# Patient Record
Sex: Female | Born: 1935 | Race: White | Hispanic: No | Smoking: Never smoker
Health system: Southern US, Community
[De-identification: ages and names within clinical notes are randomized; demographics above are authoritative.]

## PROBLEM LIST (undated history)

## (undated) DIAGNOSIS — I10 Essential (primary) hypertension: Secondary | ICD-10-CM

## (undated) DIAGNOSIS — I2510 Atherosclerotic heart disease of native coronary artery without angina pectoris: Secondary | ICD-10-CM

## (undated) DIAGNOSIS — N289 Disorder of kidney and ureter, unspecified: Secondary | ICD-10-CM

## (undated) DIAGNOSIS — E785 Hyperlipidemia, unspecified: Secondary | ICD-10-CM

## (undated) HISTORY — PX: STENT PLACEMENT RT URETER (ARMC HX): HXRAD1255

## (undated) HISTORY — PX: FRACTURE SURGERY: SHX138

## (undated) HISTORY — PX: CORONARY STENT PLACEMENT: SHX1402

---

## 2012-11-12 DIAGNOSIS — N3941 Urge incontinence: Secondary | ICD-10-CM | POA: Insufficient documentation

## 2012-11-12 DIAGNOSIS — N390 Urinary tract infection, site not specified: Secondary | ICD-10-CM | POA: Insufficient documentation

## 2012-11-12 DIAGNOSIS — N200 Calculus of kidney: Secondary | ICD-10-CM | POA: Insufficient documentation

## 2014-09-19 ENCOUNTER — Inpatient Hospital Stay
Admission: EM | Admit: 2014-09-19 | Discharge: 2014-09-25 | DRG: 871 | Disposition: A | Discharge status: Home / Self Care | Payer: Medicare Other | Attending: Internal Medicine | Admitting: Internal Medicine

## 2014-09-19 ENCOUNTER — Encounter: Payer: Self-pay | Admitting: Internal Medicine

## 2014-09-19 ENCOUNTER — Emergency Department: Payer: Medicare Other

## 2014-09-19 DIAGNOSIS — N3000 Acute cystitis without hematuria: Secondary | ICD-10-CM | POA: Diagnosis present

## 2014-09-19 DIAGNOSIS — D693 Immune thrombocytopenic purpura: Secondary | ICD-10-CM | POA: Diagnosis present

## 2014-09-19 DIAGNOSIS — D61818 Other pancytopenia: Secondary | ICD-10-CM | POA: Diagnosis present

## 2014-09-19 DIAGNOSIS — E871 Hypo-osmolality and hyponatremia: Secondary | ICD-10-CM | POA: Diagnosis present

## 2014-09-19 DIAGNOSIS — E0590 Thyrotoxicosis, unspecified without thyrotoxic crisis or storm: Secondary | ICD-10-CM | POA: Diagnosis present

## 2014-09-19 DIAGNOSIS — R350 Frequency of micturition: Secondary | ICD-10-CM | POA: Diagnosis not present

## 2014-09-19 DIAGNOSIS — I2510 Atherosclerotic heart disease of native coronary artery without angina pectoris: Secondary | ICD-10-CM | POA: Diagnosis present

## 2014-09-19 DIAGNOSIS — R0600 Dyspnea, unspecified: Secondary | ICD-10-CM

## 2014-09-19 DIAGNOSIS — I10 Essential (primary) hypertension: Secondary | ICD-10-CM | POA: Diagnosis present

## 2014-09-19 DIAGNOSIS — N170 Acute kidney failure with tubular necrosis: Secondary | ICD-10-CM | POA: Diagnosis present

## 2014-09-19 DIAGNOSIS — Z880 Allergy status to penicillin: Secondary | ICD-10-CM

## 2014-09-19 DIAGNOSIS — D696 Thrombocytopenia, unspecified: Secondary | ICD-10-CM

## 2014-09-19 DIAGNOSIS — E876 Hypokalemia: Secondary | ICD-10-CM | POA: Diagnosis present

## 2014-09-19 DIAGNOSIS — A419 Sepsis, unspecified organism: Secondary | ICD-10-CM | POA: Diagnosis present

## 2014-09-19 DIAGNOSIS — I5033 Acute on chronic diastolic (congestive) heart failure: Secondary | ICD-10-CM | POA: Diagnosis not present

## 2014-09-19 DIAGNOSIS — Z955 Presence of coronary angioplasty implant and graft: Secondary | ICD-10-CM | POA: Diagnosis not present

## 2014-09-19 DIAGNOSIS — I351 Nonrheumatic aortic (valve) insufficiency: Secondary | ICD-10-CM | POA: Diagnosis not present

## 2014-09-19 DIAGNOSIS — E861 Hypovolemia: Secondary | ICD-10-CM | POA: Diagnosis present

## 2014-09-19 DIAGNOSIS — N179 Acute kidney failure, unspecified: Secondary | ICD-10-CM

## 2014-09-19 DIAGNOSIS — Z87440 Personal history of urinary (tract) infections: Secondary | ICD-10-CM

## 2014-09-19 DIAGNOSIS — N390 Urinary tract infection, site not specified: Secondary | ICD-10-CM | POA: Diagnosis present

## 2014-09-19 DIAGNOSIS — J80 Acute respiratory distress syndrome: Secondary | ICD-10-CM | POA: Diagnosis not present

## 2014-09-19 DIAGNOSIS — R509 Fever, unspecified: Secondary | ICD-10-CM | POA: Diagnosis not present

## 2014-09-19 DIAGNOSIS — I5031 Acute diastolic (congestive) heart failure: Secondary | ICD-10-CM

## 2014-09-19 HISTORY — DX: Essential (primary) hypertension: I10

## 2014-09-19 HISTORY — DX: Atherosclerotic heart disease of native coronary artery without angina pectoris: I25.10

## 2014-09-19 LAB — CBC WITH DIFFERENTIAL/PLATELET
Basophils Absolute: 0 10*3/uL (ref 0–0.1)
EOS ABS: 0 10*3/uL (ref 0–0.7)
Eosinophils Relative: 0 %
HCT: 36 % (ref 35.0–47.0)
Hemoglobin: 12.2 g/dL (ref 12.0–16.0)
LYMPHS ABS: 0.2 10*3/uL — AB (ref 1.0–3.6)
Lymphocytes Relative: 8 %
MCH: 32.5 pg (ref 26.0–34.0)
MCHC: 33.9 g/dL (ref 32.0–36.0)
MCV: 96.1 fL (ref 80.0–100.0)
MONO ABS: 0.2 10*3/uL (ref 0.2–0.9)
Neutro Abs: 2.3 10*3/uL (ref 1.4–6.5)
Platelets: 70 10*3/uL — ABNORMAL LOW (ref 150–440)
RBC: 3.75 MIL/uL — AB (ref 3.80–5.20)
RDW: 14.6 % — ABNORMAL HIGH (ref 11.5–14.5)
WBC: 2.7 10*3/uL — ABNORMAL LOW (ref 3.6–11.0)

## 2014-09-19 LAB — COMPREHENSIVE METABOLIC PANEL
ALT: 65 U/L — AB (ref 14–54)
AST: 109 U/L — ABNORMAL HIGH (ref 15–41)
Albumin: 3.5 g/dL (ref 3.5–5.0)
Alkaline Phosphatase: 69 U/L (ref 38–126)
Anion gap: 8 (ref 5–15)
BUN: 40 mg/dL — ABNORMAL HIGH (ref 6–20)
CO2: 25 mmol/L (ref 22–32)
Calcium: 8.7 mg/dL — ABNORMAL LOW (ref 8.9–10.3)
Chloride: 95 mmol/L — ABNORMAL LOW (ref 101–111)
Creatinine, Ser: 1.6 mg/dL — ABNORMAL HIGH (ref 0.44–1.00)
GFR calc Af Amer: 30 mL/min — ABNORMAL LOW (ref >60)
GFR calc non Af Amer: 26 mL/min — ABNORMAL LOW (ref >60)
GLUCOSE: 116 mg/dL — AB (ref 65–99)
POTASSIUM: 4.4 mmol/L (ref 3.5–5.1)
SODIUM: 128 mmol/L — AB (ref 135–145)
Total Bilirubin: 0.6 mg/dL (ref 0.3–1.2)
Total Protein: 6.8 g/dL (ref 6.5–8.1)

## 2014-09-19 LAB — URINALYSIS COMPLETE WITH MICROSCOPIC (ARMC ONLY)
Bilirubin Urine: NEGATIVE
Glucose, UA: NEGATIVE mg/dL
KETONES UR: NEGATIVE mg/dL
NITRITE: NEGATIVE
PROTEIN: 100 mg/dL — AB
SPECIFIC GRAVITY, URINE: 1.013 (ref 1.005–1.030)
pH: 6 (ref 5.0–8.0)

## 2014-09-19 MED ORDER — SODIUM CHLORIDE 0.9 % IV BOLUS (SEPSIS)
1000.0000 mL | Freq: Once | INTRAVENOUS | Status: AC
  Administered 2014-09-19: 1000 mL via INTRAVENOUS

## 2014-09-19 MED ORDER — CEFTRIAXONE SODIUM IN DEXTROSE 20 MG/ML IV SOLN
1.0000 g | INTRAVENOUS | Status: DC
  Administered 2014-09-19: 1 g via INTRAVENOUS
  Filled 2014-09-19: qty 50

## 2014-09-19 MED ORDER — ONDANSETRON HCL 4 MG/2ML IJ SOLN: 4.0000 mg | Freq: Four times a day (QID) | INTRAMUSCULAR | Status: DC | PRN

## 2014-09-19 MED ORDER — ACETAMINOPHEN 650 MG RE SUPP: 650.0000 mg | Freq: Four times a day (QID) | RECTAL | Status: DC | PRN

## 2014-09-19 MED ORDER — CEFTRIAXONE SODIUM IN DEXTROSE 20 MG/ML IV SOLN
INTRAVENOUS | Status: AC
  Filled 2014-09-19: qty 50

## 2014-09-19 MED ORDER — ACETAMINOPHEN 325 MG PO TABS
ORAL_TABLET | ORAL | Status: AC
  Administered 2014-09-19: 650 mg via ORAL
  Filled 2014-09-19: qty 2

## 2014-09-19 MED ORDER — SODIUM CHLORIDE 0.9 % IV BOLUS (SEPSIS)
1000.0000 mL | INTRAVENOUS | Status: AC
  Administered 2014-09-19: 1000 mL via INTRAVENOUS

## 2014-09-19 MED ORDER — MORPHINE SULFATE 2 MG/ML IJ SOLN: 2.0000 mg | INTRAMUSCULAR | Status: DC | PRN

## 2014-09-19 MED ORDER — ACETAMINOPHEN 325 MG PO TABS
650.0000 mg | ORAL_TABLET | Freq: Once | ORAL | Status: AC
  Administered 2014-09-19: 650 mg via ORAL

## 2014-09-19 MED ORDER — CEFTRIAXONE SODIUM IN DEXTROSE 40 MG/ML IV SOLN
2.0000 g | Freq: Once | INTRAVENOUS | Status: AC
  Administered 2014-09-19: 2 g via INTRAVENOUS

## 2014-09-19 MED ORDER — ACETAMINOPHEN 325 MG PO TABS
650.0000 mg | ORAL_TABLET | Freq: Four times a day (QID) | ORAL | Status: DC | PRN
  Administered 2014-09-20: 650 mg via ORAL
  Administered 2014-09-22: 14:00:00 325 mg via ORAL
  Filled 2014-09-19: qty 2

## 2014-09-19 MED ORDER — SODIUM CHLORIDE 0.9 % IV SOLN
INTRAVENOUS | Status: DC
  Administered 2014-09-20 – 2014-09-21 (×4): via INTRAVENOUS

## 2014-09-19 MED ORDER — HEPARIN SODIUM (PORCINE) 5000 UNIT/ML IJ SOLN: 5000.0000 [IU] | Freq: Three times a day (TID) | INTRAMUSCULAR | Status: DC

## 2014-09-19 MED ORDER — ONDANSETRON HCL 4 MG PO TABS: 4.0000 mg | ORAL_TABLET | Freq: Four times a day (QID) | ORAL | Status: DC | PRN

## 2014-09-19 MED ORDER — CEFUROXIME AXETIL 500 MG PO TABS: 500.0000 mg | ORAL_TABLET | Freq: Two times a day (BID) | ORAL | Status: DC

## 2014-09-19 MED ORDER — CEFTRIAXONE SODIUM IN DEXTROSE 40 MG/ML IV SOLN
2.0000 g | INTRAVENOUS | Status: DC
  Filled 2014-09-19: qty 50

## 2014-09-19 NOTE — ED Notes (Signed)
Son reports that yesterday patient "collapsed" reports checked by ems and vital signs were good so did not get checked out.  Today pt "collapsed" again and increased weakness.  Pt with history of chronic bladder infections.

## 2014-09-19 NOTE — H&P (Signed)
[facility] at [place] NAME: Sandy Roberson    MR#:  [id number]  DATE OF BIRTH:  [birth year]   DATE OF ADMISSION:  09/19/2014  PRIMARY CARE PHYSICIAN: ZUST, DON L, DO   REQUESTING/REFERRING PHYSICIAN: [place]  CHIEF COMPLAINT:   Chief Complaint  Patient presents with  . Near Syncope    HISTORY OF PRESENT ILLNESS:  Sandy Roberson  is a 81 y.o. female with a known history of coronary artery disease status post PCI stent placement presenting with weakness. He describes three-day duration weakness with associated subjective fever/chills, increased urinary frequency. On arrival to emergency department noted to be febrile MAXIMUM TEMPERATURE 103. Emergency department course: Code sepsis initiated  PAST MEDICAL HISTORY:   Past Medical History  Diagnosis Date  . Coronary artery disease   . Hypertension     PAST SURGICAL HISTORY:  History reviewed. No pertinent past surgical history.  SOCIAL HISTORY:   History  Substance Use Topics  . Smoking status: Never Smoker   . Smokeless tobacco: Not on file  . Alcohol Use: No    FAMILY HISTORY:   Family History  Problem Relation Age of Onset  . Heart failure Neg Hx     DRUG ALLERGIES:   Allergies  Allergen Reactions  . Penicillins Rash    REVIEW OF SYSTEMS:  REVIEW OF SYSTEMS:  CONSTITUTIONAL: Positive fevers, chills, fatigue, weakness.  EYES: Denies blurred vision, double vision, or eye pain.  EARS, NOSE, THROAT: Denies tinnitus, ear pain, hearing loss.  RESPIRATORY: denies cough, shortness of breath, wheezing  CARDIOVASCULAR: Denies chest pain, palpitations, edema.  GASTROINTESTINAL: Denies nausea, vomiting, diarrhea, abdominal pain.  GENITOURINARY: Denies dysuria, hematuria. Positive increased urinary frequency ENDOCRINE: Denies nocturia or thyroid problems. HEMATOLOGIC AND LYMPHATIC: Denies easy bruising or bleeding.  SKIN: Denies rash or lesions.   MUSCULOSKELETAL: Denies pain in neck, back, shoulder, knees, hips, or further arthritic symptoms.  NEUROLOGIC: Denies paralysis, paresthesias.  PSYCHIATRIC: Denies anxiety or depressive symptoms. Otherwise full review of systems performed by me is negative.   MEDICATIONS AT HOME:   Prior to Admission medications   Medication Sig Start Date End Date Taking? Authorizing Provider  calcitRIOL (ROCALTROL) 0.25 MCG capsule Take 1 capsule by mouth daily. 10/20/12  Yes Historical Provider, MD  nebivolol (BYSTOLIC) 10 MG tablet Take 10 mg by mouth daily. 08/25/14  Yes Historical Provider, MD  aspirin EC 81 MG tablet Take 1 tablet by mouth daily.    Historical Provider, MD  losartan (COZAAR) 100 MG tablet Take 1 tablet by mouth daily.    Historical Provider, MD  methimazole (TAPAZOLE) 5 MG tablet Take 1 tablet by mouth daily.    Historical Provider, MD  simvastatin (ZOCOR) 40 MG tablet Take 1 tablet by mouth at bedtime.    Historical Provider, MD      VITAL SIGNS:  Blood pressure 83/44, pulse 67, temperature 98.2 F (36.8 C), temperature source Oral, resp. rate 18, height 5\' 2"  (1.575 m), weight 131 lb 9 oz (59.676 kg), SpO2 92 %.  PHYSICAL EXAMINATION:  VITAL SIGNS: Filed Vitals:   09/19/14 2317  BP: 83/44  Pulse: 67  Temp: 98.2 F (36.8 C)  Resp: 18   GENERAL:81 y.o.female currently in no acute distress.  HEAD: Normocephalic, atraumatic.  EYES: Pupils equal, round, reactive to light. Extraocular muscles intact. No scleral icterus.  MOUTH: Moist mucosal membrane. Dentition intact. No abscess noted.  EAR, NOSE, THROAT: Clear without exudates. No external lesions.  NECK: Supple.  No thyromegaly. No nodules. No JVD.  PULMONARY: Clear to ascultation, without wheeze rails or rhonci. No use of accessory muscles, Good respiratory effort. good air entry bilaterally CHEST: Nontender to palpation.  CARDIOVASCULAR: S1 and S2. Regular rate and rhythm. No murmurs, rubs, or gallops. No edema. Pedal  pulses 2+ bilaterally.  GASTROINTESTINAL: Soft, nontender, nondistended. No masses. Positive bowel sounds. No hepatosplenomegaly.  MUSCULOSKELETAL: No swelling, clubbing, or edema. Range of motion full in all extremities.  NEUROLOGIC: Cranial nerves II through XII are intact. No gross focal neurological deficits. Sensation intact. Reflexes intact.  SKIN: No ulceration, lesions, rashes, or cyanosis. Skin warm and dry. Turgor intact.  PSYCHIATRIC: Mood, affect within normal limits. The patient is awake, alert and oriented x 3. Insight, judgment intact.    LABORATORY PANEL:   CBC  Recent Labs Lab 09/19/14 1935  WBC 2.7*  HGB 12.2  HCT 36.0  PLT 70*   ------------------------------------------------------------------------------------------------------------------  Chemistries   Recent Labs Lab 09/19/14 1935  NA 128*  K 4.4  CL 95*  CO2 25  GLUCOSE 116*  BUN 40*  CREATININE 1.60*  CALCIUM 8.7*  AST 109*  ALT 65*  ALKPHOS 69  BILITOT 0.6   ------------------------------------------------------------------------------------------------------------------  Cardiac Enzymes No results for input(s): TROPONINI in the last 168 hours. ------------------------------------------------------------------------------------------------------------------  RADIOLOGY:  Dg Chest Port 1 View  09/19/2014   CLINICAL DATA:  Confusion and weakness.  EXAM: PORTABLE CHEST - 1 VIEW  COMPARISON:  None.  FINDINGS: The heart is enlarged. There is tortuosity and ectasia of the thoracic aorta. The upper mediastinal contours are quite widened and there is a deviation of the trachea right would. Findings most likely due to thyroid goiter. Recommend correlation with clinical findings. The lungs are clear. No pleural effusion. The bony thorax is intact.  IMPRESSION: Cardiac enlargement.  No acute pulmonary findings.  Probable thyroid goiter.   Electronically Signed   By: Marijo Sanes M.D.   On: 09/19/2014  20:39    EKG:  No orders found for this or any previous visit.  IMPRESSION AND PLAN:   81 year old Caucasian female history of coronary artery disease status post PCI stent placement presenting with generalized weakness 2 days total duration.  1.Sepsis, meeting septic criteria by temperature, the wbc present on arrival. Source urinary Code sepsis initiated. Panculture. Broad-spectrum antibiotics including ceftriaxone and taper antibiotics when culture data returns. He has received a 30 mL/kg IV fluid bolus. Continue IV fluid hydration to keep mean arterial pressure greater than 65. sHe may require pressor therapy if blood pressure worsens. We will repeat lactic acid given the initial is greater than 2.2.   2. Hyponatremia: IV fluid hydration with normal saline follow sodium levels, prioritize blood pressure over sodium 3. Essential hypertension: Continue losartan by systolic 4. Coronary artery disease without angina: Continue aspirin losartan Zocor 5.Venous thromboembolism prophylactic: SCD      All the records are reviewed and case discussed with ED provider. Management plans discussed with the patient, family and they are in agreement.  CODE STATUS: Full  TOTAL TIME TAKING CARE OF THIS PATIENT: 45 minutes.    Arthella Headings,  Karenann Cai.D on 09/19/2014 at 11:38 PM  Between 7am to 6pm - Pager - [phone]  After 6pm: House Pager: - [phone]  [facility]  Office  [phone]  CC: Primary care physician; ZUST, DON L, DO

## 2014-09-19 NOTE — ED Provider Notes (Signed)
[facility] Emergency Department Provider Note  ____________________________________________  Time seen: Approximately 8:15 PM  I have reviewed the triage vital signs and the nursing notes.   HISTORY  Chief Complaint Near Syncope    HPI Sandy Roberson is a 81 y.o. female with a history of urinary tract infections who presents today with fever today. Patient has had weakness for the past 2 days and per her son passed out yesterday in his arms. She has not fallen or hit her head. She's had similar presentations with urinary tract infections in the past. She does wear a depends. He is not complaining of any pain or urinary frequency at this time. Also denying cough, chest pain and shortness of breath.Does have a history of sepsis from UTI.   No past medical history on file.  There are no active problems to display for this patient.   No past surgical history on file.  No current outpatient prescriptions on file.  Allergies Penicillins  No family history on file.  Social History History  Substance Use Topics  . Smoking status: Not on file  . Smokeless tobacco: Not on file  . Alcohol Use: Not on file    Review of Systems Constitutional: No fever/chills Eyes: No visual changes. ENT: No sore throat. Cardiovascular: Denies chest pain. Respiratory: Denies shortness of breath. Gastrointestinal: No abdominal pain.  No nausea, no vomiting.  No diarrhea.  No constipation. Genitourinary: Negative for dysuria. Musculoskeletal: Negative for back pain. Skin: Negative for rash. Neurological: Negative for headaches, focal weakness or numbness.  10-point ROS otherwise negative.  ____________________________________________   PHYSICAL EXAM:  VITAL SIGNS: ED Triage Vitals  Enc Vitals Group     BP 09/19/14 1920 118/83 mmHg     Pulse Rate 09/19/14 1920 87     Resp 09/19/14 1920 18     Temp 09/19/14 1920 101.9 F (38.8 C)     Temp Source  09/19/14 1920 Oral     SpO2 09/19/14 1920 91 %     Weight 09/19/14 1920 112 lb (50.803 kg)     Height 09/19/14 1920 5\' 2"  (1.575 m)     Head Cir --      Peak Flow --      Pain Score --      Pain Loc --      Pain Edu? --      Excl. in [place]? --     Constitutional: Alert and oriented. Well appearing and in no acute distress. Eyes: Conjunctivae are normal. PERRL. EOMI. Head: Atraumatic. Nose: No congestion/rhinnorhea. Mouth/Throat: Mucous membranes are moist.  Oropharynx non-erythematous. Neck: No stridor.   Cardiovascular: Normal rate, regular rhythm. Grossly normal heart sounds.  Good peripheral circulation. Respiratory: Normal respiratory effort.  No retractions. Lungs CTAB. Gastrointestinal: Soft and nontender. No distention. No abdominal bruits. No CVA tenderness. Musculoskeletal: No lower extremity tenderness nor edema.  No joint effusions. Neurologic:  Normal speech and language. No gross focal neurologic deficits are appreciated. Speech is normal. No gait instability. Skin:  Skin is warm, dry and intact. No rash noted. Psychiatric: Mood and affect are normal. Speech and behavior are normal.  ____________________________________________   LABS (all labs ordered are listed, but only abnormal results are displayed)  Labs Reviewed  CBC WITH DIFFERENTIAL/PLATELET - Abnormal; Notable for the following:    WBC 2.7 (*)    RBC 3.75 (*)    RDW 14.6 (*)    Platelets 70 (*)    Lymphs Abs 0.2 (*)  All other components within normal limits  COMPREHENSIVE METABOLIC PANEL - Abnormal; Notable for the following:    Sodium 128 (*)    Chloride 95 (*)    Glucose, Bld 116 (*)    BUN 40 (*)    Creatinine, Ser 1.60 (*)    Calcium 8.7 (*)    AST 109 (*)    ALT 65 (*)    GFR calc non Af Amer 26 (*)    GFR calc Af Amer 30 (*)    All other components within normal limits  URINALYSIS COMPLETEWITH MICROSCOPIC ([facility] ONLY) - Abnormal; Notable for the following:    Color, Urine YELLOW (*)     APPearance HAZY (*)    Hgb urine dipstick 2+ (*)    Protein, ur 100 (*)    Leukocytes, UA 2+ (*)    Bacteria, UA MANY (*)    Squamous Epithelial / LPF 0-5 (*)    All other components within normal limits  CULTURE, BLOOD (ROUTINE X 2)  CULTURE, BLOOD (ROUTINE X 2)  URINE CULTURE   ____________________________________________  EKG  ED ECG REPORT I, Doran Stabler, the attending physician, personally viewed and interpreted this ECG.   Date: 09/19/2014  EKG Time: 2119  Rate: 75  Rhythm: normal EKG, normal sinus rhythm  Axis: Noble axis  Intervals:none  ST&T Change: No ST elevation or depressions. No abnormal T-wave inversions.  ____________________________________________  RADIOLOGY  No acute pulmonary findings ____________________________________________   PROCEDURES    ____________________________________________   INITIAL IMPRESSION / ASSESSMENT AND PLAN / ED COURSE  Pertinent labs & imaging results that were available during my care of the patient were reviewed by me and considered in my medical decision making (see chart for details).  ----------------------------------------- 10:29 PM on 09/19/2014 -----------------------------------------  Asian fever improving but blood pressure now decreased to the 80s. Patient still resting comfortably. Concerned about sepsis. Made patient a sepsis alert and will give IV antibiotics. We'll image the hospital. Signed out to Dr. Lavetta Nielsen at this time. ____________________________________________   FINAL CLINICAL IMPRESSION(S) / ED DIAGNOSES  Urinary tract infection. Sepsis. Acute, return visit.    Orbie Pyo, MD 09/19/14 2229

## 2014-09-19 NOTE — ED Notes (Signed)
Pt's blood pressure has been steadily dropping. Her temp is down. Not tachycardic. Dr is changing her back to a sepsis protocol and will be admitting her.

## 2014-09-20 LAB — BASIC METABOLIC PANEL
ANION GAP: 4 — AB (ref 5–15)
BUN: 34 mg/dL — ABNORMAL HIGH (ref 6–20)
CHLORIDE: 106 mmol/L (ref 101–111)
CO2: 25 mmol/L (ref 22–32)
CREATININE: 1.52 mg/dL — AB (ref 0.44–1.00)
Calcium: 7.6 mg/dL — ABNORMAL LOW (ref 8.9–10.3)
GFR calc non Af Amer: 28 mL/min — ABNORMAL LOW (ref >60)
GFR, EST AFRICAN AMERICAN: 32 mL/min — AB (ref >60)
GLUCOSE: 91 mg/dL (ref 65–99)
POTASSIUM: 4.2 mmol/L (ref 3.5–5.1)
Sodium: 135 mmol/L (ref 135–145)

## 2014-09-20 LAB — CBC
HCT: 33.2 % — ABNORMAL LOW (ref 35.0–47.0)
HEMOGLOBIN: 11 g/dL — AB (ref 12.0–16.0)
MCH: 32 pg (ref 26.0–34.0)
MCHC: 33.1 g/dL (ref 32.0–36.0)
MCV: 96.8 fL (ref 80.0–100.0)
PLATELETS: 54 10*3/uL — AB (ref 150–440)
RBC: 3.43 MIL/uL — ABNORMAL LOW (ref 3.80–5.20)
RDW: 14.5 % (ref 11.5–14.5)
WBC: 1.9 10*3/uL — ABNORMAL LOW (ref 3.6–11.0)

## 2014-09-20 LAB — T4, FREE: FREE T4: 0.99 ng/dL (ref 0.61–1.12)

## 2014-09-20 LAB — TSH: TSH: 1.951 u[IU]/mL (ref 0.350–4.500)

## 2014-09-20 MED ORDER — ASPIRIN EC 81 MG PO TBEC
81.0000 mg | DELAYED_RELEASE_TABLET | Freq: Every day | ORAL | Status: DC
  Administered 2014-09-20 – 2014-09-21 (×2): 81 mg via ORAL
  Filled 2014-09-20 (×2): qty 1

## 2014-09-20 MED ORDER — METHIMAZOLE 10 MG PO TABS
5.0000 mg | ORAL_TABLET | Freq: Every day | ORAL | Status: DC
  Administered 2014-09-20 – 2014-09-23 (×4): 5 mg via ORAL
  Administered 2014-09-24: 10 mg via ORAL
  Administered 2014-09-25: 5 mg via ORAL
  Filled 2014-09-20 (×6): qty 1

## 2014-09-20 MED ORDER — CALCITRIOL 0.25 MCG PO CAPS
0.2500 ug | ORAL_CAPSULE | Freq: Every day | ORAL | Status: DC
  Administered 2014-09-20 – 2014-09-25 (×6): 0.25 ug via ORAL
  Filled 2014-09-20 (×6): qty 1

## 2014-09-20 MED ORDER — SIMVASTATIN 40 MG PO TABS
40.0000 mg | ORAL_TABLET | Freq: Every day | ORAL | Status: DC
  Administered 2014-09-20 – 2014-09-24 (×5): 40 mg via ORAL
  Filled 2014-09-20 (×5): qty 1

## 2014-09-20 MED ORDER — IBUPROFEN 600 MG PO TABS
600.0000 mg | ORAL_TABLET | Freq: Four times a day (QID) | ORAL | Status: DC | PRN
  Administered 2014-09-20: 19:00:00 600 mg via ORAL
  Filled 2014-09-20: qty 1

## 2014-09-20 MED ORDER — CEFTRIAXONE SODIUM IN DEXTROSE 20 MG/ML IV SOLN
1.0000 g | INTRAVENOUS | Status: DC
  Administered 2014-09-20 – 2014-09-21 (×2): 1 g via INTRAVENOUS
  Filled 2014-09-20 (×3): qty 50

## 2014-09-20 NOTE — Progress Notes (Signed)
Pt transferred to 1C, rm 101, report called to [facility].  Pt alert and oriented, no c/o pain or SOB.  All belongings and chart sent w/pt.

## 2014-09-20 NOTE — Progress Notes (Signed)
On call MD notified of pt BP 93/40 with IVF going at 171mL/hr. No new orders received. Will continue to assess.   Hiram Gash [employer]

## 2014-09-20 NOTE — Progress Notes (Signed)
ANTIBIOTIC CONSULT NOTE - INITIAL  Pharmacy Consult for ceftriaxone dosing Indication: UTI  Allergies  Allergen Reactions  . Penicillins Rash    Patient Measurements: Height: 5\' 2"  (157.5 cm) Weight: 131 lb 9 oz (59.676 kg) IBW/kg (Calculated) : 50.1 Adjusted Body Weight: n/a  Vital Signs: Temp: 98.2 F (36.8 C) (06/05 2317) Temp Source: Oral (06/05 2317) BP: 83/44 mmHg (06/05 2317) Pulse Rate: 67 (06/05 2317) Intake/Output from previous day:   Intake/Output from this shift:    Labs:  Recent Labs  09/19/14 1935  WBC 2.7*  HGB 12.2  PLT 70*  CREATININE 1.60*   Estimated Creatinine Clearance: 15.9 mL/min (by C-G formula based on Cr of 1.6). No results for input(s): VANCOTROUGH, VANCOPEAK, VANCORANDOM, GENTTROUGH, GENTPEAK, GENTRANDOM, TOBRATROUGH, TOBRAPEAK, TOBRARND, AMIKACINPEAK, AMIKACINTROU, AMIKACIN in the last 72 hours.   Microbiology: No results found for this or any previous visit (from the past 720 hour(s)).  Medical History: Past Medical History  Diagnosis Date  . Coronary artery disease   . Hypertension     Medications:   Assessment: Blood and urine cx pending UA: NO2(-)  LE(+)  WBC TNTC  Goal of Therapy:  Resolution of infection   Plan:  Ceftriaxone 2 grams q 24 hours ordered.  Hyla Coard S 09/20/2014,12:11 AM

## 2014-09-20 NOTE — Progress Notes (Signed)
[facility] at [place] NAME: Sandy Roberson    MR#:  [id number]  DATE OF BIRTH:  Sep 18, 1915  SUBJECTIVE:  CHIEF COMPLAINT:   Chief Complaint  Patient presents with  . Near Syncope  - and admitted for sepsis, likely secondary to urinary tract infection. -Was hypotensive on admission, received IV fluids. Blood pressure is much better. Hasn't required any vasopressors. -Patient is alert and oriented. Low-grade fevers this morning.  REVIEW OF SYSTEMS:  Review of Systems  Constitutional: Positive for fever. Negative for chills.  Respiratory: Negative for cough, shortness of breath and wheezing.   Cardiovascular: Negative for chest pain and palpitations.  Gastrointestinal: Negative for nausea, vomiting, abdominal pain, diarrhea and constipation.  Genitourinary: Negative for dysuria.  Neurological: Negative for dizziness, seizures and headaches.    DRUG ALLERGIES:   Allergies  Allergen Reactions  . Penicillins Rash    VITALS:  Blood pressure 121/44, pulse 76, temperature 100.5 F (38.1 C), temperature source Oral, resp. rate 18, height 5\' 2"  (1.575 m), weight 59.676 kg (131 lb 9 oz), SpO2 94 %.  PHYSICAL EXAMINATION:  Physical Exam  GENERAL:  81 y.o.-year-old patient lying in the bed with no acute distress.  EYES: Pupils equal, round, reactive to light and accommodation. No scleral icterus. Extraocular muscles intact.  HEENT: Head atraumatic, normocephalic. Oropharynx and nasopharynx clear.  NECK:  Supple, no jugular venous distention. No thyroid enlargement, no tenderness.  LUNGS: Normal breath sounds bilaterally, decreased bibasilar breath sounds. no wheezing, rales,rhonchi or crepitation. No use of accessory muscles of respiration.  CARDIOVASCULAR: S1, S2 normal. 3/6 systolic murmur is present, no  rubs, or gallops.  ABDOMEN: Soft, nontender, nondistended. Bowel sounds present. No organomegaly or mass.  EXTREMITIES: No  pedal edema, cyanosis, or clubbing.  NEUROLOGIC: Cranial nerves II through XII are intact. Muscle strength 5/5 in all extremities. Sensation intact. Gait not checked.  PSYCHIATRIC: The patient is alert and oriented x 2.  SKIN: No obvious rash, lesion, or ulcer.    LABORATORY PANEL:   CBC  Recent Labs Lab 09/20/14 0646  WBC 1.9*  HGB 11.0*  HCT 33.2*  PLT 54*   ------------------------------------------------------------------------------------------------------------------  Chemistries   Recent Labs Lab 09/19/14 1935 09/20/14 0646  NA 128* 135  K 4.4 4.2  CL 95* 106  CO2 25 25  GLUCOSE 116* 91  BUN 40* 34*  CREATININE 1.60* 1.52*  CALCIUM 8.7* 7.6*  AST 109*  --   ALT 65*  --   ALKPHOS 69  --   BILITOT 0.6  --    ------------------------------------------------------------------------------------------------------------------  Cardiac Enzymes No results for input(s): TROPONINI in the last 168 hours. ------------------------------------------------------------------------------------------------------------------  RADIOLOGY:  Dg Chest Port 1 View  09/19/2014   CLINICAL DATA:  Confusion and weakness.  EXAM: PORTABLE CHEST - 1 VIEW  COMPARISON:  None.  FINDINGS: The heart is enlarged. There is tortuosity and ectasia of the thoracic aorta. The upper mediastinal contours are quite widened and there is a deviation of the trachea right would. Findings most likely due to thyroid goiter. Recommend correlation with clinical findings. The lungs are clear. No pleural effusion. The bony thorax is intact.  IMPRESSION: Cardiac enlargement.  No acute pulmonary findings.  Probable thyroid goiter.   Electronically Signed   By: Sandy Roberson M.D.   On: 09/19/2014 20:39    EKG:   Orders placed or performed during the hospital encounter of 09/19/14  . EKG 12-Lead  . EKG 12-Lead    ASSESSMENT  AND PLAN:   81 year old female with past medical history significant for coronary artery  disease and hypertension, who lives at home was brought in secondary to weakness and hypotension.  #1 sepsis-likely source acute cystitis. Blood and urine cultures have been ordered. -Continue on Rocephin for now. Still spiking fevers, but only low-grade today. -IV fluids, no pressors needed. -We'll transfer out of ICU today.  #2 hyponatremia secondary to hypovolemia. Improved with IV fluids at this time.  #3 hypertension as-as blood pressure is low, will hold off on giving her home medications. Continue to monitor.  #4 pancytopenia-drop noted mostly in WBC count and also platelets. Could be secondary to sepsis. Not on any heparin products at this time. We'll continue to monitor at this time. No acute indication for any transfusion. No active bleeding noted. Partly dilutional as well.  #5 acute renal failure-no baseline available. Continue to monitor. Could be ATN from sepsis.  #6 Hyperthyroidism-patient on methimazole as outpatient. We'll check TSH and T4.  #7 DVT prophylaxis - TEDs and SCDs.  Physical therapy has been ordered.  All the records are reviewed and case discussed with Care Management/Social Workerr. Management plans discussed with the patient, family and they are in agreement.  CODE STATUS: Full code  TOTAL CRITICAL CARE TIME SPENT IN TAKING CARE OF THIS PATIENT: 37 minutes.   POSSIBLE D/C IN 2 DAYS, DEPENDING ON CLINICAL CONDITION.   Sandy Roberson M.D on 09/20/2014 at 10:47 AM  Between 7am to 6pm - Pager - [phone]  After 6pm go to [email or web address] - password [email or web address] [facility]  Office  [phone]  CC: Primary care physician; Sandy Roberson

## 2014-09-20 NOTE — Plan of Care (Signed)
Problem: Discharge Progression Outcomes Goal: Discharge plan in place and appropriate Outcome: Progressing Individualization: Pt called [facility].  Lives w/son and his girlfriend.  Has chronic issues w/dehydration and bladder infections.  Recent falls r/t hypotension. Pt has Hx of CAD and HTN - controlled by home medications.  Being held currently b/c of hypotension.   Goal: Other Discharge Outcomes/Goals Outcome: Progressing Plan of Care Progress to Goal: Pt transferred today from CCU.  She's very hard of hearing.  High Fall risk b/c of 2 falls in last 2 days r/t hypotension.  Wked w/PT today And using walker.  A&O, not impulsive - will call out for bedpan.  Very poor PO intake.  Pt may need dietary consult. Son would also like order for wheelchair on d/c -

## 2014-09-20 NOTE — Evaluation (Signed)
Physical Therapy Evaluation Patient Details Name: Sandy Roberson MRN: [id number] DOB: 1915-11-30 Today's Date: 09/20/2014   History of Present Illness  Pt is a 81 y.o. female admitted with 2 days of weakness, near syncope and 2 recent falls.  Pt admitted with sepsis and hypotension.  PMH:  CAD s/p PCI stent placement, htn.  Clinical Impression  Currently pt demonstrates impairments with activity tolerance, strength, balance, and limitations with functional mobility.  Prior to admission, pt was fairly independent using standard walker.  Pt lives with her son and son's girlfriend in 1 level home with no steps to enter.  Currently pt is CGA to min assist with transfers and ambulation with walker.  Pt would benefit from skilled PT to address above noted impairments and functional limitations.  Recommend pt discharge to home with 24/7 assist for functional mobility when medically appropriate (pt reports that either her son or son's girlfriend will be able to assist her with functional mobility).  Pt's standard walker appears to have been altered for use and anticipate pt would benefit from RW instead to conserve energy and for balance.     Follow Up Recommendations Home health PT;Supervision/Assistance - 24 hour    Equipment Recommendations  Rolling walker with 5" wheels    Recommendations for Other Services       Precautions / Restrictions Precautions Precautions: Fall Restrictions Weight Bearing Restrictions: No      Mobility  Bed Mobility Overal bed mobility: Needs Assistance Bed Mobility: Supine to Sit;Sit to Supine     Supine to sit: Supervision;HOB elevated Sit to supine: Supervision;HOB elevated   General bed mobility comments: increased time to perform on her own  Transfers Overall transfer level: Needs assistance Equipment used: Standard walker Transfers: Sit to/from Stand Sit to Stand: Min guard;Min assist         General transfer comment: assist to initiate  stand from bed and 2 attempts required to stand successfully  Ambulation/Gait Ambulation/Gait assistance: Min guard;Min assist Ambulation Distance (Feet): 30 Feet Assistive device: Rolling walker (2 wheeled)       General Gait Details: Pt appearing "shaky" with decreased cadence, decreased B step length/foot clearance/heelstrike  Stairs            Wheelchair Mobility    Modified Rankin (Stroke Patients Only)       Balance                                             Pertinent Vitals/Pain Pain Assessment: No/denies pain    Home Living Family/patient expects to be discharged to:: Private residence Living Arrangements: Children (Pt's son and son's girlfriend) Available Help at Discharge: Family   Home Access: Level entry     Home Layout: One level Home Equipment: Environmental consultant - standard;Cane - single point      Prior Function Level of Independence: Independent with assistive device(s)         Comments: uses standard walker at home; has assist for groceries and cleaning; independent with showering and laundry; pt reports 2 recent falls (one d/t slipping and not sure on reason for 2nd fall)     Hand Dominance        Extremity/Trunk Assessment   Upper Extremity Assessment: Generalized weakness           Lower Extremity Assessment: Generalized weakness         Communication  Communication: HOH  Cognition Arousal/Alertness: Awake/alert Behavior During Therapy: WFL for tasks assessed/performed Overall Cognitive Status: Within Functional Limits for tasks assessed                      General Comments  Nursing cleared pt for participation in physical therapy.  Pt agreeable to PT session.    Exercises        Assessment/Plan    PT Assessment Patient needs continued PT services  PT Diagnosis Difficulty walking;Generalized weakness   PT Problem List Decreased strength;Decreased activity tolerance;Decreased  balance;Decreased mobility  PT Treatment Interventions DME instruction;Gait training;Functional mobility training;Therapeutic activities;Therapeutic exercise;Balance training;Patient/family education   PT Goals (Current goals can be found in the Care Plan section) Acute Rehab PT Goals Patient Stated Goal: To go home PT Goal Formulation: With patient Time For Goal Achievement: 10/04/14 Potential to Achieve Goals: Good    Frequency Min 2X/week   Barriers to discharge        Co-evaluation               End of Session Equipment Utilized During Treatment: Gait belt Activity Tolerance: Patient limited by fatigue Patient left: in bed;with call bell/phone within reach;with bed alarm set           Time: [id number] PT Time Calculation (min) (ACUTE ONLY): 28 min   Charges:   PT Evaluation $Initial PT Evaluation Tier I: 1 Procedure     PT G CodesLeitha Bleak 09-26-14, 4:51 PM Leitha Bleak, [place]

## 2014-09-21 ENCOUNTER — Inpatient Hospital Stay: Payer: Medicare Other

## 2014-09-21 LAB — CBC
HEMATOCRIT: 30.2 % — AB (ref 35.0–47.0)
HEMOGLOBIN: 10.2 g/dL — AB (ref 12.0–16.0)
MCH: 32.6 pg (ref 26.0–34.0)
MCHC: 33.8 g/dL (ref 32.0–36.0)
MCV: 96.4 fL (ref 80.0–100.0)
PLATELETS: 41 10*3/uL — AB (ref 150–440)
RBC: 3.14 MIL/uL — ABNORMAL LOW (ref 3.80–5.20)
RDW: 14.6 % — ABNORMAL HIGH (ref 11.5–14.5)
WBC: 2.1 10*3/uL — ABNORMAL LOW (ref 3.6–11.0)

## 2014-09-21 LAB — BASIC METABOLIC PANEL
ANION GAP: 4 — AB (ref 5–15)
BUN: 31 mg/dL — ABNORMAL HIGH (ref 6–20)
CALCIUM: 7.2 mg/dL — AB (ref 8.9–10.3)
CO2: 24 mmol/L (ref 22–32)
Chloride: 109 mmol/L (ref 101–111)
Creatinine, Ser: 1.5 mg/dL — ABNORMAL HIGH (ref 0.44–1.00)
GFR calc Af Amer: 32 mL/min — ABNORMAL LOW (ref >60)
GFR, EST NON AFRICAN AMERICAN: 28 mL/min — AB (ref >60)
Glucose, Bld: 91 mg/dL (ref 65–99)
Potassium: 4 mmol/L (ref 3.5–5.1)
Sodium: 137 mmol/L (ref 135–145)

## 2014-09-21 LAB — URINE CULTURE: Culture: >=100000

## 2014-09-21 MED ORDER — IPRATROPIUM-ALBUTEROL 0.5-2.5 (3) MG/3ML IN SOLN
3.0000 mL | RESPIRATORY_TRACT | Status: DC | PRN
  Administered 2014-09-23 – 2014-09-24 (×2): 3 mL via RESPIRATORY_TRACT
  Filled 2014-09-21 (×2): qty 3

## 2014-09-21 MED ORDER — SODIUM CHLORIDE 0.9 % IV BOLUS (SEPSIS)
500.0000 mL | Freq: Once | INTRAVENOUS | Status: AC
  Administered 2014-09-21: 06:00:00 500 mL via INTRAVENOUS

## 2014-09-21 MED ORDER — HYDROCORTISONE NA SUCCINATE PF 100 MG IJ SOLR
50.0000 mg | Freq: Four times a day (QID) | INTRAMUSCULAR | Status: DC
  Administered 2014-09-21 – 2014-09-22 (×4): 50 mg via INTRAVENOUS
  Filled 2014-09-21 (×4): qty 2

## 2014-09-21 MED ORDER — FUROSEMIDE 10 MG/ML IJ SOLN
20.0000 mg | Freq: Once | INTRAMUSCULAR | Status: AC
  Administered 2014-09-21: 14:00:00 20 mg via INTRAVENOUS
  Filled 2014-09-21: qty 2

## 2014-09-21 NOTE — Progress Notes (Signed)
   09/21/14 0930  Clinical Encounter Type  Visited With Patient  Visit Type Initial  Visited with patient and the nurse.  No family present.  Patient said she was doing ok, but admitted that she is hard of hearing.  She said she hopes to go home soon.  Visit cut short by entry of the doctor who wanted to check on the patient.  I told patient I would try to visit with her later.  Patient and nurse both thanked me for coming.  [place] [phone]

## 2014-09-21 NOTE — Plan of Care (Signed)
Problem: Discharge Progression Outcomes Goal: Other Discharge Outcomes/Goals Outcome: Progressing 1. Pain:      Denies.   2. Hemodynamically:     Hypotensive. NS infusing at 138ml/hr. 3. Complications:      BP stable.      OOB to [facility]. Dyspnea on exertion. O2 sat 81% on RA. Placed on 2L O2 via [place]. O2 sat 93% on 2L O2 via       [place]. Inspiratory/Expiratory wheezing noted. MD paged regarding status. IVFs discontinued. Lasix 20mg       IVP given as ordered. Transported to radiology via bed for C-XRAY. Tolerated well.  4. Diet:     Tolerating Healthy Heart diet. Decreased appetite. 5. Activity:     Walker and 1 person assist.

## 2014-09-21 NOTE — Care Management (Signed)
Admitted to [facility] with the diagnosis of sepsis/urinary tract infection. Lives with son [name], [phone] or [phone]). Sees Dr Remus Loffler. States she has been see by Dr, Remus Loffler in the last year. No home health. No skilled facility. No home oxygen. Uses cane.rolling walker to aid in ambulation. States either her son or girlfriend are in the home at all times.  Physical therapy evaluation completed. Recommends home with home health 24 hour supervision. No home health agency preference. Shelbie Ammons RN MSN Care Management [phone]

## 2014-09-21 NOTE — Progress Notes (Signed)
[place] [facility] [place] NAME: Sandy Roberson    MR#:  [id number]  DATE OF BIRTH:  1915/10/12  SUBJECTIVE:  CHIEF COMPLAINT:   Chief Complaint  Patient presents with  . Near Syncope  - blood pressure remains low normal. The patient is not symptomatic. She did receive 500 cc fluid bolus overnight. This morning after getting out of bed to use bedside commode, she became extremely dyspneic short of breath and had audible wheezing. Currently resting in bed on 1-2 L nasal cannula. -Had a temperature of 10 22F last night  REVIEW OF SYSTEMS:  Review of Systems  Constitutional: Positive for fever. Negative for chills.  Respiratory: Positive for shortness of breath and wheezing. Negative for cough.   Cardiovascular: Negative for chest pain and palpitations.  Gastrointestinal: Negative for nausea, vomiting, abdominal pain, diarrhea and constipation.  Genitourinary: Negative for dysuria.  Neurological: Positive for weakness. Negative for dizziness, seizures and headaches.    DRUG ALLERGIES:   Allergies  Allergen Reactions  . Penicillins Rash    VITALS:  Blood pressure 107/48, pulse 70, temperature 97.8 F (36.6 C), temperature source Oral, resp. rate 18, height 5\' 2"  (1.575 m), weight 59.676 kg (131 lb 9 oz), SpO2 93 %.  PHYSICAL EXAMINATION:  Physical Exam  GENERAL:  81 y.o.-year-old patient lying in the bed with no acute distress.  EYES: Pupils equal, round, reactive to light and accommodation. No scleral icterus. Extraocular muscles intact.  HEENT: Head atraumatic, normocephalic. Oropharynx and nasopharynx clear.  NECK:  Supple, no jugular venous distention. No thyroid enlargement, no tenderness.  LUNGS: Normal breath sounds bilaterally, decreased bibasilar breath sounds.  Scattered wheezes and rhonchi heard all over the lungs more prominent posteriorly. Minimal use of accessory muscles noted with exertion. CARDIOVASCULAR: S1, S2  normal. 3/6 systolic murmur is present, no  rubs, or gallops.  ABDOMEN: Soft, nontender, nondistended. Bowel sounds present. No organomegaly or mass.  EXTREMITIES: No pedal edema, cyanosis, or clubbing.  NEUROLOGIC: Cranial nerves II through XII are intact. Muscle strength 5/5 in all extremities. Sensation intact. Gait not checked.  PSYCHIATRIC: The patient is alert and oriented x 2.  SKIN: No obvious rash, lesion, or ulcer.    LABORATORY PANEL:   CBC  Recent Labs Lab 09/21/14 0412  WBC 2.1*  HGB 10.2*  HCT 30.2*  PLT 41*   ------------------------------------------------------------------------------------------------------------------  Chemistries   Recent Labs Lab 09/19/14 1935  09/21/14 0412  NA 128*  < > 137  K 4.4  < > 4.0  CL 95*  < > 109  CO2 25  < > 24  GLUCOSE 116*  < > 91  BUN 40*  < > 31*  CREATININE 1.60*  < > 1.50*  CALCIUM 8.7*  < > 7.2*  AST 109*  --   --   ALT 65*  --   --   ALKPHOS 69  --   --   BILITOT 0.6  --   --   < > = values in this interval not displayed. ------------------------------------------------------------------------------------------------------------------  Cardiac Enzymes No results for input(s): TROPONINI in the last 168 hours. ------------------------------------------------------------------------------------------------------------------  RADIOLOGY:  Dg Chest Port 1 View  09/19/2014   CLINICAL DATA:  Confusion and weakness.  EXAM: PORTABLE CHEST - 1 VIEW  COMPARISON:  None.  FINDINGS: The heart is enlarged. There is tortuosity and ectasia of the thoracic aorta. The upper mediastinal contours are quite widened and there is a deviation of the trachea right would. Findings  most likely due to thyroid goiter. Recommend correlation with clinical findings. The lungs are clear. No pleural effusion. The bony thorax is intact.  IMPRESSION: Cardiac enlargement.  No acute pulmonary findings.  Probable thyroid goiter.   Electronically  Signed   By: Marijo Sanes M.D.   On: 09/19/2014 20:39    EKG:   Orders placed or performed during the hospital encounter of 09/19/14  . EKG 12-Lead  . EKG 12-Lead    ASSESSMENT AND PLAN:   81 year old female with past medical history significant for coronary artery disease and hypertension, who lives at home was brought in secondary to weakness and hypotension.  #1 sepsis-likely source acute cystitis. Blood and urine cultures have been ordered. -Continue on Rocephin for now. Still spiking fever-temperature 10 41F last night. -IV fluids, no pressors needed. Urine cultures growing gram-negative rods. Added Solu-Cortef.  #2 acute respiratory distress-receiving fluid boluses and higher amounts of IV fluids. With her age has likely developed pulmonary vascular congestion. We'll give a dose of Lasix. Discontinue IV fluids at this time. Get chest x-ray. Added Solu-Cortef which will help further blood pressure and also her breathing. Also DuoNeb Saturday.  #3 hypotension as-as blood pressure is low, will hold off on giving her home medications. Continue to monitor. -Patient is fatigued, do not expect a higher blood pressure for her. Okay if the systolic is greater than 85 and if patient is asymptomatic.  #4 pancytopenia- Could be secondary to sepsis. Not on any heparin products at this time. We'll continue to monitor at this time. No acute indication for any transfusion. No active bleeding noted. Partly dilutional as well.not sure if has any underlying pancytopenia.  #5 acute renal failure-no baseline available. Continue to monitor. Could be ATN from sepsis.  #6 Hyperthyroidism-patient on methimazole as outpatient. Normal TSH and T4.  #7 DVT prophylaxis - TEDs and SCDs.  Physical therapy has been ordered. They have recommended home health  All the records are reviewed and case discussed with Care Management/Social Workerr. Management plans discussed with the patient, family and they are  in agreement.  CODE STATUS: Full code  TOTAL TIME SPENT IN TAKING CARE OF THIS PATIENT: 37 minutes.   POSSIBLE D/C IN 2 DAYS, DEPENDING ON CLINICAL CONDITION.   Gladstone Lighter M.D on 09/21/2014 at 11:42 AM  Between 7am to 6pm - Pager - [phone]  After 6pm go to [email or web address] - password [email or web address] [facility]  Office  [phone]  CC: Primary care physician; ZUST, DON L, DO

## 2014-09-21 NOTE — Plan of Care (Addendum)
Problem: Discharge Progression Outcomes Goal: Discharge plan in place and appropriate Individualization: Pt prefers to be called Sandy Roberson who lives at home with her son.  Hx of CAD w/ stents and HTN controlled by home medications. High fall risk. Hourly rounding w/ individualization voiding schedule, bed alarm, & understanding of how to utilize call system. Goal: Other Discharge Outcomes/Goals Plan of care progress to goals: 1. No c/o pain  2. Hemodynamically:             -VSS other than continued lower BP, fever 102.4 coming onto night shift relieved by Motrin              -IVF continued 3. Complications: continued low BP (93/40, on call MD notified) 4. Tolerating heart healthy diet but has poor PO intake per day shift RN 5. +1 assist with walker tolerated well to [facility]. PT consulted.

## 2014-09-21 NOTE — Progress Notes (Signed)
Physical Therapy Treatment Patient Details Name: Anglia Zuchowski MRN: [id number] DOB: Mar 29, 1916 Today's Date: 09/21/2014    History of Present Illness Pt is a 81 y.o. female admitted with 2 days of weakness, near syncope and 2 recent falls.  Pt admitted with sepsis and hypotension.  PMH:  CAD s/p PCI stent placement, htn.    PT Comments    Pt noted to improved O2 saturation to 95% with seated exercises and up in chair. Pt noted to feel unsteady on her feet with steps to chair, but no overt LOB.   Follow Up Recommendations  Home health PT;Supervision/Assistance - 24 hour     Equipment Recommendations  Rolling walker with 5" wheels    Recommendations for Other Services       Precautions / Restrictions Precautions Precautions: Fall Restrictions Weight Bearing Restrictions: No    Mobility  Bed Mobility Overal bed mobility: Needs Assistance Bed Mobility: Supine to Sit     Supine to sit: Modified independent (Device/Increase time)     General bed mobility comments:  (Increased time/effort)  Transfers Overall transfer level: Needs assistance Equipment used: Rolling walker (2 wheeled) Transfers: Sit to/from Stand Sit to Stand: Min guard         General transfer comment: Poor hand placement and uncontrolled descent  Ambulation/Gait Ambulation/Gait assistance: Min guard Ambulation Distance (Feet): 6 Feet Assistive device: Rolling walker (2 wheeled) Gait Pattern/deviations: Step-to pattern Gait velocity: Reduced   General Gait Details: Unsteady without LOB   Stairs            Wheelchair Mobility    Modified Rankin (Stroke Patients Only)       Balance Overall balance assessment: Needs assistance         Standing balance support: Bilateral upper extremity supported Standing balance-Leahy Scale: Poor                      Cognition Arousal/Alertness: Awake/alert Behavior During Therapy: WFL for tasks assessed/performed Overall  Cognitive Status: Within Functional Limits for tasks assessed                      Exercises General Exercises - Lower Extremity Long Arc Quad: AROM;Both;20 reps;Seated Hip ABduction/ADduction: AROM;Both;20 reps;Seated Hip Flexion/Marching: AROM;Both;20 reps;Seated Toe Raises: AROM;20 reps;Both;Seated Heel Raises: AROM;Both;20 reps;Seated Other Exercises Other Exercises: ham curl B 20x light manual resist    General Comments        Pertinent Vitals/Pain Pain Assessment: No/denies pain    Home Living                      Prior Function            PT Goals (current goals can now be found in the care plan section) Progress towards PT goals: Progressing toward goals    Frequency  Min 2X/week    PT Plan Current plan remains appropriate    Co-evaluation             End of Session Equipment Utilized During Treatment: Gait belt Activity Tolerance: Patient tolerated treatment well Patient left: in chair;with call bell/phone within reach;with chair alarm set     Time: [id number] PT Time Calculation (min) (ACUTE ONLY): 26 min  Charges:  $Gait Training: 8-22 mins $Therapeutic Exercise: 8-22 mins                    G Codes:      Charlaine Dalton 09/21/2014, 12:15 PM

## 2014-09-21 NOTE — Progress Notes (Signed)
On call MD notified of low BP 95/40. New order of 552mL bolus read back & verified.   Sandy Roberson [employer]

## 2014-09-22 LAB — BASIC METABOLIC PANEL
ANION GAP: 6 (ref 5–15)
BUN: 38 mg/dL — AB (ref 6–20)
CALCIUM: 7.4 mg/dL — AB (ref 8.9–10.3)
CHLORIDE: 110 mmol/L (ref 101–111)
CO2: 24 mmol/L (ref 22–32)
Creatinine, Ser: 1.63 mg/dL — ABNORMAL HIGH (ref 0.44–1.00)
GFR calc Af Amer: 29 mL/min — ABNORMAL LOW (ref >60)
GFR calc non Af Amer: 25 mL/min — ABNORMAL LOW (ref >60)
Glucose, Bld: 115 mg/dL — ABNORMAL HIGH (ref 65–99)
Potassium: 3.9 mmol/L (ref 3.5–5.1)
SODIUM: 140 mmol/L (ref 135–145)

## 2014-09-22 LAB — CBC
HCT: 34.4 % — ABNORMAL LOW (ref 35.0–47.0)
HEMOGLOBIN: 11.3 g/dL — AB (ref 12.0–16.0)
MCH: 31.7 pg (ref 26.0–34.0)
MCHC: 32.9 g/dL (ref 32.0–36.0)
MCV: 96.2 fL (ref 80.0–100.0)
PLATELETS: 31 10*3/uL — AB (ref 150–440)
RBC: 3.58 MIL/uL — AB (ref 3.80–5.20)
RDW: 14.9 % — ABNORMAL HIGH (ref 11.5–14.5)
WBC: 2.9 10*3/uL — AB (ref 3.6–11.0)

## 2014-09-22 MED ORDER — ZOLPIDEM TARTRATE 5 MG PO TABS
5.0000 mg | ORAL_TABLET | Freq: Once | ORAL | Status: AC
  Administered 2014-09-22: 23:00:00 5 mg via ORAL
  Filled 2014-09-22: qty 1

## 2014-09-22 MED ORDER — ENSURE ENLIVE PO LIQD
237.0000 mL | Freq: Two times a day (BID) | ORAL | Status: DC
  Administered 2014-09-22 – 2014-09-25 (×5): 237 mL via ORAL

## 2014-09-22 MED ORDER — CEPHALEXIN 500 MG PO CAPS
500.0000 mg | ORAL_CAPSULE | Freq: Two times a day (BID) | ORAL | Status: DC
  Administered 2014-09-22 – 2014-09-25 (×7): 500 mg via ORAL
  Filled 2014-09-22 (×8): qty 1

## 2014-09-22 NOTE — Progress Notes (Signed)
Physical Therapy Treatment Patient Details Name: Sandy Roberson MRN: [id number] DOB: Aug 08, 1915 Today's Date: 09/22/2014    History of Present Illness Pt is a 81 y.o. female admitted with 2 days of weakness, near syncope and 2 recent falls.  Pt admitted with sepsis and hypotension.  PMH:  CAD s/p PCI stent placement, htn.    PT Comments    Pt agreeable/eager for PT. Initial O2 saturation on room air 88%; with pursed lip breathing increased to 92%. Pt progressing with ambulation and improved control with stand to sit. Continues to require cueing for hand placement. Pt up in chair comfortably. Pt has questions regarding equipment for home; answered pt's questions to her satisfaction.   Follow Up Recommendations  Home health PT;Supervision/Assistance - 24 hour     Equipment Recommendations  Rolling walker with 5" wheels    Recommendations for Other Services       Precautions / Restrictions Precautions Precautions: Fall Restrictions Weight Bearing Restrictions: No    Mobility  Bed Mobility Overal bed mobility: Modified Independent (Increased time) Bed Mobility: Supine to Sit     Supine to sit: Modified independent (Device/Increase time)        Transfers Overall transfer level: Needs assistance Equipment used: Rolling walker (2 wheeled) Transfers: Sit to/from Stand (bed and toilet) Sit to Stand: Min guard         General transfer comment: Cues for hand placement;   Ambulation/Gait Ambulation/Gait assistance: Min assist;Min guard (1 LOB episode on turning requiring Min A otherwise Min guard) Ambulation Distance (Feet): 22 Feet (Performed 2x) Assistive device: Rolling walker (2 wheeled) Gait Pattern/deviations: Step-through pattern;Decreased stride length;Decreased dorsiflexion - right;Decreased dorsiflexion - left;Trunk flexed Gait velocity: Reduced Gait velocity interpretation: <1.8 ft/sec, indicative of risk for recurrent falls General Gait Details: cues to  "roll" rolling walker versus picking up   Stairs            Wheelchair Mobility    Modified Rankin (Stroke Patients Only)       Balance Overall balance assessment: Needs assistance         Standing balance support: Bilateral upper extremity supported Standing balance-Leahy Scale: Fair                      Cognition Arousal/Alertness: Awake/alert Behavior During Therapy: WFL for tasks assessed/performed Overall Cognitive Status: Within Functional Limits for tasks assessed                      Exercises General Exercises - Lower Extremity Ankle Circles/Pumps: AROM;Both;20 reps;Seated Quad Sets: Strengthening;Both;20 reps;Seated Gluteal Sets: Strengthening;Both;20 reps;Seated Short Arc Quad: AROM;Both;20 reps;Seated Heel Slides: AROM;Both;20 reps;Seated Hip ABduction/ADduction: AROM;Both;20 reps;Seated Other Exercises Other Exercises: ham curl B 20x light manual resist Other Exercises: Set up and assist for don/doff brief for toileting    General Comments        Pertinent Vitals/Pain Pain Assessment: No/denies pain    Home Living                      Prior Function            PT Goals (current goals can now be found in the care plan section) Progress towards PT goals: Progressing toward goals    Frequency  Min 2X/week    PT Plan Current plan remains appropriate    Co-evaluation             End of Session Equipment Utilized During Treatment: Gait belt  Activity Tolerance: Patient tolerated treatment well Patient left: in chair;with call bell/phone within reach;with chair alarm set     Time: [id number] PT Time Calculation (min) (ACUTE ONLY): 39 min  Charges:  $Gait Training: 8-22 mins $Therapeutic Exercise: 8-22 mins $Therapeutic Activity: 8-22 mins                    G Codes:      Charlaine Dalton 09/22/2014, 10:34 AM

## 2014-09-22 NOTE — Care Management (Signed)
Sandy Roberson very hard of hearing. Will discuss home health agencies with son, [name], when he visits today.  IV Rocephin continues. WBC's 2.9. Afebrile. Shelbie Ammons RN MSN Care Management [phone]

## 2014-09-22 NOTE — Progress Notes (Signed)
On call MD, Dr. Posey Pronto, notified due to pt anxious and unable to sleep. Ambien 5mg  once ordered. Telephone order verified & read back. Will continue to assess.  Hiram Gash [employer]

## 2014-09-22 NOTE — Plan of Care (Signed)
Problem: Discharge Progression Outcomes Goal: Other Discharge Outcomes/Goals Outcome: Progressing Plan of care progress to goal for: Pain-no c/o pain this shift Hemodynamically-VSS Complications-no evidence of Diet-pt tolerating diet Activity-pt transferred from chair to bed with 1 assist

## 2014-09-22 NOTE — Progress Notes (Signed)
[name] NAME: Sandy Roberson    MR#:  [id number]  DATE OF BIRTH:  Mar 12, 1916  SUBJECTIVE:  CHIEF COMPLAINT:   Chief Complaint  Patient presents with  . Near Syncope  - no further fevers - breathing is improved today, patient though is worried about her appetite and her ability to walk - reassured about adding ensure and also physical therapy working with her today  REVIEW OF SYSTEMS:  Review of Systems  Constitutional: Negative for fever and chills.       Decreased appetite  Respiratory: Positive for shortness of breath. Negative for cough and wheezing.   Cardiovascular: Negative for chest pain and palpitations.  Gastrointestinal: Negative for nausea, vomiting, abdominal pain, diarrhea and constipation.  Genitourinary: Negative for dysuria.  Neurological: Positive for weakness. Negative for dizziness, seizures and headaches.    DRUG ALLERGIES:   Allergies  Allergen Reactions  . Penicillins Rash    VITALS:  Blood pressure 148/73, pulse 82, temperature 98 F (36.7 C), temperature source Oral, resp. rate 23, height 5\' 2"  (1.575 m), weight 59.676 kg (131 lb 9 oz), SpO2 82 %.  PHYSICAL EXAMINATION:  Physical Exam  GENERAL:  81 y.o.-year-old patient lying in the bed with no acute distress.  EYES: Pupils equal, round, reactive to light and accommodation. No scleral icterus. Extraocular muscles intact.  HEENT: Head atraumatic, normocephalic. Oropharynx and nasopharynx clear.  NECK:  Supple, no jugular venous distention. No thyroid enlargement, no tenderness.  LUNGS: Normal breath sounds bilaterally, decreased bibasilar breath sounds.  Bibasilar rhonchi heard posteriorly. No use of accessory muscles noted. CARDIOVASCULAR: S1, S2 normal. 3/6 systolic murmur is present, no  rubs, or gallops.  ABDOMEN: Soft, nontender, nondistended. Bowel sounds present. No organomegaly or mass.  EXTREMITIES: No pedal edema,  cyanosis, or clubbing.  NEUROLOGIC: Cranial nerves II through XII are intact. Muscle strength 5/5 in all extremities. Sensation intact. Gait not checked.  PSYCHIATRIC: The patient is alert and oriented x 2.  SKIN: No obvious rash, lesion, or ulcer.    LABORATORY PANEL:   CBC  Recent Labs Lab 09/22/14 0426  WBC 2.9*  HGB 11.3*  HCT 34.4*  PLT 31*   ------------------------------------------------------------------------------------------------------------------  Chemistries   Recent Labs Lab 09/19/14 1935  09/22/14 0426  NA 128*  < > 140  K 4.4  < > 3.9  CL 95*  < > 110  CO2 25  < > 24  GLUCOSE 116*  < > 115*  BUN 40*  < > 38*  CREATININE 1.60*  < > 1.63*  CALCIUM 8.7*  < > 7.4*  AST 109*  --   --   ALT 65*  --   --   ALKPHOS 69  --   --   BILITOT 0.6  --   --   < > = values in this interval not displayed. ------------------------------------------------------------------------------------------------------------------  Cardiac Enzymes No results for input(s): TROPONINI in the last 168 hours. ------------------------------------------------------------------------------------------------------------------  RADIOLOGY:  Dg Chest 2 View  09/21/2014   CLINICAL DATA:  Two days of weakness, near syncope, with recent falls. Sepsis, hypotension.  EXAM: CHEST  2 VIEW  COMPARISON:  09/19/2014  FINDINGS: There is hyperinflation compatible with COPD. Cardiomegaly with diffuse interstitial prominence, increasing since prior study, likely interstitial edema. More focal confluent opacity noted in the right upper lobe concerning for pneumonia. Small bilateral effusions.  IMPRESSION: COPD.  Findings compatible with interstitial edema/ CHF with small effusions.  Confluent opacity in the  right upper lobe concerning for pneumonia.   Electronically Signed   By: Rolm Baptise M.D.   On: 09/21/2014 14:19    EKG:   Orders placed or performed during the hospital encounter of 09/19/14  . EKG  12-Lead  . EKG 12-Lead    ASSESSMENT AND PLAN:   81 year old female with past medical history significant for coronary artery disease and hypertension, who lives at home was brought in secondary to weakness and hypotension.  #1 Sepsis-likely source acute cystitis. Blood and urine cultures have been ordered. -on Rocephin for now. Afebrile. -no pressors needed. Urine cultures growing E.coli- will change ABX to keflex. - off solucortef  #2 acute respiratory distress-secondary to pulmonary vascular congestion due to IV fluids.  - Improved with lasix, CXR showing the same. - off Solu-Cortef. Also DuoNebs prn.  #3 hypotension as-as blood pressure is low, will hold off on giving her home medications. Continue to monitor. -off solucortef  #4 pancytopenia- Could be secondary to sepsis. Not on any heparin products at this time. No acute indication for any transfusion. No active bleeding noted. - Platelets are steadily dropping, received heparin for only a day, HITT assay and plt antibodies will be send - will check with hematology- discussed with Georgeanne Nim from hematology- will monitor tomorrow, if low- will consult hematology  #5 acute renal failure-no baseline available. Continue to monitor. Could be ATN from sepsis.  #6 Hyperthyroidism-patient on methimazole as outpatient. Normal TSH and T4.  #7 DVT prophylaxis - TEDs and SCDs.  Physical therapy has been ordered. They have recommended home health Possible discharge in 1-2 days as platelets improve.   All the records are reviewed and case discussed with Care Management/Social Workerr. Management plans discussed with the patient, family and they are in agreement.  CODE STATUS: Full code  TOTAL TIME SPENT IN TAKING CARE OF THIS PATIENT: 37 minutes.   POSSIBLE D/C TOMORROW, DEPENDING ON CLINICAL CONDITION.   Gladstone Lighter M.D on 09/22/2014 at 12:04 PM  Between 7am to 6pm - Pager - [phone]  After 6pm go to  [email or web address] - password [email or web address] [facility]  Office  [phone]  CC: Primary care physician; ZUST, DON L, DO

## 2014-09-22 NOTE — Plan of Care (Signed)
Problem: Discharge Progression Outcomes Goal: Other Discharge Outcomes/Goals Outcome: Progressing Progress to Goals:  1. Pain:       Denies.    2. Hemodynamically:     Afebrile.       BP stabilized.      Weaned off Oxygen. O2 sat 96%. No sob noted. No cough noted. Tolerating well. 3. Complications:       No s/s of complications. 4. Diet:     Tolerating Healthy Heart diet. Decreased appetite.     Ensure supplement ordered by dietician. 5. Activity:     Rolling walker with 1 person assist.

## 2014-09-23 ENCOUNTER — Inpatient Hospital Stay (HOSPITAL_COMMUNITY)
Admit: 2014-09-23 | Discharge: 2014-09-23 | Disposition: A | Discharge status: Home / Self Care | Payer: Medicare Other | Attending: Internal Medicine | Admitting: Internal Medicine

## 2014-09-23 DIAGNOSIS — I351 Nonrheumatic aortic (valve) insufficiency: Secondary | ICD-10-CM

## 2014-09-23 LAB — CBC
HCT: 36.3 % (ref 35.0–47.0)
Hemoglobin: 12 g/dL (ref 12.0–16.0)
MCH: 31.7 pg (ref 26.0–34.0)
MCHC: 33.2 g/dL (ref 32.0–36.0)
MCV: 95.7 fL (ref 80.0–100.0)
Platelets: 31 10*3/uL — ABNORMAL LOW (ref 150–440)
RBC: 3.8 MIL/uL (ref 3.80–5.20)
RDW: 14.9 % — AB (ref 11.5–14.5)
WBC: 3.8 10*3/uL (ref 3.6–11.0)

## 2014-09-23 LAB — MISC LABCORP TEST (SEND OUT): Labcorp test code: 14086

## 2014-09-23 MED ORDER — FUROSEMIDE 10 MG/ML IJ SOLN
20.0000 mg | Freq: Once | INTRAMUSCULAR | Status: AC
  Administered 2014-09-23: 20 mg via INTRAVENOUS
  Filled 2014-09-23: qty 2

## 2014-09-23 MED ORDER — FUROSEMIDE 20 MG PO TABS
20.0000 mg | ORAL_TABLET | Freq: Two times a day (BID) | ORAL | Status: DC
  Administered 2014-09-23 – 2014-09-24 (×2): 20 mg via ORAL
  Filled 2014-09-23 (×3): qty 1

## 2014-09-23 NOTE — Progress Notes (Signed)
[facility] [place] NAME: Sandy Roberson    MR#:  [id number]  DATE OF BIRTH:  [birth year]  SUBJECTIVE:  CHIEF COMPLAINT:   Chief Complaint  Patient presents with  . Near Syncope  - appears tachypneic and crackles noted posteriorly on exam - remains on 2l o2 - BP stable, no further fevers - platelets low- but no further drop  REVIEW OF SYSTEMS:  Review of Systems  Constitutional: Negative for fever and chills.       Decreased appetite  Respiratory: Positive for shortness of breath. Negative for cough and wheezing.   Cardiovascular: Negative for chest pain and palpitations.  Gastrointestinal: Negative for nausea, vomiting, abdominal pain, diarrhea and constipation.  Genitourinary: Negative for dysuria.  Neurological: Positive for weakness. Negative for dizziness, seizures and headaches.    DRUG ALLERGIES:   Allergies  Allergen Reactions  . Penicillins Rash    VITALS:  Blood pressure 143/74, pulse 95, temperature 99.1 F (37.3 C), temperature source Oral, resp. rate 18, height 5\' 2"  (1.575 m), weight 59.676 kg (131 lb 9 oz), SpO2 97 %.  PHYSICAL EXAMINATION:  Physical Exam  GENERAL:  81 y.o.-year-old patient lying in the bed with no acute distress.  EYES: Pupils equal, round, reactive to light and accommodation. No scleral icterus. Extraocular muscles intact.  HEENT: Head atraumatic, normocephalic. Oropharynx and nasopharynx clear.  NECK:  Supple, no jugular venous distention. No thyroid enlargement, no tenderness.  LUNGS: Normal breath sounds bilaterally, decreased bibasilar breath sounds. Crackles at the bases - use of accessory muscles noted. Bibasilar rhonchi heard posteriorly. No use of accessory muscles noted. CARDIOVASCULAR: S1, S2 normal. 3/6 systolic murmur is present, no  rubs, or gallops.  ABDOMEN: Soft, nontender, nondistended. Bowel sounds present. No organomegaly or mass.  EXTREMITIES: No pedal edema,  cyanosis, or clubbing.  NEUROLOGIC: Cranial nerves II through XII are intact. Muscle strength 5/5 in all extremities. Sensation intact. Gait not checked.  PSYCHIATRIC: The patient is alert and oriented x 2.  SKIN: No obvious rash, lesion, or ulcer.    LABORATORY PANEL:   CBC  Recent Labs Lab 09/23/14 0804  WBC 3.8  HGB 12.0  HCT 36.3  PLT 31*   ------------------------------------------------------------------------------------------------------------------  Chemistries   Recent Labs Lab 09/19/14 1935  09/22/14 0426  NA 128*  < > 140  K 4.4  < > 3.9  CL 95*  < > 110  CO2 25  < > 24  GLUCOSE 116*  < > 115*  BUN 40*  < > 38*  CREATININE 1.60*  < > 1.63*  CALCIUM 8.7*  < > 7.4*  AST 109*  --   --   ALT 65*  --   --   ALKPHOS 69  --   --   BILITOT 0.6  --   --   < > = values in this interval not displayed. ------------------------------------------------------------------------------------------------------------------  Cardiac Enzymes No results for input(s): TROPONINI in the last 168 hours. ------------------------------------------------------------------------------------------------------------------  RADIOLOGY:  Dg Chest 2 View  09/21/2014   CLINICAL DATA:  Two days of weakness, near syncope, with recent falls. Sepsis, hypotension.  EXAM: CHEST  2 VIEW  COMPARISON:  09/19/2014  FINDINGS: There is hyperinflation compatible with COPD. Cardiomegaly with diffuse interstitial prominence, increasing since prior study, likely interstitial edema. More focal confluent opacity noted in the right upper lobe concerning for pneumonia. Small bilateral effusions.  IMPRESSION: COPD.  Findings compatible with interstitial edema/ CHF with small effusions.  Confluent opacity  in the right upper lobe concerning for pneumonia.   Electronically Signed   By: Rolm Baptise M.D.   On: 09/21/2014 14:19    EKG:   Orders placed or performed during the hospital encounter of 09/19/14  . EKG  12-Lead  . EKG 12-Lead    ASSESSMENT AND PLAN:   81 year old female with past medical history significant for coronary artery disease and hypertension, who lives at home was brought in secondary to weakness and hypotension.  #1 Sepsis-likely source acute cystitis. Blood and urine cultures have been ordered. -on Rocephin for now. Afebrile. -no pressors needed. Urine cultures growing E.coli- will change ABX to keflex. Total 7 days of ABX - off solucortef  #2 acute respiratory distress-secondary to pulmonary vascular congestion and possible RUL infiltrate on CXR.  - lasix 20mg  BID, CXR in am, check ECHO as well - off Solu-Cortef. Also DuoNebs prn.  #3 hypotension as-as blood pressure is low, will hold off on giving her home medications. Continue to monitor. -off solucortef  #4 pancytopenia- Could be secondary to sepsis. Not on any heparin products at this time. No acute indication for any transfusion. No active bleeding noted. - Platelets are steadily dropping, received heparin for only a day, HITT assay and plt antibodies sent - stable today  #5 acute renal failure-no baseline available. Continue to monitor. Could be ATN from sepsis.  #6 Hyperthyroidism-patient on methimazole as outpatient. Normal TSH and T4.  #7 DVT prophylaxis - TEDs and SCDs.  Physical therapy has been ordered. They have recommended home health Continue to wean o2.   All the records are reviewed and case discussed with Care Management/Social Workerr. Management plans discussed with the patient, family and they are in agreement.  CODE STATUS: Full code  TOTAL TIME SPENT IN TAKING CARE OF THIS PATIENT: 37 minutes.   POSSIBLE D/C TOMORROW, DEPENDING ON CLINICAL CONDITION.   Gladstone Lighter M.D on 09/23/2014 at 9:04 AM  Between 7am to 6pm - Pager - [phone]  After 6pm go to [email or web address] - password [email or web address] [facility]  Office  [phone]  CC: Primary care physician; ZUST,  DON L, DO

## 2014-09-23 NOTE — Progress Notes (Addendum)
   09/23/14 1730  Clinical Encounter Type  Visited With Patient  Visit Type Initial  Consult/Referral To Chaplain  Spiritual Encounters  Spiritual Needs Emotional  Stress Factors  Patient Stress Factors None identified  Chaplin rounded in unit and offered support.  [place] pager [phone]

## 2014-09-23 NOTE — Plan of Care (Signed)
Problem: Discharge Progression Outcomes Goal: Other Discharge Outcomes/Goals Plan of care progress to goal:  No complaints of pain. Patient remains on 2L-O2 due to shortness of breath and anxiety, unable to wean at this time. Continues to have poor appetite. High Fall Risk - bed alarm on, room near nurses station. 2D Echo performed, results pending.

## 2014-09-23 NOTE — Progress Notes (Signed)
*  PRELIMINARY RESULTS* Echocardiogram 2D Echocardiogram has been performed.  Sandy Roberson 09/23/2014, 2:22 PM

## 2014-09-23 NOTE — Plan of Care (Signed)
Problem: Discharge Progression Outcomes Goal: Other Discharge Outcomes/Goals Plan of care progress to goals: 1. No c/o pain   2. Hemodynamically: VSS, Afebrile. 2L Oxygen reapplied due to desat 88% & SOB at rest. O2 sat 92% on 2L tolerating well. 3. Complication: c/o insomnia, Ambien given which was effective. 4. Diet: Tolerating Healthy Heart diet but poor appetite. Ensure supplement ordered by dietician. 5. Activity: Rolling walker with 1 person assist to chair & bathroom No fall/injury this shift. Will continue to assess.

## 2014-09-24 DIAGNOSIS — D696 Thrombocytopenia, unspecified: Secondary | ICD-10-CM

## 2014-09-24 DIAGNOSIS — R509 Fever, unspecified: Secondary | ICD-10-CM

## 2014-09-24 DIAGNOSIS — R350 Frequency of micturition: Secondary | ICD-10-CM

## 2014-09-24 DIAGNOSIS — A419 Sepsis, unspecified organism: Principal | ICD-10-CM

## 2014-09-24 LAB — CULTURE, BLOOD (ROUTINE X 2)
CULTURE: NO GROWTH
Culture: NO GROWTH

## 2014-09-24 LAB — CBC
HCT: 33.8 % — ABNORMAL LOW (ref 35.0–47.0)
Hemoglobin: 11.3 g/dL — ABNORMAL LOW (ref 12.0–16.0)
MCH: 31.7 pg (ref 26.0–34.0)
MCHC: 33.5 g/dL (ref 32.0–36.0)
MCV: 94.6 fL (ref 80.0–100.0)
Platelets: 25 10*3/uL — CL (ref 150–440)
RBC: 3.57 MIL/uL — AB (ref 3.80–5.20)
RDW: 14.9 % — ABNORMAL HIGH (ref 11.5–14.5)
WBC: 4 10*3/uL (ref 3.6–11.0)

## 2014-09-24 LAB — BASIC METABOLIC PANEL
Anion gap: 8 (ref 5–15)
BUN: 41 mg/dL — AB (ref 6–20)
CALCIUM: 7.7 mg/dL — AB (ref 8.9–10.3)
CO2: 29 mmol/L (ref 22–32)
CREATININE: 1.43 mg/dL — AB (ref 0.44–1.00)
Chloride: 101 mmol/L (ref 101–111)
GFR calc non Af Amer: 30 mL/min — ABNORMAL LOW (ref >60)
GFR, EST AFRICAN AMERICAN: 34 mL/min — AB (ref >60)
Glucose, Bld: 111 mg/dL — ABNORMAL HIGH (ref 65–99)
POTASSIUM: 3.2 mmol/L — AB (ref 3.5–5.1)
SODIUM: 138 mmol/L (ref 135–145)

## 2014-09-24 MED ORDER — POTASSIUM CHLORIDE CRYS ER 20 MEQ PO TBCR
40.0000 meq | EXTENDED_RELEASE_TABLET | Freq: Once | ORAL | Status: AC
  Administered 2014-09-24: 10:00:00 40 meq via ORAL
  Filled 2014-09-24: qty 2

## 2014-09-24 MED ORDER — PREDNISONE 20 MG PO TABS
40.0000 mg | ORAL_TABLET | Freq: Every day | ORAL | Status: DC
  Administered 2014-09-24 – 2014-09-25 (×2): 40 mg via ORAL
  Filled 2014-09-24 (×2): qty 2

## 2014-09-24 NOTE — Progress Notes (Signed)
Patient O2 sat 97% on 2L. Will attempt to wean patient off O2 at this time. Madlyn Frankel, RN

## 2014-09-24 NOTE — Progress Notes (Signed)
Patient up to chair with ONE person assist. Madlyn Frankel, RN

## 2014-09-24 NOTE — Plan of Care (Signed)
Problem: Discharge Progression Outcomes Goal: Discharge plan in place and appropriate Outcome: Progressing Individualization: Pt prefers to be called Antionette who lives at home with her son.   Hx of CAD w/ stents and HTN controlled by home medications. High fall risk. Hourly rounding w/ individualization voiding schedule, bed alarm, & understanding of how to utilize call system.    Goal: Other Discharge Outcomes/Goals Outcome: Progressing Pt is disoriented to time, does not complain of pain or discomfort. Toileting offered during hourly rounding. Pt's son stopped by and would like MD to call with echo results. Will continue to monitor.

## 2014-09-24 NOTE — Progress Notes (Signed)
Spoke to Dr. Tressia Miners regarding blood pressure of 93/41 - per MD hold evening dose of Lasix. Madlyn Frankel, RN

## 2014-09-24 NOTE — Plan of Care (Signed)
Problem: Discharge Progression Outcomes Goal: Other Discharge Outcomes/Goals Plan of care progress to goal:  Patient continues to be confused. No complaints of pain. Attempted to wean patient off of O2, sat 87% - placed on 1L. Hematology consulted patient, prednisone initiated - first dose given today. PT ambulated patient. Blood pressure low at 93/41, evening dose of Lasix held per Dr. Tressia Miners.

## 2014-09-24 NOTE — Progress Notes (Signed)
Physical Therapy Treatment Patient Details Name: Sandy Roberson MRN: [id number] DOB: 09/21/1915 Today's Date: 09/24/2014    History of Present Illness Pt is a 81 y.o. female admitted with 2 days of weakness, near syncope and 2 recent falls.  Pt admitted with sepsis and hypotension.  PMH:  CAD s/p PCI stent placement, htn.    PT Comments    Patient ambulates further during this session than any previous, and displays good balance and gait mechanics throughout the session. Patient is hypotensive initially, though she was never symptomatic and this appears to be her baseline. Patient complained of some pain in her right quadricep with straight leg raises, though this is likely from weakness and deconditioning from her hospital stay. Patient is making progress towards goals, and would continue to benefit from skilled acute PT services.   Follow Up Recommendations  Home health PT;Supervision/Assistance - 24 hour     Equipment Recommendations  Rolling walker with 5" wheels (Tub bench and raised toilet seat may be appropriate as well. )    Recommendations for Other Services       Precautions / Restrictions Precautions Precautions: Fall Restrictions Weight Bearing Restrictions: No    Mobility  Bed Mobility Overal bed mobility: Needs Assistance Bed Mobility: Supine to Sit     Supine to sit: Min guard     General bed mobility comments: Patient was able to exit the bed on the left side utilizing the handrails with mild verbal cuing and minimal assistance to bring her legs closer to the side of the bed.   Transfers Overall transfer level: Needs assistance Equipment used: Rolling walker (2 wheeled) Transfers: Sit to/from Stand Sit to Stand: Min guard         General transfer comment: Cues to stand taller with RW.   Ambulation/Gait Ambulation/Gait assistance: Supervision Ambulation Distance (Feet): 85 Feet Assistive device: Rolling walker (2 wheeled)   Gait velocity:  Reduced   General Gait Details: Patient displayed reciprocal gait pattern and no loss of balance or rest breaks throughout ambulation.    Stairs            Wheelchair Mobility    Modified Rankin (Stroke Patients Only)       Balance Overall balance assessment: Needs assistance   Sitting balance-Leahy Scale: Good     Standing balance support: Bilateral upper extremity supported Standing balance-Leahy Scale: Fair Standing balance comment: Patient able to stand for BP to be taken for roughly 1.5 minutes with no loss of balance. Patient displayed no balance deficits with RW, including safe turning technique to enter the chair.                     Cognition Arousal/Alertness: Awake/alert Behavior During Therapy: WFL for tasks assessed/performed Overall Cognitive Status: Within Functional Limits for tasks assessed                      Exercises General Exercises - Lower Extremity Long Arc Quad: AROM;15 reps;Both Heel Slides: AROM;Both;15 reps Straight Leg Raises: AROM;Both;10 reps    General Comments        Pertinent Vitals/Pain Pain Assessment: 0-10 (No complaints of pain aside from completing SLRs, she stated some pain in right quadriceps.) Pain Intervention(s): Limited activity within patient's tolerance;Monitored during session    Home Living                      Prior Function  PT Goals (current goals can now be found in the care plan section) Acute Rehab PT Goals Patient Stated Goal: To go home PT Goal Formulation: With patient Time For Goal Achievement: 10/04/14 Potential to Achieve Goals: Good Progress towards PT goals: Progressing toward goals    Frequency  Min 2X/week    PT Plan Current plan remains appropriate    Co-evaluation             End of Session Equipment Utilized During Treatment: Gait belt Activity Tolerance: Patient tolerated treatment well Patient left: in chair;with call bell/phone  within reach;with chair alarm set     Time: 1536-1600 PT Time Calculation (min) (ACUTE ONLY): 24 min  Charges:  $Gait Training: 8-22 mins $Therapeutic Exercise: 8-22 mins                    G Codes:      Kerman Passey, PT, DPT   09/24/2014, 4:38 PM

## 2014-09-24 NOTE — Progress Notes (Signed)
Initial Nutrition Assessment  DOCUMENTATION CODES:     INTERVENTION:   (Medical Nutition Supplement: Will add mightyshake BID for added nutrition)  NUTRITION DIAGNOSIS:  Inadequate oral intake related to acute illness as evidenced by meal completion < 50%.    GOAL:  Patient will meet greater than or equal to 90% of their needs    MONITOR:   (Energy intake, electrolyte and renal profile, Anthrpometric)  REASON FOR ASSESSMENT:   (length of stay)    ASSESSMENT:  Pt admitted with sepsis secondary to acute cystitis, weakness, hypotension  Past Medical History  Diagnosis Date  . Coronary artery disease   . Hypertension    Electrolyte and Renal Profile:  Recent Labs Lab 09/21/14 0412 09/22/14 0426 09/24/14 0614  BUN 31* 38* 41*  CREATININE 1.50* 1.63* 1.43*  NA 137 140 138  K 4.0 3.9 3.2*   Medications: lasix, KCL  Current nutrition: Pt eating on average 36% of meals per I and O sheet.  Intake prior to admission: normal intake  Height:  Ht Readings from Last 1 Encounters:  09/19/14 5\' 2"  (1.575 m)    Weight:  Wt Readings from Last 1 Encounters:  09/19/14 131 lb 9 oz (59.676 kg)    Stable weight prior to admission:     Wt Readings from Last 10 Encounters:  09/19/14 131 lb 9 oz (59.676 kg)    BMI:  Body mass index is 24.06 kg/(m^2).  Diet Order:  Diet Heart Room service appropriate?: Yes; Fluid consistency:: Thin  EDUCATION NEEDS:  No education needs identified at this time   Intake/Output Summary (Last 24 hours) at 09/24/14 1533 Last data filed at 09/24/14 1349  Gross per 24 hour  Intake    120 ml  Output      0 ml  Net    120 ml    Last BM:  6/8  LOW Care Level Elyana Grabski B. Zenia Resides, [place], [place] (pager)

## 2014-09-24 NOTE — Consult Note (Signed)
[place]  [phone] [phone]) [phone]  ID: Sandy Roberson OB: [birth year]  MR#: [id number]  [id number]  Patient Care Team: Donzetta Kohut, DO as PCP - General (Family Medicine)  CHIEF COMPLAINT:  Chief Complaint  Patient presents with  . Near Syncope    INTERVAL HISTORY: Patient is a 81 year old female who presented to the emergency room on September 19, 2014 with increased weakness and fatigue, urinary frequency, and fever of 103. She also was noted to have a platelet count of 70. Patient sepsis symptoms have improved, but her platelet count has decreased to less than 30. Review of systems is difficult given patient's heart appearing and what appears to be slight dementia. She does not endorse any bruising or bleeding. She feels that her baseline and offers no specific complaints.  REVIEW OF SYSTEMS:   Review of Systems  Constitutional: Negative for fever.  Respiratory: Negative.   Cardiovascular: Negative.   Genitourinary: Negative.     As per HPI. Otherwise, a complete review of systems is negatve.  PAST MEDICAL HISTORY: Past Medical History  Diagnosis Date  . Coronary artery disease   . Hypertension     PAST SURGICAL HISTORY: History reviewed. No pertinent past surgical history.  FAMILY HISTORY Family History  Problem Relation Age of Onset  . Heart failure Neg Hx        ADVANCED DIRECTIVES:    HEALTH MAINTENANCE: History  Substance Use Topics  . Smoking status: Never Smoker   . Smokeless tobacco: Not on file  . Alcohol Use: No     Colonoscopy:  PAP:  Bone density:  Lipid panel:  Allergies  Allergen Reactions  . Penicillins Rash    Current Facility-Administered Medications  Medication Dose Route Frequency Provider Last Rate Last Dose  . acetaminophen (TYLENOL) tablet 650 mg  650 mg Oral Q6H PRN Lytle Butte, MD   325 mg at 09/22/14 1343   Or  . acetaminophen (TYLENOL) suppository 650 mg  650 mg Rectal Q6H PRN  Lytle Butte, MD      . calcitRIOL (ROCALTROL) capsule 0.25 mcg  0.25 mcg Oral Daily Lytle Butte, MD   0.25 mcg at 09/24/14 [id number]  . cephALEXin (KEFLEX) capsule 500 mg  500 mg Oral Q12H [place] Lighter, MD   500 mg at 09/24/14 0823  . feeding supplement (ENSURE ENLIVE) (ENSURE ENLIVE) liquid 237 mL  237 mL Oral BID BM [place] Lighter, MD   237 mL at 09/24/14 0824  . furosemide (LASIX) tablet 20 mg  20 mg Oral BID [place] Lighter, MD   20 mg at 09/24/14 0823  . ibuprofen (ADVIL,MOTRIN) tablet 600 mg  600 mg Oral Q6H PRN Dustin Flock, MD   600 mg at 09/20/14 1902  . ipratropium-albuterol (DUONEB) 0.5-2.5 (3) MG/3ML nebulizer solution 3 mL  3 mL Nebulization Q4H PRN [place] Lighter, MD   3 mL at 09/24/14 1118  . methimazole (TAPAZOLE) tablet 5 mg  5 mg Oral Daily Lytle Butte, MD   10 mg at 09/24/14 [id number]  . morphine 2 MG/ML injection 2 mg  2 mg Intravenous Q4H PRN Lytle Butte, MD      . ondansetron [facility]) tablet 4 mg  4 mg Oral Q6H PRN Lytle Butte, MD       Or  . ondansetron [facility]) injection 4 mg  4 mg Intravenous Q6H PRN Lytle Butte, MD      . simvastatin (ZOCOR) tablet 40 mg  40 mg Oral QHS  Lytle Butte, MD   40 mg at 09/23/14 1935    OBJECTIVE: Filed Vitals:   09/24/14 1304  BP: 93/48  Pulse: 71  Temp: 98.1 F (36.7 C)  Resp: 20     Body mass index is 24.06 kg/(m^2).    ECOG FS:2 - Symptomatic, <50% confined to bed  General: Well-developed, well-nourished, no acute distress. Eyes: Pink conjunctiva, anicteric sclera. HEENT: Normocephalic, moist mucous membranes, clear oropharnyx. Lungs: Clear to auscultation bilaterally. Heart: Regular rate and rhythm. No rubs, murmurs, or gallops. Abdomen: Soft, nontender, nondistended. No organomegaly noted, normoactive bowel sounds. Musculoskeletal: No edema, cyanosis, or clubbing. Neuro: Alert Skin: No rashes or petechiae noted. Psych: Normal affect. Lymphatics: No cervical, calvicular, axillary or inguinal LAD.   LAB  RESULTS:  Lab Results  Component Value Date   NA 138 09/24/2014   K 3.2* 09/24/2014   CL 101 09/24/2014   CO2 29 09/24/2014   GLUCOSE 111* 09/24/2014   BUN 41* 09/24/2014   CREATININE 1.43* 09/24/2014   CALCIUM 7.7* 09/24/2014   PROT 6.8 09/19/2014   ALBUMIN 3.5 09/19/2014   AST 109* 09/19/2014   ALT 65* 09/19/2014   ALKPHOS 69 09/19/2014   BILITOT 0.6 09/19/2014   GFRNONAA 30* 09/24/2014   GFRAA 34* 09/24/2014    Lab Results  Component Value Date   WBC 4.0 09/24/2014   NEUTROABS 2.3 09/19/2014   HGB 11.3* 09/24/2014   HCT 33.8* 09/24/2014   MCV 94.6 09/24/2014   PLT 25* 09/24/2014     STUDIES: Dg Chest 2 View  09/21/2014   CLINICAL DATA:  Two days of weakness, near syncope, with recent falls. Sepsis, hypotension.  EXAM: CHEST  2 VIEW  COMPARISON:  09/19/2014  FINDINGS: There is hyperinflation compatible with COPD. Cardiomegaly with diffuse interstitial prominence, increasing since prior study, likely interstitial edema. More focal confluent opacity noted in the right upper lobe concerning for pneumonia. Small bilateral effusions.  IMPRESSION: COPD.  Findings compatible with interstitial edema/ CHF with small effusions.  Confluent opacity in the right upper lobe concerning for pneumonia.   Electronically Signed   By: Rolm Baptise M.D.   On: 09/21/2014 14:19   Dg Chest Port 1 View  09/19/2014   CLINICAL DATA:  Confusion and weakness.  EXAM: PORTABLE CHEST - 1 VIEW  COMPARISON:  None.  FINDINGS: The heart is enlarged. There is tortuosity and ectasia of the thoracic aorta. The upper mediastinal contours are quite widened and there is a deviation of the trachea right would. Findings most likely due to thyroid goiter. Recommend correlation with clinical findings. The lungs are clear. No pleural effusion. The bony thorax is intact.  IMPRESSION: Cardiac enlargement.  No acute pulmonary findings.  Probable thyroid goiter.   Electronically Signed   By: Marijo Sanes M.D.   On: 09/19/2014  20:39    ASSESSMENT: Thrombocytopenia in the setting of sepsis and positive platelet antibodies.  PLAN:   1. Thrombocytopenia: Unclear if patient's decreased platelet count is acute or chronic. She presented with a platelet count of 70 and it has declined since admission. Workup included platelet antibodies, which were positive. Will give patient a short course of daily prednisone for 1 week in order to improve her platelet count. Typical doses 1 mg/kg, but will dose reduce given her advanced age.  Patient can then follow-up in the [place] in 1-2 weeks for repeat laboratory work and further evaluation.  Appreciate consult, call with questions.   Lloyd Huger, MD   09/24/2014  4:05 PM

## 2014-09-24 NOTE — Progress Notes (Signed)
[place] at [place] NAME: Sandy Roberson    MR#:  [id number]  DATE OF BIRTH:  Aug 21, 1915  SUBJECTIVE:  CHIEF COMPLAINT:   Chief Complaint  Patient presents with  . Near Syncope  - breathing is better. Patient got confused last night, but more alert and oriented this morning. -Able to be weaned to 1 L nasal cannula today -Platelets down to 25K. Positive platelet antibody test. Awaiting hematology consult  REVIEW OF SYSTEMS:  Review of Systems  Constitutional: Negative for fever and chills.       Decreased appetite  Respiratory: Positive for shortness of breath. Negative for cough and wheezing.   Cardiovascular: Negative for chest pain and palpitations.  Gastrointestinal: Negative for nausea, vomiting, abdominal pain, diarrhea and constipation.  Genitourinary: Negative for dysuria.  Neurological: Positive for weakness. Negative for dizziness, seizures and headaches.    DRUG ALLERGIES:   Allergies  Allergen Reactions  . Penicillins Rash    VITALS:  Blood pressure 93/48, pulse 71, temperature 98.1 F (36.7 C), temperature source Oral, resp. rate 20, height 5\' 2"  (1.575 m), weight 59.676 kg (131 lb 9 oz), SpO2 96 %.  PHYSICAL EXAMINATION:  Physical Exam  GENERAL:  81 y.o.-year-old patient lying in the bed with no acute distress.  EYES: Pupils equal, round, reactive to light and accommodation. No scleral icterus. Extraocular muscles intact.  HEENT: Head atraumatic, normocephalic. Oropharynx and nasopharynx clear.  NECK:  Supple, no jugular venous distention. No thyroid enlargement, no tenderness.  LUNGS: Normal breath sounds bilaterally, decreased bibasilar breath sounds. Crackles at the bases - use of accessory muscles noted. Bibasilar rhonchi heard posteriorly. No use of accessory muscles noted. CARDIOVASCULAR: S1, S2 normal. 3/6 systolic murmur is present, no  rubs, or gallops.  ABDOMEN: Soft, nontender,  nondistended. Bowel sounds present. No organomegaly or mass.  EXTREMITIES: No pedal edema, cyanosis, or clubbing.  NEUROLOGIC: Cranial nerves II through XII are intact. Muscle strength 5/5 in all extremities. Sensation intact. Gait not checked.  PSYCHIATRIC: The patient is alert and oriented x 2.  SKIN: No obvious rash, lesion, or ulcer.    LABORATORY PANEL:   CBC  Recent Labs Lab 09/24/14 0614  WBC 4.0  HGB 11.3*  HCT 33.8*  PLT 25*   ------------------------------------------------------------------------------------------------------------------  Chemistries   Recent Labs Lab 09/19/14 1935  09/24/14 0614  NA 128*  < > 138  K 4.4  < > 3.2*  CL 95*  < > 101  CO2 25  < > 29  GLUCOSE 116*  < > 111*  BUN 40*  < > 41*  CREATININE 1.60*  < > 1.43*  CALCIUM 8.7*  < > 7.7*  AST 109*  --   --   ALT 65*  --   --   ALKPHOS 69  --   --   BILITOT 0.6  --   --   < > = values in this interval not displayed. ------------------------------------------------------------------------------------------------------------------  Cardiac Enzymes No results for input(s): TROPONINI in the last 168 hours. ------------------------------------------------------------------------------------------------------------------  RADIOLOGY:  No results found.  EKG:   Orders placed or performed during the hospital encounter of 09/19/14  . EKG 12-Lead  . EKG 12-Lead    ASSESSMENT AND PLAN:   81 year old female with past medical history significant for coronary artery disease and hypertension, who lives at home was brought in secondary to weakness and hypotension.  #1 Sepsis-likely source acute cystitis. Blood and urine cultures have been ordered. -on Rocephin for  now. - Low-grade temperature of 100.5 Fahrenheit this morning -no pressors needed. Urine cultures growing E.coli- will change ABX to keflex. Total 7 days of ABX -Continues to spike fevers will need to change antibiotics, after  repeat culturing - off solucortef  #2 acute respiratory distress-secondary to pulmonary vascular congestion and possible RUL infiltrate on CXR.  - lasix 20mg  BID, CXR today, echo with normal EF, diastolic dysfunction noted - off Solu-Cortef. Also DuoNebs prn.  #3 hypokalemia-being replaced appropriately  #4 pancytopenia- Could be secondary to sepsis. Not on any heparin products at this time. No acute indication for any transfusion. No active bleeding noted. - Platelets are steadily dropping, received heparin for only a day, HITT assay and plt antibodies sent-positive platelet antibody test. - Hematology consulted  #5 acute renal failure-no baseline available. Continue to monitor. Could be ATN from sepsis.  #6 Hyperthyroidism-patient on methimazole as outpatient. Normal TSH and T4.  #7 DVT prophylaxis - TEDs and SCDs.  Physical therapy has been ordered. They have recommended home health Continue to wean o2.   All the records are reviewed and case discussed with Care Management/Social Workerr. Management plans discussed with the patient, family and they are in agreement.  CODE STATUS: Full code  TOTAL TIME SPENT IN TAKING CARE OF THIS PATIENT: 37 minutes.   POSSIBLE D/C TOMORROW if platelets are improving, DEPENDING ON CLINICAL CONDITION.   Sandy Roberson M.D on 09/24/2014 at 1:33 PM  Between 7am to 6pm - Pager - [phone]  After 6pm go to [email or web address] - password [email or web address] [facility]  Office  [phone]  CC: Primary care physician; ZUST, DON L, DO

## 2014-09-24 NOTE — Progress Notes (Signed)
Patient O2 sat 87% on room air. Patient placed back on 1L-O2. Madlyn Frankel, RN

## 2014-09-25 DIAGNOSIS — I5031 Acute diastolic (congestive) heart failure: Secondary | ICD-10-CM

## 2014-09-25 DIAGNOSIS — D693 Immune thrombocytopenic purpura: Secondary | ICD-10-CM

## 2014-09-25 DIAGNOSIS — E876 Hypokalemia: Secondary | ICD-10-CM

## 2014-09-25 DIAGNOSIS — N179 Acute kidney failure, unspecified: Secondary | ICD-10-CM

## 2014-09-25 LAB — CBC
HCT: 32.3 % — ABNORMAL LOW (ref 35.0–47.0)
Hemoglobin: 11 g/dL — ABNORMAL LOW (ref 12.0–16.0)
MCH: 32 pg (ref 26.0–34.0)
MCHC: 33.9 g/dL (ref 32.0–36.0)
MCV: 94.6 fL (ref 80.0–100.0)
Platelets: 30 10*3/uL — ABNORMAL LOW (ref 150–440)
RBC: 3.42 MIL/uL — AB (ref 3.80–5.20)
RDW: 15 % — ABNORMAL HIGH (ref 11.5–14.5)
WBC: 5.1 10*3/uL (ref 3.6–11.0)

## 2014-09-25 LAB — HIT PANEL (HEPARIN AB + SRA)
Heparin Induced Plt Ab: 0.244 OD (ref 0.000–0.400)
SRA .2 IU/mL UFH Ser-aCnc: 1 % (ref 0–20)
SRA, HIGH DOSE HEPARIN: 1 % (ref 0–20)

## 2014-09-25 LAB — BASIC METABOLIC PANEL
Anion gap: 7 (ref 5–15)
BUN: 43 mg/dL — AB (ref 6–20)
CO2: 30 mmol/L (ref 22–32)
Calcium: 7.9 mg/dL — ABNORMAL LOW (ref 8.9–10.3)
Chloride: 104 mmol/L (ref 101–111)
Creatinine, Ser: 1.37 mg/dL — ABNORMAL HIGH (ref 0.44–1.00)
GFR, EST AFRICAN AMERICAN: 36 mL/min — AB (ref >60)
GFR, EST NON AFRICAN AMERICAN: 31 mL/min — AB (ref >60)
Glucose, Bld: 169 mg/dL — ABNORMAL HIGH (ref 65–99)
Potassium: 4.2 mmol/L (ref 3.5–5.1)
Sodium: 141 mmol/L (ref 135–145)

## 2014-09-25 MED ORDER — CEPHALEXIN 500 MG PO CAPS: 500.0000 mg | ORAL_CAPSULE | Freq: Two times a day (BID) | ORAL | Status: DC

## 2014-09-25 MED ORDER — CARVEDILOL 3.125 MG PO TABS: 3.1250 mg | ORAL_TABLET | Freq: Two times a day (BID) | ORAL | Status: DC

## 2014-09-25 MED ORDER — PREDNISONE 20 MG PO TABS: 40.0000 mg | ORAL_TABLET | Freq: Every day | ORAL | Status: DC

## 2014-09-25 MED ORDER — FUROSEMIDE 20 MG PO TABS: 20.0000 mg | ORAL_TABLET | Freq: Every day | ORAL | Status: DC

## 2014-09-25 NOTE — Discharge Instructions (Signed)
°  DIET:  Cardiac diet  DISCHARGE CONDITION:  Stable  ACTIVITY:  Activity as tolerated  OXYGEN:  Home Oxygen: Yes   Oxygen Delivery: 2L via nasal cannula  DISCHARGE LOCATION:  Home with home health   If you experience worsening of your admission symptoms, develop shortness of breath, life threatening emergency, suicidal or homicidal thoughts you must seek medical attention immediately by calling 911 or calling your MD immediately  if symptoms less severe.  You Must read complete instructions/literature along with all the possible adverse reactions/side effects for all the Medicines you take and that have been prescribed to you. Take any new Medicines after you have completely understood and accpet all the possible adverse reactions/side effects.   Please note  You were cared for by a hospitalist during your hospital stay. If you have any questions about your discharge medications or the care you received while you were in the hospital after you are discharged, you can call the unit and asked to speak with the hospitalist on call if the hospitalist that took care of you is not available. Once you are discharged, your primary care physician will handle any further medical issues. Please note that NO REFILLS for any discharge medications will be authorized once you are discharged, as it is imperative that you return to your primary care physician (or establish a relationship with a primary care physician if you do not have one) for your aftercare needs so that they can reassess your need for medications and monitor your lab values.

## 2014-09-25 NOTE — Progress Notes (Signed)
Pt discharged home with home health. Discharge instructions reviewed with patient and son. All questions answered, verbalized understanding. [facility]. Equipment to be delivered. Left via wheelchair with nursing and family member.

## 2014-09-25 NOTE — Plan of Care (Signed)
Problem: Discharge Progression Outcomes Goal: Other Discharge Outcomes/Goals Outcome: Progressing Plan of care progress to goal for: Pain-no c/o pain this shift Hemodynamically-VSS, pt continues on 1L of O2 via nasal cannula, pt continues to be confused at times Complications-no evidence of this shift Diet-tolerating diet at this time Activity-pt worked with Pt today

## 2014-09-25 NOTE — Progress Notes (Signed)
[place] at [place] NAME: Sandy Roberson    MR#:  [id number]  DATE OF BIRTH:  January 05, 1916  SUBJECTIVE:  CHIEF COMPLAINT:   Chief Complaint  Patient presents with  . Near Syncope  - doing well, on 1l nasal cannula - platelets improving with prednisone - no other complaints, possible discharge today  REVIEW OF SYSTEMS:  Review of Systems  Constitutional: Negative for fever and chills.       Decreased appetite  Respiratory: Negative for cough, shortness of breath and wheezing.   Cardiovascular: Negative for chest pain and palpitations.  Gastrointestinal: Negative for nausea, vomiting, abdominal pain, diarrhea and constipation.  Genitourinary: Negative for dysuria.  Neurological: Positive for weakness. Negative for dizziness, seizures and headaches.    DRUG ALLERGIES:   Allergies  Allergen Reactions  . Penicillins Rash    VITALS:  Blood pressure 121/55, pulse 79, temperature 97.9 F (36.6 C), temperature source Oral, resp. rate 18, height 5\' 2"  (1.575 m), weight 59.676 kg (131 lb 9 oz), SpO2 92 %.  PHYSICAL EXAMINATION:  Physical Exam  GENERAL:  81 y.o.-year-old patient lying in the bed with no acute distress.  EYES: Pupils equal, round, reactive to light and accommodation. No scleral icterus. Extraocular muscles intact.  HEENT: Head atraumatic, normocephalic. Oropharynx and nasopharynx clear.  NECK:  Supple, no jugular venous distention. No thyroid enlargement, no tenderness.  LUNGS: Normal breath sounds bilaterally, decreased bibasilar breath sounds. Crackles at the bases - use of accessory muscles noted. Bibasilar rhonchi heard posteriorly. No use of accessory muscles noted. CARDIOVASCULAR: S1, S2 normal. 3/6 systolic murmur is present, no  rubs, or gallops.  ABDOMEN: Soft, nontender, nondistended. Bowel sounds present. No organomegaly or mass.  EXTREMITIES: No pedal edema, cyanosis, or clubbing.  NEUROLOGIC:  Cranial nerves II through XII are intact. Muscle strength 5/5 in all extremities. Sensation intact. Gait not checked.  PSYCHIATRIC: The patient is alert and oriented x 2.  SKIN: No obvious rash, lesion, or ulcer.    LABORATORY PANEL:   CBC  Recent Labs Lab 09/25/14 0455  WBC 5.1  HGB 11.0*  HCT 32.3*  PLT 30*   ------------------------------------------------------------------------------------------------------------------  Chemistries   Recent Labs Lab 09/19/14 1935  09/25/14 0455  NA 128*  < > 141  K 4.4  < > 4.2  CL 95*  < > 104  CO2 25  < > 30  GLUCOSE 116*  < > 169*  BUN 40*  < > 43*  CREATININE 1.60*  < > 1.37*  CALCIUM 8.7*  < > 7.9*  AST 109*  --   --   ALT 65*  --   --   ALKPHOS 69  --   --   BILITOT 0.6  --   --   < > = values in this interval not displayed. ------------------------------------------------------------------------------------------------------------------  Cardiac Enzymes No results for input(s): TROPONINI in the last 168 hours. ------------------------------------------------------------------------------------------------------------------  RADIOLOGY:  No results found.  EKG:   Orders placed or performed during the hospital encounter of 09/19/14  . EKG 12-Lead  . EKG 12-Lead    ASSESSMENT AND PLAN:   81 year old female with past medical history significant for coronary artery disease and hypertension, who lives at home was brought in secondary to weakness and hypotension.  #1 Sepsis-likely source acute cystitis. Blood and urine cultures have been ordered. -was on Rocephin- changed to keflex -no pressors needed. Urine cultures growing E.coli- will change ABX to keflex.  - off solucortef  #  2 Acute respiratory distress-secondary to pulmonary vascular congestion and possible RUL infiltrate on CXR.  - lasix 20mg  BID, echo with normal EF, diastolic dysfunction noted - off Solu-Cortef. Also DuoNebs prn. - on 1l o2-will need to  see if can be weaned off or will need home o2  #3 hypokalemia-replaced appropriately  #4 pancytopenia- Could be secondary to sepsis. Not on any heparin products at this time. No acute indication for any transfusion. No active bleeding noted. - THROMBOCYTOPENIA- Platelets are steadily dropping, received heparin for only a day, HITT assay and plt antibodies sent-positive platelet antibody test. - Hematology consulted, started on prednisone. Improving pltelets - cont for 1week prednisone and outpatient check  #5 acute renal failure-no baseline available. Continue to monitor. Could be ATN from sepsis. Improving  #6 Hyperthyroidism-patient on methimazole as outpatient. Normal TSH and T4.  #7 DVT prophylaxis - TEDs and SCDs.  Physical therapy has been ordered. They have recommended home health Continue to wean o2. Son updated prior to discharge  All the records are reviewed and case discussed with Care Management/Social Workerr. Management plans discussed with the patient, family and they are in agreement.  CODE STATUS: Full code  TOTAL TIME SPENT IN TAKING CARE OF THIS PATIENT: 37 minutes.   POSSIBLE D/C TODAY, DEPENDING ON CLINICAL CONDITION.   [place] Lighter M.D on 09/25/2014 at 9:43 AM  Between 7am to 6pm - Pager - [phone]  After 6pm go to [email or web address] - password [email or web address] [facility]  Office  [phone]  CC: Primary care physician; ZUST, DON L, DO

## 2014-09-25 NOTE — Progress Notes (Signed)
SATURATION QUALIFICATIONS: (This note is used to comply with regulatory documentation for home oxygen)  Patient Saturations on Room Air at Rest = 90%  Patient Saturations on Room Air while Ambulating = 80%  Patient Saturations on 2 Liters of oxygen while Ambulating = 92%  Please briefly explain why patient needs home oxygen: CHF, generalized weakness from sepsis, 81yo and frail.

## 2014-09-25 NOTE — Care Management Note (Signed)
Case Management Note  Patient Details  Name: Sandy Roberson MRN: [id number] Date of Birth: 07-Oct-1915  Subjective/Objective:           Spoke with son [name] [phone]) and explained to his that oxygen for his Mother and DME home equipment was just ordered this morning and that it might be many hours before we can get it delivered. Discussed home health providers and decided upon [facility]. A referral to [facility] has been faxed and called requesting home health PT, RN, Aid, a rolling walker, a bedside commode, and home and portable oxygen.            Expected Discharge Date:                  Expected Discharge Plan:     In-House Referral:     Discharge planning Services     Post Acute Care Choice:    Choice offered to:     DME Arranged:    DME Agency:     HH Arranged:    [place]:     Status of Service:     Medicare Important Message Given:  Yes Date Medicare IM Given:  09/22/14 Medicare IM give by:  Shelbie Ammons RN MSN  Date Additional Medicare IM Given:    Additional Medicare Important Message give by:     If discussed at [place] of Stay Meetings, dates discussed:    Additional Comments:  Leslie Langille A, RN 09/25/2014, 11:42 AM

## 2014-09-25 NOTE — Discharge Summary (Addendum)
Sandy Roberson at [place] NAME: Sandy Roberson    MR#:  [id number]  DATE OF BIRTH:  [birth year]  DATE OF ADMISSION:  09/19/2014 ADMITTING PHYSICIAN: Lytle Butte, MD  DATE OF DISCHARGE: 09/25/14  PRIMARY CARE PHYSICIAN: ZUST, DON L, DO    ADMISSION DIAGNOSIS:  UTI (lower urinary tract infection) [N39.0] Sepsis, due to unspecified organism [A41.9]  DISCHARGE DIAGNOSIS:  Principal Problem:   Sepsis due to urinary tract infection Active Problems:   Hyponatremia   ITP (idiopathic thrombocytopenic purpura)   ARF (acute renal failure)   Hypokalemia   Acute diastolic CHF (congestive heart failure)   SECONDARY DIAGNOSIS:   Past Medical History  Diagnosis Date  . Coronary artery disease   . Hypertension     HOSPITAL COURSE:   81 year old female with past medical history significant for coronary artery disease and hypertension, who lives at home was brought in secondary to weakness and hypotension.  #1 Sepsis-likely source acute cystitis. Blood and urine cultures have been ordered. -was on Rocephin- changed to keflex x 10 days total -no pressors needed. Urine cultures growing E.coli - off solucortef  #2 Acute respiratory distress-secondary to pulmonary vascular congestion and possible RUL infiltrate on CXR.  - has had acute diastolic CHF exacerbation - lasix 20mg  qdaily at discharge and low salt diet - echo with normal EF, diastolic dysfunction noted - off Solu-Cortef. Also DuoNebs prn. - on 2L o2-qualified for home o2  #3 hypokalemia-replaced appropriately  #4 Pancytopenia- Could be secondary to sepsis. Not on any heparin products at this time. No acute indication for any transfusion. No active bleeding noted. - THROMBOCYTOPENIA- Likely ITP - Platelets steadily dropped, received heparin for only a day, HITT assay is pending - But plt antibodies -positive platelet antibody test. - Hematology consulted, started on  prednisone. Improving platelets - cont for 1week prednisone and outpatient check up  #5 acute renal failure-no baseline available. Continue to monitor. Could be ATN from sepsis. Improving and stable now  #6 Hyperthyroidism-patient on methimazole as outpatient. Normal TSH and T4.  Physical therapy has been ordered. They have recommended home health Continue to wean o2. Son updated prior to discharge  DISCHARGE CONDITIONS:   Guarded  CONSULTS OBTAINED:  Treatment Team:  Lytle Butte, MD Lloyd Huger, MD  DRUG ALLERGIES:   Allergies  Allergen Reactions  . Penicillins Rash    DISCHARGE MEDICATIONS:   Current Discharge Medication List    START taking these medications   Details  carvedilol (COREG) 3.125 MG tablet Take 1 tablet (3.125 mg total) by mouth 2 (two) times daily with a meal. Qty: 60 tablet, Refills: 1    cephALEXin (KEFLEX) 500 MG capsule Take 1 capsule (500 mg total) by mouth 2 (two) times daily. Qty: 8 capsule, Refills: 0    furosemide (LASIX) 20 MG tablet Take 1 tablet (20 mg total) by mouth daily. Qty: 30 tablet, Refills: 2    predniSONE (DELTASONE) 20 MG tablet Take 2 tablets (40 mg total) by mouth daily with breakfast. Qty: 15 tablet, Refills: 0   Associated Diagnoses: Thrombocytopenia      CONTINUE these medications which have NOT CHANGED   Details  calcitRIOL (ROCALTROL) 0.25 MCG capsule Take 1 capsule by mouth daily.    methimazole (TAPAZOLE) 5 MG tablet Take 1 tablet by mouth daily.    simvastatin (ZOCOR) 40 MG tablet Take 1 tablet by mouth at bedtime.      STOP taking these medications  nebivolol (BYSTOLIC) 10 MG tablet      aspirin EC 81 MG tablet      losartan (COZAAR) 100 MG tablet          DISCHARGE INSTRUCTIONS:   1. PCP f/u in 1-2 weeks 2. F/u with Dr. Grayland Ormond in 1 week 3. Home with home health  If you experience worsening of your admission symptoms, develop shortness of breath, life threatening emergency,  suicidal or homicidal thoughts you must seek medical attention immediately by calling 911 or calling your MD immediately  if symptoms less severe.  You Must read complete instructions/literature along with all the possible adverse reactions/side effects for all the Medicines you take and that have been prescribed to you. Take any new Medicines after you have completely understood and accept all the possible adverse reactions/side effects.   Please note  You were cared for by a hospitalist during your hospital stay. If you have any questions about your discharge medications or the care you received while you were in the hospital after you are discharged, you can call the unit and asked to speak with the hospitalist on call if the hospitalist that took care of you is not available. Once you are discharged, your primary care physician will handle any further medical issues. Please note that NO REFILLS for any discharge medications will be authorized once you are discharged, as it is imperative that you return to your primary care physician (or establish a relationship with a primary care physician if you do not have one) for your aftercare needs so that they can reassess your need for medications and monitor your lab values.    Today   CHIEF COMPLAINT:   Chief Complaint  Patient presents with  . Near Syncope    VITAL SIGNS:  Blood pressure 121/55, pulse 79, temperature 97.9 F (36.6 C), temperature source Oral, resp. rate 18, height 5\' 2"  (1.575 m), weight 59.676 kg (131 lb 9 oz), SpO2 92 %.  I/O:   Intake/Output Summary (Last 24 hours) at 09/25/14 0959 Last data filed at 09/25/14 0941  Gross per 24 hour  Intake    240 ml  Output      0 ml  Net    240 ml    PHYSICAL EXAMINATION:   Physical Exam  GENERAL: 81 y.o.-year-old patient lying in the bed with no acute distress.  EYES: Pupils equal, round, reactive to light and accommodation. No scleral icterus. Extraocular muscles intact.   HEENT: Head atraumatic, normocephalic. Oropharynx and nasopharynx clear.  NECK: Supple, no jugular venous distention. No thyroid enlargement, no tenderness.  LUNGS: Normal breath sounds bilaterally, decreased bibasilar breath sounds. Crackles at the bases - use of accessory muscles noted. Bibasilar rhonchi heard posteriorly. No use of accessory muscles noted. CARDIOVASCULAR: S1, S2 normal. 3/6 systolic murmur is present, no rubs, or gallops.  ABDOMEN: Soft, nontender, nondistended. Bowel sounds present. No organomegaly or mass.  EXTREMITIES: No pedal edema, cyanosis, or clubbing.  NEUROLOGIC: Cranial nerves II through XII are intact. Muscle strength 5/5 in all extremities. Sensation intact. Gait not checked.  PSYCHIATRIC: The patient is alert and oriented x 2.  SKIN: No obvious rash, lesion, or ulcer.   DATA REVIEW:   CBC  Recent Labs Lab 09/25/14 0455  WBC 5.1  HGB 11.0*  HCT 32.3*  PLT 30*    Chemistries   Recent Labs Lab 09/19/14 1935  09/25/14 0455  NA 128*  < > 141  K 4.4  < > 4.2  CL  95*  < > 104  CO2 25  < > 30  GLUCOSE 116*  < > 169*  BUN 40*  < > 43*  CREATININE 1.60*  < > 1.37*  CALCIUM 8.7*  < > 7.9*  AST 109*  --   --   ALT 65*  --   --   ALKPHOS 69  --   --   BILITOT 0.6  --   --   < > = values in this interval not displayed.  Cardiac Enzymes No results for input(s): TROPONINI in the last 168 hours.  Microbiology Results  Results for orders placed or performed during the hospital encounter of 09/19/14  Blood Culture (routine x 2)     Status: None   Collection Time: 09/19/14  7:34 PM  Result Value Ref Range Status   Specimen Description BLOOD  Final   Special Requests NONE  Final   Culture NO GROWTH 5 DAYS  Final   Report Status 09/24/2014 FINAL  Final  Urine culture     Status: None   Collection Time: 09/19/14  8:26 PM  Result Value Ref Range Status   Specimen Description URINE, RANDOM  Final   Special Requests NONE  Final    Culture >=100,000 COLONIES/mL ESCHERICHIA COLI  Final   Report Status 09/21/2014 FINAL  Final   Organism ID, Bacteria ESCHERICHIA COLI  Final      Susceptibility   Escherichia coli - MIC*    AMPICILLIN <=2 SENSITIVE Sensitive     CEFTAZIDIME <=1 SENSITIVE Sensitive     CEFAZOLIN <=4 SENSITIVE Sensitive     CEFTRIAXONE <=1 SENSITIVE Sensitive     CIPROFLOXACIN >=4 RESISTANT Resistant     GENTAMICIN <=1 SENSITIVE Sensitive     IMIPENEM <=0.25 SENSITIVE Sensitive     TRIMETH/SULFA <=20 SENSITIVE Sensitive     CEFOXITIN <=4 SENSITIVE Sensitive     * >=100,000 COLONIES/mL ESCHERICHIA COLI  Blood Culture (routine x 2)     Status: None   Collection Time: 09/19/14  8:28 PM  Result Value Ref Range Status   Specimen Description BLOOD  Final   Special Requests NONE  Final   Culture NO GROWTH 5 DAYS  Final   Report Status 09/24/2014 FINAL  Final    RADIOLOGY:  No results found.  EKG:   Orders placed or performed during the hospital encounter of 09/19/14  . EKG 12-Lead  . EKG 12-Lead      Management plans discussed with the patient, family and they are in agreement.  CODE STATUS:     Code Status Orders        Start     Ordered   09/19/14 2314  Full code   Continuous     09/19/14 2314      TOTAL TIME TAKING CARE OF THIS PATIENT: 40 minutes.    Gladstone Lighter M.D on 09/25/2014 at 9:59 AM  Between 7am to 6pm - Pager - [phone]  After 6pm go to [email or web address] - password [email or web address] [facility]  Office  [phone]  CC: Primary care physician; ZUST, DON L, DO

## 2014-10-04 ENCOUNTER — Telehealth: Payer: Self-pay | Admitting: *Deleted

## 2014-10-04 NOTE — Telephone Encounter (Signed)
After reading consult note. Patient is to follow up with Dr Grayland Ormond and have labs drawn. Jenny Reichmann will have son call and schedule an appt for next week

## 2014-10-05 NOTE — Telephone Encounter (Signed)
Dr. Grayland Ormond is ok with patient getting labs through home health.  Will find out what labs he would like to have drawn next week and inform home health.  Left message informing Jenny Reichmann that patient does not need to schedule an appointment in office but will order labs via home health.

## 2014-10-06 LAB — GLUCOSE, CAPILLARY: GLUCOSE-CAPILLARY: 115 mg/dL — AB (ref 65–99)

## 2014-10-14 NOTE — Telephone Encounter (Signed)
Verbal order given to [facility] to draw CBC with next visit which is on 10/15/14.

## 2014-10-19 ENCOUNTER — Encounter: Payer: Self-pay | Admitting: Oncology

## 2014-10-19 ENCOUNTER — Inpatient Hospital Stay: Payer: Medicare Other | Attending: Oncology | Admitting: Oncology

## 2014-10-27 NOTE — Progress Notes (Signed)
This encounter was created in error - please disregard.

## 2014-12-18 ENCOUNTER — Emergency Department
Admission: EM | Admit: 2014-12-18 | Discharge: 2014-12-18 | Disposition: A | Discharge status: Home / Self Care | Payer: Medicare Other | Attending: Emergency Medicine | Admitting: Emergency Medicine

## 2014-12-18 ENCOUNTER — Encounter: Payer: Self-pay | Admitting: Emergency Medicine

## 2014-12-18 DIAGNOSIS — N390 Urinary tract infection, site not specified: Secondary | ICD-10-CM

## 2014-12-18 DIAGNOSIS — I10 Essential (primary) hypertension: Secondary | ICD-10-CM | POA: Diagnosis not present

## 2014-12-18 DIAGNOSIS — Z792 Long term (current) use of antibiotics: Secondary | ICD-10-CM | POA: Insufficient documentation

## 2014-12-18 DIAGNOSIS — Z880 Allergy status to penicillin: Secondary | ICD-10-CM | POA: Diagnosis not present

## 2014-12-18 DIAGNOSIS — Z79899 Other long term (current) drug therapy: Secondary | ICD-10-CM | POA: Diagnosis not present

## 2014-12-18 DIAGNOSIS — R350 Frequency of micturition: Secondary | ICD-10-CM | POA: Diagnosis present

## 2014-12-18 MED ORDER — FOSFOMYCIN TROMETHAMINE 3 G PO PACK: 3.0000 g | PACK | Freq: Once | ORAL | Status: DC

## 2014-12-18 MED ORDER — FOSFOMYCIN TROMETHAMINE 3 G PO PACK
3.0000 g | PACK | ORAL | Status: AC
  Administered 2014-12-18: 3 g via ORAL
  Filled 2014-12-18: qty 3

## 2014-12-18 NOTE — Discharge Instructions (Signed)

## 2014-12-18 NOTE — ED Notes (Signed)
Was called by [facility] on Friday and was told a urine culture was + and to return to the office on Tuesday to discuss plan , care giver ( son) is concerned of possible Sepsis , with known hx of sepsis, was dx this week with 30% kidney function,

## 2014-12-18 NOTE — ED Provider Notes (Signed)
[facility] Emergency Department Provider Note REMINDER - THIS NOTE IS NOT A FINAL MEDICAL RECORD UNTIL IT IS SIGNED. UNTIL THEN, THE CONTENT BELOW MAY REFLECT INFORMATION FROM A DOCUMENTATION TEMPLATE, NOT THE ACTUAL PATIENT VISIT. ____________________________________________  Time seen: Approximately 4:58 PM  I have reviewed the triage vital signs and the nursing notes.   HISTORY  Chief Complaint Urinary Frequency    HPI Sandy Roberson is a 81 y.o. female previous history of anemia, hypertension, coronary disease, and sepsis due to urinary tract infections.  She was seen by nephrology per her son and the patient at [facility] over the week, and had a urine specimen done that was concerning for infection. They were called and told to come to the clinic Tuesday for antibiotic choices. Patient's son has brought her in because he is concerned that she has a history of sepsis with urinary tract infections, and that she should not wait until Tuesday to receive treatment.  The patient denies having any fevers. She denies any chills. No nausea or vomiting. She states that she feels fine and is not having any symptoms. She is incontinent of urine at baseline.     Past Medical History  Diagnosis Date  . Coronary artery disease   . Hypertension     Patient Active Problem List   Diagnosis Date Noted  . ITP (idiopathic thrombocytopenic purpura) 09/25/2014  . ARF (acute renal failure) 09/25/2014  . Hypokalemia 09/25/2014  . Acute diastolic CHF (congestive heart failure) 09/25/2014  . Sepsis due to urinary tract infection 09/19/2014  . Hyponatremia 09/19/2014    Past Surgical History  Procedure Laterality Date  . Coronary stent placement    . Stent placement rt ureter ([facility] hx)      since been removed     Current Outpatient Rx  Name  Route  Sig  Dispense  Refill  . calcitRIOL (ROCALTROL) 0.25 MCG capsule   Oral   Take 1 capsule by mouth daily.          . carvedilol (COREG) 3.125 MG tablet   Oral   Take 1 tablet (3.125 mg total) by mouth 2 (two) times daily with a meal.   60 tablet   1   . cephALEXin (KEFLEX) 500 MG capsule   Oral   Take 1 capsule (500 mg total) by mouth 2 (two) times daily.   8 capsule   0   . fosfomycin (MONUROL) 3 G PACK   Oral   Take 3 g by mouth once.   3 g   0   . furosemide (LASIX) 20 MG tablet   Oral   Take 1 tablet (20 mg total) by mouth daily.   30 tablet   2   . methimazole (TAPAZOLE) 5 MG tablet   Oral   Take 1 tablet by mouth daily.         . predniSONE (DELTASONE) 20 MG tablet   Oral   Take 2 tablets (40 mg total) by mouth daily with breakfast.   15 tablet   0   . simvastatin (ZOCOR) 40 MG tablet   Oral   Take 1 tablet by mouth at bedtime.           Allergies Penicillins  Family History  Problem Relation Age of Onset  . Heart failure Neg Hx     Social History Social History  Substance Use Topics  . Smoking status: Never Smoker   . Smokeless tobacco: None  . Alcohol Use: No  Review of Systems Constitutional: No fever/chills Eyes: No visual changes. ENT: No sore throat. Cardiovascular: Denies chest pain. Respiratory: Denies shortness of breath. Gastrointestinal: No abdominal pain.  No nausea, no vomiting.  No diarrhea.  No constipation. Genitourinary: Negative for dysuria. Musculoskeletal: Negative for back pain. Skin: Negative for rash. Neurological: Negative for headaches, focal weakness or numbness.  The patient denies any active symptoms.  10-point ROS otherwise negative.  ____________________________________________   PHYSICAL EXAM:  VITAL SIGNS: ED Triage Vitals  Enc Vitals Group     BP 12/18/14 1623 157/66 mmHg     Pulse Rate 12/18/14 1623 71     Resp 12/18/14 1623 18     Temp 12/18/14 1623 98.2 F (36.8 C)     Temp src --      SpO2 12/18/14 1623 98 %     Weight 12/18/14 1623 116 lb (52.617 kg)     Height 12/18/14 1623 5\' 2"  (1.575 m)      Head Cir --      Peak Flow --      Pain Score 12/18/14 1624 0     Pain Loc --      Pain Edu? --      Excl. in [place]? --    Constitutional: Alert and oriented. Well appearing and in no acute distress. Eyes: Conjunctivae are normal. PERRL. EOMI. Head: Atraumatic. Nose: No congestion/rhinnorhea. Mouth/Throat: Mucous membranes are moist.  Oropharynx non-erythematous. Neck: No stridor.   Cardiovascular: Normal rate, regular rhythm. Grossly normal heart sounds.  Good peripheral circulation. Respiratory: Normal respiratory effort.  No retractions. Lungs CTAB. Gastrointestinal: Soft and nontender. No distention. No abdominal bruits. No CVA tenderness. Musculoskeletal: No lower extremity tenderness nor edema.  No joint effusions. Neurologic:  Normal speech and language. No gross focal neurologic deficits are appreciated. Skin:  Skin is warm, dry and intact. No rash noted. Psychiatric: Mood and affect are normal. Speech and behavior are normal.  Remarkable normal exam for a 81 year old female.  ____________________________________________   LABS (all labs ordered are listed, but only abnormal results are displayed)  Labs Reviewed - No data to display ____________________________________________  EKG   ____________________________________________  RADIOLOGY   ____________________________________________   PROCEDURES  Procedure(s) performed: None  Critical Care performed: No    [facility] DATA  Urine Culture, Comprehensive Final report (A) Result 1 Escherichia coli (A) Comment: Greater than 100,000 colony forming units per mL Antimicrobial Susceptibility Comment Comment: S = Susceptible; I = Intermediate; R = Resistant                    P = Positive; N = Negative             MICS are expressed in micrograms per mL    Antibiotic                 RSLT#1    RSLT#2    RSLT#3    RSLT#4 Amoxicillin/Clavulanic Acid    S Ampicillin                     R Cefazolin                       R Cefepime                       R Ceftriaxone                    R Cefuroxime  R Cephalothin                    R Ciprofloxacin                  R Ertapenem                      S Gentamicin                     S Imipenem                       S Levofloxacin                   R Nitrofurantoin                 S Piperacillin                   R Tetracycline                   R Tobramycin                     S Trimethoprim/Sulfa             R  ____________________________________________   INITIAL IMPRESSION / ASSESSMENT AND PLAN / ED COURSE  Pertinent labs & imaging results that were available during my care of the patient were reviewed by me and considered in my medical decision making (see chart for details).  Patient presents for concerns of essentially asymptomatic bacteriuria, though possibly some slight fatigue over the last week per her son. No fevers or chills. Vital signs are normal. No evidence by clinical history or exam to suggest sepsis. Given her history of frequent UTIs lead to sepsis, we will treat.  D/W Dr. Dietrich [place] (Infect Disease at [facility]) regarding Abx choices given pt PCN allergy (though can tolerate cephalosporins), recommends fosfomycin 3 grams PO once now, repeat dose once in 3 days if needed.  Discussed with the patient and her son, we'll treat her with fosfomycin the ER. I'll prescribe a repeat dose for her to take in 3 days should her symptoms not be improving. I advised him and close return precautions including to come back right away if she develops any fever, nausea, vomiting, he has increasing fatigue or weakness, develops abdominal pain, or other new concerns arise. He'll follow up with her nephrologist via phone on Tuesday as planned as well as her primary care physician this week. ____________________________________________   FINAL CLINICAL IMPRESSION(S) / ED DIAGNOSES  Final diagnoses:  UTI (urinary tract infection),  uncomplicated      Delman Kitten, MD 12/18/14 1711

## 2015-06-10 ENCOUNTER — Encounter: Payer: Self-pay | Admitting: Emergency Medicine

## 2015-06-10 ENCOUNTER — Emergency Department: Payer: Medicare Other

## 2015-06-10 ENCOUNTER — Emergency Department
Admission: EM | Admit: 2015-06-10 | Discharge: 2015-06-10 | Disposition: A | Discharge status: Home / Self Care | Payer: Medicare Other | Attending: Student | Admitting: Student

## 2015-06-10 DIAGNOSIS — R55 Syncope and collapse: Secondary | ICD-10-CM

## 2015-06-10 DIAGNOSIS — R42 Dizziness and giddiness: Secondary | ICD-10-CM | POA: Diagnosis not present

## 2015-06-10 DIAGNOSIS — N390 Urinary tract infection, site not specified: Secondary | ICD-10-CM

## 2015-06-10 DIAGNOSIS — Z7952 Long term (current) use of systemic steroids: Secondary | ICD-10-CM | POA: Insufficient documentation

## 2015-06-10 DIAGNOSIS — N189 Chronic kidney disease, unspecified: Secondary | ICD-10-CM | POA: Diagnosis not present

## 2015-06-10 DIAGNOSIS — Z79899 Other long term (current) drug therapy: Secondary | ICD-10-CM | POA: Insufficient documentation

## 2015-06-10 DIAGNOSIS — Z792 Long term (current) use of antibiotics: Secondary | ICD-10-CM | POA: Insufficient documentation

## 2015-06-10 DIAGNOSIS — R531 Weakness: Secondary | ICD-10-CM | POA: Diagnosis present

## 2015-06-10 DIAGNOSIS — Z880 Allergy status to penicillin: Secondary | ICD-10-CM | POA: Insufficient documentation

## 2015-06-10 DIAGNOSIS — I129 Hypertensive chronic kidney disease with stage 1 through stage 4 chronic kidney disease, or unspecified chronic kidney disease: Secondary | ICD-10-CM | POA: Insufficient documentation

## 2015-06-10 LAB — BASIC METABOLIC PANEL
Anion gap: 4 — ABNORMAL LOW (ref 5–15)
BUN: 42 mg/dL — ABNORMAL HIGH (ref 6–20)
CO2: 29 mmol/L (ref 22–32)
Calcium: 9.2 mg/dL (ref 8.9–10.3)
Chloride: 104 mmol/L (ref 101–111)
Creatinine, Ser: 1.33 mg/dL — ABNORMAL HIGH (ref 0.44–1.00)
GFR calc Af Amer: 38 mL/min — ABNORMAL LOW (ref >60)
GFR calc non Af Amer: 32 mL/min — ABNORMAL LOW (ref >60)
Glucose, Bld: 97 mg/dL (ref 65–99)
Potassium: 4.4 mmol/L (ref 3.5–5.1)
Sodium: 137 mmol/L (ref 135–145)

## 2015-06-10 LAB — URINALYSIS COMPLETE WITH MICROSCOPIC (ARMC ONLY)
BILIRUBIN URINE: NEGATIVE
Bacteria, UA: NONE SEEN
GLUCOSE, UA: NEGATIVE mg/dL
Hgb urine dipstick: NEGATIVE
Ketones, ur: NEGATIVE mg/dL
Nitrite: NEGATIVE
PH: 6 (ref 5.0–8.0)
Protein, ur: NEGATIVE mg/dL
Specific Gravity, Urine: 1.012 (ref 1.005–1.030)

## 2015-06-10 LAB — CBC
HCT: 34.4 % — ABNORMAL LOW (ref 35.0–47.0)
HEMOGLOBIN: 11.7 g/dL — AB (ref 12.0–16.0)
MCH: 32.6 pg (ref 26.0–34.0)
MCHC: 34.1 g/dL (ref 32.0–36.0)
MCV: 95.8 fL (ref 80.0–100.0)
Platelets: 133 10*3/uL — ABNORMAL LOW (ref 150–440)
RBC: 3.59 MIL/uL — ABNORMAL LOW (ref 3.80–5.20)
RDW: 14.9 % — ABNORMAL HIGH (ref 11.5–14.5)
WBC: 4.4 10*3/uL (ref 3.6–11.0)

## 2015-06-10 LAB — TROPONIN I: Troponin I: <0.03 ng/mL (ref <0.031)

## 2015-06-10 MED ORDER — LEVOFLOXACIN 250 MG PO TABS: 250.0000 mg | ORAL_TABLET | Freq: Every day | ORAL | Status: AC

## 2015-06-10 MED ORDER — LEVOFLOXACIN IN D5W 750 MG/150ML IV SOLN
750.0000 mg | Freq: Once | INTRAVENOUS | Status: AC
  Administered 2015-06-10: 750 mg via INTRAVENOUS
  Filled 2015-06-10: qty 150

## 2015-06-10 MED ORDER — SODIUM CHLORIDE 0.9 % IV BOLUS (SEPSIS): 500.0000 mL | Freq: Once | INTRAVENOUS | Status: DC

## 2015-06-10 MED ORDER — ENSURE ENLIVE PO LIQD: 237.0000 mL | Freq: Two times a day (BID) | ORAL | Status: DC

## 2015-06-10 NOTE — ED Provider Notes (Signed)
[facility] Emergency Department Provider Note  ____________________________________________  Time seen: Approximately 10:53 AM  I have reviewed the triage vital signs and the nursing notes.   HISTORY  Chief Complaint Weakness    HPI Sandy Roberson is a 82 y.o. female with history of coronary artery disease, hypertension, frequent urinary tract infections, CHF, chronic kidney disease, presents for evaluation of resolved dizziness as well as syncope versus near-syncope, gradual onset, initially severe, now resolved, worse with sitting up. The patient reports that this morning she "knew something was going to happen". She felt dizzy "like objects in the room were moving around". She called her son who reports that she was in her recliner and she either fainted for a few seconds or nearly fainted. She did not fall or injure herself. On EMS arrival, she was noted to be mildly hypotensive which improved with fluids. Son is concerned that she has a urinary tract infection as previously she had presented in the exact same fashion with a urinary tract infection. She denies any chest pain or difficulty breathing. No abdominal pain, vomiting, diarrhea, fevers or chills.   Past Medical History  Diagnosis Date  . Coronary artery disease   . Hypertension     Patient Active Problem List   Diagnosis Date Noted  . ITP (idiopathic thrombocytopenic purpura) 09/25/2014  . ARF (acute renal failure) ([place]) 09/25/2014  . Hypokalemia 09/25/2014  . Acute diastolic CHF (congestive heart failure) ([place]) 09/25/2014  . Sepsis due to urinary tract infection ([place]) 09/19/2014  . Hyponatremia 09/19/2014    Past Surgical History  Procedure Laterality Date  . Coronary stent placement    . Stent placement rt ureter ([facility] hx)      since been removed     Current Outpatient Rx  Name  Route  Sig  Dispense  Refill  . calcitRIOL (ROCALTROL) 0.25 MCG capsule   Oral   Take 1 capsule by  mouth daily.         . carvedilol (COREG) 3.125 MG tablet   Oral   Take 1 tablet (3.125 mg total) by mouth 2 (two) times daily with a meal.   60 tablet   1   . methimazole (TAPAZOLE) 5 MG tablet   Oral   Take 1 tablet by mouth daily.         . nitrofurantoin, macrocrystal-monohydrate, (MACROBID) 100 MG capsule   Oral   Take 100 mg by mouth daily.         . cephALEXin (KEFLEX) 500 MG capsule   Oral   Take 1 capsule (500 mg total) by mouth 2 (two) times daily.   8 capsule   0   . fosfomycin (MONUROL) 3 G PACK   Oral   Take 3 g by mouth once.   3 g   0   . furosemide (LASIX) 20 MG tablet   Oral   Take 1 tablet (20 mg total) by mouth daily.   30 tablet   2   . levofloxacin (LEVAQUIN) 250 MG tablet   Oral   Take 1 tablet (250 mg total) by mouth daily. Fill today, start tomorrow.   3 tablet   0   . predniSONE (DELTASONE) 20 MG tablet   Oral   Take 2 tablets (40 mg total) by mouth daily with breakfast.   15 tablet   0   . simvastatin (ZOCOR) 40 MG tablet   Oral   Take 1 tablet by mouth at bedtime.  Allergies Penicillins  Family History  Problem Relation Age of Onset  . Heart failure Neg Hx     Social History Social History  Substance Use Topics  . Smoking status: Never Smoker   . Smokeless tobacco: None  . Alcohol Use: No    Review of Systems Constitutional: No fever/chills Eyes: No visual changes. ENT: No sore throat. Cardiovascular: Denies chest pain. Respiratory: Denies shortness of breath. Gastrointestinal: No abdominal pain.  No nausea, no vomiting.  No diarrhea.  No constipation. Genitourinary: Negative for dysuria. Musculoskeletal: Negative for back pain. Skin: Negative for rash. Neurological: Negative for headaches, focal weakness or numbness.  10-point ROS otherwise negative.  ____________________________________________   PHYSICAL EXAM:  VITAL SIGNS: ED Triage Vitals  Enc Vitals Group     BP 06/10/15 1026  94/75 mmHg     Pulse Rate 06/10/15 1026 67     Resp 06/10/15 1026 18     Temp 06/10/15 1023 97.5 F (36.4 C)     Temp Source 06/10/15 1023 Oral     SpO2 06/10/15 1026 99 %     Weight 06/10/15 1023 109 lb 8 oz (49.669 kg)     Height 06/10/15 1023 5\' 2"  (1.575 m)     Head Cir --      Peak Flow --      Pain Score 06/10/15 1025 0     Pain Loc --      Pain Edu? --      Excl. in [place]? --     Constitutional: Alert and oriented x 4. Well appearing and in no acute distress. Eyes: Conjunctivae are normal. PERRL. EOMI. Head: Atraumatic. Nose: No congestion/rhinnorhea. Mouth/Throat: Mucous membranes are moist.  Oropharynx non-erythematous. Neck: No stridor.   Cardiovascular: Normal rate, regular rhythm. Grossly normal heart sounds.  Good peripheral circulation. Respiratory: Normal respiratory effort.  No retractions. Lungs CTAB. Gastrointestinal: Soft and nontender. No distention.  No CVA tenderness. Genitourinary: deferred Musculoskeletal: No lower extremity tenderness nor edema.  No joint effusions. Neurologic:  Normal speech and language. No gross focal neurologic deficits are appreciated. 5 out of 5 strength in bilateral upper and lower extremities, sensation intact to light touch throughout, cranial nerves II through XII intact, normal finger-nose-finger. Skin:  Skin is warm, dry and intact. No rash noted. Psychiatric: Mood and affect are normal. Speech and behavior are normal.  ____________________________________________   LABS (all labs ordered are listed, but only abnormal results are displayed)  Labs Reviewed  BASIC METABOLIC PANEL - Abnormal; Notable for the following:    BUN 42 (*)    Creatinine, Ser 1.33 (*)    GFR calc non Af Amer 32 (*)    GFR calc Af Amer 38 (*)    Anion gap 4 (*)    All other components within normal limits  CBC - Abnormal; Notable for the following:    RBC 3.59 (*)    Hemoglobin 11.7 (*)    HCT 34.4 (*)    RDW 14.9 (*)    Platelets 133 (*)     All other components within normal limits  URINALYSIS COMPLETEWITH MICROSCOPIC ([facility] ONLY) - Abnormal; Notable for the following:    Color, Urine YELLOW (*)    APPearance HAZY (*)    Leukocytes, UA TRACE (*)    Squamous Epithelial / LPF 6-30 (*)    All other components within normal limits  URINE CULTURE  TROPONIN I   ____________________________________________  EKG  ED ECG REPORT I, Joanne Gavel, the attending  physician, personally viewed and interpreted this ECG.   Date: 06/10/2015  EKG Time: 10:35  Rate: 63  Rhythm: normal sinus rhythm  Axis: normal  Intervals:none  ST&T Change: No acute ST elevation. No acute ST depression.  ____________________________________________  RADIOLOGY  CT head  IMPRESSION: Mild diffuse atrophy with mild periventricular small vessel disease. No intracranial mass, hemorrhage, or evidence of acute infarct.  CXR IMPRESSION: Enlargement of cardiac silhouette.  Emphysematous changes without definite infiltrate.  Enlargement of LEFT superior mediastinal soft tissues raising question of LEFT thyroid mass, goiter, adenopathy, or mass ; CT chest recommended for further assessment  ____________________________________________   PROCEDURES  Procedure(s) performed: None  Critical Care performed: No  ____________________________________________   INITIAL IMPRESSION / ASSESSMENT AND PLAN / ED COURSE  Pertinent labs & imaging results that were available during my care of the patient were reviewed by me and considered in my medical decision making (see chart for details).  Sandy Roberson is a 82 y.o. female with history of coronary artery disease, hypertension, frequent urinary tract infections, CHF, chronic kidney disease, presents for evaluation of resolved dizziness as well as syncope versus near-syncope. On Exam, she is very well-appearing and in no acute distress. He hypotensive on arrival with systolic blood pressure of 94  however this has improved to 157/76 with IV fluids. The remainder of her vital signs are stable, she is afebrile. She has a benign examination as well as an intact neurological exam. Suspect her dizziness and syncope versus near-syncope [place] secondary to orthostasis. No evidence of support acute CVA at this time. Plan for screening labs, chest x-ray, CT head, urinalysis, reassess for disposition.   ----------------------------------------- 3:54 PM on 06/10/2015 ----------------------------------------- The patient continues to appear well with no complaints. Labs reviewed. BMP with mild creatinine elevation at 1.33 however this appears to be her baseline. DC with mild anemia. Troponin negative. Urinalysis with trace leuks, several white blood cells therefore will treat empirically for urinary tract infection with Levaquin. She is already treated with prophylactic Macrobid and has a penicillin allergy which limits antibiotic choices. Chest x-ray shows no acute cardiopulmonary abnormality, there is a question of a thyroid mass however I discussed this with her son and he reports that this is known. CT head shows no acute intra-cranial process. Patient with normalization of her blood pressure after fluids. We'll DC with return precautions and close PCP follow-up. DC home.   ____________________________________________   FINAL CLINICAL IMPRESSION(S) / ED DIAGNOSES  Final diagnoses:  Dizziness  Syncope, unspecified syncope type  UTI (lower urinary tract infection)      Joanne Gavel, MD 06/10/15 1600

## 2015-06-10 NOTE — ED Notes (Signed)
Pt reports this morning she felt like she was going to pass out, but denies feeling lightheaded. She c/o feeling diaphoretic. Denies dizziness, but states she felt like things were moving.

## 2015-06-10 NOTE — ED Notes (Signed)
Pt arrived via EMS from home with son.  EMS states over the past couple of days she has been sneezing, but today has felt more weak today.  Pt reports she got very sweat and weak that started this  Morning.

## 2015-06-12 LAB — URINE CULTURE

## 2015-07-23 ENCOUNTER — Emergency Department
Admission: EM | Admit: 2015-07-23 | Discharge: 2015-07-23 | Disposition: A | Discharge status: Home / Self Care | Payer: Medicare Other | Attending: Emergency Medicine | Admitting: Emergency Medicine

## 2015-07-23 ENCOUNTER — Encounter: Payer: Self-pay | Admitting: Emergency Medicine

## 2015-07-23 DIAGNOSIS — R55 Syncope and collapse: Secondary | ICD-10-CM | POA: Insufficient documentation

## 2015-07-23 DIAGNOSIS — I25810 Atherosclerosis of coronary artery bypass graft(s) without angina pectoris: Secondary | ICD-10-CM | POA: Diagnosis not present

## 2015-07-23 DIAGNOSIS — Z955 Presence of coronary angioplasty implant and graft: Secondary | ICD-10-CM | POA: Insufficient documentation

## 2015-07-23 DIAGNOSIS — N390 Urinary tract infection, site not specified: Secondary | ICD-10-CM | POA: Diagnosis not present

## 2015-07-23 DIAGNOSIS — I110 Hypertensive heart disease with heart failure: Secondary | ICD-10-CM | POA: Insufficient documentation

## 2015-07-23 DIAGNOSIS — I5031 Acute diastolic (congestive) heart failure: Secondary | ICD-10-CM | POA: Diagnosis not present

## 2015-07-23 LAB — TROPONIN I: Troponin I: <0.03 ng/mL (ref <0.031)

## 2015-07-23 LAB — BASIC METABOLIC PANEL
ANION GAP: 4 — AB (ref 5–15)
BUN: 41 mg/dL — ABNORMAL HIGH (ref 6–20)
CHLORIDE: 98 mmol/L — AB (ref 101–111)
CO2: 29 mmol/L (ref 22–32)
Calcium: 8.9 mg/dL (ref 8.9–10.3)
Creatinine, Ser: 1.34 mg/dL — ABNORMAL HIGH (ref 0.44–1.00)
GFR calc non Af Amer: 32 mL/min — ABNORMAL LOW (ref >60)
GFR, EST AFRICAN AMERICAN: 37 mL/min — AB (ref >60)
Glucose, Bld: 125 mg/dL — ABNORMAL HIGH (ref 65–99)
POTASSIUM: 3.9 mmol/L (ref 3.5–5.1)
Sodium: 131 mmol/L — ABNORMAL LOW (ref 135–145)

## 2015-07-23 LAB — URINALYSIS COMPLETE WITH MICROSCOPIC (ARMC ONLY)
BILIRUBIN URINE: NEGATIVE
Bacteria, UA: NONE SEEN
Glucose, UA: NEGATIVE mg/dL
Hgb urine dipstick: NEGATIVE
KETONES UR: NEGATIVE mg/dL
Leukocytes, UA: NEGATIVE
Nitrite: NEGATIVE
PH: 7 (ref 5.0–8.0)
Protein, ur: NEGATIVE mg/dL
Specific Gravity, Urine: 1.013 (ref 1.005–1.030)

## 2015-07-23 LAB — CBC
HCT: 35.4 % (ref 35.0–47.0)
HEMOGLOBIN: 12 g/dL (ref 12.0–16.0)
MCH: 32.5 pg (ref 26.0–34.0)
MCHC: 33.9 g/dL (ref 32.0–36.0)
MCV: 95.8 fL (ref 80.0–100.0)
Platelets: 133 10*3/uL — ABNORMAL LOW (ref 150–440)
RBC: 3.69 MIL/uL — AB (ref 3.80–5.20)
RDW: 14 % (ref 11.5–14.5)
WBC: 3.9 10*3/uL (ref 3.6–11.0)

## 2015-07-23 NOTE — ED Notes (Signed)
Patient arrived via EMS from home with her son.  EMS reports patient had syncopal episode at home and was caught by son who lowered her to the floor.  Patient has hx of syncope associated with UTI.  Patient has frequent UTIs and is on prophylatic antibiotic for UTIs.  Reported that patient had oxygen saturation of 88% on RA and was placed on 2L via [place] by EMS.  Patient currently 98% on RA and is in now respiratory distress.  Denies any other complaints

## 2015-07-23 NOTE — ED Notes (Signed)
Patient's son Sandy Roberson contacted and will be here to transport patient home after discharge.

## 2015-07-23 NOTE — ED Notes (Signed)
Pt assisted with bed pan by this tech, pt linen changed and pt sitting up in bed at this time resting comfortably

## 2015-07-23 NOTE — ED Provider Notes (Signed)
[facility] Emergency Department Provider Note  ____________________________________________  Time seen: ~1135  I have reviewed the triage vital signs and the nursing notes.   HISTORY  Chief Complaint Loss of Consciousness   History limited by: Not Limited   HPI Sandy Roberson is a 82 y.o. female who presents to the emergency department today via EMS after a syncopal episode. The patient states that when she woke up this morning she felt a little off. She did not have any chest pain palpitations or shortness of breath. She states that she then had a syncopal episode. She does not recall any abnormal feelings before. She does not think she hurt herself after the fall. She states she has had a syncopal episode in the past and it has occured with a urinary tract infection.She denies any recent fevers.     Past Medical History  Diagnosis Date  . Coronary artery disease   . Hypertension     Patient Active Problem List   Diagnosis Date Noted  . ITP (idiopathic thrombocytopenic purpura) 09/25/2014  . ARF (acute renal failure) ([place]) 09/25/2014  . Hypokalemia 09/25/2014  . Acute diastolic CHF (congestive heart failure) ([place]) 09/25/2014  . Sepsis due to urinary tract infection ([place]) 09/19/2014  . Hyponatremia 09/19/2014    Past Surgical History  Procedure Laterality Date  . Coronary stent placement    . Stent placement rt ureter ([facility] hx)      since been removed     Current Outpatient Rx  Name  Route  Sig  Dispense  Refill  . calcitRIOL (ROCALTROL) 0.25 MCG capsule   Oral   Take 1 capsule by mouth daily.         . carvedilol (COREG) 3.125 MG tablet   Oral   Take 1 tablet (3.125 mg total) by mouth 2 (two) times daily with a meal.   60 tablet   1   . methimazole (TAPAZOLE) 5 MG tablet   Oral   Take 1 tablet by mouth daily.         . nitrofurantoin, macrocrystal-monohydrate, (MACROBID) 100 MG capsule   Oral   Take 100 mg by mouth  daily.         . cephALEXin (KEFLEX) 500 MG capsule   Oral   Take 1 capsule (500 mg total) by mouth 2 (two) times daily.   8 capsule   0   . fosfomycin (MONUROL) 3 G PACK   Oral   Take 3 g by mouth once.   3 g   0   . furosemide (LASIX) 20 MG tablet   Oral   Take 1 tablet (20 mg total) by mouth daily.   30 tablet   2   . predniSONE (DELTASONE) 20 MG tablet   Oral   Take 2 tablets (40 mg total) by mouth daily with breakfast.   15 tablet   0   . simvastatin (ZOCOR) 40 MG tablet   Oral   Take 1 tablet by mouth at bedtime.           Allergies Penicillins  Family History  Problem Relation Age of Onset  . Heart failure Neg Hx     Social History Social History  Substance Use Topics  . Smoking status: Never Smoker   . Smokeless tobacco: None  . Alcohol Use: No    Review of Systems  Constitutional: Negative for fever. Cardiovascular: Negative for chest pain. Respiratory: Negative for shortness of breath. Gastrointestinal: Negative for abdominal pain,  vomiting and diarrhea. Neurological: Negative for headaches, focal weakness or numbness.  10-point ROS otherwise negative.  ____________________________________________   PHYSICAL EXAM:  VITAL SIGNS: ED Triage Vitals  Enc Vitals Group     BP --      Pulse Rate 07/23/15 1046 66     Resp 07/23/15 1046 14     Temp 07/23/15 1046 97.8 F (36.6 C)     Temp Source 07/23/15 1046 Oral     SpO2 07/23/15 1046 99 %     Weight 07/23/15 1046 123 lb 12.8 oz (56.155 kg)     Height 07/23/15 1046 5\' 2"  (1.575 m)     Head Cir --      Peak Flow --      Pain Score 07/23/15 1048 0   Constitutional: Alert and oriented. Well appearing and in no distress. Eyes: Conjunctivae are normal. PERRL. Normal extraocular movements. ENT   Head: Normocephalic and atraumatic.   Nose: No congestion/rhinnorhea.   Mouth/Throat: Mucous membranes are moist.   Neck: No stridor. Hematological/Lymphatic/Immunilogical: No  cervical lymphadenopathy. Cardiovascular: Normal rate, regular rhythm.  No murmurs, rubs, or gallops. Respiratory: Normal respiratory effort without tachypnea nor retractions. Breath sounds are clear and equal bilaterally. No wheezes/rales/rhonchi. Gastrointestinal: Soft and nontender. No distention.  Genitourinary: Deferred Musculoskeletal: Normal range of motion in all extremities. No joint effusions.  No lower extremity tenderness nor edema. Neurologic:  Normal speech and language. No gross focal neurologic deficits are appreciated.  Skin:  Skin is warm, dry and intact. No rash noted. Psychiatric: Mood and affect are normal. Speech and behavior are normal. Patient exhibits appropriate insight and judgment.  ____________________________________________    LABS (pertinent positives/negatives)  Labs Reviewed  BASIC METABOLIC PANEL - Abnormal; Notable for the following:    Sodium 131 (*)    Chloride 98 (*)    Glucose, Bld 125 (*)    BUN 41 (*)    Creatinine, Ser 1.34 (*)    GFR calc non Af Amer 32 (*)    GFR calc Af Amer 37 (*)    Anion gap 4 (*)    All other components within normal limits  CBC - Abnormal; Notable for the following:    RBC 3.69 (*)    Platelets 133 (*)    All other components within normal limits  URINALYSIS COMPLETEWITH MICROSCOPIC ([facility] ONLY) - Abnormal; Notable for the following:    Color, Urine YELLOW (*)    APPearance CLEAR (*)    Squamous Epithelial / LPF 0-5 (*)    All other components within normal limits  TROPONIN I  TROPONIN I     ____________________________________________   EKG  I, Nance Pear, attending physician, personally viewed and interpreted this EKG  EKG Time: 1044 Rate: 64 Rhythm: normal sinus rhythm Axis: normal Intervals: qtc 440 QRS: no specific intraventricular conduction delay ST changes: no st elevation Impression: abnormal ekg  ____________________________________________     RADIOLOGY  None  ____________________________________________   PROCEDURES  Procedure(s) performed: None  Critical Care performed: No  ____________________________________________   INITIAL IMPRESSION / ASSESSMENT AND PLAN / ED COURSE  Pertinent labs & imaging results that were available during my care of the patient were reviewed by me and considered in my medical decision making (see chart for details).  Patient presented to the emergency department today because of concerns for syncopal episode. Blood work and 2 sets of troponin negative. At this point think likely vasovagal episode of syncope. Will plan on discharge home with PCP follow  up.  ____________________________________________   FINAL CLINICAL IMPRESSION(S) / ED DIAGNOSES  Final diagnoses:  Syncope and collapse     Nance Pear, MD 07/23/15 1521

## 2015-07-23 NOTE — ED Notes (Signed)
Son states he will return later.  Would like updates on mother's condition.

## 2015-07-23 NOTE — Discharge Instructions (Signed)
Please seek medical attention for any high fevers, chest pain, shortness of breath, change in behavior, persistent vomiting, bloody stool or any other new or concerning symptoms.   Syncope Syncope means a person passes out (faints). The person usually wakes up in less than 5 minutes. It is important to seek medical care for syncope. HOME CARE  Have someone stay with you until you feel normal.  Do not drive, use machines, or play sports until your doctor says it is okay.  Keep all doctor visits as told.  Lie down when you feel like you might pass out. Take deep breaths. Wait until you feel normal before standing up.  Drink enough fluids to keep your pee (urine) clear or pale yellow.  If you take blood pressure or heart medicine, get up slowly. Take several minutes to sit and then stand. GET HELP RIGHT AWAY IF:   You have a severe headache.  You have pain in the chest, belly (abdomen), or back.  You are bleeding from the mouth or butt (rectum).  You have black or tarry poop (stool).  You have an irregular or very fast heartbeat.  You have pain with breathing.  You keep passing out, or you have shaking (seizures) when you pass out.  You pass out when sitting or lying down.  You feel confused.  You have trouble walking.  You have severe weakness.  You have vision problems. If you fainted, call for help (911 in [place] Do not drive yourself to the hospital.   This information is not intended to replace advice given to you by your health care provider. Make sure you discuss any questions you have with your health care provider.   Document Released: 09/19/2007 Document Revised: 08/17/2014 Document Reviewed: 06/01/2011 Elsevier Interactive Patient Education [employer].

## 2015-07-23 NOTE — ED Notes (Signed)
Patient reports pain in the right shoulder that is muscular and has been occuring for several weeks.

## 2015-08-12 DIAGNOSIS — Z8639 Personal history of other endocrine, nutritional and metabolic disease: Secondary | ICD-10-CM | POA: Insufficient documentation

## 2016-02-17 DIAGNOSIS — Z23 Encounter for immunization: Secondary | ICD-10-CM | POA: Insufficient documentation

## 2016-04-05 ENCOUNTER — Encounter: Payer: Self-pay | Admitting: Emergency Medicine

## 2016-04-05 ENCOUNTER — Emergency Department: Payer: Medicare Other

## 2016-04-05 ENCOUNTER — Observation Stay
Admission: EM | Admit: 2016-04-05 | Discharge: 2016-04-06 | Disposition: A | Discharge status: Home / Self Care | Payer: Medicare Other | Attending: Internal Medicine | Admitting: Internal Medicine

## 2016-04-05 DIAGNOSIS — N183 Chronic kidney disease, stage 3 (moderate): Secondary | ICD-10-CM | POA: Insufficient documentation

## 2016-04-05 DIAGNOSIS — J449 Chronic obstructive pulmonary disease, unspecified: Secondary | ICD-10-CM | POA: Insufficient documentation

## 2016-04-05 DIAGNOSIS — I130 Hypertensive heart and chronic kidney disease with heart failure and stage 1 through stage 4 chronic kidney disease, or unspecified chronic kidney disease: Secondary | ICD-10-CM | POA: Diagnosis not present

## 2016-04-05 DIAGNOSIS — Z79899 Other long term (current) drug therapy: Secondary | ICD-10-CM | POA: Insufficient documentation

## 2016-04-05 DIAGNOSIS — R2681 Unsteadiness on feet: Secondary | ICD-10-CM

## 2016-04-05 DIAGNOSIS — I2510 Atherosclerotic heart disease of native coronary artery without angina pectoris: Secondary | ICD-10-CM | POA: Diagnosis not present

## 2016-04-05 DIAGNOSIS — Z7952 Long term (current) use of systemic steroids: Secondary | ICD-10-CM | POA: Insufficient documentation

## 2016-04-05 DIAGNOSIS — Z955 Presence of coronary angioplasty implant and graft: Secondary | ICD-10-CM | POA: Diagnosis not present

## 2016-04-05 DIAGNOSIS — I5031 Acute diastolic (congestive) heart failure: Secondary | ICD-10-CM | POA: Insufficient documentation

## 2016-04-05 DIAGNOSIS — R0602 Shortness of breath: Secondary | ICD-10-CM | POA: Diagnosis present

## 2016-04-05 DIAGNOSIS — R7989 Other specified abnormal findings of blood chemistry: Secondary | ICD-10-CM

## 2016-04-05 DIAGNOSIS — R262 Difficulty in walking, not elsewhere classified: Secondary | ICD-10-CM

## 2016-04-05 DIAGNOSIS — J101 Influenza due to other identified influenza virus with other respiratory manifestations: Secondary | ICD-10-CM | POA: Diagnosis not present

## 2016-04-05 DIAGNOSIS — R778 Other specified abnormalities of plasma proteins: Secondary | ICD-10-CM | POA: Diagnosis not present

## 2016-04-05 DIAGNOSIS — R531 Weakness: Secondary | ICD-10-CM | POA: Diagnosis present

## 2016-04-05 DIAGNOSIS — J40 Bronchitis, not specified as acute or chronic: Secondary | ICD-10-CM

## 2016-04-05 HISTORY — DX: Disorder of kidney and ureter, unspecified: N28.9

## 2016-04-05 LAB — CBC
HEMATOCRIT: 35.4 % (ref 35.0–47.0)
HEMOGLOBIN: 12.6 g/dL (ref 12.0–16.0)
MCH: 33.2 pg (ref 26.0–34.0)
MCHC: 35.5 g/dL (ref 32.0–36.0)
MCV: 93.5 fL (ref 80.0–100.0)
Platelets: 130 10*3/uL — ABNORMAL LOW (ref 150–440)
RBC: 3.79 MIL/uL — ABNORMAL LOW (ref 3.80–5.20)
RDW: 14.3 % (ref 11.5–14.5)
WBC: 8.8 10*3/uL (ref 3.6–11.0)

## 2016-04-05 LAB — BASIC METABOLIC PANEL
ANION GAP: 10 (ref 5–15)
BUN: 28 mg/dL — AB (ref 6–20)
CALCIUM: 9 mg/dL (ref 8.9–10.3)
CO2: 23 mmol/L (ref 22–32)
Chloride: 98 mmol/L — ABNORMAL LOW (ref 101–111)
Creatinine, Ser: 1.41 mg/dL — ABNORMAL HIGH (ref 0.44–1.00)
GFR calc Af Amer: 35 mL/min — ABNORMAL LOW (ref >60)
GFR, EST NON AFRICAN AMERICAN: 30 mL/min — AB (ref >60)
GLUCOSE: 120 mg/dL — AB (ref 65–99)
Potassium: 4.1 mmol/L (ref 3.5–5.1)
Sodium: 131 mmol/L — ABNORMAL LOW (ref 135–145)

## 2016-04-05 LAB — INFLUENZA PANEL BY PCR (TYPE A & B)
INFLAPCR: POSITIVE — AB
INFLBPCR: NEGATIVE

## 2016-04-05 LAB — TROPONIN I
TROPONIN I: 0.24 ng/mL — AB (ref <0.03)
Troponin I: 0.12 ng/mL (ref <0.03)

## 2016-04-05 MED ORDER — SODIUM CHLORIDE 0.9 % IV SOLN
INTRAVENOUS | Status: DC
  Administered 2016-04-06: 05:00:00 via INTRAVENOUS

## 2016-04-05 MED ORDER — GUAIFENESIN ER 600 MG PO TB12
600.0000 mg | ORAL_TABLET | Freq: Two times a day (BID) | ORAL | Status: DC
  Administered 2016-04-05 – 2016-04-06 (×2): 600 mg via ORAL
  Filled 2016-04-05 (×2): qty 1

## 2016-04-05 MED ORDER — METHYLPREDNISOLONE SODIUM SUCC 40 MG IJ SOLR
40.0000 mg | Freq: Two times a day (BID) | INTRAMUSCULAR | Status: DC
  Administered 2016-04-06: 40 mg via INTRAVENOUS
  Filled 2016-04-05: qty 1

## 2016-04-05 MED ORDER — ONDANSETRON HCL 4 MG PO TABS: 4.0000 mg | ORAL_TABLET | Freq: Four times a day (QID) | ORAL | Status: DC | PRN

## 2016-04-05 MED ORDER — ENOXAPARIN SODIUM 30 MG/0.3ML ~~LOC~~ SOLN
30.0000 mg | SUBCUTANEOUS | Status: DC
  Administered 2016-04-05: 30 mg via SUBCUTANEOUS
  Filled 2016-04-05: qty 0.3

## 2016-04-05 MED ORDER — CALCITRIOL 0.25 MCG PO CAPS
0.2500 ug | ORAL_CAPSULE | Freq: Every day | ORAL | Status: DC
  Administered 2016-04-06: 0.25 ug via ORAL
  Filled 2016-04-05: qty 1

## 2016-04-05 MED ORDER — SIMVASTATIN 40 MG PO TABS
40.0000 mg | ORAL_TABLET | Freq: Every day | ORAL | Status: DC
  Administered 2016-04-05: 40 mg via ORAL
  Filled 2016-04-05: qty 1

## 2016-04-05 MED ORDER — SODIUM CHLORIDE 0.9% FLUSH
3.0000 mL | Freq: Two times a day (BID) | INTRAVENOUS | Status: DC
  Administered 2016-04-05 – 2016-04-06 (×2): 3 mL via INTRAVENOUS

## 2016-04-05 MED ORDER — ACETAMINOPHEN 650 MG RE SUPP: 650.0000 mg | Freq: Four times a day (QID) | RECTAL | Status: DC | PRN

## 2016-04-05 MED ORDER — CARVEDILOL 3.125 MG PO TABS
3.1250 mg | ORAL_TABLET | Freq: Two times a day (BID) | ORAL | Status: DC
  Administered 2016-04-06: 3.125 mg via ORAL
  Filled 2016-04-05: qty 1

## 2016-04-05 MED ORDER — ONDANSETRON HCL 4 MG/2ML IJ SOLN: 4.0000 mg | Freq: Four times a day (QID) | INTRAMUSCULAR | Status: DC | PRN

## 2016-04-05 MED ORDER — ASPIRIN 325 MG PO TABS
325.0000 mg | ORAL_TABLET | Freq: Every day | ORAL | Status: DC
  Administered 2016-04-06: 325 mg via ORAL
  Filled 2016-04-05 (×2): qty 1

## 2016-04-05 MED ORDER — METHIMAZOLE 5 MG PO TABS
5.0000 mg | ORAL_TABLET | Freq: Every day | ORAL | Status: DC
  Administered 2016-04-05 – 2016-04-06 (×2): 5 mg via ORAL
  Filled 2016-04-05 (×2): qty 1

## 2016-04-05 MED ORDER — ACETAMINOPHEN 325 MG PO TABS: 650.0000 mg | ORAL_TABLET | Freq: Four times a day (QID) | ORAL | Status: DC | PRN

## 2016-04-05 MED ORDER — ALBUTEROL SULFATE (2.5 MG/3ML) 0.083% IN NEBU
7.5000 mg | INHALATION_SOLUTION | Freq: Once | RESPIRATORY_TRACT | Status: AC
  Administered 2016-04-05: 7.5 mg via RESPIRATORY_TRACT
  Filled 2016-04-05: qty 9

## 2016-04-05 MED ORDER — FUROSEMIDE 20 MG PO TABS
20.0000 mg | ORAL_TABLET | Freq: Every day | ORAL | Status: DC
  Administered 2016-04-05: 20 mg via ORAL
  Filled 2016-04-05: qty 1

## 2016-04-05 MED ORDER — IPRATROPIUM-ALBUTEROL 0.5-2.5 (3) MG/3ML IN SOLN
3.0000 mL | Freq: Four times a day (QID) | RESPIRATORY_TRACT | Status: DC
  Administered 2016-04-06 (×2): 3 mL via RESPIRATORY_TRACT
  Filled 2016-04-05 (×3): qty 3

## 2016-04-05 MED ORDER — CARVEDILOL 6.25 MG PO TABS: 6.2500 mg | ORAL_TABLET | Freq: Two times a day (BID) | ORAL | Status: DC

## 2016-04-05 MED ORDER — METHYLPREDNISOLONE SODIUM SUCC 125 MG IJ SOLR
125.0000 mg | Freq: Once | INTRAMUSCULAR | Status: AC
  Administered 2016-04-05: 125 mg via INTRAVENOUS
  Filled 2016-04-05: qty 2

## 2016-04-05 NOTE — Progress Notes (Signed)
Anticoagulation monitoring(Lovenox):  82 yo female ordered Lovenox 40 mg Q24h  Filed Weights   04/05/16 1155  Weight: 118 lb (53.5 kg)   BMI    Lab Results  Component Value Date   CREATININE 1.41 (H) 04/05/2016   CREATININE 1.34 (H) 07/23/2015   CREATININE 1.33 (H) 06/10/2015   Estimated Creatinine Clearance: 18.8 mL/min (by C-G formula based on SCr of 1.41 mg/dL (H)). Hemoglobin & Hematocrit     Component Value Date/Time   HGB 12.6 04/05/2016 1229   HCT 35.4 04/05/2016 1229     Per Protocol for Patient with estCrcl < 30 ml/min and BMI < 40, will transition to Lovenox 30 mg Q24h.

## 2016-04-05 NOTE — ED Notes (Signed)
Report given to [place], Therapist, sports.

## 2016-04-05 NOTE — H&P (Signed)
[facility] NAME: Sandy Roberson    MR#:  [id number]  DATE OF BIRTH:  08-30-1915  DATE OF ADMISSION:  04/05/2016  PRIMARY CARE PHYSICIAN: Tracie Harrier, MD   REQUESTING/REFERRING PHYSICIAN: Lucita Lora MD  CHIEF COMPLAINT:   Chief Complaint  Patient presents with  . Weakness    HISTORY OF PRESENT ILLNESS: Sandy Roberson  is a 82 y.o. female with a known history of On her yard disease, essential hypertension , chronic kidney disease who is presenting with generalized weakness cough and congestion. Patient has been treated for acute bronchitis as outpatient without no significant improvement. She has had a productive cough as well as wheezing and progressive shortness of breath. She also has been very weak and has had difficulty with walking. She denies any chest pain or palpitations. Denies any nausea vomiting or diarrhea. Has lack of appetite.  PAST MEDICAL HISTORY:   Past Medical History:  Diagnosis Date  . Coronary artery disease   . Hypertension   . Renal disorder     PAST SURGICAL HISTORY: Past Surgical History:  Procedure Laterality Date  . CORONARY STENT PLACEMENT    . FRACTURE SURGERY    . STENT PLACEMENT RT URETER ([place] HX)     since been removed     SOCIAL HISTORY:  Social History  Substance Use Topics  . Smoking status: Never Smoker  . Smokeless tobacco: Never Used  . Alcohol use No    FAMILY HISTORY:  Family History  Problem Relation Age of Onset  . Heart failure Neg Hx     DRUG ALLERGIES:  Allergies  Allergen Reactions  . Penicillins Rash    REVIEW OF SYSTEMS:   CONSTITUTIONAL: No fever,  postive fatigue and weakness.  EYES: No blurred or double vision.  EARS, NOSE, AND THROAT: No tinnitus or ear pain.  RESPIRATORY: postive cough, postive shortness of breath, postive  wheezing or no hemoptysis.  CARDIOVASCULAR: No chest pain, orthopnea, edema.  GASTROINTESTINAL: No nausea, vomiting,  diarrhea or abdominal pain.  GENITOURINARY: No dysuria, hematuria.  ENDOCRINE: No polyuria, nocturia,  HEMATOLOGY: No anemia, easy bruising or bleeding SKIN: No rash or lesion. MUSCULOSKELETAL: No joint pain or arthritis.   NEUROLOGIC: No tingling, numbness, weakness.  PSYCHIATRY: No anxiety or depression.   MEDICATIONS AT HOME:  Prior to Admission medications   Medication Sig Start Date End Date Taking? Authorizing Provider  calcitRIOL (ROCALTROL) 0.25 MCG capsule Take 1 capsule by mouth daily. Daily at 1800 10/20/12   Historical Provider, MD  carvedilol (COREG) 3.125 MG tablet Take 1 tablet (3.125 mg total) by mouth 2 (two) times daily with a meal. Patient not taking: Reported on 07/23/2015 09/25/14   Gladstone Lighter, MD  carvedilol (COREG) 6.25 MG tablet Take 6.25 mg by mouth 2 (two) times daily with a meal.    Historical Provider, MD  cephALEXin (KEFLEX) 500 MG capsule Take 1 capsule (500 mg total) by mouth 2 (two) times daily. 09/25/14   Gladstone Lighter, MD  fosfomycin (MONUROL) 3 G PACK Take 3 g by mouth once. 12/21/14   Delman Kitten, MD  furosemide (LASIX) 20 MG tablet Take 1 tablet (20 mg total) by mouth daily. 09/25/14   Gladstone Lighter, MD  methimazole (TAPAZOLE) 5 MG tablet Take 1 tablet by mouth daily.    Historical Provider, MD  predniSONE (DELTASONE) 20 MG tablet Take 2 tablets (40 mg total) by mouth daily with breakfast. 09/25/14   Gladstone Lighter, MD  simvastatin (ZOCOR) 40  MG tablet Take 1 tablet by mouth at bedtime.    Historical Provider, MD      PHYSICAL EXAMINATION:   VITAL SIGNS: Blood pressure 121/68, pulse 86, temperature 98.7 F (37.1 C), temperature source Oral, resp. rate 18, height 5\' 7"  (1.702 m), weight 118 lb (53.5 kg), SpO2 98 %.  GENERAL:  82 y.o.-year-old patient lying in the bed with no acute distress.  EYES: Pupils equal, round, reactive to light and accommodation. No scleral icterus. Extraocular muscles intact.  HEENT: Head atraumatic, normocephalic.  Oropharynx and nasopharynx clear.  NECK:  Supple, no jugular venous distention. No thyroid enlargement, no tenderness.  LUNGS: Bilateral wheezing both lungs, , rales,rhonchi or crepitation. No use of accessory muscles of respiration.  CARDIOVASCULAR: S1, S2 normal. No murmurs, rubs, or gallops.  ABDOMEN: Soft, nontender, nondistended. Bowel sounds present. No organomegaly or mass.  EXTREMITIES: No pedal edema, cyanosis, or clubbing.  NEUROLOGIC: Cranial nerves II through XII are intact. Muscle strength 5/5 in all extremities. Sensation intact. Gait not checked.  PSYCHIATRIC: The patient is alert and oriented x 3.  SKIN: No obvious rash, lesion, or ulcer.   LABORATORY PANEL:   CBC  Recent Labs Lab 04/05/16 1229  WBC 8.8  HGB 12.6  HCT 35.4  PLT 130*  MCV 93.5  MCH 33.2  MCHC 35.5  RDW 14.3   ------------------------------------------------------------------------------------------------------------------  Chemistries   Recent Labs Lab 04/05/16 1229  NA 131*  K 4.1  CL 98*  CO2 23  GLUCOSE 120*  BUN 28*  CREATININE 1.41*  CALCIUM 9.0   ------------------------------------------------------------------------------------------------------------------ estimated creatinine clearance is 18.8 mL/min (by C-G formula based on SCr of 1.41 mg/dL (H)). ------------------------------------------------------------------------------------------------------------------ No results for input(s): TSH, T4TOTAL, T3FREE, THYROIDAB in the last 72 hours.  Invalid input(s): FREET3   Coagulation profile No results for input(s): INR, PROTIME in the last 168 hours. ------------------------------------------------------------------------------------------------------------------- No results for input(s): DDIMER in the last 72 hours. -------------------------------------------------------------------------------------------------------------------  Cardiac Enzymes  Recent Labs Lab  04/05/16 1229  TROPONINI 0.24*   ------------------------------------------------------------------------------------------------------------------ Invalid input(s): POCBNP  ---------------------------------------------------------------------------------------------------------------  Urinalysis    Component Value Date/Time   COLORURINE YELLOW (A) 07/23/2015 1051   APPEARANCEUR CLEAR (A) 07/23/2015 1051   LABSPEC 1.013 07/23/2015 1051   PHURINE 7.0 07/23/2015 1051   GLUCOSEU NEGATIVE 07/23/2015 1051   HGBUR NEGATIVE 07/23/2015 1051   BILIRUBINUR NEGATIVE 07/23/2015 1051   KETONESUR NEGATIVE 07/23/2015 1051   PROTEINUR NEGATIVE 07/23/2015 1051   NITRITE NEGATIVE 07/23/2015 1051   LEUKOCYTESUR NEGATIVE 07/23/2015 1051     RADIOLOGY: Dg Chest 2 View  Result Date: 04/05/2016 CLINICAL DATA:  Cough.  Decreased breath sounds EXAM: CHEST  2 VIEW COMPARISON:  06/10/2015 FINDINGS: COPD with pulmonary hyperinflation. Negative for pneumonia. Cardiac enlargement without heart failure. Left peritracheal soft tissue mass with deviation of the trachea to the right. This could represent a goiter versus adenopathy. This is unchanged from the prior study. This is also present on 09/19/2014 and appear stable. IMPRESSION: COPD Cardiac enlargement without heart failure. Soft tissue mass displacing the trachea to the right unchanged from prior studies, probable goiter. Electronically Signed   By: Franchot Gallo M.D.   On: 04/05/2016 15:11    EKG: Orders placed or performed during the hospital encounter of 04/05/16  . ED EKG  . ED EKG    IMPRESSION AND PLAN: Patient is a 82 year old being admitted with cough and congestion weakness  1. Acute bronchitis with acute bronchospasm Treat with nebulizers and steroids Oral antibiotics, Mucinex  2. Elevated troponin suspected  due to demand ischemia follow troponin levels Place on aspirin and telemetry  3. Generalized weakness will have patient be  seen by physical therapy  4. Chronic kidney disease 3 we'll monitor renal function  5. Misc: lovenox for dvt proph    All the records are reviewed and case discussed with ED provider. Management plans discussed with the patient, family and they are in agreement.  CODE STATUS: Code Status History    Date Active Date Inactive Code Status Order ID Comments User Context   09/19/2014 11:14 PM 09/25/2014  5:57 PM Full Code [id number]  Lytle Butte, MD ED    Advance Directive Documentation   Flowsheet Row Most Recent Value  Type of Advance Directive  Healthcare Power of Attorney, Living will  Pre-existing out of facility DNR order (yellow form or pink MOST form)  No data  "MOST" Form in Place?  No data       TOTAL TIME TAKING CARE OF THIS PATIENT:55 minutes.    Dustin Flock M.D on 04/05/2016 at 4:06 PM  Between 7am to 6pm - Pager - [phone]  After 6pm go to [email or web address] - password [email or web address] [facility]  Office  [phone]  CC: Primary care physician; Tracie Harrier, MD

## 2016-04-05 NOTE — H&P (Signed)
[facility] [place] NAME: Sandy Roberson    MR#:  [id number]  DATE OF BIRTH:  1916/01/07  DATE OF ADMISSION:  04/05/2016  PRIMARY CARE PHYSICIAN: Tracie Harrier, MD   REQUESTING/REFERRING PHYSICIAN:   CHIEF COMPLAINT:   Chief Complaint  Patient presents with  . Weakness    HISTORY OF PRESENT ILLNESS: Sandy Roberson  is a 82 y.o. female with a known history of  Coronary artery disease, essential hypertension, chronic kidney disease who is presenting with complaint of weakness cough and congestion. She reports that she has not been feeling well for the past few days. And has been treated with outpatient antibiotics for acute bronchitis. Patient continued to still not improved therefore she comes to the emergency room with the symptoms. Patient's chest x-rays negative her troponin is elevated. Therefore were asked to admit the patient. PAST MEDICAL HISTORY:   Past Medical History:  Diagnosis Date  . Coronary artery disease   . Hypertension   . Renal disorder     PAST SURGICAL HISTORY: Past Surgical History:  Procedure Laterality Date  . CORONARY STENT PLACEMENT    . FRACTURE SURGERY    . STENT PLACEMENT RT URETER ([place] HX)     since been removed     SOCIAL HISTORY:  Social History  Substance Use Topics  . Smoking status: Never Smoker  . Smokeless tobacco: Never Used  . Alcohol use No    FAMILY HISTORY:  Family History  Problem Relation Age of Onset  . Heart failure Neg Hx     DRUG ALLERGIES:  Allergies  Allergen Reactions  . Penicillins Rash    REVIEW OF SYSTEMS:   CONSTITUTIONAL: No fever, fatigue or weakness.  EYES: No blurred or double vision.  EARS, NOSE, AND THROAT: No tinnitus or ear pain.  RESPIRATORY: Positive cough, shortness of breath, wheezing or no hemoptysis.  CARDIOVASCULAR: No chest pain, orthopnea, edema.  GASTROINTESTINAL: No nausea, vomiting, diarrhea or abdominal pain.  GENITOURINARY:  No dysuria, hematuria.  ENDOCRINE: No polyuria, nocturia,  HEMATOLOGY: No anemia, easy bruising or bleeding SKIN: No rash or lesion. MUSCULOSKELETAL: No joint pain or arthritis.   NEUROLOGIC: No tingling, numbness, weakness.  PSYCHIATRY: No anxiety or depression.   MEDICATIONS AT HOME:  Prior to Admission medications   Medication Sig Start Date End Date Taking? Authorizing Provider  calcitRIOL (ROCALTROL) 0.25 MCG capsule Take 1 capsule by mouth daily. Daily at 1800 10/20/12   Historical Provider, MD  carvedilol (COREG) 3.125 MG tablet Take 1 tablet (3.125 mg total) by mouth 2 (two) times daily with a meal. Patient not taking: Reported on 07/23/2015 09/25/14   Gladstone Lighter, MD  carvedilol (COREG) 6.25 MG tablet Take 6.25 mg by mouth 2 (two) times daily with a meal.    Historical Provider, MD  cephALEXin (KEFLEX) 500 MG capsule Take 1 capsule (500 mg total) by mouth 2 (two) times daily. 09/25/14   Gladstone Lighter, MD  fosfomycin (MONUROL) 3 G PACK Take 3 g by mouth once. 12/21/14   Delman Kitten, MD  furosemide (LASIX) 20 MG tablet Take 1 tablet (20 mg total) by mouth daily. 09/25/14   Gladstone Lighter, MD  methimazole (TAPAZOLE) 5 MG tablet Take 1 tablet by mouth daily.    Historical Provider, MD  predniSONE (DELTASONE) 20 MG tablet Take 2 tablets (40 mg total) by mouth daily with breakfast. 09/25/14   Gladstone Lighter, MD  simvastatin (ZOCOR) 40 MG tablet Take 1 tablet by mouth at bedtime.  Historical Provider, MD      PHYSICAL EXAMINATION:   VITAL SIGNS: Blood pressure 121/68, pulse 86, temperature 98.7 F (37.1 C), temperature source Oral, resp. rate 18, height 5\' 7"  (1.702 m), weight 118 lb (53.5 kg), SpO2 98 %.  GENERAL:  82 y.o.-year-old patient lying in the bed with no acute distress.  EYES: Pupils equal, round, reactive to light and accommodation. No scleral icterus. Extraocular muscles intact.  HEENT: Head atraumatic, normocephalic. Oropharynx and nasopharynx clear.  NECK:   Supple, no jugular venous distention. No thyroid enlargement, no tenderness.  LUNGS: Bilateral wheezing, rales,rhonchi or crepitation. No use of accessory muscles of respiration.  CARDIOVASCULAR: S1, S2 normal. No murmurs, rubs, or gallops.  ABDOMEN: Soft, nontender, nondistended. Bowel sounds present. No organomegaly or mass.  EXTREMITIES: No pedal edema, cyanosis, or clubbing.  NEUROLOGIC: Cranial nerves II through XII are intact. Muscle strength 5/5 in all extremities. Sensation intact. Gait not checked.  PSYCHIATRIC: The patient is alert and oriented x 3.  SKIN: No obvious rash, lesion, or ulcer.   LABORATORY PANEL:   CBC  Recent Labs Lab 04/05/16 1229  WBC 8.8  HGB 12.6  HCT 35.4  PLT 130*  MCV 93.5  MCH 33.2  MCHC 35.5  RDW 14.3   ------------------------------------------------------------------------------------------------------------------  Chemistries   Recent Labs Lab 04/05/16 1229  NA 131*  K 4.1  CL 98*  CO2 23  GLUCOSE 120*  BUN 28*  CREATININE 1.41*  CALCIUM 9.0   ------------------------------------------------------------------------------------------------------------------ estimated creatinine clearance is 18.8 mL/min (by C-G formula based on SCr of 1.41 mg/dL (H)). ------------------------------------------------------------------------------------------------------------------ No results for input(s): TSH, T4TOTAL, T3FREE, THYROIDAB in the last 72 hours.  Invalid input(s): FREET3   Coagulation profile No results for input(s): INR, PROTIME in the last 168 hours. ------------------------------------------------------------------------------------------------------------------- No results for input(s): DDIMER in the last 72 hours. -------------------------------------------------------------------------------------------------------------------  Cardiac Enzymes  Recent Labs Lab 04/05/16 1229  TROPONINI 0.24*    ------------------------------------------------------------------------------------------------------------------ Invalid input(s): POCBNP  ---------------------------------------------------------------------------------------------------------------  Urinalysis    Component Value Date/Time   COLORURINE YELLOW (A) 07/23/2015 1051   APPEARANCEUR CLEAR (A) 07/23/2015 1051   LABSPEC 1.013 07/23/2015 1051   PHURINE 7.0 07/23/2015 1051   GLUCOSEU NEGATIVE 07/23/2015 1051   HGBUR NEGATIVE 07/23/2015 1051   BILIRUBINUR NEGATIVE 07/23/2015 1051   KETONESUR NEGATIVE 07/23/2015 1051   PROTEINUR NEGATIVE 07/23/2015 1051   NITRITE NEGATIVE 07/23/2015 1051   LEUKOCYTESUR NEGATIVE 07/23/2015 1051     RADIOLOGY: Dg Chest 2 View  Result Date: 04/05/2016 CLINICAL DATA:  Cough.  Decreased breath sounds EXAM: CHEST  2 VIEW COMPARISON:  06/10/2015 FINDINGS: COPD with pulmonary hyperinflation. Negative for pneumonia. Cardiac enlargement without heart failure. Left peritracheal soft tissue mass with deviation of the trachea to the right. This could represent a goiter versus adenopathy. This is unchanged from the prior study. This is also present on 09/19/2014 and appear stable. IMPRESSION: COPD Cardiac enlargement without heart failure. Soft tissue mass displacing the trachea to the right unchanged from prior studies, probable goiter. Electronically Signed   By: Franchot Gallo M.D.   On: 04/05/2016 15:11    EKG: Orders placed or performed during the hospital encounter of 04/05/16  . ED EKG  . ED EKG    IMPRESSION AND PLAN: Patient is a 82 year old with cough and shortness of breath and wheezing  1. Acute bronchitis with acute bronchospasm We'll treat patient with IV Solu-Medrol and nebulizer Oral antibiotics  2. Elevated troponin due to demand ischemia follow cardiac enzymes  3. ckd 3 monitor renal  function, IVF due to deydration  4. Essential hypertention continue coreg  5. Misc:  lovenox for dvt proph  All the records are reviewed and case discussed with ED provider. Management plans discussed with the patient, family and they are in agreement.  CODE STATUS: Code Status History    Date Active Date Inactive Code Status Order ID Comments User Context   09/19/2014 11:14 PM 09/25/2014  5:57 PM Full Code [id number]  Lytle Butte, MD ED    Advance Directive Documentation   Flowsheet Row Most Recent Value  Type of Advance Directive  Healthcare Power of Attorney, Living will  Pre-existing out of facility DNR order (yellow form or pink MOST form)  No data  "MOST" Form in Place?  No data       TOTAL TIME TAKING CARE OF THIS PATIENT: 55 minutes.    Dustin Flock M.D on 04/05/2016 at 3:52 PM  Between 7am to 6pm - Pager - [phone]  After 6pm go to [email or web address] - password [email or web address] [facility]  Office  [phone]  CC: Primary care physician; Tracie Harrier, MD

## 2016-04-05 NOTE — ED Provider Notes (Signed)
[facility] Emergency Department Provider Note  ____________________________________________   First MD Initiated Contact with Patient 04/05/16 1355     (approximate)  I have reviewed the triage vital signs and the nursing notes.   HISTORY  Chief Complaint Weakness   HPI Sandy Roberson is a 82 y.o. female presenting to the emergency department weakness as well as a cough over the last 3 days. She has been taking azithromycin now and this is her second day of taking this antibiotic. Despite this, the patient has been very weak and been having aches in her legs. No fever at home. Cough which is not productive. Denies any chest pain.   Past Medical History:  Diagnosis Date  . Coronary artery disease   . Hypertension   . Renal disorder     Patient Active Problem List   Diagnosis Date Noted  . ITP (idiopathic thrombocytopenic purpura) 09/25/2014  . ARF (acute renal failure) ([place]) 09/25/2014  . Hypokalemia 09/25/2014  . Acute diastolic CHF (congestive heart failure) ([place]) 09/25/2014  . Sepsis due to urinary tract infection ([place]) 09/19/2014  . Hyponatremia 09/19/2014    Past Surgical History:  Procedure Laterality Date  . CORONARY STENT PLACEMENT    . FRACTURE SURGERY    . STENT PLACEMENT RT URETER ([place] HX)     since been removed     Prior to Admission medications   Medication Sig Start Date End Date Taking? Authorizing Provider  calcitRIOL (ROCALTROL) 0.25 MCG capsule Take 1 capsule by mouth daily. Daily at 1800 10/20/12   Historical Provider, MD  carvedilol (COREG) 3.125 MG tablet Take 1 tablet (3.125 mg total) by mouth 2 (two) times daily with a meal. Patient not taking: Reported on 07/23/2015 09/25/14   Gladstone Lighter, MD  carvedilol (COREG) 6.25 MG tablet Take 6.25 mg by mouth 2 (two) times daily with a meal.    Historical Provider, MD  cephALEXin (KEFLEX) 500 MG capsule Take 1 capsule (500 mg total) by mouth 2 (two) times daily. 09/25/14    Gladstone Lighter, MD  fosfomycin (MONUROL) 3 G PACK Take 3 g by mouth once. 12/21/14   Delman Kitten, MD  furosemide (LASIX) 20 MG tablet Take 1 tablet (20 mg total) by mouth daily. 09/25/14   Gladstone Lighter, MD  methimazole (TAPAZOLE) 5 MG tablet Take 1 tablet by mouth daily.    Historical Provider, MD  predniSONE (DELTASONE) 20 MG tablet Take 2 tablets (40 mg total) by mouth daily with breakfast. 09/25/14   Gladstone Lighter, MD  simvastatin (ZOCOR) 40 MG tablet Take 1 tablet by mouth at bedtime.    Historical Provider, MD    Allergies Penicillins  Family History  Problem Relation Age of Onset  . Heart failure Neg Hx     Social History Social History  Substance Use Topics  . Smoking status: Never Smoker  . Smokeless tobacco: Never Used  . Alcohol use No    Review of Systems Constitutional: No fever/chills Eyes: No visual changes. ENT: No sore throat. Cardiovascular: Denies chest pain. Respiratory: As above Gastrointestinal: No abdominal pain.  No nausea, no vomiting.  No diarrhea.  No constipation. Genitourinary: Negative for dysuria. Musculoskeletal: Negative for back pain. Skin: Negative for rash. Neurological: Negative for headaches, focal weakness or numbness.  10-point ROS otherwise negative.  ____________________________________________   PHYSICAL EXAM:  VITAL SIGNS: ED Triage Vitals    Enc Vitals Group     BP 125/71     Pulse Rate 95  Resp 14     Temp 98.7 F (37.1 C)     Temp Source Oral     SpO2 90 %     Weight 118 lb (53.5 kg)     Height 5\' 7"  (1.702 m)     Head Circumference      Peak Flow      Pain Score      Pain Loc      Pain Edu?      Excl. in [place]?     Constitutional: Alert and oriented. Well appearing and in no acute distress.  Patient with wet sounding cough but without any sputum production. Eyes: Conjunctivae are normal. PERRL. EOMI. Head: Atraumatic. Nose: No congestion/rhinnorhea. Mouth/Throat: Mucous membranes are moist.    Neck: No stridor.   Cardiovascular: Normal rate, regular rhythm. Grossly normal heart sounds.   Respiratory: Normal respiratory effort.  No retractions. Prolonged expiratory phase without wheezes. Gastrointestinal: Soft and nontender. No distention.  Musculoskeletal: No lower extremity tenderness nor edema.  No joint effusions. Neurologic:  Normal speech and language. No gross focal neurologic deficits are appreciated.  Skin:  Skin is warm, dry and intact. No rash noted. Psychiatric: Mood and affect are normal. Speech and behavior are normal.  ____________________________________________   LABS (all labs ordered are listed, but only abnormal results are displayed)  Labs Reviewed  BASIC METABOLIC PANEL - Abnormal; Notable for the following:       Result Value   Sodium 131 (*)    Chloride 98 (*)    Glucose, Bld 120 (*)    BUN 28 (*)    Creatinine, Ser 1.41 (*)    GFR calc non Af Amer 30 (*)    GFR calc Af Amer 35 (*)    All other components within normal limits  CBC - Abnormal; Notable for the following:    RBC 3.79 (*)    Platelets 130 (*)    All other components within normal limits  TROPONIN I - Abnormal; Notable for the following:    Troponin I 0.24 (*)    All other components within normal limits  URINALYSIS, COMPLETE (UACMP) WITH MICROSCOPIC   ____________________________________________  EKG  ED ECG REPORT I, Billijo Dilling,  Youlanda Roys, the attending physician, personally viewed and interpreted this ECG.   Date: 04/05/2016  EKG Time: 1351  Rate: 97  Rhythm: normal sinus rhythm  Axis: Normal axis  Intervals:none  ST&T Change: No ST segment elevation or depression. No abnormal T-wave inversion.  ____________________________________________  RADIOLOGY  DG Chest 2 View (Accession [id number]) (Order [id number])  Imaging  Date: 04/05/2016 Department: [facility] EMERGENCY DEPARTMENT Released By/Authorizing: Orbie Pyo, MD  (auto-released)  Exam Information   Status Exam Begun  Exam Ended   Final [99] 04/05/2016 2:50 PM 04/05/2016 3:03 PM  PACS Images   Show images for DG Chest 2 View  Study Result   CLINICAL DATA:  Cough.  Decreased breath sounds  EXAM: CHEST  2 VIEW  COMPARISON:  06/10/2015  FINDINGS: COPD with pulmonary hyperinflation. Negative for pneumonia. Cardiac enlargement without heart failure.  Left peritracheal soft tissue mass with deviation of the trachea to the right. This could represent a goiter versus adenopathy. This is unchanged from the prior study. This is also present on 09/19/2014 and appear stable.  IMPRESSION: COPD  Cardiac enlargement without heart failure.  Soft tissue mass displacing the trachea to the right unchanged from prior studies, probable goiter.   Electronically Signed   By: Juanda Crumble  Carlis Abbott M.D.   On: 04/05/2016 15:11     ____________________________________________   PROCEDURES  Procedure(s) performed:   Procedures  Critical Care performed:   ____________________________________________   INITIAL IMPRESSION / ASSESSMENT AND PLAN / ED COURSE  Pertinent labs & imaging results that were available during my care of the patient were reviewed by me and considered in my medical decision making (see chart for details).    Clinical Course   ----------------------------------------- 4:32 PM on 04/05/2016 -----------------------------------------  Patient resting without any distress at this time. Found to have an elevated troponin. Feel the troponin is more likely due to the patient's stress from illness rather than cardiac disease. Because of her age as well as lack of chest pain and will not be starting on heparin at this time. I recommend that troponin to be trended and the patient was signed out to Dr. Posey Pronto for admission to the hospital. I discussed the lab results. Plan with patient as well as family and they're  understanding willing to comply. Also with reassuring EKG without obvious ischemic changes.   ____________________________________________   FINAL CLINICAL IMPRESSION(S) / ED DIAGNOSES  Bronchitis.    NEW MEDICATIONS STARTED DURING THIS VISIT:  New Prescriptions   No medications on file     Note:  This document was prepared using Dragon voice recognition software and may include unintentional dictation errors.    Orbie Pyo, MD 04/05/16 [phone]

## 2016-04-05 NOTE — ED Notes (Signed)
Wille Glaser (son)  H: [phone] C: [phone]

## 2016-04-05 NOTE — ED Triage Notes (Signed)
Patient to ER for c/o generalized weakness. Patient has had URI for last couple of days (currently on Zpack), but has been unable to walk due to weakness other than with son behind her using walker. Uses walker on daily basis. Patient now unable to get out of chair d/t weakness. Denies any unilateral weakness.

## 2016-04-06 DIAGNOSIS — J101 Influenza due to other identified influenza virus with other respiratory manifestations: Secondary | ICD-10-CM | POA: Diagnosis not present

## 2016-04-06 LAB — BASIC METABOLIC PANEL
ANION GAP: 8 (ref 5–15)
BUN: 41 mg/dL — ABNORMAL HIGH (ref 6–20)
CHLORIDE: 100 mmol/L — AB (ref 101–111)
CO2: 26 mmol/L (ref 22–32)
Calcium: 8.7 mg/dL — ABNORMAL LOW (ref 8.9–10.3)
Creatinine, Ser: 1.6 mg/dL — ABNORMAL HIGH (ref 0.44–1.00)
GFR calc Af Amer: 30 mL/min — ABNORMAL LOW (ref >60)
GFR, EST NON AFRICAN AMERICAN: 26 mL/min — AB (ref >60)
GLUCOSE: 142 mg/dL — AB (ref 65–99)
POTASSIUM: 3.5 mmol/L (ref 3.5–5.1)
Sodium: 134 mmol/L — ABNORMAL LOW (ref 135–145)

## 2016-04-06 LAB — CBC
HEMATOCRIT: 35.1 % (ref 35.0–47.0)
HEMOGLOBIN: 12 g/dL (ref 12.0–16.0)
MCH: 32.3 pg (ref 26.0–34.0)
MCHC: 34.3 g/dL (ref 32.0–36.0)
MCV: 94.2 fL (ref 80.0–100.0)
Platelets: 130 10*3/uL — ABNORMAL LOW (ref 150–440)
RBC: 3.73 MIL/uL — AB (ref 3.80–5.20)
RDW: 14.8 % — ABNORMAL HIGH (ref 11.5–14.5)
WBC: 6.4 10*3/uL (ref 3.6–11.0)

## 2016-04-06 LAB — TROPONIN I
Troponin I: 0.1 ng/mL (ref <0.03)
Troponin I: 0.07 ng/mL (ref <0.03)

## 2016-04-06 MED ORDER — INFLUENZA VAC SPLIT QUAD 0.5 ML IM SUSY: 0.5000 mL | PREFILLED_SYRINGE | INTRAMUSCULAR | Status: DC

## 2016-04-06 MED ORDER — GUAIFENESIN ER 600 MG PO TB12: 600.0000 mg | ORAL_TABLET | Freq: Two times a day (BID) | ORAL | 0 refills | Status: DC | PRN

## 2016-04-06 MED ORDER — OSELTAMIVIR PHOSPHATE 30 MG PO CAPS
30.0000 mg | ORAL_CAPSULE | Freq: Every day | ORAL | 0 refills | Status: DC
Start: 2016-04-07 — End: 2018-03-28

## 2016-04-06 MED ORDER — OSELTAMIVIR PHOSPHATE 30 MG PO CAPS
30.0000 mg | ORAL_CAPSULE | Freq: Every day | ORAL | Status: DC
  Administered 2016-04-06: 30 mg via ORAL
  Filled 2016-04-06: qty 1

## 2016-04-06 NOTE — Discharge Summary (Signed)
[facility] [place] NAME: Sandy Roberson    MR#:  [id number]  DATE OF BIRTH:  [birth year]  DATE OF ADMISSION:  04/05/2016 ADMITTING PHYSICIAN: Dustin Flock, MD  DATE OF DISCHARGE: 04/06/16  PRIMARY CARE PHYSICIAN: Tracie Harrier, MD    ADMISSION DIAGNOSIS:  Bronchitis [J40] Elevated troponin [R74.8]  DISCHARGE DIAGNOSIS:  Bronchitis ruled out Influenza A Elevated troponin resolved  SECONDARY DIAGNOSIS:   Past Medical History:  Diagnosis Date  . Coronary artery disease   . Hypertension   . Renal disorder     HOSPITAL COURSE:  Sandy Roberson  is a 82 y.o. female admitted 04/05/2016 with chief complaint Weakness . Please see H&P performed by Dustin Flock, MD for further information. Patient presented with the above symptoms, tested positive for influenza A, started on tamiflu. Eval by PT recommended home health PT  DISCHARGE CONDITIONS:   stable  CONSULTS OBTAINED:    DRUG ALLERGIES:   Allergies  Allergen Reactions  . Penicillins Rash    DISCHARGE MEDICATIONS:   Current Discharge Medication List    START taking these medications   Details  guaiFENesin (MUCINEX) 600 MG 12 hr tablet Take 1 tablet (600 mg total) by mouth 2 (two) times daily as needed for cough or to loosen phlegm. Qty: 15 tablet, Refills: 0    oseltamivir (TAMIFLU) 30 MG capsule Take 1 capsule (30 mg total) by mouth daily. Qty: 4 capsule, Refills: 0      CONTINUE these medications which have NOT CHANGED   Details  calcitRIOL (ROCALTROL) 0.25 MCG capsule Take 1 capsule by mouth daily. Daily at 1800    carvedilol (COREG) 3.125 MG tablet Take 1 tablet (3.125 mg total) by mouth 2 (two) times daily with a meal. Qty: 60 tablet, Refills: 1    methimazole (TAPAZOLE) 5 MG tablet Take 1 tablet by mouth daily.    nitrofurantoin (MACRODANTIN) 100 MG capsule Take 100 mg by mouth daily.       STOP taking these medications     azithromycin (ZITHROMAX) 250 MG tablet      simvastatin (ZOCOR) 40 MG tablet          DISCHARGE INSTRUCTIONS:    DIET:  Regular diet  DISCHARGE CONDITION:  Stable  ACTIVITY:  Activity as tolerated  OXYGEN:  Home Oxygen: No.   Oxygen Delivery: room air  DISCHARGE LOCATION:  home   If you experience worsening of your admission symptoms, develop shortness of breath, life threatening emergency, suicidal or homicidal thoughts you must seek medical attention immediately by calling 911 or calling your MD immediately  if symptoms less severe.  You Must read complete instructions/literature along with all the possible adverse reactions/side effects for all the Medicines you take and that have been prescribed to you. Take any new Medicines after you have completely understood and accpet all the possible adverse reactions/side effects.   Please note  You were cared for by a hospitalist during your hospital stay. If you have any questions about your discharge medications or the care you received while you were in the hospital after you are discharged, you can call the unit and asked to speak with the hospitalist on call if the hospitalist that took care of you is not available. Once you are discharged, your primary care physician will handle any further medical issues. Please note that NO REFILLS for any discharge medications will be authorized once you are discharged, as it is imperative that you return to your  primary care physician (or establish a relationship with a primary care physician if you do not have one) for your aftercare needs so that they can reassess your need for medications and monitor your lab values.    On the day of Discharge:   VITAL SIGNS:  Blood pressure 135/64, pulse 78, temperature 97.8 F (36.6 C), temperature source Axillary, resp. rate 18, height 5\' 7"  (1.702 m), weight 53.5 kg (118 lb), SpO2 93 %.  I/O:  No intake or output data in the 24 hours ending  04/06/16 1203  PHYSICAL EXAMINATION:  GENERAL:  82 y.o.-year-old patient lying in the bed with no acute distress. Very sweet lady, hard of hearing EYES: Pupils equal, round, reactive to light and accommodation. No scleral icterus. Extraocular muscles intact.  HEENT: Head atraumatic, normocephalic. Oropharynx and nasopharynx clear.  NECK:  Supple, no jugular venous distention. No thyroid enlargement, no tenderness.  LUNGS: Normal breath sounds bilaterally, no wheezing, rales,rhonchi or crepitation. No use of accessory muscles of respiration.  CARDIOVASCULAR: S1, S2 normal. No murmurs, rubs, or gallops.  ABDOMEN: Soft, non-tender, non-distended. Bowel sounds present. No organomegaly or mass.  EXTREMITIES: No pedal edema, cyanosis, or clubbing.  NEUROLOGIC: Cranial nerves II through XII are intact. Muscle strength 5/5 in all extremities. Sensation intact. Gait not checked.  PSYCHIATRIC: The patient is alert and oriented x 3.  SKIN: No obvious rash, lesion, or ulcer.   DATA REVIEW:   CBC  Recent Labs Lab 04/06/16 0626  WBC 6.4  HGB 12.0  HCT 35.1  PLT 130*    Chemistries   Recent Labs Lab 04/06/16 0626  NA 134*  K 3.5  CL 100*  CO2 26  GLUCOSE 142*  BUN 41*  CREATININE 1.60*  CALCIUM 8.7*    Cardiac Enzymes  Recent Labs Lab 04/06/16 0626  TROPONINI 0.07*    Microbiology Results  Results for orders placed or performed during the hospital encounter of 06/10/15  Urine culture     Status: None   Collection Time: 06/10/15 11:15 AM  Result Value Ref Range Status   Specimen Description URINE, CLEAN CATCH  Final   Special Requests NONE  Final   Culture MULTIPLE SPECIES PRESENT, SUGGEST RECOLLECTION  Final   Report Status 06/12/2015 FINAL  Final    RADIOLOGY:  Dg Chest 2 View  Result Date: 04/05/2016 CLINICAL DATA:  Cough.  Decreased breath sounds EXAM: CHEST  2 VIEW COMPARISON:  06/10/2015 FINDINGS: COPD with pulmonary hyperinflation. Negative for pneumonia.  Cardiac enlargement without heart failure. Left peritracheal soft tissue mass with deviation of the trachea to the right. This could represent a goiter versus adenopathy. This is unchanged from the prior study. This is also present on 09/19/2014 and appear stable. IMPRESSION: COPD Cardiac enlargement without heart failure. Soft tissue mass displacing the trachea to the right unchanged from prior studies, probable goiter. Electronically Signed   By: Franchot Gallo M.D.   On: 04/05/2016 15:11     Management plans discussed with the patient, family and they are in agreement.  CODE STATUS:     Code Status Orders        Start     Ordered   04/05/16 1837  Full code  Continuous     04/05/16 1836    Code Status History    Date Active Date Inactive Code Status Order ID Comments User Context   09/19/2014 11:14 PM 09/25/2014  5:57 PM Full Code [id number]  Lytle Butte, MD ED    Advance Directive  Documentation   Flowsheet Row Most Recent Value  Type of Advance Directive  Healthcare Power of Attorney, Living will  Pre-existing out of facility DNR order (yellow form or pink MOST form)  No data  "MOST" Form in Place?  No data      TOTAL TIME TAKING CARE OF THIS PATIENT: 33 minutes.    Hower,  Karenann Cai.D on 04/06/2016 at 12:03 PM  Between 7am to 6pm - Pager - [phone]  After 6pm go to [email or web address] - Technical brewer [place] [facility]  Office  [phone]  CC: Primary care physician; Tracie Harrier, MD

## 2016-04-06 NOTE — Care Management Note (Signed)
Case Management Note  Patient Details  Name: Adlai Nieblas MRN: [id number] Date of Birth: 12/31/1915  Subjective/Objective:     Discussed Observation status on the phone with 82yo Mrs Tiley's son [name]. He verbalized understanding of Observation status. MOON letter left in Ms Polizzi's room for her family.                Action/Plan:   Expected Discharge Date:                  Expected Discharge Plan:     In-House Referral:     Discharge planning Services     Post Acute Care Choice:    Choice offered to:     DME Arranged:    DME Agency:     HH Arranged:    HH Agency:     Status of Service:     If discussed at [employer] of Stay Meetings, dates discussed:    Additional Comments:  Raylyn Carton A, RN 04/06/2016, 11:35 AM

## 2016-04-06 NOTE — Evaluation (Signed)
Physical Therapy Evaluation Patient Details Name: Sandy Roberson MRN: [id number] DOB: [birth year] Today's Date: 04/06/2016   History of Present Illness  Sandy Roberson is a 82yo white female who comes to [facility] after cold, cough, congestion; pt positive for flu upon admission. PMH: CAD, HTN, CKD, and history of falls in the past. Recent labs demonstrate Tr-I: 0.12, 0.10, 0.07, thought to be related to demand ischaemia.   Clinical Impression  Pt admitted with above diagnosis. Pt currently with functional limitations due to the deficits listed below (see "PT Problem List"). Upon entry, the patient is received semirecumbent in bed, no family/caregiver present. The pt is awake and agreeable to participate. No acute distress noted at this time. VSS during eval, transitioned to RA c SaO2>91% throughout, and HR <100bpm throughout with intermittent afib per tele. The pt is alert and oriented to self and situation, with some mild anomia during history taking, pleasant, conversational, and following simple and multi-step commands consistently. Strength/MMT screening reveals 5/5 in BUE and grip strength WNL. Functional mobility assessment demonstrates mild-moderate weakness, the pt now requiring moderate effort transfers, and reportedly more unsteady with gait. In spite of mild functional limitations, pt is near baseline as she performs household distances only at baseline, and self limits AMB s supervision due to falls anxiety. The patient is at high risk for falls as evidence by gait speed <1.33m/s, forward reach <5", and history of repeated falls.   Pt will benefit from skilled PT intervention to increase independence and safety with basic mobility in preparation for discharge to the venue listed below.       Follow Up Recommendations Home health PT;Supervision for mobility/OOB;Supervision - Intermittent    Equipment Recommendations  None recommended by PT    Recommendations for Other Services        Precautions / Restrictions Precautions Precautions: None      Mobility  Bed Mobility Overal bed mobility: Modified Independent                Transfers Overall transfer level: Needs assistance Equipment used: Rolling walker (2 wheeled) Transfers: Sit to/from Stand Sit to Stand: Min guard            Ambulation/Gait Ambulation/Gait assistance: Min guard Ambulation Distance (Feet): 30 Feet Assistive device: Rolling walker (2 wheeled) Gait Pattern/deviations: WFL(Within Functional Limits)   Gait velocity interpretation: <1.8 ft/sec, indicative of risk for recurrent falls General Gait Details: On room air, SaO2: WNL; pt reports she feels shakey and weak.   Stairs            Wheelchair Mobility    Modified Rankin (Stroke Patients Only)       Balance Overall balance assessment: Modified Independent;No apparent balance deficits (not formally assessed);History of Falls                                           Pertinent Vitals/Pain Pain Assessment: No/denies pain    Home Living Family/patient expects to be discharged to:: Private residence Living Arrangements: Children (Live with son) Available Help at Discharge: Family Type of Home: House Home Access: Stairs to enter   [employer] of Steps: 3, son helps her with stairs Home Layout: One level Home Equipment: Environmental consultant - 2 wheels;Cane - single point      Prior Function Level of Independence: Independent with assistive device(s)         Comments: mod I  with equipment for ADL, but reports that showering is very difficult for her, fearful of falling.      Hand Dominance        Extremity/Trunk Assessment   Upper Extremity Assessment Upper Extremity Assessment: Overall WFL for tasks assessed    Lower Extremity Assessment Lower Extremity Assessment: Generalized weakness;Overall [facility] for tasks assessed    Cervical / Trunk Assessment Cervical / Trunk  Assessment:  (Excellent standing posture.)  Communication   Communication: HOH  Cognition Arousal/Alertness: Awake/alert Behavior During Therapy: WFL for tasks assessed/performed Overall Cognitive Status: Within Functional Limits for tasks assessed                      General Comments      Exercises     Assessment/Plan    PT Assessment Patient needs continued PT services  PT Problem List Decreased strength;Decreased activity tolerance;Decreased balance;Decreased mobility          PT Treatment Interventions Stair training;Functional mobility training;Therapeutic activities;Therapeutic exercise;Balance training;Patient/family education    PT Goals (Current goals can be found in the Care Plan section)  Acute Rehab PT Goals Patient Stated Goal: Return to home, regain strength PT Goal Formulation: With patient Time For Goal Achievement: 04/20/16 Potential to Achieve Goals: Good    Frequency Min 2X/week   Barriers to discharge        Co-evaluation               End of Session Equipment Utilized During Treatment: Gait belt;Oxygen Activity Tolerance: Patient tolerated treatment well;Patient limited by fatigue;No increased pain Patient left: in chair;with nursing/sitter in room Nurse Communication: Other (comment)    Functional Assessment Tool Used: Clinical Judgment  Functional Limitation: Mobility: Walking and moving around Mobility: Walking and Moving Around Current Status (C1448): At least 60 percent but less than 80 percent impaired, limited or restricted Mobility: Walking and Moving Around Goal Status [phone]): At least 60 percent but less than 80 percent impaired, limited or restricted    Time: 0916-0946 PT Time Calculation (min) (ACUTE ONLY): 30 min   Charges:   PT Evaluation $PT Eval Moderate Complexity: 1 Procedure PT Treatments $Therapeutic Activity: 8-22 mins   PT G Codes:   PT G-Codes **NOT FOR INPATIENT CLASS** Functional Assessment  Tool Used: Clinical Judgment  Functional Limitation: Mobility: Walking and moving around Mobility: Walking and Moving Around Current Status (J4970): At least 60 percent but less than 80 percent impaired, limited or restricted Mobility: Walking and Moving Around Goal Status [phone]): At least 60 percent but less than 80 percent impaired, limited or restricted    10:03 AM, 04/06/16 Etta Grandchild, PT, DPT Physical Therapist - [place] [phone] (mobile)

## 2016-04-06 NOTE — Progress Notes (Signed)
Influenza A: contact and droplet precaution  Droplet precautions should be implemented for patients with suspected or confirmed influenza for 7 days after illness onset or until 24 hours after the resolution of fever and respiratory symptoms, whichever is longer, while a patient is in a healthcare facility.

## 2016-04-06 NOTE — Care Management Obs Status (Signed)
MEDICARE OBSERVATION STATUS NOTIFICATION   Patient Details  Name: Sandy Roberson MRN: [id number] Date of Birth: 25-Oct-1915   Medicare Observation Status Notification Given:  Yes  Discussed with son [name] on the phone. He verbalized understanding of Observation status.     Erienne Spelman A, RN 04/06/2016, 11:39 AM

## 2016-04-06 NOTE — Discharge Instructions (Signed)
Fatigue Introduction Fatigue is feeling tired all of the time, a lack of energy, or a lack of motivation. Occasional or mild fatigue is often a normal response to activity or life in general. However, long-lasting (chronic) or extreme fatigue may indicate an underlying medical condition. Follow these instructions at home: Watch your fatigue for any changes. The following actions may help to lessen any discomfort you are feeling:  Talk to your health care provider about how much sleep you need each night. Try to get the required amount every night.  Take medicines only as directed by your health care provider.  Eat a healthy and nutritious diet. Ask your health care provider if you need help changing your diet.  Drink enough fluid to keep your urine clear or pale yellow.  Practice ways of relaxing, such as yoga, meditation, massage therapy, or acupuncture.  Exercise regularly.  Change situations that cause you stress. Try to keep your work and personal routine reasonable.  Do not abuse illegal drugs.  Limit alcohol intake to no more than 1 drink per day for nonpregnant women and 2 drinks per day for men. One drink equals 12 ounces of beer, 5 ounces of wine, or 1 ounces of hard liquor.  Take a multivitamin, if directed by your health care provider. Contact a health care provider if:  Your fatigue does not get better.  You have a fever.  You have unintentional weight loss or gain.  You have headaches.  You have difficulty:  Falling asleep.  Sleeping throughout the night.  You feel angry, guilty, anxious, or sad.  You are unable to have a bowel movement (constipation).  You skin is dry.  Your legs or another part of your body is swollen. Get help right away if:  You feel confused.  Your vision is blurry.  You feel faint or pass out.  You have a severe headache.  You have severe abdominal, pelvic, or back pain.  You have chest pain, shortness of breath, or an  irregular or fast heartbeat.  You are unable to urinate or you urinate less than normal.  You develop abnormal bleeding, such as bleeding from the rectum, vagina, nose, lungs, or nipples.  You vomit blood.  You have thoughts about harming yourself or committing suicide.  You are worried that you might harm someone else. This information is not intended to replace advice given to you by your health care provider. Make sure you discuss any questions you have with your health care provider. Document Released: 01/28/2007 Document Revised: 09/08/2015 Document Reviewed: 08/04/2013  2017 Elsevier  

## 2016-04-06 NOTE — Care Management Note (Signed)
Case Management Note  Patient Details  Name: Montana Bryngelson MRN: [id number] Date of Birth: March 17, 1916  Subjective/Objective:      Discussed HH-PT with son [name]. [name] and Mrs Morash have verbalized concerns about discharge to home per Mrs Dauphinee's generalized weakness. . Dr Lavetta Nielsen will be notified by Mrs Zeck's nurse of their concerns about discharge home today. Son [name] chose [facility] to be Mrs Giovanni's home health PT provider. Referral given to [facility] face to face.               Action/Plan:   Expected Discharge Date:                  Expected Discharge Plan:     In-House Referral:     Discharge planning Services     Post Acute Care Choice:    Choice offered to:     DME Arranged:    DME Agency:     HH Arranged:    HH Agency:     Status of Service:     If discussed at [employer] of Stay Meetings, dates discussed:    Additional Comments:  Maysoon Lozada A, RN 04/06/2016, 1:01 PM

## 2018-03-11 ENCOUNTER — Ambulatory Visit (INDEPENDENT_AMBULATORY_CARE_PROVIDER_SITE_OTHER): Payer: Medicare Other | Admitting: Podiatry

## 2018-03-11 VITALS — BP 170/99 | HR 78

## 2018-03-11 DIAGNOSIS — S81801A Unspecified open wound, right lower leg, initial encounter: Secondary | ICD-10-CM | POA: Diagnosis not present

## 2018-03-11 DIAGNOSIS — N183 Chronic kidney disease, stage 3 unspecified: Secondary | ICD-10-CM | POA: Insufficient documentation

## 2018-03-11 DIAGNOSIS — I739 Peripheral vascular disease, unspecified: Secondary | ICD-10-CM | POA: Diagnosis not present

## 2018-03-11 DIAGNOSIS — M79676 Pain in unspecified toe(s): Secondary | ICD-10-CM

## 2018-03-11 DIAGNOSIS — M2011 Hallux valgus (acquired), right foot: Secondary | ICD-10-CM

## 2018-03-11 DIAGNOSIS — M2012 Hallux valgus (acquired), left foot: Secondary | ICD-10-CM | POA: Diagnosis not present

## 2018-03-11 DIAGNOSIS — E785 Hyperlipidemia, unspecified: Secondary | ICD-10-CM | POA: Insufficient documentation

## 2018-03-11 DIAGNOSIS — B351 Tinea unguium: Secondary | ICD-10-CM | POA: Diagnosis not present

## 2018-03-11 DIAGNOSIS — I2510 Atherosclerotic heart disease of native coronary artery without angina pectoris: Secondary | ICD-10-CM | POA: Insufficient documentation

## 2018-03-11 DIAGNOSIS — E079 Disorder of thyroid, unspecified: Secondary | ICD-10-CM | POA: Insufficient documentation

## 2018-03-11 DIAGNOSIS — N189 Chronic kidney disease, unspecified: Secondary | ICD-10-CM | POA: Insufficient documentation

## 2018-03-11 DIAGNOSIS — I10 Essential (primary) hypertension: Secondary | ICD-10-CM | POA: Insufficient documentation

## 2018-03-11 MED ORDER — CEPHALEXIN 500 MG PO CAPS: 500.0000 mg | ORAL_CAPSULE | Freq: Three times a day (TID) | ORAL | 1 refills | Status: DC

## 2018-03-11 MED ORDER — MUPIROCIN 2 % EX OINT: TOPICAL_OINTMENT | CUTANEOUS | 1 refills | Status: DC

## 2018-03-12 ENCOUNTER — Telehealth: Payer: Self-pay | Admitting: *Deleted

## 2018-03-12 DIAGNOSIS — S81801A Unspecified open wound, right lower leg, initial encounter: Secondary | ICD-10-CM

## 2018-03-12 DIAGNOSIS — I739 Peripheral vascular disease, unspecified: Secondary | ICD-10-CM

## 2018-03-12 NOTE — Telephone Encounter (Signed)
-----   Message from Rip Harbour, [facility] sent at 03/11/2018  2:26 PM EST ----- Regarding: Vascular Vascular studies-  ABI's for non healing wound right foot   Prefers [employer]

## 2018-03-12 NOTE — Telephone Encounter (Signed)
Faxed orders to [facility].

## 2018-03-12 NOTE — Progress Notes (Signed)
Subjective:  Patient ID: Sandy Roberson, female    DOB: [birth year],  MRN: [id number] HPI Chief Complaint  Patient presents with  . Bunions    Right medial HAV bunion. Pt states duration of infection/wound 2wks. Pt states pain even at rest. Right forefoot dorsal/plantar red and inflamed.   . Nail Problem    Pt has thick yellow nails and in particular states an issue with her left 3rd nail which is causing her some pain.    84 y.o. female presents with the above complaint.   Denies fever chills nausea muscle aches pains calf pain back pain chest pain shortness of breath.  Past Medical History:  Diagnosis Date  . Coronary artery disease   . Hypertension   . Renal disorder    Past Surgical History:  Procedure Laterality Date  . CORONARY STENT PLACEMENT    . FRACTURE SURGERY    . STENT PLACEMENT RT URETER [facility] HX)     since been removed     Current Outpatient Medications:  .  ASPIRIN 81 PO, Frequency:TAKE 1 TABLET DAILY.   Dosage:0.0     Instructions:  Note:, Disp: , Rfl:  .  B Complex-C (RA B-COMPLEX/VITAMIN C CR PO), Take by mouth., Disp: , Rfl:  .  calcitRIOL (ROCALTROL) 0.25 MCG capsule, Take 1 capsule by mouth daily. Daily at 1800, Disp: , Rfl:  .  Calcium Carb-Cholecalciferol 600-800 MG-UNIT TABS, Take by mouth., Disp: , Rfl:  .  carvedilol (COREG) 3.125 MG tablet, Take 1 tablet (3.125 mg total) by mouth 2 (two) times daily with a meal., Disp: 60 tablet, Rfl: 1 .  estradiol (ESTRACE) 0.1 MG/GM vaginal cream, Apply a small amount to urethra daily as directed, Disp: , Rfl:  .  methimazole (TAPAZOLE) 5 MG tablet, Take 1 tablet by mouth daily., Disp: , Rfl:  .  Multiple Vitamins-Minerals (CENTRUM SILVER PO), Frequency:   Dosage:0.0     Instructions:  Note:, Disp: , Rfl:  .  nitrofurantoin (MACRODANTIN) 100 MG capsule, Take 100 mg by mouth daily. , Disp: , Rfl:  .  simvastatin (ZOCOR) 40 MG tablet, Take by mouth., Disp: , Rfl:  .  cephALEXin (KEFLEX) 500 MG capsule, Take 1  capsule (500 mg total) by mouth 3 (three) times daily., Disp: 30 capsule, Rfl: 1 .  guaiFENesin (MUCINEX) 600 MG 12 hr tablet, Take 1 tablet (600 mg total) by mouth 2 (two) times daily as needed for cough or to loosen phlegm. (Patient not taking: Reported on 03/11/2018), Disp: 15 tablet, Rfl: 0 .  mupirocin ointment (BACTROBAN) 2 %, Apply to wound after soaking BID, Disp: 30 g, Rfl: 1 .  oseltamivir (TAMIFLU) 30 MG capsule, Take 1 capsule (30 mg total) by mouth daily. (Patient not taking: Reported on 03/11/2018), Disp: 4 capsule, Rfl: 0  Allergies  Allergen Reactions  . Penicillins Rash   Review of Systems Objective:   Vitals:   03/11/18 1356  BP: (!) 170/99  Pulse: 78    General: Well developed, nourished, in no acute distress, alert and oriented x3   Dermatological: Skin is warm, dry and supple bilateral. Nails x 10 are well maintained; remaining integument appears unremarkable at this time. There are no open sores, no preulcerative lesions, no rash or signs of infection present.  Ulceration medial aspect of the first metatarsophalangeal joint right foot 3 mm in diameter no purulence no malodor no granulation tissue.  Toenails are long thick yellow dystrophic-like mycotic painful palpation as well as debridement.  Vascular: Dorsalis Pedis  artery and Posterior Tibial artery pedal pulses are 0 out of 4 bilateral with immedate capillary fill time. Pedal hair growth present. No varicosities and no lower extremity edema present bilateral.   Neruologic: Grossly intact via light touch bilateral. Vibratory intact via tuning fork bilateral. Protective threshold with Semmes Wienstein monofilament intact to all pedal sites bilateral. Patellar and Achilles deep tendon reflexes 2+ bilateral. No Babinski or clonus noted bilateral.   Musculoskeletal: No gross boney pedal deformities bilateral. No pain, crepitus, or limitation noted with foot and ankle range of motion bilateral. Muscular strength 5/5 in  all groups tested bilateral.  Hallux abductovalgus deformity with hammertoe deformities bilateral.  Pes planus is noted.  Gait: Unassisted, Nonantalgic.    Radiographs:  Radiographs were reviewed demonstrating no significant signs of infection.  Assessment & Plan:   Assessment: Hallux valgus with peripheral vascular disease and chronic ulceration right foot.  Pain in limb secondary to onychomycosis.  Plan: Debrided the wound today debrided nails as well.  Wrote a prescription for Bactroban ointment to be applied to the wound once daily after cleansing and a dry sterile compressive dressing added.  I also want her to be in a Darco shoe to help keep the pressure off of this.  Follow-up with [place] in 2 weeks     Ellory Khurana T. Lesage, [place]

## 2018-03-18 ENCOUNTER — Ambulatory Visit (INDEPENDENT_AMBULATORY_CARE_PROVIDER_SITE_OTHER): Payer: Medicare Other

## 2018-03-18 DIAGNOSIS — S81801A Unspecified open wound, right lower leg, initial encounter: Secondary | ICD-10-CM | POA: Diagnosis not present

## 2018-03-18 DIAGNOSIS — I739 Peripheral vascular disease, unspecified: Secondary | ICD-10-CM

## 2018-03-24 ENCOUNTER — Telehealth (INDEPENDENT_AMBULATORY_CARE_PROVIDER_SITE_OTHER): Payer: Self-pay

## 2018-03-24 ENCOUNTER — Telehealth: Payer: Self-pay | Admitting: Podiatry

## 2018-03-24 NOTE — Telephone Encounter (Signed)
Patient's son wants someone to call him with results of his mother's U/S last week.

## 2018-03-24 NOTE — Telephone Encounter (Signed)
Pt was seen and referred to have a circulation test for feet. Pt called and is unable to get the results from the practice without having an appt. Pt states they are unable to schedule an appt and wanted to know if Dr. Milinda Pointer could request the results of the test.  Pt has appt on 12/18 in [place].  Please give pt a call.

## 2018-03-24 NOTE — Telephone Encounter (Signed)
Seems as if the patient only had an ABI done with no appointment with a provider referred by her podiatrist Dr. Milinda Pointer to rule out PAD. She also has an ulcer. I think the best option is to have the patient and a family member come in to the office to discuss the results (she had disease to the bilateral legs) and will need an angiogram.

## 2018-03-24 NOTE — Telephone Encounter (Signed)
The patient's son was called and I explained that the patient will need to be seen by a provider in our office. The call was transferred to [place] at checkout.

## 2018-03-25 ENCOUNTER — Telehealth: Payer: Self-pay

## 2018-03-25 ENCOUNTER — Telehealth: Payer: Self-pay | Admitting: *Deleted

## 2018-03-25 DIAGNOSIS — S81801A Unspecified open wound, right lower leg, initial encounter: Secondary | ICD-10-CM

## 2018-03-25 DIAGNOSIS — I739 Peripheral vascular disease, unspecified: Secondary | ICD-10-CM

## 2018-03-25 NOTE — Telephone Encounter (Signed)
Left message informing pt Dr. Milinda Pointer had reviewed her results and referred to [facility] where she was tested.

## 2018-03-25 NOTE — Telephone Encounter (Signed)
I spoke with daughter in law, patient is aware of appt with Dr. Lucky Cowboy at [place] on Friday 03/28/18

## 2018-03-25 NOTE — Telephone Encounter (Signed)
-----   Message from Garrel Ridgel, [place] sent at 03/24/2018  6:51 AM EST ----- She needs a consult with the vascular doctor.  Dont forget a consult should always be ordered at the same time you order the test stating " consult with abnormal labs".

## 2018-03-25 NOTE — Telephone Encounter (Signed)
Patient has been scheduled with Dr. Lucky Cowboy for 03/28/18

## 2018-03-28 ENCOUNTER — Ambulatory Visit (INDEPENDENT_AMBULATORY_CARE_PROVIDER_SITE_OTHER): Payer: Medicare Other | Admitting: Vascular Surgery

## 2018-03-28 ENCOUNTER — Encounter (INDEPENDENT_AMBULATORY_CARE_PROVIDER_SITE_OTHER): Payer: Self-pay | Admitting: Vascular Surgery

## 2018-03-28 VITALS — BP 180/81 | HR 73 | Resp 20 | Ht 61.0 in | Wt 117.8 lb

## 2018-03-28 DIAGNOSIS — I7025 Atherosclerosis of native arteries of other extremities with ulceration: Secondary | ICD-10-CM

## 2018-03-28 DIAGNOSIS — N183 Chronic kidney disease, stage 3 unspecified: Secondary | ICD-10-CM

## 2018-03-28 DIAGNOSIS — I2510 Atherosclerotic heart disease of native coronary artery without angina pectoris: Secondary | ICD-10-CM

## 2018-03-28 DIAGNOSIS — I129 Hypertensive chronic kidney disease with stage 1 through stage 4 chronic kidney disease, or unspecified chronic kidney disease: Secondary | ICD-10-CM | POA: Diagnosis not present

## 2018-03-28 DIAGNOSIS — I10 Essential (primary) hypertension: Secondary | ICD-10-CM

## 2018-03-28 DIAGNOSIS — E785 Hyperlipidemia, unspecified: Secondary | ICD-10-CM | POA: Diagnosis not present

## 2018-03-28 NOTE — Patient Instructions (Signed)

## 2018-03-28 NOTE — Assessment & Plan Note (Signed)
lipid control important in reducing the progression of atherosclerotic disease. Continue statin therapy  

## 2018-03-28 NOTE — Assessment & Plan Note (Signed)
blood pressure control important in reducing the progression of atherosclerotic disease. On appropriate oral medications.  

## 2018-03-28 NOTE — Assessment & Plan Note (Signed)
This could be an issue with angiogram.  We will try to limit contrast if an angiogram is performed and hydrate her as needed.

## 2018-03-28 NOTE — Assessment & Plan Note (Signed)
She has previously had noninvasive studies performed which demonstrated monophasic waveforms bilaterally with a right ABI of 0.73 and a left ABI of 0.88 although these levels may be falsely elevated from medial calcification.  Her digital pressures however are markedly diminished at only 30 on the right and 27 on the left. This is a critical and limb threatening situation.  Given her advanced age, even a minor intervention certainly carry some risk.  They would like to follow-up with her podiatrist next week to assess for degree of wound healing at this point.  I think that is very reasonable.  Exercise and increased activity will promote circulation.  I think she will ultimately need an angiogram and if her wound is not improved at her next visit with her podiatrist, they are going to call back and schedule an angiogram of the right lower extremity.  I have discussed the procedure including the risks and benefits of the procedure.  If her wound continues to improve, we will keep her on a short interval follow-up of 1 to 2 months with noninvasive studies.  She and her son voiced their understanding and are agreeable with our plan of care.

## 2018-03-28 NOTE — Assessment & Plan Note (Signed)
Has had previous coronary intervention.  Angiogram would be low risk from a cardiac standpoint.

## 2018-03-28 NOTE — Progress Notes (Signed)
Patient ID: Sandy Roberson, female   DOB: 1915/10/05, 84 y.o.   MRN: [id number]  Chief Complaint  Patient presents with  . New Patient (Initial Visit)    HPI Sandy Roberson is a 84 y.o. female.  I am asked to see the patient by Dr. Milinda Pointer for evaluation of PAD and non-healing ulceration of the right foot.  The patient reports a month or 6 weeks of an ulceration on the right foot which has not been healing.  It is only mildly painful.  No fevers or chills.  This was likely a pressure spot, but there was no clear trauma or injury that started the ulceration.  She has had a previous coronary stent but no lower extremity interventions or bypasses to her knowledge.  No current ulcerations on the left leg.  She does not describe ischemic rest pain.  She does not have lifestyle limiting claudication.  She has previously had noninvasive studies performed which demonstrated monophasic waveforms bilaterally with a right ABI of 0.73 and a left ABI of 0.88 although these levels may be falsely elevated from medial calcification.  Her digital pressures however are markedly diminished at only 30 on the right and 27 on the left.   Past Medical History:  Diagnosis Date  . Coronary artery disease   . Hypertension   . Renal disorder     Past Surgical History:  Procedure Laterality Date  . CORONARY STENT PLACEMENT    . FRACTURE SURGERY    . STENT PLACEMENT RT URETER ([facility] HX)     since been removed     Family History  Problem Relation Age of Onset  . Heart failure Neg Hx   Notable for longevity.  She has 2 siblings still living well into their 61s. No bleeding disorders or clotting disorders.  No aneurysms.  Social History Social History   Tobacco Use  . Smoking status: Never Smoker  . Smokeless tobacco: Never Used  Substance Use Topics  . Alcohol use: No  . Drug use: Not on file  No drug use  Allergies  Allergen Reactions  . Penicillins Rash    Current Outpatient  Medications  Medication Sig Dispense Refill  . ASPIRIN 81 PO Frequency:TAKE 1 TABLET DAILY.   Dosage:0.0     Instructions:  Note:    . calcitRIOL (ROCALTROL) 0.25 MCG capsule Take 1 capsule by mouth daily. Daily at 1800    . carvedilol (COREG) 3.125 MG tablet Take 1 tablet (3.125 mg total) by mouth 2 (two) times daily with a meal. 60 tablet 1  . methimazole (TAPAZOLE) 5 MG tablet Take 1 tablet by mouth daily.    . nitrofurantoin (MACRODANTIN) 100 MG capsule Take 100 mg by mouth daily.     No current facility-administered medications for this visit.       REVIEW OF SYSTEMS (Negative unless checked)  Constitutional: [] Weight loss  [] Fever  [] Chills Cardiac: [] Chest pain   [] Chest pressure   [] Palpitations   [] Shortness of breath when laying flat   [] Shortness of breath at rest   [] Shortness of breath with exertion. Vascular:  [] Pain in legs with walking   [] Pain in legs at rest   [] Pain in legs when laying flat   [] Claudication   [] Pain in feet when walking  [] Pain in feet at rest  [] Pain in feet when laying flat   [] History of DVT   [] Phlebitis   [] Swelling in legs   [] Varicose veins   [x] Non-healing ulcers Pulmonary:   []   Uses home oxygen   [] Productive cough   [] Hemoptysis   [] Wheeze  [] COPD   [] Asthma Neurologic:  [] Dizziness  [] Blackouts   [] Seizures   [] History of stroke   [] History of TIA  [] Aphasia   [] Temporary blindness   [] Dysphagia   [] Weakness or numbness in arms   [] Weakness or numbness in legs Musculoskeletal:  [x] Arthritis   [] Joint swelling   [] Joint pain   [] Low back pain Hematologic:  [] Easy bruising  [] Easy bleeding   [] Hypercoagulable state   [] Anemic  [] Hepatitis Gastrointestinal:  [] Blood in stool   [] Vomiting blood  [] Gastroesophageal reflux/heartburn   [] Abdominal pain Genitourinary:  [x] Chronic kidney disease   [] Difficult urination  [] Frequent urination  [] Burning with urination   [] Hematuria Skin:  [] Rashes   [x] Ulcers   [x] Wounds Psychological:  [] History of anxiety    []  History of major depression.    Physical Exam BP (!) 180/81 (BP Location: Right Arm, Patient Position: Sitting)   Pulse 73   Resp 20   Ht 5\' 1"  (1.549 m)   Wt 117 lb 12.8 oz (53.4 kg)   BMI 22.26 kg/m  Gen:  WD/WN, NAD.  Appears much younger than stated age Head: [place]/AT, No temporalis wasting.  Ear/Nose/Throat: Hearing diminished, nares w/o erythema or drainage, oropharynx w/o Erythema/Exudate Eyes: Conjunctiva clear, sclera non-icteric  Neck: trachea midline.  Pulmonary:  Good air movement, respirations are not labored, no use of accessory muscles Cardiac: RRR, no JVD Vascular:  Vessel Right Left  Radial Palpable Palpable                          PT  not palpable  not palpable  DP  not palpable  not palpable   Gastrointestinal: soft, non-tender/non-distended.  Musculoskeletal: M/S 5/5 throughout.  Feet are relatively warm.  There is some rubor with dependency of both forefeet.  Dry ulceration on the right medial first metatarsal head area which is dressed today.  No drainage or erythema Neurologic: Sensation grossly intact in extremities.  Symmetrical.  Speech is fluent. Motor exam as listed above. Psychiatric: Judgment intact, Mood & affect appropriate for pt's clinical situation. Dermatologic: Right foot as above    Radiology Vas [place] Abi With/wo Tbi  Result Date: 03/21/2018 LOWER EXTREMITY DOPPLER STUDY Indications: Peripheral artery disease.  Performing Technologist: Charlane Ferretti RT (R)([facility])  Examination Guidelines: A complete evaluation includes at minimum, Doppler waveform signals and systolic blood pressure reading at the level of bilateral brachial, anterior tibial, and posterior tibial arteries, when vessel segments are accessible. Bilateral testing is considered an integral part of a complete examination. Photoelectric Plethysmograph (PPG) waveforms and toe systolic pressure readings are included as required and additional duplex testing as needed. Limited  examinations for reoccurring indications may be performed as noted.  ABI Findings: +---------+------------------+-----+----------+--------+ Right    Rt Pressure (mmHg)IndexWaveform  Comment  +---------+------------------+-----+----------+--------+ Brachial 162                                       +---------+------------------+-----+----------+--------+ ATA      86                0.53 monophasic         +---------+------------------+-----+----------+--------+ PTA      118               0.73 monophasic         +---------+------------------+-----+----------+--------+  Great Toe30                0.19 Abnormal           +---------+------------------+-----+----------+--------+ +---------+------------------+-----+----------+-------+ Left     Lt Pressure (mmHg)IndexWaveform  Comment +---------+------------------+-----+----------+-------+ Brachial 154                                      +---------+------------------+-----+----------+-------+ ATA      120               0.74 monophasic        +---------+------------------+-----+----------+-------+ PTA      143               0.88 monophasic        +---------+------------------+-----+----------+-------+ Great Toe27                0.17 Abnormal          +---------+------------------+-----+----------+-------+ +-------+-----------+-----------+------------+------------+ ABI/TBIToday's ABIToday's TBIPrevious ABIPrevious TBI +-------+-----------+-----------+------------+------------+ Right  .73        .19                                 +-------+-----------+-----------+------------+------------+ Left   .88        .17                                 +-------+-----------+-----------+------------+------------+ Summary: Right: Resting right ankle-brachial index indicates moderate right lower extremity arterial disease. The right toe-brachial index is abnormal. Left: Resting left ankle-brachial index indicates  mild left lower extremity arterial disease. The left toe-brachial index is abnormal.  *See table(s) above for measurements and observations.  Electronically signed by Leotis Pain MD on 03/21/2018 at 12:04:16 PM.   Final     Labs No results found for this or any previous visit (from the past 2160 hour(s)).  Assessment/Plan:  CKD (chronic kidney disease) stage 3, GFR 30-59 ml/min (HCC) This could be an issue with angiogram.  We will try to limit contrast if an angiogram is performed and hydrate her as needed.  Hypertension blood pressure control important in reducing the progression of atherosclerotic disease. On appropriate oral medications.   Hyperlipidemia lipid control important in reducing the progression of atherosclerotic disease. Continue statin therapy   Coronary artery disease Has had previous coronary intervention.  Angiogram would be low risk from a cardiac standpoint.  Atherosclerosis of native arteries of the extremities with ulceration ([place]) She has previously had noninvasive studies performed which demonstrated monophasic waveforms bilaterally with a right ABI of 0.73 and a left ABI of 0.88 although these levels may be falsely elevated from medial calcification.  Her digital pressures however are markedly diminished at only 30 on the right and 27 on the left. This is a critical and limb threatening situation.  Given her advanced age, even a minor intervention certainly carry some risk.  They would like to follow-up with her podiatrist next week to assess for degree of wound healing at this point.  I think that is very reasonable.  Exercise and increased activity will promote circulation.  I think she will ultimately need an angiogram and if her wound is not improved at her next visit with her podiatrist, they are going to call back and schedule an angiogram of the right lower extremity.  I  have discussed the procedure including the risks and benefits of the procedure.  If her wound  continues to improve, we will keep her on a short interval follow-up of 1 to 2 months with noninvasive studies.  She and her son voiced their understanding and are agreeable with our plan of care.      Leotis Pain 03/28/2018, 11:47 AM   This note was created with Dragon medical transcription system.  Any errors from dictation are unintentional.

## 2018-04-02 ENCOUNTER — Ambulatory Visit (INDEPENDENT_AMBULATORY_CARE_PROVIDER_SITE_OTHER): Payer: Medicare Other | Admitting: Podiatry

## 2018-04-02 ENCOUNTER — Encounter: Payer: Self-pay | Admitting: Podiatry

## 2018-04-02 DIAGNOSIS — I7025 Atherosclerosis of native arteries of other extremities with ulceration: Secondary | ICD-10-CM | POA: Diagnosis not present

## 2018-04-02 DIAGNOSIS — S81801A Unspecified open wound, right lower leg, initial encounter: Secondary | ICD-10-CM

## 2018-04-02 NOTE — Progress Notes (Signed)
She presents today for follow-up of her ulceration medial aspect of the right foot.  [facility] family states that they have been taking care of it regularly and increasing her range of motion exercises and strengthening exercises.  States that vascular says that she is on the borderline of needing surgery.  Objective: Currently the ulceration medial aspect of the first metatarsophalangeal joint with hallux valgus deformity has healed it is no longer open it is not purulent there is no edema or pain in the area.  No cellulitis.  Assessment: Well-healing ulceration peripheral vascular disease of the right foot.  Plan: Continue current therapies follow-up with me in 3 to 4 weeks.

## 2018-05-05 ENCOUNTER — Ambulatory Visit: Payer: Medicare Other | Admitting: Podiatry

## 2018-05-07 ENCOUNTER — Encounter: Payer: Self-pay | Admitting: Podiatry

## 2018-05-07 ENCOUNTER — Ambulatory Visit (INDEPENDENT_AMBULATORY_CARE_PROVIDER_SITE_OTHER): Payer: Medicare Other | Admitting: Podiatry

## 2018-05-07 DIAGNOSIS — S81801D Unspecified open wound, right lower leg, subsequent encounter: Secondary | ICD-10-CM

## 2018-05-07 DIAGNOSIS — I739 Peripheral vascular disease, unspecified: Secondary | ICD-10-CM

## 2018-05-07 NOTE — Progress Notes (Signed)
She presents today for follow-up of a painful ulcer first metatarsal phalangeal joint of the right foot.  Objective: Vital signs are stable alert and oriented x3.  Pulses are nonpalpable.  She has been seen by vascular who states that she is on the borderline of needing surgery.  Severe bunion deformity with a resultant arterial ulceration which has gone on to heal just barely with a very superficial scab.  No signs of infection.  Assessment: Peripheral vascular disease with ulceration.  Plan: Told her son to continue the same treatment plan but she cut the shoe open so that the bunion would have less pressure.  So we cut that out today in the office.  At his request.  I will follow-up with her on an as-needed basis.

## 2018-10-18 ENCOUNTER — Other Ambulatory Visit: Payer: Self-pay

## 2018-10-18 ENCOUNTER — Inpatient Hospital Stay
Admission: EM | Admit: 2018-10-18 | Discharge: 2018-10-21 | DRG: 394 | Disposition: A | Discharge status: Home Health | Payer: Medicare Other | Attending: Internal Medicine | Admitting: Internal Medicine

## 2018-10-18 ENCOUNTER — Emergency Department: Payer: Medicare Other

## 2018-10-18 DIAGNOSIS — K222 Esophageal obstruction: Secondary | ICD-10-CM | POA: Diagnosis present

## 2018-10-18 DIAGNOSIS — Z1159 Encounter for screening for other viral diseases: Secondary | ICD-10-CM

## 2018-10-18 DIAGNOSIS — N183 Chronic kidney disease, stage 3 unspecified: Secondary | ICD-10-CM | POA: Diagnosis present

## 2018-10-18 DIAGNOSIS — E0590 Thyrotoxicosis, unspecified without thyrotoxic crisis or storm: Secondary | ICD-10-CM | POA: Diagnosis present

## 2018-10-18 DIAGNOSIS — T18128A Food in esophagus causing other injury, initial encounter: Principal | ICD-10-CM | POA: Diagnosis present

## 2018-10-18 DIAGNOSIS — R7989 Other specified abnormal findings of blood chemistry: Secondary | ICD-10-CM | POA: Diagnosis not present

## 2018-10-18 DIAGNOSIS — I130 Hypertensive heart and chronic kidney disease with heart failure and stage 1 through stage 4 chronic kidney disease, or unspecified chronic kidney disease: Secondary | ICD-10-CM | POA: Diagnosis present

## 2018-10-18 DIAGNOSIS — Z880 Allergy status to penicillin: Secondary | ICD-10-CM

## 2018-10-18 DIAGNOSIS — Z79899 Other long term (current) drug therapy: Secondary | ICD-10-CM

## 2018-10-18 DIAGNOSIS — I5032 Chronic diastolic (congestive) heart failure: Secondary | ICD-10-CM | POA: Diagnosis present

## 2018-10-18 DIAGNOSIS — Z7982 Long term (current) use of aspirin: Secondary | ICD-10-CM

## 2018-10-18 DIAGNOSIS — E039 Hypothyroidism, unspecified: Secondary | ICD-10-CM | POA: Diagnosis present

## 2018-10-18 DIAGNOSIS — I2510 Atherosclerotic heart disease of native coronary artery without angina pectoris: Secondary | ICD-10-CM | POA: Diagnosis present

## 2018-10-18 DIAGNOSIS — R778 Other specified abnormalities of plasma proteins: Secondary | ICD-10-CM

## 2018-10-18 LAB — COMPREHENSIVE METABOLIC PANEL
ALT: 21 U/L (ref 0–44)
AST: 23 U/L (ref 15–41)
Albumin: 4 g/dL (ref 3.5–5.0)
Alkaline Phosphatase: 47 U/L (ref 38–126)
Anion gap: 10 (ref 5–15)
BUN: 62 mg/dL — ABNORMAL HIGH (ref 8–23)
CO2: 26 mmol/L (ref 22–32)
Calcium: 9.9 mg/dL (ref 8.9–10.3)
Chloride: 102 mmol/L (ref 98–111)
Creatinine, Ser: 1.23 mg/dL — ABNORMAL HIGH (ref 0.44–1.00)
GFR calc Af Amer: 41 mL/min — ABNORMAL LOW (ref >60)
GFR calc non Af Amer: 36 mL/min — ABNORMAL LOW (ref >60)
Glucose, Bld: 119 mg/dL — ABNORMAL HIGH (ref 70–99)
Potassium: 4.4 mmol/L (ref 3.5–5.1)
Sodium: 138 mmol/L (ref 135–145)
Total Bilirubin: 0.6 mg/dL (ref 0.3–1.2)
Total Protein: 7.6 g/dL (ref 6.5–8.1)

## 2018-10-18 LAB — CBC WITH DIFFERENTIAL/PLATELET
Abs Immature Granulocytes: 0.04 10*3/uL (ref 0.00–0.07)
Basophils Absolute: 0 10*3/uL (ref 0.0–0.1)
Basophils Relative: 0 %
Eosinophils Absolute: 0.1 10*3/uL (ref 0.0–0.5)
Eosinophils Relative: 1 %
HCT: 34.6 % — ABNORMAL LOW (ref 36.0–46.0)
Hemoglobin: 11.4 g/dL — ABNORMAL LOW (ref 12.0–15.0)
Immature Granulocytes: 1 %
Lymphocytes Relative: 13 %
Lymphs Abs: 0.9 10*3/uL (ref 0.7–4.0)
MCH: 32.5 pg (ref 26.0–34.0)
MCHC: 32.9 g/dL (ref 30.0–36.0)
MCV: 98.6 fL (ref 80.0–100.0)
Monocytes Absolute: 0.6 10*3/uL (ref 0.1–1.0)
Monocytes Relative: 8 %
Neutro Abs: 5.2 10*3/uL (ref 1.7–7.7)
Neutrophils Relative %: 77 %
Platelets: 155 10*3/uL (ref 150–400)
RBC: 3.51 MIL/uL — ABNORMAL LOW (ref 3.87–5.11)
RDW: 14.1 % (ref 11.5–15.5)
WBC: 6.8 10*3/uL (ref 4.0–10.5)
nRBC: 0 % (ref 0.0–0.2)

## 2018-10-18 LAB — TROPONIN I (HIGH SENSITIVITY): Troponin I (High Sensitivity): 23 ng/L — ABNORMAL HIGH (ref <18)

## 2018-10-18 NOTE — ED Notes (Signed)
Patient transported to X-ray 

## 2018-10-18 NOTE — ED Triage Notes (Addendum)
Pt states "I swallowed my food wrong"  Pt complains of central chest "funny feeling". Pt appears in no acute distress. No drooling noted. No resp distress noted. Pt states "I can't swallow it down".

## 2018-10-19 ENCOUNTER — Other Ambulatory Visit: Payer: Self-pay

## 2018-10-19 ENCOUNTER — Encounter: Payer: Self-pay | Admitting: Anesthesiology

## 2018-10-19 ENCOUNTER — Encounter
Admission: EM | Disposition: A | Discharge status: Home Health | Payer: Self-pay | Source: Home / Self Care | Attending: Internal Medicine

## 2018-10-19 DIAGNOSIS — T18128A Food in esophagus causing other injury, initial encounter: Secondary | ICD-10-CM | POA: Diagnosis not present

## 2018-10-19 DIAGNOSIS — K222 Esophageal obstruction: Secondary | ICD-10-CM | POA: Diagnosis not present

## 2018-10-19 DIAGNOSIS — R079 Chest pain, unspecified: Secondary | ICD-10-CM

## 2018-10-19 LAB — BASIC METABOLIC PANEL
Anion gap: 7 (ref 5–15)
BUN: 52 mg/dL — ABNORMAL HIGH (ref 8–23)
CO2: 27 mmol/L (ref 22–32)
Calcium: 9.3 mg/dL (ref 8.9–10.3)
Chloride: 106 mmol/L (ref 98–111)
Creatinine, Ser: 1.08 mg/dL — ABNORMAL HIGH (ref 0.44–1.00)
GFR calc Af Amer: 48 mL/min — ABNORMAL LOW (ref >60)
GFR calc non Af Amer: 42 mL/min — ABNORMAL LOW (ref >60)
Glucose, Bld: 111 mg/dL — ABNORMAL HIGH (ref 70–99)
Potassium: 4.6 mmol/L (ref 3.5–5.1)
Sodium: 140 mmol/L (ref 135–145)

## 2018-10-19 LAB — TROPONIN I (HIGH SENSITIVITY)
Troponin I (High Sensitivity): 57 ng/L — ABNORMAL HIGH (ref <18)
Troponin I (High Sensitivity): 210 ng/L (ref <18)
Troponin I (High Sensitivity): 238 ng/L (ref <18)

## 2018-10-19 LAB — CBC
HCT: 32 % — ABNORMAL LOW (ref 36.0–46.0)
Hemoglobin: 10.6 g/dL — ABNORMAL LOW (ref 12.0–15.0)
MCH: 32.2 pg (ref 26.0–34.0)
MCHC: 33.1 g/dL (ref 30.0–36.0)
MCV: 97.3 fL (ref 80.0–100.0)
Platelets: 147 10*3/uL — ABNORMAL LOW (ref 150–400)
RBC: 3.29 MIL/uL — ABNORMAL LOW (ref 3.87–5.11)
RDW: 13.8 % (ref 11.5–15.5)
WBC: 9.5 10*3/uL (ref 4.0–10.5)
nRBC: 0 % (ref 0.0–0.2)

## 2018-10-19 LAB — PROTIME-INR
INR: 1 (ref 0.8–1.2)
Prothrombin Time: 13.1 seconds (ref 11.4–15.2)

## 2018-10-19 LAB — SARS CORONAVIRUS 2 BY RT PCR (HOSPITAL ORDER, PERFORMED IN ~~LOC~~ HOSPITAL LAB): SARS Coronavirus 2: NEGATIVE

## 2018-10-19 SURGERY — EGD (ESOPHAGOGASTRODUODENOSCOPY)
Anesthesia: Choice

## 2018-10-19 MED ORDER — LIDOCAINE HCL (PF) 2 % IJ SOLN
INTRAMUSCULAR | Status: AC
  Filled 2018-10-19: qty 10

## 2018-10-19 MED ORDER — ONDANSETRON HCL 4 MG/2ML IJ SOLN
4.0000 mg | Freq: Once | INTRAMUSCULAR | Status: AC
  Administered 2018-10-19: 4 mg via INTRAVENOUS
  Filled 2018-10-19: qty 2

## 2018-10-19 MED ORDER — SODIUM CHLORIDE 0.9 % IV SOLN
INTRAVENOUS | Status: DC
  Administered 2018-10-19 – 2018-10-21 (×2): via INTRAVENOUS

## 2018-10-19 MED ORDER — HYDRALAZINE HCL 20 MG/ML IJ SOLN: 10.0000 mg | Freq: Four times a day (QID) | INTRAMUSCULAR | Status: DC | PRN

## 2018-10-19 MED ORDER — GLUCAGON HCL RDNA (DIAGNOSTIC) 1 MG IJ SOLR
1.0000 mg | Freq: Once | INTRAMUSCULAR | Status: AC
  Administered 2018-10-19: 1 mg via INTRAVENOUS
  Filled 2018-10-19: qty 1

## 2018-10-19 MED ORDER — PROPOFOL 10 MG/ML IV BOLUS
INTRAVENOUS | Status: AC
  Filled 2018-10-19: qty 20

## 2018-10-19 MED ORDER — ONDANSETRON HCL 4 MG/2ML IJ SOLN: 4.0000 mg | Freq: Four times a day (QID) | INTRAMUSCULAR | Status: DC | PRN

## 2018-10-19 MED ORDER — SODIUM CHLORIDE 0.9 % IV BOLUS: 500.0000 mL | Freq: Once | INTRAVENOUS | Status: DC

## 2018-10-19 MED ORDER — ONDANSETRON HCL 4 MG PO TABS: 4.0000 mg | ORAL_TABLET | Freq: Four times a day (QID) | ORAL | Status: DC | PRN

## 2018-10-19 NOTE — H&P (Signed)
[facility] NAME: Sandy Roberson    MR#:  [id number]  DATE OF BIRTH:  [birth year]  DATE OF ADMISSION:  10/18/2018  PRIMARY CARE PHYSICIAN: Tracie Harrier, MD   REQUESTING/REFERRING PHYSICIAN: Rudene Re, MD  CHIEF COMPLAINT:   Chief Complaint  Patient presents with  . Chest Pain    HISTORY OF PRESENT ILLNESS:  Sandy Roberson  is a 85 y.o. female with a known history of CAD, hypertension, chronic kidney disease, diastolic CHF.  She presented to the emergency room accompanied by her son reporting "food stuck in her esophagus ".  Patient had eaten fish, rice, and kale just before feeling the bolus impaction in her esophagus.  Her son has tried getting her to drink water and coke at home but everything she drinks seems to come right back up.  Patient continues to feel there is something impacted in her esophagus.  She had been able to swallow saliva however now reports she is having difficulty with that as well.  She denies shortness of breath or chest pain other than when she attempts to swallow.  No palpitations.  She denies fever, chills, nausea, vomiting.  She denies abdominal pain.  She denies postnasal drainage.  Patient's son reports she has been known to have 3 separate episodes over the last 3 to 4 months however he usually has her drink water or Coke and after about 10 minutes the episode resolves.  Initial high-sensitivity troponin was 23.  Repeated high-sensitivity troponin is 57.  No significant EKG findings. We have admitted her to the hospitalist service for further management.  Dr. Allen Norris was consulted by the ED physician planning endoscopy in the a.m.  Patient is being held n.p.o.  PAST MEDICAL HISTORY:   Past Medical History:  Diagnosis Date  . Coronary artery disease   . Hypertension   . Renal disorder     PAST SURGICAL HISTORY:   Past Surgical History:  Procedure Laterality Date  . CORONARY  STENT PLACEMENT    . FRACTURE SURGERY    . STENT PLACEMENT RT URETER ([place] HX)     since been removed     SOCIAL HISTORY:   Social History   Tobacco Use  . Smoking status: Never Smoker  . Smokeless tobacco: Never Used  Substance Use Topics  . Alcohol use: No    FAMILY HISTORY:   Family History  Problem Relation Age of Onset  . Heart failure Neg Hx     DRUG ALLERGIES:   Allergies  Allergen Reactions  . Penicillins Rash    REVIEW OF SYSTEMS:   Review of Systems  Constitutional: Negative for chills, fever and malaise/fatigue.  HENT: Negative for congestion, nosebleeds, sinus pain and sore throat.   Eyes: Negative for blurred vision, double vision and pain.  Respiratory: Negative for cough, sputum production, shortness of breath and wheezing.   Cardiovascular: Positive for chest pain (with swallowing). Negative for palpitations.  Gastrointestinal: Negative for abdominal pain, blood in stool, constipation, diarrhea, heartburn, nausea and vomiting.  Genitourinary: Negative for dysuria and flank pain.  Musculoskeletal: Negative for falls and myalgias.  Skin: Negative for itching and rash.  Neurological: Negative for dizziness, focal weakness, weakness and headaches.  Psychiatric/Behavioral: Negative.  Negative for depression.    MEDICATIONS AT HOME:   Prior to Admission medications   Medication Sig Start Date End Date Taking? Authorizing Provider  ASPIRIN 81 PO Frequency:TAKE 1 TABLET DAILY.   Dosage:0.0  Instructions:  Note: 07/12/10   [provider]  calcitRIOL (ROCALTROL) 0.25 MCG capsule Take 1 capsule by mouth daily. Daily at 1800 10/20/12   [provider]  carvedilol (COREG) 3.125 MG tablet Take 1 tablet (3.125 mg total) by mouth 2 (two) times daily with a meal. 09/25/14   Gladstone Lighter, MD  methimazole (TAPAZOLE) 5 MG tablet Take 1 tablet by mouth daily.    [provider]  nitrofurantoin (MACRODANTIN) 100 MG capsule Take 100 mg  by mouth daily.    [provider]      VITAL SIGNS:  Blood pressure (!) 100/49, pulse 89, temperature 97.6 F (36.4 C), temperature source Oral, resp. rate 14, height 5\' 2"  (1.575 m), weight 49.9 kg, SpO2 92 %.  PHYSICAL EXAMINATION:  Physical Exam  GENERAL:  85 y.o.-year-old patient lying in the bed with no acute distress.  EYES: Pupils equal, round, reactive to light and accommodation. No scleral icterus. Extraocular muscles intact.  HEENT: Head atraumatic, normocephalic. Oropharynx and nasopharynx clear. Unable to swallow saliva NECK:  Supple, no jugular venous distention. No thyroid enlargement, no tenderness.  LUNGS: Normal breath sounds bilaterally, no wheezing, rales,rhonchi or crepitation. No use of accessory muscles of respiration.  CARDIOVASCULAR: Regular rate and rhythm, S1, S2 normal. No murmurs, rubs, or gallops.  ABDOMEN: Soft, nondistended, nontender. Bowel sounds present. No organomegaly or mass.  EXTREMITIES: No pedal edema, cyanosis, or clubbing.  NEUROLOGIC: Decreased hearing acuity. Muscle strength 5/5 in all extremities. Sensation intact. Gait not checked.  PSYCHIATRIC: The patient is alert and oriented x 3.  Normal affect and good eye contact. SKIN: No obvious rash, lesion, or ulcer.   LABORATORY PANEL:   CBC Recent Labs  Lab 10/18/18 2226  WBC 6.8  HGB 11.4*  HCT 34.6*  PLT 155   ------------------------------------------------------------------------------------------------------------------  Chemistries  Recent Labs  Lab 10/18/18 2226  NA 138  K 4.4  CL 102  CO2 26  GLUCOSE 119*  BUN 62*  CREATININE 1.23*  CALCIUM 9.9  AST 23  ALT 21  ALKPHOS 47  BILITOT 0.6   ------------------------------------------------------------------------------------------------------------------  Cardiac Enzymes No results for input(s): TROPONINI in the last 168 hours.  ------------------------------------------------------------------------------------------------------------------  RADIOLOGY:  Dg Chest 2 View  Result Date: 10/18/2018 CLINICAL DATA:  85 year old with chest pain. EXAM: CHEST - 2 VIEW COMPARISON:  04/05/2016 FINDINGS: Chronic cardiomegaly, unchanged from prior. Aortic tortuosity. Rightward tracheal deviation as before. No acute airspace disease. No pleural fluid or pneumothorax. Multilevel degenerative change in the spine. Mild chronic central bronchitic change. Stable left apical calcification which may be pulmonary or secondary to enlarged thyroid gland with calcification. IMPRESSION: 1. No acute findings. 2. Chronic cardiomegaly, unchanged. 3. Chronic rightward tracheal deviation, possibly due to enlarged thyroid gland, stable since 2017. Electronically Signed   By: Keith Rake M.D.   On: 10/18/2018 23:28      IMPRESSION AND PLAN:   1.  Esophageal food bolus impaction - Patient is being held n.p.o. -Dr. Allen Norris consulted by the ED physician and is planning endoscopy procedure in the a.m. - She has normal saline infusing to a peripheral IV at 50 cc/h to continue gentle hydration.  2.  Elevated troponin - Initial troponin 23 then increased to 57.  Patient is only experiencing chest pain related to swallowing.  EKG demonstrating no ischemia. - We will continue telemetry monitoring -We will repeat troponin per protocol - She does not have symptoms consistent with acute coronary syndrome.  However we will continue to observe closely.  3.  CKD - We will continue to monitor BUN and creatinine closely with gentle rehydration.  4.  Hypothyroidism - We will get TSH - Methimazole will be restarted when patient is tolerating p.o.  5.  Hypertension - Will treat with PRN IV antihypertensives for now until patient is tolerating p.o.  We will initiate DVT and PPI prophylaxis    All the records are reviewed and case discussed with ED  provider. The plan of care was discussed in details with the patient (and family). I answered all questions. The patient agreed to proceed with the above mentioned plan. Further management will depend upon hospital course.   CODE STATUS: Full code  TOTAL TIME TAKING CARE OF THIS PATIENT: 45 minutes.    [place] on 10/19/2018 at 1:52 AM  Pager - [phone]  After 6pm go to [email or web address] - Proofreader  [facility]  Office  [phone]  CC: Primary care physician; Tracie Harrier, MD   Note: This dictation was prepared with Dragon dictation along with smaller phrase technology. Any transcriptional errors that result from this process are unintentional.

## 2018-10-19 NOTE — ED Notes (Signed)
Date and time results received: 10/19/18  (use smartphrase ".now" to insert current time)  Test: troponin  Critical Value: 27  Name of Provider Notified: Dr. Alfred Levins  Orders Received? Or Actions Taken?: acknowledged

## 2018-10-19 NOTE — Care Management Obs Status (Signed)
MEDICARE OBSERVATION STATUS NOTIFICATION   Patient Details  Name: Sandy Roberson MRN: [id number] Date of Birth: 06-23-1915   Medicare Observation Status Notification Given:  Yes    Ines Warf A Chakita Mcgraw, RN 10/19/2018, 11:39 AM

## 2018-10-19 NOTE — ED Notes (Signed)
Pt given sips of cola; able to swallow 2 sips, burping after, pt with saliva spitting up

## 2018-10-19 NOTE — Progress Notes (Signed)
MD notified: This patients troponin levels has increased to 210 this morning. last rescorded was 52 at [facility]. She also had 9 runs of Vtach this morning.

## 2018-10-19 NOTE — ED Notes (Signed)
Patient unable to tolerate coke at this time. Ed at bedside.

## 2018-10-19 NOTE — Progress Notes (Addendum)
Spoke with Dr. Darvin Neighbours and EKG to be obtained for elevated troponin levels. MD to consult cardiology.   EKG results Sinus rhythm with 1st degree AV block. ST &T wave abnormality, Prolonged QT. EKG results in chart

## 2018-10-19 NOTE — Consult Note (Signed)
Lucilla Lame, MD [facility]  [address] [place],  [address] Phone: [phone] Fax : [phone]  Consultation  Referring Provider:     Dr. Darvin Neighbours Primary Care Physician:  Tracie Harrier, MD Primary Gastroenterologist: Althia Forts         Reason for Consultation:     Foreign body with dysphasia  Date of Admission:  10/18/2018 Date of Consultation:  10/19/2018         HPI:   Sandy Roberson is a 85 y.o. female who has had difficulty swallowing for the last few weeks.  The patient was admitted to the hospital last night with acute onset of inability swallow.  The patient had a in fish with rice and kale.  The patient had tried to drink some water and coke but everything seemed to come right back up.  In the emergency room the patient was having trouble swallowing her own saliva.  This morning the patient states that she is able to swallow her saliva and has not had any further chest discomfort.  The patient has had similar episodes over the last 3 to 4 months but usually is resolved by drinking some water. At admission the patient's high-sensitivity troponin was 23 and a repeat went up to 57.  This morning the repeat prior to having an endoscopy was over 200 and a cardiology consult was called.  The patient had been n.p.o. since last night.  Past Medical History:  Diagnosis Date  . Coronary artery disease   . Hypertension   . Renal disorder     Past Surgical History:  Procedure Laterality Date  . CORONARY STENT PLACEMENT    . FRACTURE SURGERY    . STENT PLACEMENT RT URETER ([place] HX)     since been removed     Prior to Admission medications   Medication Sig Start Date End Date Taking? Authorizing Provider  ASPIRIN 81 PO Take 81 mg by mouth daily.  07/12/10  Yes [provider]  calcitRIOL (ROCALTROL) 0.25 MCG capsule Take 1 capsule by mouth daily. Daily at 1800 10/20/12  Yes [provider]  carvedilol (COREG) 3.125 MG tablet Take 1 tablet  (3.125 mg total) by mouth 2 (two) times daily with a meal. 09/25/14  Yes Gladstone Lighter, MD  methimazole (TAPAZOLE) 5 MG tablet Take 1 tablet by mouth daily.   Yes [provider]  nitrofurantoin (MACRODANTIN) 100 MG capsule Take 100 mg by mouth daily.   Yes [provider]    Family History  Problem Relation Age of Onset  . Heart failure Neg Hx      Social History   Tobacco Use  . Smoking status: Never Smoker  . Smokeless tobacco: Never Used  Substance Use Topics  . Alcohol use: No  . Drug use: Not on file    Allergies as of 10/18/2018 - Review Complete 10/18/2018  Allergen Reaction Noted  . Penicillins Rash 09/19/2014    Review of Systems:    All systems reviewed and negative except where noted in HPI.   Physical Exam:  Vital signs in last 24 hours: Temp:  [97.6 F (36.4 C)-98.3 F (36.8 C)] 97.6 F (36.4 C) (07/05 0438) Pulse Rate:  [82-102] 82 (07/05 0438) Resp:  [14-21] 17 (07/05 0438) BP: (100-172)/(49-90) 164/88 (07/05 0438) SpO2:  [92 %-98 %] 98 % (07/05 0438) Weight:  [49.9 kg] 49.9 kg (07/04 2218) Last BM Date: 10/18/18 General:   Pleasant, cooperative in NAD Head:  Normocephalic and atraumatic. Eyes:  No icterus.   Conjunctiva pink. PERRLA. Ears: Patient is extremely hard of hearing even with hearing aids in place. Neck:  Supple; no masses or thyroidomegaly Lungs: Respirations even and unlabored. Lungs clear to auscultation bilaterally.   No wheezes, crackles, or rhonchi.  Heart:  Regular rate and rhythm;  Without murmur, clicks, rubs or gallops Abdomen:  Soft, nondistended, nontender. Normal bowel sounds. No appreciable masses or hepatomegaly.  No rebound or guarding.  Rectal:  Not performed. Msk:  Symmetrical without gross deformities.   Extremities:  Without edema, cyanosis or clubbing. Neurologic:  Alert and oriented x3;  grossly normal neurologically. Skin:  Intact without significant lesions or rashes. Cervical Nodes:  No  significant cervical adenopathy. Psych:  Alert and cooperative. Normal affect.  LAB RESULTS: Recent Labs    10/18/18 2226 10/19/18 0513  WBC 6.8 9.5  HGB 11.4* 10.6*  HCT 34.6* 32.0*  PLT 155 147*   BMET Recent Labs    10/18/18 2226 10/19/18 0513  NA 138 140  K 4.4 4.6  CL 102 106  CO2 26 27  GLUCOSE 119* 111*  BUN 62* 52*  CREATININE 1.23* 1.08*  CALCIUM 9.9 9.3   LFT Recent Labs    10/18/18 2226  PROT 7.6  ALBUMIN 4.0  AST 23  ALT 21  ALKPHOS 47  BILITOT 0.6   PT/INR Recent Labs    10/19/18 0513  LABPROT 13.1  INR 1.0    STUDIES: Dg Chest 2 View  Result Date: 10/18/2018 CLINICAL DATA:  85 year old with chest pain. EXAM: CHEST - 2 VIEW COMPARISON:  04/05/2016 FINDINGS: Chronic cardiomegaly, unchanged from prior. Aortic tortuosity. Rightward tracheal deviation as before. No acute airspace disease. No pleural fluid or pneumothorax. Multilevel degenerative change in the spine. Mild chronic central bronchitic change. Stable left apical calcification which may be pulmonary or secondary to enlarged thyroid gland with calcification. IMPRESSION: 1. No acute findings. 2. Chronic cardiomegaly, unchanged. 3. Chronic rightward tracheal deviation, possibly due to enlarged thyroid gland, stable since 2017. Electronically Signed   By: Keith Rake M.D.   On: 10/18/2018 23:28      Impression / Plan:   Assessment: Active Problems:   CKD (chronic kidney disease) stage 3, GFR 30-59 ml/min (HCC)   Esophageal obstruction due to food impaction   Sandy Roberson is a 85 y.o. y/o female with dysphasia and the report of inability to swallow liquids or solids after having rice with fish yesterday.  The patient was evaluated by cardiology today and deemed not a candidate for any further cardiac work-up and stated that she was as optimal as she could be at this time from a cardiac point of view.  I have spoken to the son and had told him that cardiology was going to take a  look at her and if they said it was okay that we would plan to do the upper endoscopy on his mother.  The patient was given some water today and was able to drink approximately one half of a cup of water without bring it back up but states that she feels that it is going down slowly.  Plan:  The patient likely has a partial obstruction of her esophagus since she is able to tolerate liquids today.  The patient has been seen by cardiology who reported her to not be a candidate for a cardiac work-up due to her high risk and reported her to be optimized at the present time.  It was recommended that we proceed with the  EGD for dysphagia.  The patient will be set up for an EGD for tomorrow.  Patient and her son have been explained the plan and agree with it.  Thank you for involving me in the care of this patient.      LOS: 0 days   Lucilla Lame, MD  10/19/2018, 10:31 AM    Note: This dictation was prepared with Dragon dictation along with smaller phrase technology. Any transcriptional errors that result from this process are unintentional.

## 2018-10-19 NOTE — Plan of Care (Signed)
The patient was evaluated by Dr. Allen Norris this morning. The patient still has the sensation that she still has some issue when swallowing. The patient has remained NPO. IV fluids running. No falls. Walked two different times with the nurse in the hallway using the walker. The son was updated on the patient's care. Consent obtained from son for EGD tomorrow. The patient is stable at this time.  Problem: Clinical Measurements: Goal: Will remain free from infection Outcome: Progressing Goal: Cardiovascular complication will be avoided Outcome: Progressing   Problem: Activity: Goal: Risk for activity intolerance will decrease Outcome: Progressing

## 2018-10-19 NOTE — Progress Notes (Signed)
   10/19/18 1715  Clinical Encounter Type  Visited With Patient  Visit Type Initial  Referral From Nurse  Consult/Referral To Chaplain  Spiritual Encounters  Spiritual Needs Prayer  CH entered room and patient was awake and alert and able to carry conversation with Chief Strategy Officer. CH used small talk to build rapport. Recited Psalms 23 and prayed with patient as requested. Pastoral visit was appreciated. No further visits are necessary.

## 2018-10-19 NOTE — ED Notes (Signed)
Admitting md at the bedside

## 2018-10-19 NOTE — Care Management (Signed)
85 yr old lady for FB removal. Troponins have increased from 23 to 57 to now 230+. Discussed with Dr. Allen Norris. Will get cardiology consult. I have called the floor and talked to the nurse.

## 2018-10-19 NOTE — Consult Note (Signed)
Cardiology Consultation Note    Patient ID: Sandy Roberson, MRN: [id number], DOB/AGE: 06/08/1915 85 y.o. Admit date: 10/18/2018   Date of Consult: 10/19/2018 Primary Physician: Tracie Harrier, MD Primary Cardiologist: Dr. Clayborn Bigness  Chief Complaint: choking on food Reason for Consultation: abnormal troponin Requesting MD: Dr. Darvin Neighbours  HPI: Sandy Roberson is a 85 y.o. female with history of coronary artery disease status post PCI, chronic kidney disease, hypertension who was admitted with complaints of choking on food.  By report patient has had intermittent episodes of feeling food getting stuck in her esophagus.  She was unable to swallow her saliva.  She had no shortness of breath.  High-sensitivity troponin was drawn by the emergency room which showed moderate elevation with peak of 238.  Patient has mild CKD with a creatinine of 1.08 and a GFR of 48.  Electrocardiogram showed sinus rhythm with no ischemia.  Chest x-ray showed no acute findings. She is a difficult historian due to not being able to hear.   Past Medical History:  Diagnosis Date  . Coronary artery disease   . Hypertension   . Renal disorder       Surgical History:  Past Surgical History:  Procedure Laterality Date  . CORONARY STENT PLACEMENT    . FRACTURE SURGERY    . STENT PLACEMENT RT URETER ([place] HX)     since been removed      Home Meds: Prior to Admission medications   Medication Sig Start Date End Date Taking? Authorizing Provider  ASPIRIN 81 PO Take 81 mg by mouth daily.  07/12/10  Yes [provider]  calcitRIOL (ROCALTROL) 0.25 MCG capsule Take 1 capsule by mouth daily. Daily at 1800 10/20/12  Yes [provider]  carvedilol (COREG) 3.125 MG tablet Take 1 tablet (3.125 mg total) by mouth 2 (two) times daily with a meal. 09/25/14  Yes Gladstone Lighter, MD  methimazole (TAPAZOLE) 5 MG tablet Take 1 tablet by mouth daily.   Yes [provider]  nitrofurantoin  (MACRODANTIN) 100 MG capsule Take 100 mg by mouth daily.   Yes [provider]    Inpatient Medications:   . sodium chloride 50 mL/hr at 10/19/18 0500  . sodium chloride      Allergies:  Allergies  Allergen Reactions  . Penicillins Rash    Social History   Socioeconomic History  . Marital status: Widowed    Spouse name: Not on file  . Number of children: Not on file  . Years of education: Not on file  . Highest education level: Not on file  Occupational History  . Not on file  Social Needs  . Financial resource strain: Not on file  . Food insecurity    Worry: Not on file    Inability: Not on file  . Transportation needs    Medical: Not on file    Non-medical: Not on file  Tobacco Use  . Smoking status: Never Smoker  . Smokeless tobacco: Never Used  Substance and Sexual Activity  . Alcohol use: No  . Drug use: Not on file  . Sexual activity: Not on file  Lifestyle  . Physical activity    Days per week: Not on file    Minutes per session: Not on file  . Stress: Not on file  Relationships  . Social Herbalist on phone: Not on file    Gets together: Not on file    Attends religious service: Not on file  Active member of club or organization: Not on file    Attends meetings of clubs or organizations: Not on file    Relationship status: Not on file  . Intimate partner violence    Fear of current or ex partner: Not on file    Emotionally abused: Not on file    Physically abused: Not on file    Forced sexual activity: Not on file  Other Topics Concern  . Not on file  Social History Narrative  . Not on file     Family History  Problem Relation Age of Onset  . Heart failure Neg Hx      Review of Systems: A 12-system review of systems was performed and is negative except as noted in the HPI.  Labs: No results for input(s): CKTOTAL, CKMB, TROPONINI in the last 72 hours. Lab Results  Component Value Date   WBC 9.5 10/19/2018   HGB  10.6 (L) 10/19/2018   HCT 32.0 (L) 10/19/2018   MCV 97.3 10/19/2018   PLT 147 (L) 10/19/2018    Recent Labs  Lab 10/18/18 2226 10/19/18 0513  NA 138 140  K 4.4 4.6  CL 102 106  CO2 26 27  BUN 62* 52*  CREATININE 1.23* 1.08*  CALCIUM 9.9 9.3  PROT 7.6  --   BILITOT 0.6  --   ALKPHOS 47  --   ALT 21  --   AST 23  --   GLUCOSE 119* 111*   No results found for: CHOL, HDL, LDLCALC, TRIG No results found for: DDIMER  Radiology/Studies:  Dg Chest 2 View  Result Date: 10/18/2018 CLINICAL DATA:  85 year old with chest pain. EXAM: CHEST - 2 VIEW COMPARISON:  04/05/2016 FINDINGS: Chronic cardiomegaly, unchanged from prior. Aortic tortuosity. Rightward tracheal deviation as before. No acute airspace disease. No pleural fluid or pneumothorax. Multilevel degenerative change in the spine. Mild chronic central bronchitic change. Stable left apical calcification which may be pulmonary or secondary to enlarged thyroid gland with calcification. IMPRESSION: 1. No acute findings. 2. Chronic cardiomegaly, unchanged. 3. Chronic rightward tracheal deviation, possibly due to enlarged thyroid gland, stable since 2017. Electronically Signed   By: Keith Rake M.D.   On: 10/18/2018 23:28    Wt Readings from Last 3 Encounters:  10/18/18 49.9 kg  03/28/18 53.4 kg  04/05/16 53.5 kg    EKG: Normal sinus rhythm with nonspecific ST-T wave changes  Physical Exam:  Blood pressure (!) 164/88, pulse 82, temperature 97.6 F (36.4 C), temperature source Oral, resp. rate 17, height 5\' 2"  (1.575 m), weight 49.9 kg, SpO2 98 %. Body mass index is 20.12 kg/m. General: Well developed, well nourished, in no acute distress. Head: Normocephalic, atraumatic, sclera non-icteric, no xanthomas, nares are without discharge.  Neck: Negative for carotid bruits. JVD not elevated. Lungs: Clear bilaterally to auscultation without wheezes, rales, or rhonchi. Breathing is unlabored. Heart: RRR with S1 S2. No murmurs, rubs,  or gallops appreciated. Abdomen: Soft, non-tender, non-distended with normoactive bowel sounds. No hepatomegaly. No rebound/guarding. No obvious abdominal masses. Msk:  Strength and tone appear normal for age. Extremities: No clubbing or cyanosis. No edema.  Distal pedal pulses are 2+ and equal bilaterally. Neuro: Alert and oriented X 3. No facial asymmetry. No focal deficit. Moves all extremities spontaneously. Psych:  Responds to questions appropriately with a normal affect.     Assessment and Plan  85 year old female with history of coronary disease status post PCI admitted after presenting to the emergency room with complaints of  dysphagia and choking on food.  EKG showed no ischemia however troponin was drawn and showed a peak of 238 thus far.  This is high-sensitivity troponin.  Patient is not a candidate for invasive or noninvasive ischemic work-up at present as she is high risk due to age but is optimized as best as possible.  Would continue with current regimen proceed with EGD if needed to treat her dysphagia.  Signed, Teodoro Spray MD 10/19/2018, 8:55 AM Pager: [phone]

## 2018-10-19 NOTE — ED Provider Notes (Signed)
[facility] Emergency Department Provider Note  ____________________________________________  Time seen: Approximately 12:35 AM  I have reviewed the triage vital signs and the nursing notes.   HISTORY  Chief Complaint Chest Pain   HPI Sandy Roberson is a 85 y.o. female with history of CAD, hypertension, chronic kidney disease, diastolic CHF who presents for evaluation of esophageal food bolus impaction.  Patient is accompanied by her son.  According to them patient has had intermittent episodes of feeling food getting stuck in her esophagus.  Usually after 10 minutes these episodes resolved.  This evening patient had fish, rice, and kale and reports feeling a bolus impaction in her esophagus.  Son has tried water and coke at home but everything that she drinks comes right back up.  Patient continues to feel that there is something impacted in her esophagus.  She is able to swallow her saliva, she has no shortness of breath.  Past Medical History:  Diagnosis Date  . Coronary artery disease   . Hypertension   . Renal disorder     Patient Active Problem List   Diagnosis Date Noted  . Atherosclerosis of native arteries of the extremities with ulceration ([place]) 03/28/2018  . Chronic kidney disease 03/11/2018  . CKD (chronic kidney disease) stage 3, GFR 30-59 ml/min (HCC) 03/11/2018  . Coronary artery disease 03/11/2018  . Hyperlipidemia 03/11/2018  . Hypertension 03/11/2018  . Thyroid disease 03/11/2018  . SOB (shortness of breath) 04/05/2016  . Flu vaccine need 02/17/2016  . H/O dehydration 08/12/2015  . ITP (idiopathic thrombocytopenic purpura) 09/25/2014  . ARF (acute renal failure) ([place]) 09/25/2014  . Hypokalemia 09/25/2014  . Acute diastolic CHF (congestive heart failure) ([place]) 09/25/2014  . Sepsis due to urinary tract infection ([place]) 09/19/2014  . Hyponatremia 09/19/2014  . Kidney stone 11/12/2012  . Urge incontinence 11/12/2012  . Urinary  tract infection, site not specified 11/12/2012    Past Surgical History:  Procedure Laterality Date  . CORONARY STENT PLACEMENT    . FRACTURE SURGERY    . STENT PLACEMENT RT URETER ([place] HX)     since been removed     Prior to Admission medications   Medication Sig Start Date End Date Taking? Authorizing Provider  ASPIRIN 81 PO Frequency:TAKE 1 TABLET DAILY.   Dosage:0.0     Instructions:  Note: 07/12/10   [provider]  calcitRIOL (ROCALTROL) 0.25 MCG capsule Take 1 capsule by mouth daily. Daily at 1800 10/20/12   [provider]  carvedilol (COREG) 3.125 MG tablet Take 1 tablet (3.125 mg total) by mouth 2 (two) times daily with a meal. 09/25/14   Gladstone Lighter, MD  methimazole (TAPAZOLE) 5 MG tablet Take 1 tablet by mouth daily.    [provider]  nitrofurantoin (MACRODANTIN) 100 MG capsule Take 100 mg by mouth daily.    [provider]    Allergies Penicillins  Family History  Problem Relation Age of Onset  . Heart failure Neg Hx     Social History Social History   Tobacco Use  . Smoking status: Never Smoker  . Smokeless tobacco: Never Used  Substance Use Topics  . Alcohol use: No  . Drug use: Not on file    Review of Systems  Constitutional: Negative for fever. Eyes: Negative for visual changes. ENT: Negative for sore throat. Neck: No neck pain  Cardiovascular: Negative for chest pain. Respiratory: Negative for shortness of breath. Gastrointestinal: Negative for abdominal pain, vomiting or diarrhea. + esophageal bolus  impaction Genitourinary: Negative for dysuria. Musculoskeletal: Negative for back pain. Skin: Negative for rash. Neurological: Negative for headaches, weakness or numbness. Psych: No SI or HI  ____________________________________________   PHYSICAL EXAM:  VITAL SIGNS: ED Triage Vitals  Enc Vitals Group     BP 10/18/18 2217 (!) 100/49     Pulse Rate 10/18/18 2217 89     Resp 10/18/18 2217 14      Temp 10/18/18 2217 97.6 F (36.4 C)     Temp Source 10/18/18 2217 Oral     SpO2 10/18/18 2217 92 %     Weight 10/18/18 2218 110 lb (49.9 kg)     Height 10/18/18 2218 5\' 2"  (1.575 m)     Head Circumference --      Peak Flow --      Pain Score 10/18/18 2217 0     Pain Loc --      Pain Edu? --      Excl. in [place]? --     Constitutional: Alert and oriented. Well appearing and in no apparent distress. HEENT:      Head: Normocephalic and atraumatic.         Eyes: Conjunctivae are normal. Sclera is non-icteric.       Mouth/Throat: Mucous membranes are moist.       Neck: Supple with no signs of meningismus. Cardiovascular: Regular rate and rhythm. No murmurs, gallops, or rubs. 2+ symmetrical distal pulses are present in all extremities. No JVD. Respiratory: Normal respiratory effort. Lungs are clear to auscultation bilaterally. No wheezes, crackles, or rhonchi.  Gastrointestinal: Soft, non tender, and non distended with positive bowel sounds. No rebound or guarding. Musculoskeletal: Nontender with normal range of motion in all extremities. No edema, cyanosis, or erythema of extremities. Neurologic: Normal speech and language. Face is symmetric. Moving all extremities. No gross focal neurologic deficits are appreciated. Skin: Skin is warm, dry and intact. No rash noted. Psychiatric: Mood and affect are normal. Speech and behavior are normal.  ____________________________________________   LABS (all labs ordered are listed, but only abnormal results are displayed)  Labs Reviewed  CBC WITH DIFFERENTIAL/PLATELET - Abnormal; Notable for the following components:      Result Value   RBC 3.51 (*)    Hemoglobin 11.4 (*)    HCT 34.6 (*)    All other components within normal limits  COMPREHENSIVE METABOLIC PANEL - Abnormal; Notable for the following components:   Glucose, Bld 119 (*)    BUN 62 (*)    Creatinine, Ser 1.23 (*)    GFR calc non Af Amer 36 (*)    GFR calc Af Amer 41 (*)    All  other components within normal limits  TROPONIN I (HIGH SENSITIVITY) - Abnormal; Notable for the following components:   Troponin I (High Sensitivity) 23 (*)    All other components within normal limits  TROPONIN I (HIGH SENSITIVITY) - Abnormal; Notable for the following components:   Troponin I (High Sensitivity) 57 (*)    All other components within normal limits  TROPONIN I (HIGH SENSITIVITY)   ____________________________________________  EKG  ED ECG REPORT I, Rudene Re, the attending physician, personally viewed and interpreted this ECG.  Normal sinus rhythm, rate of 86, normal intervals, left axis deviation, no ST elevations or depressions.  Unchanged from prior from 2017  01:32 -normal sinus rhythm, rate of 88, left axis deviation, no ST elevations or depressions.  Unchanged from initial. ____________________________________________  RADIOLOGY  I have personally reviewed the images  performed during this visit and I agree with the Radiologist's read.   Interpretation by Radiologist:  Dg Chest 2 View  Result Date: 10/18/2018 CLINICAL DATA:  85 year old with chest pain. EXAM: CHEST - 2 VIEW COMPARISON:  04/05/2016 FINDINGS: Chronic cardiomegaly, unchanged from prior. Aortic tortuosity. Rightward tracheal deviation as before. No acute airspace disease. No pleural fluid or pneumothorax. Multilevel degenerative change in the spine. Mild chronic central bronchitic change. Stable left apical calcification which may be pulmonary or secondary to enlarged thyroid gland with calcification. IMPRESSION: 1. No acute findings. 2. Chronic cardiomegaly, unchanged. 3. Chronic rightward tracheal deviation, possibly due to enlarged thyroid gland, stable since 2017. Electronically Signed   By: Keith Rake M.D.   On: 10/18/2018 23:28     ____________________________________________   PROCEDURES  Procedure(s) performed: None Procedures Critical Care performed:  None  ____________________________________________   INITIAL IMPRESSION / ASSESSMENT AND PLAN / ED COURSE  85 y.o. female with history of CAD, hypertension, chronic kidney disease, diastolic CHF who presents for evaluation of esophageal food bolus impaction since dinner at [facility]. Patient has been unable to keep any fluids down. Handling her saliva with no difficulty. EKG, labs, and CXR were done in triage. EKG is non ischemic. First HS troponin is borderline at 23 therefore will get a repeat. Most likely due to CKD and reduced GFR. Will give IV zofran and glucagon and attempt PO challenge. If unsuccessful will page GI for urgent endoscopy.  Clinical Course as of Oct 18 136  Nancy Fetter Oct 19, 2018  0128 Patient failed PO challenge and is unable to tolerate PO. Her repeat troponin is trending up and is 57 however she has no CP unless she swallows and a repeat EKG continues to show no ischemia. Therefore I do not believe that patient's clinical picture is consistent with ACS but with esophageal food bolus impaction. Dr. Allen Norris has been paged.      [CV]  0137 Discussed with Dr. Allen Norris who recommended admission to Hospitalist due to elevating troponin. He will plan to do an urgent endoscopy in the morning. Patient to remain NPO. Will give gentle IV hydration.   [CV]    Clinical Course User Index [CV] Alfred Levins [place], MD     As part of my medical decision making, I reviewed the following data within the [place] notes reviewed and incorporated, Labs reviewed , EKG interpreted , Old chart reviewed, Radiograph reviewed , Discussed with admitting physician , A consult was requested and obtained from this/these consultant(s) GI, Notes from prior ED visits and [place] Controlled Substance Database    Pertinent labs & imaging results that were available during my care of the patient were reviewed by me and considered in my medical decision making (see chart for details).     ____________________________________________   FINAL CLINICAL IMPRESSION(S) / ED DIAGNOSES  Final diagnoses:  Food impaction of esophagus, initial encounter  Elevated troponin      NEW MEDICATIONS STARTED DURING THIS VISIT:  ED Discharge Orders    None       Note:  This document was prepared using Dragon voice recognition software and may include unintentional dictation errors.    Alfred Levins, [place], MD 10/19/18 [phone]

## 2018-10-19 NOTE — ED Notes (Signed)
ED Provider at bedside discussing POC and evaluating the drinking of the cola

## 2018-10-19 NOTE — ED Notes (Signed)
Patient assisted to the bathroom 

## 2018-10-20 ENCOUNTER — Observation Stay: Payer: Medicare Other | Admitting: Anesthesiology

## 2018-10-20 ENCOUNTER — Encounter: Payer: Self-pay | Admitting: Anesthesiology

## 2018-10-20 ENCOUNTER — Encounter
Admission: EM | Disposition: A | Discharge status: Home Health | Payer: Self-pay | Source: Home / Self Care | Attending: Internal Medicine

## 2018-10-20 DIAGNOSIS — T18128A Food in esophagus causing other injury, initial encounter: Secondary | ICD-10-CM

## 2018-10-20 DIAGNOSIS — E0590 Thyrotoxicosis, unspecified without thyrotoxic crisis or storm: Secondary | ICD-10-CM | POA: Diagnosis present

## 2018-10-20 DIAGNOSIS — Z880 Allergy status to penicillin: Secondary | ICD-10-CM | POA: Diagnosis not present

## 2018-10-20 DIAGNOSIS — I2510 Atherosclerotic heart disease of native coronary artery without angina pectoris: Secondary | ICD-10-CM | POA: Diagnosis present

## 2018-10-20 DIAGNOSIS — Z1159 Encounter for screening for other viral diseases: Secondary | ICD-10-CM | POA: Diagnosis not present

## 2018-10-20 DIAGNOSIS — I130 Hypertensive heart and chronic kidney disease with heart failure and stage 1 through stage 4 chronic kidney disease, or unspecified chronic kidney disease: Secondary | ICD-10-CM | POA: Diagnosis present

## 2018-10-20 DIAGNOSIS — Z7982 Long term (current) use of aspirin: Secondary | ICD-10-CM | POA: Diagnosis not present

## 2018-10-20 DIAGNOSIS — R7989 Other specified abnormal findings of blood chemistry: Secondary | ICD-10-CM | POA: Diagnosis present

## 2018-10-20 DIAGNOSIS — Z79899 Other long term (current) drug therapy: Secondary | ICD-10-CM | POA: Diagnosis not present

## 2018-10-20 DIAGNOSIS — K222 Esophageal obstruction: Secondary | ICD-10-CM | POA: Diagnosis present

## 2018-10-20 DIAGNOSIS — I5032 Chronic diastolic (congestive) heart failure: Secondary | ICD-10-CM | POA: Diagnosis present

## 2018-10-20 DIAGNOSIS — E039 Hypothyroidism, unspecified: Secondary | ICD-10-CM | POA: Diagnosis present

## 2018-10-20 DIAGNOSIS — W44F3XA Food entering into or through a natural orifice, initial encounter: Secondary | ICD-10-CM

## 2018-10-20 DIAGNOSIS — N183 Chronic kidney disease, stage 3 (moderate): Secondary | ICD-10-CM | POA: Diagnosis present

## 2018-10-20 HISTORY — PX: ESOPHAGOGASTRODUODENOSCOPY (EGD) WITH PROPOFOL: SHX5813

## 2018-10-20 SURGERY — ESOPHAGOGASTRODUODENOSCOPY (EGD) WITH PROPOFOL
Anesthesia: General

## 2018-10-20 MED ORDER — ETOMIDATE 2 MG/ML IV SOLN
INTRAVENOUS | Status: DC | PRN
  Administered 2018-10-20: 10 mg via INTRAVENOUS

## 2018-10-20 MED ORDER — PROPOFOL 500 MG/50ML IV EMUL
INTRAVENOUS | Status: AC
  Filled 2018-10-20: qty 50

## 2018-10-20 MED ORDER — METHIMAZOLE 5 MG PO TABS
5.0000 mg | ORAL_TABLET | Freq: Every day | ORAL | Status: DC
  Administered 2018-10-20 – 2018-10-21 (×2): 5 mg via ORAL
  Filled 2018-10-20 (×3): qty 1

## 2018-10-20 MED ORDER — ONDANSETRON HCL 4 MG/2ML IJ SOLN: 4.0000 mg | Freq: Once | INTRAMUSCULAR | Status: DC | PRN

## 2018-10-20 MED ORDER — SODIUM CHLORIDE 0.9 % IV SOLN
INTRAVENOUS | Status: DC
  Administered 2018-10-20: 13:00:00 via INTRAVENOUS

## 2018-10-20 MED ORDER — FENTANYL CITRATE (PF) 100 MCG/2ML IJ SOLN
INTRAMUSCULAR | Status: DC | PRN
  Administered 2018-10-20: 25 ug via INTRAVENOUS

## 2018-10-20 MED ORDER — FENTANYL CITRATE (PF) 100 MCG/2ML IJ SOLN: 25.0000 ug | INTRAMUSCULAR | Status: DC | PRN

## 2018-10-20 MED ORDER — TRAMADOL HCL 50 MG PO TABS
50.0000 mg | ORAL_TABLET | Freq: Once | ORAL | Status: AC
  Administered 2018-10-20: 50 mg via ORAL
  Filled 2018-10-20: qty 1

## 2018-10-20 MED ORDER — SUCCINYLCHOLINE CHLORIDE 20 MG/ML IJ SOLN
INTRAMUSCULAR | Status: DC | PRN
  Administered 2018-10-20: 60 mg via INTRAVENOUS

## 2018-10-20 MED ORDER — FENTANYL CITRATE (PF) 100 MCG/2ML IJ SOLN
INTRAMUSCULAR | Status: AC
  Filled 2018-10-20: qty 2

## 2018-10-20 MED ORDER — CARVEDILOL 6.25 MG PO TABS
3.1250 mg | ORAL_TABLET | Freq: Two times a day (BID) | ORAL | Status: DC
  Administered 2018-10-20 – 2018-10-21 (×2): 3.125 mg via ORAL
  Filled 2018-10-20 (×2): qty 1

## 2018-10-20 MED ORDER — ONDANSETRON HCL 4 MG/2ML IJ SOLN
INTRAMUSCULAR | Status: AC
  Filled 2018-10-20: qty 2

## 2018-10-20 MED ORDER — LIDOCAINE HCL (CARDIAC) PF 100 MG/5ML IV SOSY
PREFILLED_SYRINGE | INTRAVENOUS | Status: DC | PRN
  Administered 2018-10-20: 60 mg via INTRAVENOUS

## 2018-10-20 MED ORDER — ETOMIDATE 2 MG/ML IV SOLN
INTRAVENOUS | Status: AC
  Filled 2018-10-20: qty 10

## 2018-10-20 MED ORDER — LIDOCAINE HCL (PF) 2 % IJ SOLN
INTRAMUSCULAR | Status: AC
  Filled 2018-10-20: qty 10

## 2018-10-20 NOTE — Progress Notes (Signed)
[place] [facility] [place] NAME: Sandy Roberson    MR#:  [id number]  DATE OF BIRTH:  10/15/1915  SUBJECTIVE:   patient plan for EGD today.  No acute events overnight.  REVIEW OF SYSTEMS:    Review of Systems  Constitutional: Negative for fever, chills weight loss HENT: Negative for ear pain, nosebleeds, congestion, facial swelling, rhinorrhea, neck pain, neck stiffness and ear discharge.   Hard of hearing Respiratory: Negative for cough, shortness of breath, wheezing  Cardiovascular: Negative for chest pain, palpitations and leg swelling.  Gastrointestinal: Positive feeling as something is stuck in her throat.   Genitourinary: Negative for dysuria, urgency, frequency, hematuria Musculoskeletal: Negative for back pain or joint pain Neurological: Negative for dizziness, seizures, syncope, focal weakness,  numbness and headaches.  Hematological: Does not bruise/bleed easily.  Psychiatric/Behavioral: Negative for hallucinations, confusion, dysphoric mood    Tolerating Diet: npo      DRUG ALLERGIES:   Allergies  Allergen Reactions  . Penicillins Rash    VITALS:  Blood pressure (!) 148/92, pulse 75, temperature 98.4 F (36.9 C), temperature source Oral, resp. rate 17, height 5\' 2"  (1.575 m), weight 49.9 kg, SpO2 95 %.  PHYSICAL EXAMINATION:  Constitutional: Appears well-developed and well-nourished. No distress. HENT: Normocephalic. Marland Kitchen Oropharynx is clear and moist.  Eyes: Conjunctivae and EOM are normal. PERRLA, no scleral icterus.  Neck: Normal ROM. Neck supple. No JVD. No tracheal deviation. CVS: RRR, S1/S2 +, no murmurs, no gallops, no carotid bruit.  Pulmonary: Effort and breath sounds normal, no stridor, rhonchi, wheezes, rales.  Abdominal: Soft. BS +,  no distension, tenderness, rebound or guarding.  Musculoskeletal: Normal range of motion. No edema and no tenderness.  Neuro: Alert. CN 2-12 grossly intact. No focal  deficits. Skin: Skin is warm and dry. No rash noted. Psychiatric: Normal mood and affect.      LABORATORY PANEL:   CBC Recent Labs  Lab 10/19/18 0513  WBC 9.5  HGB 10.6*  HCT 32.0*  PLT 147*   ------------------------------------------------------------------------------------------------------------------  Chemistries  Recent Labs  Lab 10/18/18 2226 10/19/18 0513  NA 138 140  K 4.4 4.6  CL 102 106  CO2 26 27  GLUCOSE 119* 111*  BUN 62* 52*  CREATININE 1.23* 1.08*  CALCIUM 9.9 9.3  AST 23  --   ALT 21  --   ALKPHOS 47  --   BILITOT 0.6  --    ------------------------------------------------------------------------------------------------------------------  Cardiac Enzymes No results for input(s): TROPONINI in the last 168 hours. ------------------------------------------------------------------------------------------------------------------  RADIOLOGY:  Dg Chest 2 View  Result Date: 10/18/2018 CLINICAL DATA:  85 year old with chest pain. EXAM: CHEST - 2 VIEW COMPARISON:  04/05/2016 FINDINGS: Chronic cardiomegaly, unchanged from prior. Aortic tortuosity. Rightward tracheal deviation as before. No acute airspace disease. No pleural fluid or pneumothorax. Multilevel degenerative change in the spine. Mild chronic central bronchitic change. Stable left apical calcification which may be pulmonary or secondary to enlarged thyroid gland with calcification. IMPRESSION: 1. No acute findings. 2. Chronic cardiomegaly, unchanged. 3. Chronic rightward tracheal deviation, possibly due to enlarged thyroid gland, stable since 2017. Electronically Signed   By: Keith Rake M.D.   On: 10/18/2018 23:28     ASSESSMENT AND PLAN:   85 year old female chronic kidney disease stage III who presented to the emergency room due to a feeling of food being stuck in her esophagus.   1.  Esophageal obstruction due to food impaction: Plan is for EGD today.  2.  Elevated troponin:  Patient ruled out for ACS.  Patient cleared by cardiology for procedure. No ischemic work-up indicated.  3.  Essential hypertension: Patient can be restarted on Coreg after procedure if all goes well.  4.  Hyperthyroidism: Patient should continue on methimazole after procedure. She will need outpatient palliative care services upon discharge.   Management plans discussed with the patient and she is in agreement.  CODE STATUS: FULL  TOTAL TIME TAKING CARE OF THIS PATIENT: 30 minutes.     POSSIBLE D/C tomorrow, DEPENDING ON CLINICAL CONDITION.   Bettey Costa M.D on 10/20/2018 at 11:31 AM  Between 7am to 6pm - Pager - [phone] After 6pm go to [email or web address] - password [email or web address] [facility]  Office  [phone]  CC: Primary care physician; Tracie Harrier, MD  Note: This dictation was prepared with Dragon dictation along with smaller phrase technology. Any transcriptional errors that result from this process are unintentional.

## 2018-10-20 NOTE — Anesthesia Procedure Notes (Signed)
Procedure Name: Intubation Date/Time: 10/20/2018 1:12 PM Performed by: Hedda Slade, CRNA Pre-anesthesia Checklist: Patient identified, Patient being monitored, Timeout performed, Emergency Drugs available and Suction available Patient Re-evaluated:Patient Re-evaluated prior to induction Oxygen Delivery Method: Circle system utilized Preoxygenation: Pre-oxygenation with 100% oxygen Induction Type: IV induction Ventilation: Mask ventilation without difficulty Laryngoscope Size: 3 and McGraph Grade View: Grade I Tube type: Oral Tube size: 6.5 mm Number of attempts: 1 Airway Equipment and Method: Stylet Placement Confirmation: ETT inserted through vocal cords under direct vision,  positive ETCO2 and breath sounds checked- equal and bilateral Secured at: 20 cm Tube secured with: Tape Dental Injury: Teeth and Oropharynx as per pre-operative assessment

## 2018-10-20 NOTE — Anesthesia Preprocedure Evaluation (Addendum)
Anesthesia Evaluation  Patient identified by MRN, date of birth, ID band Patient awake    Reviewed: Allergy & Precautions, NPO status , Patient's Chart, lab work & pertinent test results  Airway Mallampati: III  TM Distance: <3 FB     Dental  (+) Chipped, Caps   Pulmonary           Cardiovascular hypertension, + CAD and +CHF       Neuro/Psych negative neurological ROS  negative psych ROS   GI/Hepatic Neg liver ROS,   Endo/Other  negative endocrine ROS  Renal/GU Renal InsufficiencyRenal disease  negative genitourinary   Musculoskeletal  (+) Arthritis , Osteoarthritis,    Abdominal   Peds negative pediatric ROS (+)  Hematology negative hematology ROS (+)   Anesthesia Other Findings Past Medical History: No date: Coronary artery disease No date: Hypertension No date: Renal disorder  Reproductive/Obstetrics                            Anesthesia Physical Anesthesia Plan  ASA: III  Anesthesia Plan: General   Post-op Pain Management:    Induction: Intravenous, Rapid sequence and Cricoid pressure planned  PONV Risk Score and Plan:   Airway Management Planned: Oral ETT  Additional Equipment:   Intra-op Plan:   Post-operative Plan: Extubation in OR  Informed Consent: I have reviewed the patients History and Physical, chart, labs and discussed the procedure including the risks, benefits and alternatives for the proposed anesthesia with the patient or authorized representative who has indicated his/her understanding and acceptance.     Dental advisory given  Plan Discussed with: CRNA and Surgeon  Anesthesia Plan Comments: (High risk patient , but cleared by cardiology with increased troponins)       Anesthesia Quick Evaluation

## 2018-10-20 NOTE — Anesthesia Post-op Follow-up Note (Signed)
Anesthesia QCDR form completed.        

## 2018-10-20 NOTE — Op Note (Signed)
[facility] Gastroenterology Patient Name: Sandy Roberson Procedure Date: 10/20/2018 12:53 PM MRN: [id number] Account #: [id number] Date of Birth: 1915-12-18 Admit Type: Inpatient Age: 85 Room: [facility] ENDO ROOM 4 Gender: Female Note Status: Finalized Procedure:            Upper GI endoscopy Indications:          Foreign body in the esophagus Providers:            Lucilla Lame MD, MD Medicines:            General Anesthesia Complications:        No immediate complications. Procedure:            Pre-Anesthesia Assessment:                       - Prior to the procedure, a History and Physical was                        performed, and patient medications and allergies were                        reviewed. The patient's tolerance of previous                        anesthesia was also reviewed. The risks and benefits of                        the procedure and the sedation options and risks were                        discussed with the patient. All questions were                        answered, and informed consent was obtained. Prior                        Anticoagulants: The patient has taken no previous                        anticoagulant or antiplatelet agents. ASA Grade                        Assessment: III - A patient with severe systemic                        disease. After reviewing the risks and benefits, the                        patient was deemed in satisfactory condition to undergo                        the procedure.                       After obtaining informed consent, the endoscope was                        passed under direct vision. Throughout the procedure,                        the patient's blood  pressure, pulse, and oxygen                        saturations were monitored continuously. The Endoscope                        was introduced through the mouth, and advanced to the                        second part of duodenum. The upper GI  endoscopy was                        accomplished without difficulty. The patient tolerated                        the procedure well. Findings:      Food was found at the gastroesophageal junction. Removal of food was       accomplished.      One benign-appearing, intrinsic moderate stenosis was found at the       gastroesophageal junction. The stenosis was traversed. A TTS dilator was       passed through the scope. Dilation with a 15-16.5-18 mm balloon dilator       was performed to 16.5 mm. The dilation site was examined following       endoscope reinsertion and showed moderate improvement in luminal       narrowing.      The stomach was normal.      The examined duodenum was normal. Impression:           - Food at the gastroesophageal junction. Removal was                        successful.                       - Benign-appearing esophageal stenosis. Dilated.                       - Normal stomach.                       - Normal examined duodenum. Recommendation:       - Return patient to hospital ward for ongoing care.                       - Mechanical soft diet.                       - Continue present medications. Procedure Code(s):    --- Professional ---                       [phone], Esophagogastroduodenoscopy, flexible, transoral;                        with removal of foreign body(s)                       43249, Esophagogastroduodenoscopy, flexible, transoral;                        with transendoscopic balloon dilation of esophagus                        (  less than 30 mm diameter) Diagnosis Code(s):    --- Professional ---                       Q94.765Y, Food in esophagus causing other injury,                        initial encounter                       K22.2, Esophageal obstruction CPT copyright 2019 American Medical Association. All rights reserved. The codes documented in this report are preliminary and upon coder review may  be revised to meet current compliance  requirements. Lucilla Lame MD, MD 10/20/2018 1:34:20 PM This report has been signed electronically. Number of Addenda: 0 Note Initiated On: 10/20/2018 12:53 PM Estimated Blood Loss: Estimated blood loss: none.      [facility]

## 2018-10-20 NOTE — Progress Notes (Signed)
The patient's son was notified that the patient will have and EGD at 1:19pm.

## 2018-10-20 NOTE — Transfer of Care (Signed)
Immediate Anesthesia Transfer of Care Note  Patient: Sandy Roberson  Procedure(s) Performed: ESOPHAGOGASTRODUODENOSCOPY (EGD) WITH PROPOFOL (N/A )  Patient Location: PACU  Anesthesia Type:General  Level of Consciousness: sedated  Airway & Oxygen Therapy: Patient Spontanous Breathing and Patient connected to nasal cannula oxygen  Post-op Assessment: Report given to RN and Post -op Vital signs reviewed and stable  Post vital signs: Reviewed and stable  Last Vitals:  Vitals Value Taken Time  BP 160/73 10/20/18 1341  Temp    Pulse 58 10/20/18 1342  Resp 14 10/20/18 1342  SpO2 100 % 10/20/18 1342  Vitals shown include unvalidated device data.  Last Pain:  Vitals:   10/20/18 1341  TempSrc:   PainSc: 0-No pain         Complications: No apparent anesthesia complications

## 2018-10-20 NOTE — Progress Notes (Signed)
Dr. Allen Norris provided verbal order for soft diet for the patient.

## 2018-10-21 ENCOUNTER — Encounter: Payer: Self-pay | Admitting: Gastroenterology

## 2018-10-21 MED ORDER — POLYETHYLENE GLYCOL 3350 17 G PO PACK
17.0000 g | PACK | Freq: Once | ORAL | Status: AC
  Administered 2018-10-21: 17 g via ORAL
  Filled 2018-10-21: qty 1

## 2018-10-21 MED ORDER — TRAMADOL HCL 50 MG PO TABS: 50.0000 mg | ORAL_TABLET | Freq: Four times a day (QID) | ORAL | 0 refills | Status: DC | PRN

## 2018-10-21 NOTE — Anesthesia Postprocedure Evaluation (Signed)
Anesthesia Post Note  Patient: Sandy Roberson  Procedure(s) Performed: ESOPHAGOGASTRODUODENOSCOPY (EGD) WITH PROPOFOL (N/A )  Patient location during evaluation: Endoscopy Anesthesia Type: General Level of consciousness: awake and alert and oriented Pain management: pain level controlled Vital Signs Assessment: post-procedure vital signs reviewed and stable Respiratory status: spontaneous breathing Cardiovascular status: blood pressure returned to baseline Anesthetic complications: no     Last Vitals:  Vitals:   10/21/18 0459 10/21/18 1000  BP: 124/61 (!) 141/61  Pulse: (!) 59 79  Resp: 17 17  Temp: 36.6 C 36.6 C  SpO2: 97% 98%    Last Pain:  Vitals:   10/21/18 1000  TempSrc: Oral  PainSc:                  Reyna Lorenzi

## 2018-10-21 NOTE — Discharge Summary (Addendum)
[place] [facility] [place] NAME: Sandy Roberson    MR#:  [id number]  DATE OF BIRTH:  Jul 29, 1915  DATE OF ADMISSION:  10/18/2018 ADMITTING PHYSICIAN: Christel Mormon, MD  DATE OF DISCHARGE: 10/21/2018  PRIMARY CARE PHYSICIAN: Tracie Harrier, MD    ADMISSION DIAGNOSIS:  Elevated troponin [R79.89] Food impaction of esophagus, initial encounter [T18.128A]  DISCHARGE DIAGNOSIS:  Active Problems:   CKD (chronic kidney disease) stage 3, GFR 30-59 ml/min (HCC)   Esophageal obstruction due to food impaction   Food impaction of esophagus   Stricture and stenosis of esophagus   SECONDARY DIAGNOSIS:   Past Medical History:  Diagnosis Date  . Coronary artery disease   . Hypertension   . Renal disorder     HOSPITAL COURSE:   85 year old female chronic kidney disease stage III who presented to the emergency room due to a feeling of food being stuck in her esophagus.   1.  Esophageal obstruction due to food impaction:EGD shows Impression:           - Food at the gastroesophageal junction. Removal was                        successful.                       - Benign-appearing esophageal stenosis. Dilated.                       - Normal stomach.                       - Normal examined duodenum..  2.  Elevated troponin: Patient ruled out for ACS.  Patient cleared by cardiology for procedure. No ischemic work-up indicated.  3.  Essential hypertension: She will continue Coreg.  4.  Hyperthyroidism: Continue methimazole.  She will need outpatient palliative care services upon discharge.    DISCHARGE CONDITIONS AND DIET:   Stable Mechanical soft diet  CONSULTS OBTAINED:  Treatment Team:  Lucilla Lame, MD Teodoro Spray, MD  DRUG ALLERGIES:   Allergies  Allergen Reactions  . Penicillins Rash    DISCHARGE MEDICATIONS:   Allergies as of 10/21/2018      Reactions   Penicillins Rash      Medication List    TAKE these  medications   ASPIRIN 81 PO Take 81 mg by mouth daily.   calcitRIOL 0.25 MCG capsule Commonly known as: ROCALTROL Take 1 capsule by mouth daily. Daily at 1800   carvedilol 3.125 MG tablet Commonly known as: Coreg Take 1 tablet (3.125 mg total) by mouth 2 (two) times daily with a meal.   methimazole 5 MG tablet Commonly known as: TAPAZOLE Take 1 tablet by mouth daily.   nitrofurantoin 100 MG capsule Commonly known as: MACRODANTIN Take 100 mg by mouth daily.   traMADol 50 MG tablet Commonly known as: Ultram Take 1 tablet (50 mg total) by mouth every 6 (six) hours as needed.         Today   CHIEF COMPLAINT:  No issues tolerated diet Wants stool softener last BM 2 days ago   VITAL SIGNS:  Blood pressure (!) 141/61, pulse 79, temperature 97.9 F (36.6 C), temperature source Oral, resp. rate 17, height 5\' 2"  (1.575 m), weight 49.9 kg, SpO2 98 %.   REVIEW OF SYSTEMS:  Review of Systems  Constitutional: Negative.  Negative  for chills, fever and malaise/fatigue.  HENT: Negative.  Negative for ear discharge, ear pain, hearing loss, nosebleeds and sore throat.   Eyes: Negative.  Negative for blurred vision and pain.  Respiratory: Negative.  Negative for cough, hemoptysis, shortness of breath and wheezing.   Cardiovascular: Negative.  Negative for chest pain, palpitations and leg swelling.  Gastrointestinal: Positive for constipation. Negative for abdominal pain, blood in stool, diarrhea, nausea and vomiting.  Genitourinary: Negative.  Negative for dysuria.  Musculoskeletal: Negative.  Negative for back pain.  Skin: Negative.   Neurological: Negative for dizziness, tremors, speech change, focal weakness, seizures and headaches.  Endo/Heme/Allergies: Negative.  Does not bruise/bleed easily.  Psychiatric/Behavioral: Positive for memory loss. Negative for depression, hallucinations and suicidal ideas.     PHYSICAL EXAMINATION:  GENERAL:  85 y.o.-year-old patient lying in  the bed with no acute distress.  NECK:  Supple, no jugular venous distention. No thyroid enlargement, no tenderness.  LUNGS: Normal breath sounds bilaterally, no wheezing, rales,rhonchi  No use of accessory muscles of respiration.  CARDIOVASCULAR: S1, S2 normal. No murmurs, rubs, or gallops.  ABDOMEN: Soft, non-tender, non-distended. Bowel sounds present. No organomegaly or mass.  EXTREMITIES: No pedal edema, cyanosis, or clubbing.  PSYCHIATRIC: The patient is alert and oriented x 3.  SKIN: No obvious rash, lesion, or ulcer.   DATA REVIEW:   CBC Recent Labs  Lab 10/19/18 0513  WBC 9.5  HGB 10.6*  HCT 32.0*  PLT 147*    Chemistries  Recent Labs  Lab 10/18/18 2226 10/19/18 0513  NA 138 140  K 4.4 4.6  CL 102 106  CO2 26 27  GLUCOSE 119* 111*  BUN 62* 52*  CREATININE 1.23* 1.08*  CALCIUM 9.9 9.3  AST 23  --   ALT 21  --   ALKPHOS 47  --   BILITOT 0.6  --     Cardiac Enzymes No results for input(s): TROPONINI in the last 168 hours.  Microbiology Results  @MICRORSLT48 @  RADIOLOGY:  No results found.    Allergies as of 10/21/2018      Reactions   Penicillins Rash      Medication List    TAKE these medications   ASPIRIN 81 PO Take 81 mg by mouth daily.   calcitRIOL 0.25 MCG capsule Commonly known as: ROCALTROL Take 1 capsule by mouth daily. Daily at 1800   carvedilol 3.125 MG tablet Commonly known as: Coreg Take 1 tablet (3.125 mg total) by mouth 2 (two) times daily with a meal.   methimazole 5 MG tablet Commonly known as: TAPAZOLE Take 1 tablet by mouth daily.   nitrofurantoin 100 MG capsule Commonly known as: MACRODANTIN Take 100 mg by mouth daily.   traMADol 50 MG tablet Commonly known as: Ultram Take 1 tablet (50 mg total) by mouth every 6 (six) hours as needed.          Management plans discussed with the patient and she is in agreement. Stable for discharge home with [facility] and outpatient palliative care services.  Patient should  follow up with PCP  CODE STATUS:     Code Status Orders  (From admission, onward)         Start     Ordered   10/19/18 0150  Full code  Continuous     10/19/18 0149        Code Status History    Date Active Date Inactive Code Status Order ID Comments User Context   04/05/2016 1836 04/06/2016 1723 Full  Code [id number]  Dustin Flock, MD Inpatient   09/19/2014 2314 09/25/2014 1757 Full Code [id number]  Hower, Aaron Mose, MD ED   Advance Care Planning Activity    Advance Directive Documentation     Most Recent Value  Type of Advance Directive  Healthcare Power of Attorney  Pre-existing out of facility DNR order (yellow form or pink MOST form)  -  "MOST" Form in Place?  -      TOTAL TIME TAKING CARE OF THIS PATIENT: 38 minutes.    Note: This dictation was prepared with Dragon dictation along with smaller phrase technology. Any transcriptional errors that result from this process are unintentional.  Bettey Costa M.D on 10/21/2018 at 10:50 AM  Between 7am to 6pm - Pager - [phone] After 6pm go to [email or web address] - password [email or web address]  Office  [phone]  CC: Primary care physician; Tracie Harrier, MD

## 2018-10-21 NOTE — Progress Notes (Signed)
MD notified: Sorry to bother you this patient's son Sandy Roberson) wanted to know if you could call him at some point to let him know about the prognosis for this patient, if the obstruction can occur again or what he needs to look out for. His phone number is [phone] and home phone number is [phone]

## 2018-10-21 NOTE — Plan of Care (Signed)
  Problem: Clinical Measurements: Goal: Will remain free from infection Outcome: Adequate for Discharge Goal: Cardiovascular complication will be avoided Outcome: Adequate for Discharge   Problem: Activity: Goal: Risk for activity intolerance will decrease Outcome: Adequate for Discharge

## 2018-10-23 ENCOUNTER — Encounter (INDEPENDENT_AMBULATORY_CARE_PROVIDER_SITE_OTHER): Payer: Self-pay | Admitting: Nurse Practitioner

## 2018-10-23 ENCOUNTER — Ambulatory Visit (INDEPENDENT_AMBULATORY_CARE_PROVIDER_SITE_OTHER): Payer: Medicare Other | Admitting: Nurse Practitioner

## 2018-10-23 ENCOUNTER — Other Ambulatory Visit: Payer: Self-pay

## 2018-10-23 VITALS — BP 190/98 | HR 88 | Resp 16 | Ht 62.0 in | Wt 118.0 lb

## 2018-10-23 DIAGNOSIS — L03115 Cellulitis of right lower limb: Secondary | ICD-10-CM | POA: Diagnosis not present

## 2018-10-23 DIAGNOSIS — Z79899 Other long term (current) drug therapy: Secondary | ICD-10-CM

## 2018-10-23 DIAGNOSIS — I7025 Atherosclerosis of native arteries of other extremities with ulceration: Secondary | ICD-10-CM

## 2018-10-23 DIAGNOSIS — L97519 Non-pressure chronic ulcer of other part of right foot with unspecified severity: Secondary | ICD-10-CM

## 2018-10-23 DIAGNOSIS — I10 Essential (primary) hypertension: Secondary | ICD-10-CM

## 2018-10-23 DIAGNOSIS — I70235 Atherosclerosis of native arteries of right leg with ulceration of other part of foot: Secondary | ICD-10-CM

## 2018-10-23 MED ORDER — DOXYCYCLINE HYCLATE 100 MG PO CAPS: 100.0000 mg | ORAL_CAPSULE | Freq: Two times a day (BID) | ORAL | 0 refills | Status: DC

## 2018-10-29 ENCOUNTER — Encounter (INDEPENDENT_AMBULATORY_CARE_PROVIDER_SITE_OTHER): Payer: Self-pay | Admitting: Nurse Practitioner

## 2018-10-29 DIAGNOSIS — L0390 Cellulitis, unspecified: Secondary | ICD-10-CM | POA: Insufficient documentation

## 2018-10-29 NOTE — Progress Notes (Signed)
SUBJECTIVE:  Patient ID: Sandy Roberson, female    DOB: 04/08/1916, 85 y.o.   MRN: [id number] Chief Complaint  Patient presents with  . Follow-up    RLE swelling open wound    HPI  Sandy Roberson is a 85 y.o. female that presents to day with her son due to concerns about a wound on her right lower extremity.  This wound has been present for about eight months or so despite treatment efforts.  Today, the foot is red and swollen compared to the left.  Patient was previously seen on 03/28/2018 and her ABIS were reduced with markedly diminished ABIs.  An angiogram was recommended at that time but alternatively with close follow up every 1-2 months.    The patient denies any complaints of rest pain and walks very little.  The redness and swelling just occurred several days ago.  The son denies any fever, chills, nausea or vomiting.     Past Medical History:  Diagnosis Date  . Coronary artery disease   . Hypertension   . Renal disorder     Past Surgical History:  Procedure Laterality Date  . CORONARY STENT PLACEMENT    . ESOPHAGOGASTRODUODENOSCOPY (EGD) WITH PROPOFOL N/A 10/20/2018   Procedure: ESOPHAGOGASTRODUODENOSCOPY (EGD) WITH PROPOFOL;  Surgeon: Lucilla Lame, MD;  Location: [facility] ENDOSCOPY;  Service: Endoscopy;  Laterality: N/A;  . FRACTURE SURGERY    . STENT PLACEMENT RT URETER ([place] HX)     since been removed     Social History   Socioeconomic History  . Marital status: Widowed    Spouse name: Not on file  . Number of children: Not on file  . Years of education: Not on file  . Highest education level: Not on file  Occupational History  . Not on file  Social Needs  . Financial resource strain: Not on file  . Food insecurity    Worry: Not on file    Inability: Not on file  . Transportation needs    Medical: Not on file    Non-medical: Not on file  Tobacco Use  . Smoking status: Never Smoker  . Smokeless tobacco: Never Used  Substance and Sexual Activity   . Alcohol use: No  . Drug use: Not on file  . Sexual activity: Not on file  Lifestyle  . Physical activity    Days per week: Not on file    Minutes per session: Not on file  . Stress: Not on file  Relationships  . Social Herbalist on phone: Not on file    Gets together: Not on file    Attends religious service: Not on file    Active member of club or organization: Not on file    Attends meetings of clubs or organizations: Not on file    Relationship status: Not on file  . Intimate partner violence    Fear of current or ex partner: Not on file    Emotionally abused: Not on file    Physically abused: Not on file    Forced sexual activity: Not on file  Other Topics Concern  . Not on file  Social History Narrative  . Not on file    Family History  Problem Relation Age of Onset  . Heart failure Neg Hx     Allergies  Allergen Reactions  . Penicillins Rash     Review of Systems   Review of Systems: Negative Unless Checked Constitutional: [] Weight loss  [] Fever  [] Chills  Cardiac: [] Chest pain   []  Atrial Fibrillation  [] Palpitations   [] Shortness of breath when laying flat   [] Shortness of breath with exertion. [] Shortness of breath at rest Vascular:  [] Pain in legs with walking   [] Pain in legs with standing [] Pain in legs when laying flat   [] Claudication    [] Pain in feet when laying flat    [] History of DVT   [] Phlebitis   [x] Swelling in legs   [] Varicose veins   [] Non-healing ulcers Pulmonary:   [] Uses home oxygen   [] Productive cough   [] Hemoptysis   [] Wheeze  [] COPD   [] Asthma Neurologic:  [] Dizziness   [] Seizures  [] Blackouts [] History of stroke   [] History of TIA  [] Aphasia   [] Temporary Blindness   [] Weakness or numbness in arm   [x] Weakness or numbness in leg Musculoskeletal:   [] Joint swelling   [] Joint pain   [] Low back pain  []  History of Knee Replacement [] Arthritis [] back Surgeries  []  Spinal Stenosis    Hematologic:  [] Easy bruising  [] Easy bleeding    [] Hypercoagulable state   [x] Anemic Gastrointestinal:  [] Diarrhea   [] Vomiting  [] Gastroesophageal reflux/heartburn   [] Difficulty swallowing. [] Abdominal pain Genitourinary:  [x] Chronic kidney disease   [] Difficult urination  [] Anuric   [] Blood in urine [] Frequent urination  [] Burning with urination   [] Hematuria Skin:  [] Rashes   [] Ulcers [] Wounds Psychological:  [] History of anxiety   []  History of major depression  []  Memory Difficulties      OBJECTIVE:   Physical Exam  BP (!) 190/98   Pulse 88   Resp 16   Ht 5\' 2"  (1.575 m)   Wt 118 lb (53.5 kg)   BMI 21.58 kg/m   Gen: WD/WN, NAD Head: [place]/AT, No temporalis wasting.  Ear/Nose/Throat: Hearing grossly intact, nares w/o erythema or drainage Eyes: PER, EOMI, sclera nonicteric.  Neck: Supple, no masses.  No JVD.  Pulmonary:  Good air movement, no use of accessory muscles.  Cardiac: RRR Vascular: small ulceration on right lateral portion of foot Vessel Right Left  Radial Palpable Palpable  Dorsalis Pedis Palpable Palpable  Posterior Tibial Palpable Palpable   Gastrointestinal: soft, non-distended. No guarding/no peritoneal signs.  Musculoskeletal: M/S 5/5 throughout.  No deformity or atrophy.  Neurologic: Pain and light touch intact in extremities.  Symmetrical.  Speech is fluent. Motor exam as listed above. Psychiatric: Judgment intact, Mood & affect appropriate for pt's clinical situation. Dermatologic: No Venous rashes. No Ulcers Noted. Right forefoot has s/s consistent with cellulitis Lymph : No Cervical lymphadenopathy, no lichenification or skin changes of chronic lymphedema.       ASSESSMENT AND PLAN:  1. Cellulitis of right lower extremity Right foot appears to have cellulitis and given her advanced age, prompt treatment is indicated.  Son advised to seek emergency care if the redness begins to travel further than the forefoot.  Patient will follow up in 1-2 weeks.  - doxycycline (VIBRAMYCIN) 100 MG capsule; Take  1 capsule (100 mg total) by mouth 2 (two) times daily.  Dispense: 20 capsule; Refill: 0  2. Atherosclerosis of native arteries of the extremities with ulceration ([place]) Son is concerned with wound healing delays, we will repeat ABIs and discuss possible treatment options in the setting of the patient's advanced age.   - VAS [place] ABI WITH/WO TBI; Future  3. Essential hypertension Continue antihypertensive medications as already ordered, these medications have been reviewed and there are no changes at this time.   Current Outpatient Medications on File Prior to Visit  Medication Sig Dispense Refill  . ASPIRIN 81 PO Take 81 mg by mouth daily.     . calcitRIOL (ROCALTROL) 0.25 MCG capsule Take 1 capsule by mouth daily. Daily at 1800    . carvedilol (COREG) 3.125 MG tablet Take 1 tablet (3.125 mg total) by mouth 2 (two) times daily with a meal. 60 tablet 1  . methimazole (TAPAZOLE) 5 MG tablet Take 1 tablet by mouth daily.    . nitrofurantoin (MACRODANTIN) 100 MG capsule Take 100 mg by mouth daily.    . traMADol (ULTRAM) 50 MG tablet Take 1 tablet (50 mg total) by mouth every 6 (six) hours as needed. 20 tablet 0   No current facility-administered medications on file prior to visit.     There are no Patient Instructions on file for this visit. Return in about 4 weeks (around 11/20/2018) for PAD.   Kris Hartmann, NP  This note was completed with Sales executive.  Any errors are purely unintentional.

## 2018-11-03 ENCOUNTER — Telehealth (INDEPENDENT_AMBULATORY_CARE_PROVIDER_SITE_OTHER): Payer: Self-pay

## 2018-11-03 NOTE — Telephone Encounter (Signed)
Patient son had called requesting for refill for Doxycycline for his mother since she took her last pill today and he stated there were no new changes.He stated that her wound culture results will be coming back today from another office and will see what they recommend.The patient does have appointment tomorrow with our office.

## 2018-11-04 ENCOUNTER — Ambulatory Visit (INDEPENDENT_AMBULATORY_CARE_PROVIDER_SITE_OTHER): Payer: Medicare Other | Admitting: Vascular Surgery

## 2018-11-04 ENCOUNTER — Encounter (INDEPENDENT_AMBULATORY_CARE_PROVIDER_SITE_OTHER): Payer: Medicare Other

## 2018-11-11 ENCOUNTER — Other Ambulatory Visit (INDEPENDENT_AMBULATORY_CARE_PROVIDER_SITE_OTHER): Payer: Self-pay | Admitting: Nurse Practitioner

## 2018-11-18 ENCOUNTER — Ambulatory Visit (INDEPENDENT_AMBULATORY_CARE_PROVIDER_SITE_OTHER): Payer: Medicare Other

## 2018-11-18 ENCOUNTER — Other Ambulatory Visit: Payer: Self-pay

## 2018-11-18 ENCOUNTER — Telehealth (INDEPENDENT_AMBULATORY_CARE_PROVIDER_SITE_OTHER): Payer: Self-pay

## 2018-11-18 ENCOUNTER — Other Ambulatory Visit (INDEPENDENT_AMBULATORY_CARE_PROVIDER_SITE_OTHER): Payer: Self-pay | Admitting: Nurse Practitioner

## 2018-11-18 ENCOUNTER — Ambulatory Visit (INDEPENDENT_AMBULATORY_CARE_PROVIDER_SITE_OTHER): Payer: Medicare Other | Admitting: Vascular Surgery

## 2018-11-18 ENCOUNTER — Encounter (INDEPENDENT_AMBULATORY_CARE_PROVIDER_SITE_OTHER): Payer: Self-pay | Admitting: Vascular Surgery

## 2018-11-18 VITALS — BP 160/81 | HR 73 | Resp 15

## 2018-11-18 DIAGNOSIS — N183 Chronic kidney disease, stage 3 unspecified: Secondary | ICD-10-CM

## 2018-11-18 DIAGNOSIS — E785 Hyperlipidemia, unspecified: Secondary | ICD-10-CM

## 2018-11-18 DIAGNOSIS — I10 Essential (primary) hypertension: Secondary | ICD-10-CM

## 2018-11-18 DIAGNOSIS — I2510 Atherosclerotic heart disease of native coronary artery without angina pectoris: Secondary | ICD-10-CM | POA: Diagnosis not present

## 2018-11-18 DIAGNOSIS — I7025 Atherosclerosis of native arteries of other extremities with ulceration: Secondary | ICD-10-CM

## 2018-11-18 NOTE — Telephone Encounter (Signed)
Spoke with the patients son and she is now scheduled for an angio on 11/24/2018 with Dr. Lucky Cowboy with a 11:00 am arrival time to the [facility]. Patient will do her Covid testing on 11/20/2018 between 12:30-2:30 pm at the [facility]. Pre-procedure instructions were discussed and will be mailed out.

## 2018-11-18 NOTE — Progress Notes (Signed)
MRN : [id number]  Sandy Roberson is a 85 y.o. (1916/02/20) female who presents with chief complaint of  Chief Complaint  Patient presents with  . Follow-up    est [name] for ABI  .  History of Present Illness: Patient returns today in follow up of her peripheral arterial disease and ulceration of the right foot/first toe and metatarsal head.  She has seen podiatry who is following the wound.  She is now unable to sleep due to the pain in the right foot.  She denies any trauma or injury to the area, but this longstanding wound has worsened over the past several weeks.  The past 2 to 3 weeks have been very miserable.  She is unable to really ambulate anymore.  Elevating her legs hurts worse.  No fever or chills.  Nothing is really made the pain bearable.  She has been taking tramadol but has run out.  That seemed to allow her to sleep slightly.  No current left leg symptoms.  We had discussed potential angiogram about 8 months ago but at that time her foot was not nearly as painful and given her advanced age and other issues they elected to wait on that.  There was no clear inciting event that made her symptoms deteriorate, but they have clearly deteriorated over the past several weeks.  We rechecked her blood flow today and her right ABI 0.62 with a digital pressure of 0.  Her left ABI 0.91 with a digital pressure of 79.  Current Outpatient Medications  Medication Sig Dispense Refill  . ASPIRIN 81 PO Take 81 mg by mouth daily.     . calcitRIOL (ROCALTROL) 0.25 MCG capsule Take 1 capsule by mouth daily. Daily at 1800    . carvedilol (COREG) 3.125 MG tablet Take 1 tablet (3.125 mg total) by mouth 2 (two) times daily with a meal. 60 tablet 1  . methimazole (TAPAZOLE) 5 MG tablet Take 1 tablet by mouth daily.    . nitrofurantoin (MACRODANTIN) 100 MG capsule Take 100 mg by mouth daily.    . traMADol (ULTRAM) 50 MG tablet Take 1 tablet (50 mg total) by mouth every 6 (six) hours as needed. 20  tablet 0  . doxycycline (VIBRAMYCIN) 100 MG capsule Take 1 capsule (100 mg total) by mouth 2 (two) times daily. (Patient not taking: Reported on 11/18/2018) 20 capsule 0   No current facility-administered medications for this visit.     Past Medical History:  Diagnosis Date  . Coronary artery disease   . Hypertension   . Renal disorder     Past Surgical History:  Procedure Laterality Date  . CORONARY STENT PLACEMENT    . ESOPHAGOGASTRODUODENOSCOPY (EGD) WITH PROPOFOL N/A 10/20/2018   Procedure: ESOPHAGOGASTRODUODENOSCOPY (EGD) WITH PROPOFOL;  Surgeon: Lucilla Lame, MD;  Location: [facility] ENDOSCOPY;  Service: Endoscopy;  Laterality: N/A;  . FRACTURE SURGERY    . STENT PLACEMENT RT URETER ([place] HX)     since been removed     Social History Social History   Tobacco Use  . Smoking status: Never Smoker  . Smokeless tobacco: Never Used  Substance Use Topics  . Alcohol use: No  . Drug use: Not on file   Family History  Problem Relation Age of Onset  . Heart failure Neg Hx   Notable for longevity.  She has 2 siblings still living well into their 18s. No bleeding disorders or clotting disorders.  No aneurysms.    Allergies  Allergen Reactions  .  Penicillins Rash    REVIEW OF SYSTEMS (Negative unless checked)  Constitutional: [] ?Weight loss  [] ?Fever  [] ?Chills Cardiac: [] ?Chest pain   [] ?Chest pressure   [] ?Palpitations   [] ?Shortness of breath when laying flat   [] ?Shortness of breath at rest   [] ?Shortness of breath with exertion. Vascular:  [] ?Pain in legs with walking   [] ?Pain in legs at rest   [] ?Pain in legs when laying flat   [] ?Claudication   [] ?Pain in feet when walking  [] ?Pain in feet at rest  [] ?Pain in feet when laying flat   [] ?History of DVT   [] ?Phlebitis   [] ?Swelling in legs   [] ?Varicose veins   [x] ?Non-healing ulcers Pulmonary:   [] ?Uses home oxygen   [] ?Productive cough   [] ?Hemoptysis   [] ?Wheeze  [] ?COPD   [] ?Asthma Neurologic:  [] ?Dizziness   [] ?Blackouts   [] ?Seizures   [] ?History of stroke   [] ?History of TIA  [] ?Aphasia   [] ?Temporary blindness   [] ?Dysphagia   [] ?Weakness or numbness in arms   [] ?Weakness or numbness in legs Musculoskeletal:  [x] ?Arthritis   [] ?Joint swelling   [] ?Joint pain   [] ?Low back pain Hematologic:  [] ?Easy bruising  [] ?Easy bleeding   [] ?Hypercoagulable state   [] ?Anemic  [] ?Hepatitis Gastrointestinal:  [] ?Blood in stool   [] ?Vomiting blood  [] ?Gastroesophageal reflux/heartburn   [] ?Abdominal pain Genitourinary:  [x] ?Chronic kidney disease   [] ?Difficult urination  [] ?Frequent urination  [] ?Burning with urination   [] ?Hematuria Skin:  [] ?Rashes   [x] ?Ulcers   [x] ?Wounds Psychological:  [] ?History of anxiety   [] ? History of major depression.  Physical Examination  BP (!) 160/81 (BP Location: Left Arm)   Pulse 73   Resp 15  Gen:  WD/WN, NAD. Appears younger than stated age. Head: [place]/AT, No temporalis wasting. Ear/Nose/Throat: Hearing diminished, nares w/o erythema or drainage Eyes: Conjunctiva clear. Sclera non-icteric Neck: Supple.  Trachea midline Pulmonary:  Good air movement, no use of accessory muscles.  Cardiac: somewhat irregular Vascular:  Vessel Right Left  Radial Palpable Palpable                          PT Not Palpable 1+ Palpable  DP Not Palpable Trace Palpable   Gastrointestinal: soft, non-tender/non-distended.  Musculoskeletal: M/S 5/5 throughout.  Patient using a wheelchair.  Ulcer on the right foot as described below.  Sluggish capillary refill with rubor present in all 5 toes on the right foot.  No significant edema in the left leg but there is 1-2+ edema in the right leg. Neurologic: Sensation grossly intact in extremities.  Symmetrical.  Speech is fluent.  Psychiatric: Judgment intact, Mood & affect appropriate for pt's clinical situation. Dermatologic: Pale, somewhat unhealthy wound on the medial aspect of the first digit/metatarsal phalangeal head.  Rubor is present in  all 5 toes.       Labs Recent Results (from the past 2160 hour(s))  CBC with Differential     Status: Abnormal   Collection Time: 10/18/18 10:26 PM  Result Value Ref Range   WBC 6.8 4.0 - 10.5 K/uL   RBC 3.51 (L) 3.87 - 5.11 MIL/uL   Hemoglobin 11.4 (L) 12.0 - 15.0 g/dL   HCT 34.6 (L) 36.0 - 46.0 %   MCV 98.6 80.0 - 100.0 fL   MCH 32.5 26.0 - 34.0 pg   MCHC 32.9 30.0 - 36.0 g/dL   RDW 14.1 11.5 - 15.5 %   Platelets 155 150 - 400 K/uL   nRBC 0.0 0.0 -  0.2 %   Neutrophils Relative % 77 %   Neutro Abs 5.2 1.7 - 7.7 K/uL   Lymphocytes Relative 13 %   Lymphs Abs 0.9 0.7 - 4.0 K/uL   Monocytes Relative 8 %   Monocytes Absolute 0.6 0.1 - 1.0 K/uL   Eosinophils Relative 1 %   Eosinophils Absolute 0.1 0.0 - 0.5 K/uL   Basophils Relative 0 %   Basophils Absolute 0.0 0.0 - 0.1 K/uL   Immature Granulocytes 1 %   Abs Immature Granulocytes 0.04 0.00 - 0.07 K/uL    Comment: Performed at [facility], [address] [place], [place] [address]  Comprehensive metabolic panel     Status: Abnormal   Collection Time: 10/18/18 10:26 PM  Result Value Ref Range   Sodium 138 135 - 145 mmol/L   Potassium 4.4 3.5 - 5.1 mmol/L   Chloride 102 98 - 111 mmol/L   CO2 26 22 - 32 mmol/L   Glucose, Bld 119 (H) 70 - 99 mg/dL   BUN 62 (H) 8 - 23 mg/dL   Creatinine, Ser 1.23 (H) 0.44 - 1.00 mg/dL   Calcium 9.9 8.9 - 10.3 mg/dL   Total Protein 7.6 6.5 - 8.1 g/dL   Albumin 4.0 3.5 - 5.0 g/dL   AST 23 15 - 41 U/L   ALT 21 0 - 44 U/L   Alkaline Phosphatase 47 38 - 126 U/L   Total Bilirubin 0.6 0.3 - 1.2 mg/dL   GFR calc non Af Amer 36 (L) >60 mL/min   GFR calc Af Amer 41 (L) >60 mL/min   Anion gap 10 5 - 15    Comment: Performed at [facility], [place], [place] [address]  Troponin I (High Sensitivity)     Status: Abnormal   Collection Time: 10/18/18 10:26 PM  Result Value Ref Range   Troponin I (High Sensitivity) 23 (H) <18 ng/L    Comment: (NOTE) Elevated  high sensitivity troponin I (hsTnI) values and significant  changes across serial measurements may suggest ACS but many other  chronic and acute conditions are known to elevate hsTnI results.  Refer to the "Links" section for chest pain algorithms and additional  guidance. Performed at [facility], [place], [place] [address]   Troponin I (High Sensitivity)     Status: Abnormal   Collection Time: 10/19/18 12:38 AM  Result Value Ref Range   Troponin I (High Sensitivity) 57 (H) <18 ng/L    Comment: READ BACK AND VERIFIED WITH LEA FURGURSON AT 0123 ON 10/19/18 RWW (NOTE) Elevated high sensitivity troponin I (hsTnI) values and significant  changes across serial measurements may suggest ACS but many other  chronic and acute conditions are known to elevate hsTnI results.  Refer to the "Links" section for chest pain algorithms and additional  guidance. Performed at [facility], [address] [place], [place] [address]   SARS Coronavirus 2 (CEPHEID - Performed in [facility] hospital lab), Hosp Order     Status: None   Collection Time: 10/19/18  2:16 AM   Specimen: Nasopharyngeal Swab  Result Value Ref Range   SARS Coronavirus 2 NEGATIVE NEGATIVE    Comment: (NOTE) If result is NEGATIVE SARS-CoV-2 target nucleic acids are NOT DETECTED. The SARS-CoV-2 RNA is generally detectable in upper and lower  respiratory specimens during the acute phase of infection. The lowest  concentration of SARS-CoV-2 viral copies this assay can detect is 250  copies / mL.  A negative result does not preclude SARS-CoV-2 infection  and should not be used as the sole basis for treatment or other  patient management decisions.  A negative result may occur with  improper specimen collection / handling, submission of specimen other  than nasopharyngeal swab, presence of viral mutation(s) within the  areas targeted by this assay, and inadequate number of viral copies  (<250  copies / mL). A negative result must be combined with clinical  observations, patient history, and epidemiological information. If result is POSITIVE SARS-CoV-2 target nucleic acids are DETECTED. The SARS-CoV-2 RNA is generally detectable in upper and lower  respiratory specimens dur ing the acute phase of infection.  Positive  results are indicative of active infection with SARS-CoV-2.  Clinical  correlation with patient history and other diagnostic information is  necessary to determine patient infection status.  Positive results do  not rule out bacterial infection or co-infection with other viruses. If result is PRESUMPTIVE POSTIVE SARS-CoV-2 nucleic acids MAY BE PRESENT.   A presumptive positive result was obtained on the submitted specimen  and confirmed on repeat testing.  While 2019 novel coronavirus  (SARS-CoV-2) nucleic acids may be present in the submitted sample  additional confirmatory testing may be necessary for epidemiological  and / or clinical management purposes  to differentiate between  SARS-CoV-2 and other Sarbecovirus currently known to infect humans.  If clinically indicated additional testing with an alternate test  methodology [phone]) is advised. The SARS-CoV-2 RNA is generally  detectable in upper and lower respiratory sp ecimens during the acute  phase of infection. The expected result is Negative. Fact Sheet for Patients:  StrictlyIdeas.no Fact Sheet for Healthcare Providers: BankingDealers.co.za This test is not yet approved or cleared by the [place] FDA and has been authorized for detection and/or diagnosis of SARS-CoV-2 by FDA under an Emergency Use Authorization (EUA).  This EUA will remain in effect (meaning this test can be used) for the duration of the COVID-19 declaration under Section 564(b)(1) of the Act, 21 U.S.C. section 360bbb-3(b)(1), unless the authorization is terminated or revoked  sooner. Performed at [facility], [address] [place], [place] [address]   Basic metabolic panel     Status: Abnormal   Collection Time: 10/19/18  5:13 AM  Result Value Ref Range   Sodium 140 135 - 145 mmol/L   Potassium 4.6 3.5 - 5.1 mmol/L   Chloride 106 98 - 111 mmol/L   CO2 27 22 - 32 mmol/L   Glucose, Bld 111 (H) 70 - 99 mg/dL   BUN 52 (H) 8 - 23 mg/dL   Creatinine, Ser 1.08 (H) 0.44 - 1.00 mg/dL   Calcium 9.3 8.9 - 10.3 mg/dL   GFR calc non Af Amer 42 (L) >60 mL/min   GFR calc Af Amer 48 (L) >60 mL/min   Anion gap 7 5 - 15    Comment: Performed at [facility], [address] [place], [place] [address]  CBC     Status: Abnormal   Collection Time: 10/19/18  5:13 AM  Result Value Ref Range   WBC 9.5 4.0 - 10.5 K/uL   RBC 3.29 (L) 3.87 - 5.11 MIL/uL   Hemoglobin 10.6 (L) 12.0 - 15.0 g/dL   HCT 32.0 (L) 36.0 - 46.0 %   MCV 97.3 80.0 - 100.0 fL   MCH 32.2 26.0 - 34.0 pg   MCHC 33.1 30.0 - 36.0 g/dL   RDW 13.8 11.5 - 15.5 %   Platelets  147 (L) 150 - 400 K/uL   nRBC 0.0 0.0 - 0.2 %    Comment: Performed at [facility], [address] [place], [place] [address]  Protime-INR     Status: None   Collection Time: 10/19/18  5:13 AM  Result Value Ref Range   Prothrombin Time 13.1 11.4 - 15.2 seconds   INR 1.0 0.8 - 1.2    Comment: (NOTE) INR goal varies based on device and disease states. Performed at [facility], [address] [place], [place] [address]   Troponin I (High Sensitivity)     Status: Abnormal   Collection Time: 10/19/18  5:13 AM  Result Value Ref Range   Troponin I (High Sensitivity) 210 (HH) <18 ng/L    Comment: CRITICAL RESULT CALLED TO, READ BACK BY AND VERIFIED WITH JACK DOUGHERTY @0726  10/19/18 MJU (NOTE) Elevated high sensitivity troponin I (hsTnI) values and significant  changes across serial measurements may suggest ACS but many other  chronic and acute conditions are known to elevate hsTnI results.   Refer to the "Links" section for chest pain algorithms and additional  guidance. Performed at [facility], [address] [place], [place] [address]   Troponin I (High Sensitivity)     Status: Abnormal   Collection Time: 10/19/18  7:07 AM  Result Value Ref Range   Troponin I (High Sensitivity) 238 (HH) <18 ng/L    Comment: CRITICAL VALUE NOTED. VALUE IS CONSISTENT WITH PREVIOUSLY REPORTED/CALLED VALUE MJU (NOTE) Elevated high sensitivity troponin I (hsTnI) values and significant  changes across serial measurements may suggest ACS but many other  chronic and acute conditions are known to elevate hsTnI results.  Refer to the "Links" section for chest pain algorithms and additional  guidance. Performed at [facility], [address] [place], [place] [address]     Radiology No results found.  Assessment/Plan CKD (chronic kidney disease) stage 3, GFR 30-59 ml/min (HCC) This could be an issue with angiogram.  We will try to limit contrast when angiogram is performed and hydrate her as needed.  Hypertension blood pressure control important in reducing the progression of atherosclerotic disease. On appropriate oral medications.   Hyperlipidemia lipid control important in reducing the progression of atherosclerotic disease. Continue statin therapy   Coronary artery disease Has had previous coronary intervention.  Angiogram would be low risk from a cardiac standpoint.  Atherosclerosis of native arteries of the extremities with ulceration ([place]) We rechecked her blood flow today and her right ABI 0.62 with a digital pressure of 0.  Her left ABI 0.91 with a digital pressure of 79. This is clearly a critical and limb threatening situation.  The situation is certainly complicated by her advanced age and comorbidities.  She is a very high risk patient, but without intervention or revascularization limb loss would be expected.  She would be very high risk for a  below-knee amputation although without revascularization amputation a digital level is unlikely to heal.  I discussed this difficult situation with the patient and her son at some length today.  I discussed the reason and rationale for angiography.  I discussed limb loss in the multiple levels of amputation that could be possible.  They voiced their understanding and are agreeable to proceed with an angiogram at this point.  This will be scheduled in the near future.    Leotis Pain, MD  11/18/2018 10:58 AM    This note was created with Dragon medical transcription system.  Any  errors from dictation are purely unintentional

## 2018-11-18 NOTE — Patient Instructions (Signed)
Endovascular Therapy for Peripheral Arterial Disease Peripheral arterial disease (PAD) is a condition in which the arteries that supply blood to the arms and legs (limbs) are too narrow. The narrowing is due to plaque buildup (atherosclerosis). Plaque is made up of fat, cholesterol, calcium, or other substances that can build up inside blood vessels. PAD can cause pain and numbness due to lack of blood flow, particularly in the legs. Endovascular therapy is a procedure to widen a narrowed blood vessel and improve blood flow. Endovascular means the procedure is done inside your artery, using a long, thin tube (catheter). The catheter is inserted into an incision in your leg and moved up your artery until it reaches the narrow part. A balloon or a small metal tube (stent) may be used to help widen the narrow artery and keep it open. Your health care provider may recommend endovascular therapy if lifestyle changes and medicines are not enough to improve your PAD. In some cases-such as when more than one artery is affected-you may need more than one procedure. Tell a health care provider about:  Any allergies you have.  All medicines you are taking, including vitamins, herbs, eye drops, creams, and over-the-counter medicines.  Any problems you or family members have had with anesthetic medicines.  Any blood disorders you have.  Any surgeries you have had.  Any medical conditions you have.  Whether you are pregnant or may be pregnant. What are the risks? Generally, this is a safe procedure. However, problems may occur, including:  Infection.  Bleeding.  Allergic reactions to medicines, materials, or dyes.  Damage to other structures or organs.  Heart attack.  Stroke.  Blood clots.  Kidney problems.  Nerve damage.  The stent moving out of place, becoming blocked, or not working.  Loss of your affected arm or leg. What happens before the procedure? Medicines  Ask your health  care provider about: ? Changing or stopping your regular medicines. This is especially important if you are taking diabetes medicines or blood thinners. ? Taking medicines such as aspirin and ibuprofen. These medicines can thin your blood. Do not take these medicines unless your health care provider tells you to take them. ? Taking over-the-counter medicines, vitamins, herbs, and supplements.  You may be given antibiotic medicine to help prevent infection. General instructions  Do not use any products that contain nicotine or tobacco, such as cigarettes and e-cigarettes. If you need help quitting, ask your health care provider.  Follow instructions from your health care provider about eating or drinking restrictions.  You will have blood tests and a physical exam. You may have other tests, such as: ? Ankle-brachial index (ABI). This test compares blood pressure in your ankle and arm. This can indicate narrowing or blockage in your leg arteries. ? Doppler ultrasound. This test uses sound waves to check blood flow. ? CT scan. This test uses dye to check blood flow and blockages in your leg arteries. ? MRI. ? Electrocardiogram (ECG) to check the electrical patterns and rhythms of the heart.  You may be asked to shower with a germ-killing soap.  Ask your health care provider how your surgical site will be marked or identified.  Plan to have someone take you home from the hospital. What happens during the procedure?   To lower your risk of infection: ? Your health care team will wash or sanitize their hands. ? Hair may be removed from the surgical area. ? Your skin will be washed with soap.  An IV will be inserted into one of your veins.  You will be given one or more of the following: ? A medicine to help you relax (sedative). ? A medicine to numb the area for the procedure (local anesthetic).  A puncture will be made in your upper thigh area, in the femoral artery or the iliac  artery. Rarely, a puncture may be made in the ankle area. A small incision may be made instead of a puncture.  A small wire will be inserted into the artery and moved through the artery.  Dye will be injected into your artery, and X-rays will be used to help identify where the blockage is. The dye helps to make the blood flow visible on X-rays.  A catheter will be inserted into the same spot as the small wire. It will be moved up the artery to reach the blocked or narrow part.  A small, deflated balloon will be inserted over the wire and into the catheter. It will be moved up the artery to reach the blocked or narrow part.  The small balloon will be filled with air (inflated) to widen the narrow part of the artery.  The balloon will be deflated.  A stent may be placed in the widened part of the artery to keep the artery open.  The wire and catheter will be removed.  A small catheter (urinary catheter) may be placed to drain urine from your bladder. This catheter may be used temporarily to drain urine during or after the procedure.  Your puncture or incision may be closed with a stitch (suture) or skin glue.  Your puncture or incision may be covered with a bandage (dressing). The procedure may vary among health care providers and hospitals. What happens after the procedure?  Your blood pressure, heart rate, breathing rate, and blood oxygen level will be monitored until the medicines you were given have worn off.  You will need to stay in bed as directed.  You may continue to have a catheter draining your urine.  You will be encouraged to drink fluids to wash (flush) the dye out of your body.  You will be given pain medicine as needed.  If you were given a sedative, do not drive for 24 hours or until your health care provider says that driving is safe for you. Summary  Endovascular therapy is a procedure to widen a narrowed blood vessel and improve blood flow.  This procedure  may be recommended if lifestyle changes and medicines are not enough to improve your peripheral arterial disease (PAD).  After the procedure, you will need to stay in bed and you will be encouraged to drink fluids to wash (flush) dye out of your body. This information is not intended to replace advice given to you by your health care provider. Make sure you discuss any questions you have with your health care provider. Document Released: 07/26/2016 Document Revised: 03/28/2018 Document Reviewed: 07/26/2016 Elsevier Patient Education  2020 [employer].

## 2018-11-18 NOTE — Assessment & Plan Note (Signed)
We rechecked her blood flow today and her right ABI 0.62 with a digital pressure of 0.  Her left ABI 0.91 with a digital pressure of 79. This is clearly a critical and limb threatening situation.  The situation is certainly complicated by her advanced age and comorbidities.  She is a very high risk patient, but without intervention or revascularization limb loss would be expected.  She would be very high risk for a below-knee amputation although without revascularization amputation a digital level is unlikely to heal.  I discussed this difficult situation with the patient and her son at some length today.  I discussed the reason and rationale for angiography.  I discussed limb loss in the multiple levels of amputation that could be possible.  They voiced their understanding and are agreeable to proceed with an angiogram at this point.  This will be scheduled in the near future.

## 2018-11-20 ENCOUNTER — Other Ambulatory Visit
Admission: RE | Admit: 2018-11-20 | Discharge: 2018-11-20 | Disposition: A | Discharge status: Home / Self Care | Payer: Medicare Other | Source: Ambulatory Visit | Attending: Vascular Surgery | Admitting: Vascular Surgery

## 2018-11-20 ENCOUNTER — Other Ambulatory Visit: Payer: Self-pay

## 2018-11-20 DIAGNOSIS — Z01812 Encounter for preprocedural laboratory examination: Secondary | ICD-10-CM | POA: Diagnosis present

## 2018-11-20 DIAGNOSIS — Z20828 Contact with and (suspected) exposure to other viral communicable diseases: Secondary | ICD-10-CM | POA: Diagnosis not present

## 2018-11-21 LAB — SARS CORONAVIRUS 2 (TAT 6-24 HRS): SARS Coronavirus 2: NEGATIVE

## 2018-11-23 MED ORDER — CLINDAMYCIN PHOSPHATE 300 MG/50ML IV SOLN
300.0000 mg | Freq: Once | INTRAVENOUS | Status: AC
  Administered 2018-11-24: 300 mg via INTRAVENOUS

## 2018-11-24 ENCOUNTER — Encounter
Admission: RE | Disposition: A | Discharge status: Home / Self Care | Payer: Self-pay | Source: Home / Self Care | Attending: Vascular Surgery

## 2018-11-24 ENCOUNTER — Other Ambulatory Visit: Payer: Self-pay

## 2018-11-24 ENCOUNTER — Ambulatory Visit
Admission: RE | Admit: 2018-11-24 | Discharge: 2018-11-24 | Disposition: A | Discharge status: Home / Self Care | Payer: Medicare Other | Attending: Vascular Surgery | Admitting: Vascular Surgery

## 2018-11-24 DIAGNOSIS — Z79899 Other long term (current) drug therapy: Secondary | ICD-10-CM | POA: Insufficient documentation

## 2018-11-24 DIAGNOSIS — I129 Hypertensive chronic kidney disease with stage 1 through stage 4 chronic kidney disease, or unspecified chronic kidney disease: Secondary | ICD-10-CM | POA: Insufficient documentation

## 2018-11-24 DIAGNOSIS — N183 Chronic kidney disease, stage 3 (moderate): Secondary | ICD-10-CM | POA: Diagnosis not present

## 2018-11-24 DIAGNOSIS — I70234 Atherosclerosis of native arteries of right leg with ulceration of heel and midfoot: Secondary | ICD-10-CM

## 2018-11-24 DIAGNOSIS — E785 Hyperlipidemia, unspecified: Secondary | ICD-10-CM | POA: Insufficient documentation

## 2018-11-24 DIAGNOSIS — Z7982 Long term (current) use of aspirin: Secondary | ICD-10-CM | POA: Diagnosis not present

## 2018-11-24 DIAGNOSIS — I70235 Atherosclerosis of native arteries of right leg with ulceration of other part of foot: Secondary | ICD-10-CM | POA: Insufficient documentation

## 2018-11-24 DIAGNOSIS — I2510 Atherosclerotic heart disease of native coronary artery without angina pectoris: Secondary | ICD-10-CM | POA: Insufficient documentation

## 2018-11-24 DIAGNOSIS — I70299 Other atherosclerosis of native arteries of extremities, unspecified extremity: Secondary | ICD-10-CM

## 2018-11-24 DIAGNOSIS — L97519 Non-pressure chronic ulcer of other part of right foot with unspecified severity: Secondary | ICD-10-CM | POA: Diagnosis not present

## 2018-11-24 DIAGNOSIS — L97909 Non-pressure chronic ulcer of unspecified part of unspecified lower leg with unspecified severity: Secondary | ICD-10-CM

## 2018-11-24 HISTORY — PX: LOWER EXTREMITY ANGIOGRAPHY: CATH118251

## 2018-11-24 LAB — CREATININE, SERUM
Creatinine, Ser: 1.14 mg/dL — ABNORMAL HIGH (ref 0.44–1.00)
GFR calc Af Amer: 45 mL/min — ABNORMAL LOW (ref >60)
GFR calc non Af Amer: 39 mL/min — ABNORMAL LOW (ref >60)

## 2018-11-24 LAB — BUN: BUN: 45 mg/dL — ABNORMAL HIGH (ref 8–23)

## 2018-11-24 SURGERY — LOWER EXTREMITY ANGIOGRAPHY
Anesthesia: Moderate Sedation | Site: Leg Lower | Laterality: Right

## 2018-11-24 MED ORDER — ONDANSETRON HCL 4 MG/2ML IJ SOLN: 4.0000 mg | Freq: Four times a day (QID) | INTRAMUSCULAR | Status: DC | PRN

## 2018-11-24 MED ORDER — FENTANYL CITRATE (PF) 100 MCG/2ML IJ SOLN
INTRAMUSCULAR | Status: AC
  Filled 2018-11-24: qty 2

## 2018-11-24 MED ORDER — HYDRALAZINE HCL 20 MG/ML IJ SOLN: 5.0000 mg | INTRAMUSCULAR | Status: DC | PRN

## 2018-11-24 MED ORDER — HEPARIN SODIUM (PORCINE) 1000 UNIT/ML IJ SOLN
INTRAMUSCULAR | Status: AC
  Filled 2018-11-24: qty 1

## 2018-11-24 MED ORDER — CLOPIDOGREL BISULFATE 75 MG PO TABS: 75.0000 mg | ORAL_TABLET | Freq: Every day | ORAL | 11 refills | Status: DC

## 2018-11-24 MED ORDER — MIDAZOLAM HCL 2 MG/2ML IJ SOLN
INTRAMUSCULAR | Status: DC | PRN
  Administered 2018-11-24: 0.5 mg via INTRAVENOUS
  Administered 2018-11-24: 1 mg via INTRAVENOUS
  Administered 2018-11-24: 0.5 mg via INTRAVENOUS

## 2018-11-24 MED ORDER — HEPARIN SODIUM (PORCINE) 1000 UNIT/ML IJ SOLN
INTRAMUSCULAR | Status: DC | PRN
  Administered 2018-11-24: 4000 [IU] via INTRAVENOUS

## 2018-11-24 MED ORDER — ATORVASTATIN CALCIUM 10 MG PO TABS: 10.0000 mg | ORAL_TABLET | Freq: Every day | ORAL | Status: DC

## 2018-11-24 MED ORDER — MIDAZOLAM HCL 5 MG/5ML IJ SOLN
INTRAMUSCULAR | Status: AC
  Filled 2018-11-24: qty 5

## 2018-11-24 MED ORDER — SODIUM CHLORIDE 0.9% FLUSH: 3.0000 mL | INTRAVENOUS | Status: DC | PRN

## 2018-11-24 MED ORDER — CLOPIDOGREL BISULFATE 75 MG PO TABS: 75.0000 mg | ORAL_TABLET | Freq: Every day | ORAL | Status: DC

## 2018-11-24 MED ORDER — CLINDAMYCIN PHOSPHATE 300 MG/50ML IV SOLN
INTRAVENOUS | Status: AC
  Administered 2018-11-24: 300 mg via INTRAVENOUS
  Filled 2018-11-24: qty 50

## 2018-11-24 MED ORDER — ATORVASTATIN CALCIUM 10 MG PO TABS: 10.0000 mg | ORAL_TABLET | Freq: Every day | ORAL | 11 refills | Status: DC

## 2018-11-24 MED ORDER — SODIUM CHLORIDE 0.9 % IV SOLN
INTRAVENOUS | Status: DC
  Administered 2018-11-24: 13:00:00 via INTRAVENOUS

## 2018-11-24 MED ORDER — MIDAZOLAM HCL 2 MG/ML PO SYRP: 8.0000 mg | ORAL_SOLUTION | Freq: Once | ORAL | Status: DC | PRN

## 2018-11-24 MED ORDER — FENTANYL CITRATE (PF) 100 MCG/2ML IJ SOLN
INTRAMUSCULAR | Status: DC | PRN
  Administered 2018-11-24 (×2): 12.5 ug via INTRAVENOUS
  Administered 2018-11-24: 25 ug via INTRAVENOUS

## 2018-11-24 MED ORDER — DIPHENHYDRAMINE HCL 50 MG/ML IJ SOLN: 50.0000 mg | Freq: Once | INTRAMUSCULAR | Status: DC | PRN

## 2018-11-24 MED ORDER — ACETAMINOPHEN 325 MG PO TABS: 650.0000 mg | ORAL_TABLET | ORAL | Status: DC | PRN

## 2018-11-24 MED ORDER — SODIUM CHLORIDE 0.9 % IV SOLN
INTRAVENOUS | Status: DC
  Administered 2018-11-24: 12:00:00 via INTRAVENOUS

## 2018-11-24 MED ORDER — IODIXANOL 320 MG/ML IV SOLN
INTRAVENOUS | Status: DC | PRN
  Administered 2018-11-24: 85 mL via INTRA_ARTERIAL

## 2018-11-24 MED ORDER — METHYLPREDNISOLONE SODIUM SUCC 125 MG IJ SOLR: 125.0000 mg | Freq: Once | INTRAMUSCULAR | Status: DC | PRN

## 2018-11-24 MED ORDER — SODIUM CHLORIDE 0.9 % IV SOLN: 250.0000 mL | INTRAVENOUS | Status: DC | PRN

## 2018-11-24 MED ORDER — FAMOTIDINE 20 MG PO TABS: 40.0000 mg | ORAL_TABLET | Freq: Once | ORAL | Status: DC | PRN

## 2018-11-24 MED ORDER — LABETALOL HCL 5 MG/ML IV SOLN: 10.0000 mg | INTRAVENOUS | Status: DC | PRN

## 2018-11-24 MED ORDER — SODIUM CHLORIDE 0.9% FLUSH: 3.0000 mL | Freq: Two times a day (BID) | INTRAVENOUS | Status: DC

## 2018-11-24 MED ORDER — HYDROMORPHONE HCL 1 MG/ML IJ SOLN: 1.0000 mg | Freq: Once | INTRAMUSCULAR | Status: DC | PRN

## 2018-11-24 SURGICAL SUPPLY — 19 items
BALLN LUTONIX 018 5X300X130 (BALLOONS) ×3
BALLN ULTRVRSE 2.5X300X150 (BALLOONS) ×3
BALLN ULTRVRSE 2X150X150 (BALLOONS) ×2
BALLN ULTRVRSE 2X150X150 OTW (BALLOONS) ×1
BALLOON LUTONIX 018 5X300X130 (BALLOONS) ×1 IMPLANT
BALLOON ULTRVRSE 2.5X300X150 (BALLOONS) ×1 IMPLANT
BALLOON ULTRVRSE 2X150X150 OTW (BALLOONS) ×1 IMPLANT
CATH BEACON 5 .035 65 RIM TIP (CATHETERS) ×3 IMPLANT
CATH BEACON 5 .038 100 VERT TP (CATHETERS) ×3 IMPLANT
CATH PIG 70CM (CATHETERS) ×3 IMPLANT
DEVICE PRESTO INFLATION (MISCELLANEOUS) ×3 IMPLANT
DEVICE SAFEGUARD 24CM (GAUZE/BANDAGES/DRESSINGS) ×3 IMPLANT
DEVICE STARCLOSE SE CLOSURE (Vascular Products) ×3 IMPLANT
GLIDEWIRE ADV .035X260CM (WIRE) ×3 IMPLANT
PACK ANGIOGRAPHY (CUSTOM PROCEDURE TRAY) ×3 IMPLANT
SHEATH ANL2 6FRX45 HC (SHEATH) ×3 IMPLANT
SHEATH BRITE TIP 5FRX11 (SHEATH) ×3 IMPLANT
WIRE G V18X300CM (WIRE) ×3 IMPLANT
WIRE J 3MM .035X145CM (WIRE) ×3 IMPLANT

## 2018-11-24 NOTE — Discharge Instructions (Signed)

## 2018-11-24 NOTE — H&P (Signed)
[facility] History & Physical Update  The patient was interviewed and re-examined.  The patient's previous History and Physical has been reviewed and is unchanged.  There is no change in the plan of care. We plan to proceed with the scheduled procedure.  Leotis Pain, MD  11/24/2018, 11:16 AM

## 2018-11-24 NOTE — Op Note (Signed)
[facility]  Percutaneous Study/Intervention Procedural Note   Date of Surgery: 11/24/2018  Surgeon(s):Algie Cales    Assistants:none  Pre-operative Diagnosis: PAD with ulceration right lower extremity  Post-operative diagnosis:  Same  Procedure(s) Performed:             1.  Ultrasound guidance for vascular access left femoral artery             2.  Catheter placement into right common femoral artery from left femoral approach             3.  Aortogram and selective right lower extremity angiogram             4.  Percutaneous transluminal angioplasty of peroneal artery and tibioperoneal trunk with 2 mm diameter angioplasty balloon distally and 2-1/2 mm diameter angioplasty balloon from the mid peroneal artery up to the be a peroneal trunk             5.   Percutaneous transluminal angioplasty of the right superficial femoral artery with 5 mm diameter by 30 cm length Lutonix drug-coated angioplasty balloon  6.  StarClose closure device left femoral artery  EBL: 5 cc  Contrast: 85 cc  Fluoro Time: 7.7 minutes  Moderate Conscious Sedation Time: approximately 35 minutes using 2 mg of Versed and 50 Mcg of Fentanyl              Indications:  Patient is a 85 y.o.female with a nonhealing ulceration on the right foot and worsening rest pain. The patient has noninvasive study showing an undetectable digital pressure with reduction in her ABIs well. The patient is brought in for angiography for further evaluation and potential treatment.  Due to the limb threatening nature of the situation, angiogram was performed for attempted limb salvage. The patient is aware that if the procedure fails, amputation would be expected.  The patient also understands that even with successful revascularization, amputation may still be required due to the severity of the situation.  Risks and benefits are discussed and informed consent is obtained.   Procedure:  The patient was identified  and appropriate procedural time out was performed.  The patient was then placed supine on the table and prepped and draped in the usual sterile fashion. Moderate conscious sedation was administered during a face to face encounter with the patient throughout the procedure with my supervision of the RN administering medicines and monitoring the patient's vital signs, pulse oximetry, telemetry and mental status throughout from the start of the procedure until the patient was taken to the recovery room. Ultrasound was used to evaluate the left common femoral artery.  It was patent .  A digital ultrasound image was acquired.  A Seldinger needle was used to access the left common femoral artery under direct ultrasound guidance and a permanent image was performed.  A 0.035 J wire was advanced without resistance and a 5Fr sheath was placed.  Pigtail catheter was placed into the aorta and an AP aortogram was performed. This demonstrated mild renal artery stenosis that did not appear hemodynamically significant.  Aorta and iliac arteries without significant stenosis. I then crossed the aortic bifurcation and advanced to the right femoral head. Selective right lower extremity angiogram was then performed. This demonstrated significantly calcific disease with 3 areas of stenosis in the SFA.  The proximal SFA had about a 60% stenosis, the mid SFA had about a 70 to 75% stenosis, and Hunter's canal had about a 70% stenosis.  The  popliteal artery then normalized and there was severe tibial level disease.  The tibioperoneal trunk had close to 50% stenosis but the peroneal artery had multiple areas of greater than 80% stenosis throughout its course but was the best runoff distally.  The anterior tibial artery was chronically occluded just beyond its origin without distal reconstitution.  The posterior tibial artery was heavily diseased to the midsegment where it was occluded and distal reconstitution was not seen. It was felt that it  was in the patient's best interest to proceed with intervention after these images to avoid a second procedure and a larger amount of contrast and fluoroscopy based off of the findings from the initial angiogram. The patient was systemically heparinized and a 6 [place] Ansell sheath was then placed over the [employer] wire. I then used a Kumpe catheter and the advantage wire to navigate through the SFA lesions and get the wire down into the below-knee popliteal artery.  The Kumpe catheter was advanced and the advantage wire was removed.  I then took a V 18 wire and with the help of the Kumpe catheter got into the peroneal artery and across all the lesions down to the lateral dorsal artery just above the ankle.  A 2 mm diameter by 15 cm length angioplasty balloon was inflated to 8 atm for the distal peroneal artery and a 2.5 mm diameter by 30 cm length angioplasty balloon was inflated to 12 atm from the mid to distal peroneal artery up through the tibioperoneal trunk.  Completion imaging showed markedly improved flow with less than 30% residual stenosis in the peroneal vessel.  I then turned my attention to the multiple SFA lesions.  A single long 5 mm diameter by 30 cm length Lutonix drug-coated angioplasty balloon was used to address all 3 lesions.  This was inflated up to 8 atm for 1 minute.  Completion imaging showed significant improvement with about a 20% residual stenosis proximally, about a 30% residual stenosis in the midsegment, and about a 10 to 15% residual stenosis at Hunter's canal. I elected to terminate the procedure. The sheath was removed and StarClose closure device was deployed in the left femoral artery with excellent hemostatic result. The patient was taken to the recovery room in stable condition having tolerated the procedure well.  Findings:               Aortogram:  mild renal artery stenosis that did not appear hemodynamically significant.  Aorta and iliac arteries without  significant stenosis.             Right lower Extremity:  Significantly calcific disease with 3 areas of stenosis in the SFA.  The proximal SFA had about a 60% stenosis, the mid SFA had about a 70 to 75% stenosis, and Hunter's canal had about a 70% stenosis.  The popliteal artery then normalized and there was severe tibial level disease.  The tibioperoneal trunk had close to 50% stenosis but the peroneal artery had multiple areas of greater than 80% stenosis throughout its course but was the best runoff distally.  The anterior tibial artery was chronically occluded just beyond its origin without distal reconstitution.  The posterior tibial artery was heavily diseased to the midsegment where it was occluded and distal reconstitution was not seen.   Disposition: Patient was taken to the recovery room in stable condition having tolerated the procedure well.  Complications: None  Leotis Pain 11/24/2018 12:48 PM   This note was created with [facility]  transcription system. Any errors in dictation are purely unintentional.

## 2018-11-24 NOTE — Progress Notes (Signed)
Provided discharge instructions to Sandy Roberson, son. Sandy Roberson stated understanding and no further questions as this time.

## 2018-11-26 ENCOUNTER — Telehealth (INDEPENDENT_AMBULATORY_CARE_PROVIDER_SITE_OTHER): Payer: Self-pay | Admitting: Nurse Practitioner

## 2018-11-26 NOTE — Telephone Encounter (Signed)
Pt son calling to ask if he can remove bandage from angio procedure. Per Arna Medici this is fine and he can place a regular band-aid over top of puncture hole. I advised [name] of this and he verbalized understanding. AS, CMA

## 2018-12-15 ENCOUNTER — Telehealth (INDEPENDENT_AMBULATORY_CARE_PROVIDER_SITE_OTHER): Payer: Self-pay | Admitting: Nurse Practitioner

## 2018-12-15 NOTE — Telephone Encounter (Signed)
We cannot move up appointment sooner do to lack of availability.  He can stop atorvastatin but plavix needs to be continued after intervention to keep her vessels open and prevent restenosis.

## 2018-12-15 NOTE — Telephone Encounter (Signed)
Spoke with Sandy Roberson, he has been made aware of the below and verbalized understanding. AS, CMA

## 2018-12-15 NOTE — Telephone Encounter (Signed)
Patient son calling stating that his mother was put on medications Atorvastatin and Plavix. Patient son thinks its causing her to become disoriented and shaky. Would like her to stop on or both meds due to this. Also, patient has follow up apt with [facility] on 12/23/18. Asking if this could be moved up to sooner apt. Please advise. AS, CMA

## 2018-12-16 ENCOUNTER — Other Ambulatory Visit (INDEPENDENT_AMBULATORY_CARE_PROVIDER_SITE_OTHER): Payer: Self-pay | Admitting: Vascular Surgery

## 2018-12-16 DIAGNOSIS — Z9862 Peripheral vascular angioplasty status: Secondary | ICD-10-CM

## 2018-12-16 DIAGNOSIS — I70239 Atherosclerosis of native arteries of right leg with ulceration of unspecified site: Secondary | ICD-10-CM

## 2018-12-23 ENCOUNTER — Encounter (INDEPENDENT_AMBULATORY_CARE_PROVIDER_SITE_OTHER): Payer: Medicare Other

## 2018-12-23 ENCOUNTER — Ambulatory Visit (INDEPENDENT_AMBULATORY_CARE_PROVIDER_SITE_OTHER): Payer: Medicare Other | Admitting: Vascular Surgery

## 2019-01-02 ENCOUNTER — Ambulatory Visit (INDEPENDENT_AMBULATORY_CARE_PROVIDER_SITE_OTHER): Payer: Medicare Other

## 2019-01-02 ENCOUNTER — Encounter (INDEPENDENT_AMBULATORY_CARE_PROVIDER_SITE_OTHER): Payer: Self-pay | Admitting: Nurse Practitioner

## 2019-01-02 ENCOUNTER — Ambulatory Visit (INDEPENDENT_AMBULATORY_CARE_PROVIDER_SITE_OTHER): Payer: Medicare Other | Admitting: Nurse Practitioner

## 2019-01-02 ENCOUNTER — Other Ambulatory Visit: Payer: Self-pay

## 2019-01-02 ENCOUNTER — Other Ambulatory Visit
Admission: RE | Admit: 2019-01-02 | Discharge: 2019-01-02 | Disposition: A | Discharge status: Home / Self Care | Payer: Medicare Other | Source: Ambulatory Visit | Attending: Vascular Surgery | Admitting: Vascular Surgery

## 2019-01-02 ENCOUNTER — Telehealth (INDEPENDENT_AMBULATORY_CARE_PROVIDER_SITE_OTHER): Payer: Self-pay | Admitting: Nurse Practitioner

## 2019-01-02 ENCOUNTER — Encounter (INDEPENDENT_AMBULATORY_CARE_PROVIDER_SITE_OTHER): Payer: Self-pay

## 2019-01-02 VITALS — BP 144/84 | HR 76 | Resp 12 | Ht 63.0 in | Wt 118.0 lb

## 2019-01-02 DIAGNOSIS — I70239 Atherosclerosis of native arteries of right leg with ulceration of unspecified site: Secondary | ICD-10-CM

## 2019-01-02 DIAGNOSIS — I10 Essential (primary) hypertension: Secondary | ICD-10-CM

## 2019-01-02 DIAGNOSIS — I7025 Atherosclerosis of native arteries of other extremities with ulceration: Secondary | ICD-10-CM | POA: Diagnosis not present

## 2019-01-02 DIAGNOSIS — Z01812 Encounter for preprocedural laboratory examination: Secondary | ICD-10-CM | POA: Diagnosis not present

## 2019-01-02 DIAGNOSIS — Z20828 Contact with and (suspected) exposure to other viral communicable diseases: Secondary | ICD-10-CM | POA: Diagnosis not present

## 2019-01-02 DIAGNOSIS — I70269 Atherosclerosis of native arteries of extremities with gangrene, unspecified extremity: Secondary | ICD-10-CM

## 2019-01-02 DIAGNOSIS — Z9862 Peripheral vascular angioplasty status: Secondary | ICD-10-CM | POA: Diagnosis not present

## 2019-01-02 DIAGNOSIS — E785 Hyperlipidemia, unspecified: Secondary | ICD-10-CM

## 2019-01-02 NOTE — Telephone Encounter (Signed)
Patient is scheduled for RLE Angio on 01/05/19 at 1030am. Patient son is aware patient needs to be NPO for 8 hrs before the procedure. Patient can take all meds with small sips of water the morning of the procedure. Patient is going today to have COVID testing before 4pm to the [facility] across from the [facility]. Letter with instructions has been given to patient son and he verbalized understanding. AS, CMA

## 2019-01-02 NOTE — Progress Notes (Signed)
SUBJECTIVE:  Patient ID: Sandy Roberson, female    DOB: May 04, 1915, 85 y.o.   MRN: [id number] Chief Complaint  Patient presents with  . Follow-up    HPI  Sandy Roberson is a 85 y.o. female that presents today for follow-up of an angiogram done on 11/24/2018.  Previously, the patient had missed 2 scheduled appointments due to feeling poorly.  Today the patient presents due to the concern that her toe ulceration on her right lower extremity is actually getting worse.  She has also been followed by podiatry the patient has signs of necrosis however no signs of infection.  The wound is mainly located at the first dorsal medial joint.  The patient's son reports that the patient has pain when she elevates her lower extremities in the evening.  The patient does not walk very much therefore it is difficult to discern if the patient has worsening claudication symptoms.  Today the patient has an ABI 0.29 on her right lower extremity with a nondetectable TBI.  Her previous ABI was 0.62 but also with a 0.0 TBI.  The left lower extremity has an ABI 0.70 with a TBI 0.23.  Previous studies on 11/18/2018 had an ABI 0.91 with a TBI 0.44.  The patient has monophasic waveforms in her left tibial arteries.  The right lower extremity has triphasic waveforms to the level of the popliteal.  The anterior and posterior tibial arteries have dampened monophasic flow.  There is no flow detected by duplex, however there are collaterals.  This is consistent with the previous angiogram report on 18 2020.  Past Medical History:  Diagnosis Date  . Coronary artery disease   . Hypertension   . Renal disorder     Past Surgical History:  Procedure Laterality Date  . CORONARY STENT PLACEMENT    . ESOPHAGOGASTRODUODENOSCOPY (EGD) WITH PROPOFOL N/A 10/20/2018   Procedure: ESOPHAGOGASTRODUODENOSCOPY (EGD) WITH PROPOFOL;  Surgeon: Lucilla Lame, MD;  Location: [facility] END[facility];  Service: Endoscopy;  Laterality: N/A;  .  FRACTURE SURGERY    . LOWER EXTREMITY ANGIOGRAPHY Right 11/24/2018   Procedure: LOWER EXTREMITY ANGIOGRAPHY;  Surgeon: Algernon Huxley, MD;  Location: [facility];  Service: Cardiovascular;  Laterality: Right;  . STENT PLACEMENT RT URETER ([place] HX)     since been removed     Social History   Socioeconomic History  . Marital status: Widowed    Spouse name: Not on file  . Number of children: Not on file  . Years of education: Not on file  . Highest education level: Not on file  Occupational History  . Not on file  Social Needs  . Financial resource strain: Not on file  . Food insecurity    Worry: Not on file    Inability: Not on file  . Transportation needs    Medical: Not on file    Non-medical: Not on file  Tobacco Use  . Smoking status: Never Smoker  . Smokeless tobacco: Never Used  Substance and Sexual Activity  . Alcohol use: No  . Drug use: Not on file  . Sexual activity: Not Currently  Lifestyle  . Physical activity    Days per week: Not on file    Minutes per session: Not on file  . Stress: Not on file  Relationships  . Social Herbalist on phone: Not on file    Gets together: Not on file    Attends religious service: Not on file    Active  member of club or organization: Not on file    Attends meetings of clubs or organizations: Not on file    Relationship status: Not on file  . Intimate partner violence    Fear of current or ex partner: Not on file    Emotionally abused: Not on file    Physically abused: Not on file    Forced sexual activity: Not on file  Other Topics Concern  . Not on file  Social History Narrative  . Not on file    Family History  Problem Relation Age of Onset  . Heart failure Neg Hx     Allergies  Allergen Reactions  . Penicillins Rash     Review of Systems   Review of Systems: Negative Unless Checked Constitutional: [] Weight loss  [] Fever  [] Chills Cardiac: [] Chest pain   []  Atrial Fibrillation   [] Palpitations   [] Shortness of breath when laying flat   [] Shortness of breath with exertion. [] Shortness of breath at rest Vascular:  [] Pain in legs with walking   [] Pain in legs with standing [x] Pain in legs when laying flat   [x] Claudication    [x] Pain in feet when laying flat    [] History of DVT   [] Phlebitis   [] Swelling in legs   [x] Varicose veins   [] Non-healing ulcers Pulmonary:   [] Uses home oxygen   [] Productive cough   [] Hemoptysis   [] Wheeze  [] COPD   [] Asthma Neurologic:  [] Dizziness   [] Seizures  [] Blackouts [] History of stroke   [] History of TIA  [] Aphasia   [] Temporary Blindness   [] Weakness or numbness in arm   [] Weakness or numbness in leg Musculoskeletal:   [] Joint swelling   [] Joint pain   [] Low back pain  []  History of Knee Replacement [] Arthritis [] back Surgeries  []  Spinal Stenosis    Hematologic:  [] Easy bruising  [] Easy bleeding   [] Hypercoagulable state   [x] Anemic Gastrointestinal:  [] Diarrhea   [] Vomiting  [] Gastroesophageal reflux/heartburn   [] Difficulty swallowing. [] Abdominal pain Genitourinary:  [x] Chronic kidney disease   [] Difficult urination  [] Anuric   [] Blood in urine [] Frequent urination  [] Burning with urination   [] Hematuria Skin:  [] Rashes   [] Ulcers [] Wounds Psychological:  [] History of anxiety   []  History of major depression  []  Memory Difficulties      OBJECTIVE:   Physical Exam  BP (!) 144/84 (BP Location: Left Arm, Patient Position: Sitting, Cuff Size: Normal)   Pulse 76   Resp 12   Ht 5\' 3"  (1.6 m)   Wt 118 lb (53.5 kg)   BMI 20.90 kg/m   Gen: WD/WN, NAD Head: [place]/AT, No temporalis wasting.  Ear/Nose/Throat: Hearing grossly intact, nares w/o erythema or drainage Eyes: PER, EOMI, sclera nonicteric.  Neck: Supple, no masses.  No JVD.  Pulmonary:  Good air movement, no use of accessory muscles.  Cardiac: RRR Vascular:  Vessel Right Left  Radial Palpable Palpable  Dorsalis Pedis Not Palpable Not Palpable  Posterior Tibial Not Palpable  Not Palpable   Gastrointestinal: soft, non-distended. No guarding/no peritoneal signs.  Musculoskeletal: M/S 5/5 throughout.  No deformity or atrophy.  Neurologic: Pain and light touch intact in extremities.  Symmetrical.  Speech is fluent. Motor exam as listed above. Psychiatric: Judgment intact, Mood & affect appropriate for pt's clinical situation. Dermatologic: No Venous rashes. No Ulcers Noted.  No changes consistent with cellulitis. Lymph : No Cervical lymphadenopathy, no lichenification or skin changes of chronic lymphedema.       ASSESSMENT AND PLAN:  1. Atherosclerosis of native  arteries of the extremities with ulceration (HCC)  Recommend:  The patient has evidence of severe atherosclerotic changes of both lower extremities associated with ulceration and tissue loss of the foot.  This represents a limb threatening ischemia and places the patient at the risk for limb loss.  Patient should undergo angiography of the left  lower extremity with the hope for intervention for limb salvage.  The risks and benefits as well as the alternative therapies was discussed in detail with the patient.  All questions were answered.  Patient agrees to proceed with angiography.  The patient will follow up with me in the office after the procedure.    2. Hyperlipidemia, unspecified hyperlipidemia type Continue statin as ordered and reviewed, no changes at this time   3. Essential hypertension Continue antihypertensive medications as already ordered, these medications have been reviewed and there are no changes at this time.    Current Outpatient Medications on File Prior to Visit  Medication Sig Dispense Refill  . ALPRAZolam (XANAX) 0.25 MG tablet     . ASPIRIN 81 PO Take 81 mg by mouth daily.     . calcitRIOL (ROCALTROL) 0.25 MCG capsule Take 1 capsule by mouth daily. Daily at 1800    . carvedilol (COREG) 3.125 MG tablet Take 1 tablet (3.125 mg total) by mouth 2 (two) times daily with a  meal. 60 tablet 1  . clopidogrel (PLAVIX) 75 MG tablet Take 1 tablet (75 mg total) by mouth daily. 30 tablet 11  . methimazole (TAPAZOLE) 5 MG tablet Take 1 tablet by mouth daily.    Marland Kitchen SANTYL ointment     . traMADol (ULTRAM) 50 MG tablet Take by mouth.    . trimethoprim (TRIMPEX) 100 MG tablet Take by mouth.     No current facility-administered medications on file prior to visit.     There are no Patient Instructions on file for this visit. No follow-ups on file.   Kris Hartmann, NP  This note was completed with Sales executive.  Any errors are purely unintentional.

## 2019-01-03 LAB — SARS CORONAVIRUS 2 (TAT 6-24 HRS): SARS Coronavirus 2: NEGATIVE

## 2019-01-05 ENCOUNTER — Ambulatory Visit
Admission: RE | Admit: 2019-01-05 | Discharge: 2019-01-05 | Disposition: A | Discharge status: Home / Self Care | Payer: Medicare Other | Attending: Vascular Surgery | Admitting: Vascular Surgery

## 2019-01-05 ENCOUNTER — Other Ambulatory Visit: Payer: Self-pay

## 2019-01-05 ENCOUNTER — Encounter
Admission: RE | Disposition: A | Discharge status: Home / Self Care | Payer: Self-pay | Source: Home / Self Care | Attending: Vascular Surgery

## 2019-01-05 DIAGNOSIS — L97513 Non-pressure chronic ulcer of other part of right foot with necrosis of muscle: Secondary | ICD-10-CM | POA: Diagnosis not present

## 2019-01-05 DIAGNOSIS — Z7902 Long term (current) use of antithrombotics/antiplatelets: Secondary | ICD-10-CM | POA: Diagnosis not present

## 2019-01-05 DIAGNOSIS — I70269 Atherosclerosis of native arteries of extremities with gangrene, unspecified extremity: Secondary | ICD-10-CM

## 2019-01-05 DIAGNOSIS — I10 Essential (primary) hypertension: Secondary | ICD-10-CM | POA: Insufficient documentation

## 2019-01-05 DIAGNOSIS — Z7982 Long term (current) use of aspirin: Secondary | ICD-10-CM | POA: Diagnosis not present

## 2019-01-05 DIAGNOSIS — Z79899 Other long term (current) drug therapy: Secondary | ICD-10-CM | POA: Insufficient documentation

## 2019-01-05 DIAGNOSIS — I2510 Atherosclerotic heart disease of native coronary artery without angina pectoris: Secondary | ICD-10-CM | POA: Diagnosis not present

## 2019-01-05 DIAGNOSIS — I70261 Atherosclerosis of native arteries of extremities with gangrene, right leg: Secondary | ICD-10-CM | POA: Insufficient documentation

## 2019-01-05 HISTORY — PX: LOWER EXTREMITY ANGIOGRAPHY: CATH118251

## 2019-01-05 LAB — CREATININE, SERUM
Creatinine, Ser: 1.23 mg/dL — ABNORMAL HIGH (ref 0.44–1.00)
GFR calc Af Amer: 41 mL/min — ABNORMAL LOW (ref >60)
GFR calc non Af Amer: 36 mL/min — ABNORMAL LOW (ref >60)

## 2019-01-05 LAB — BUN: BUN: 35 mg/dL — ABNORMAL HIGH (ref 8–23)

## 2019-01-05 SURGERY — LOWER EXTREMITY ANGIOGRAPHY
Anesthesia: Moderate Sedation | Laterality: Right

## 2019-01-05 MED ORDER — FAMOTIDINE 20 MG PO TABS: 40.0000 mg | ORAL_TABLET | Freq: Once | ORAL | Status: DC | PRN

## 2019-01-05 MED ORDER — CLINDAMYCIN PHOSPHATE 300 MG/50ML IV SOLN
300.0000 mg | Freq: Once | INTRAVENOUS | Status: AC
  Administered 2019-01-05: 12:00:00 300 mg via INTRAVENOUS

## 2019-01-05 MED ORDER — HYDROMORPHONE HCL 1 MG/ML IJ SOLN: 1.0000 mg | Freq: Once | INTRAMUSCULAR | Status: DC | PRN

## 2019-01-05 MED ORDER — ATORVASTATIN CALCIUM 10 MG PO TABS: 10.0000 mg | ORAL_TABLET | Freq: Every day | ORAL | Status: DC

## 2019-01-05 MED ORDER — MIDAZOLAM HCL 2 MG/2ML IJ SOLN
INTRAMUSCULAR | Status: DC | PRN
  Administered 2019-01-05 (×2): 1 mg via INTRAVENOUS

## 2019-01-05 MED ORDER — SODIUM CHLORIDE 0.9 % IV SOLN
INTRAVENOUS | Status: DC
  Administered 2019-01-05: 11:00:00 1000 mL via INTRAVENOUS

## 2019-01-05 MED ORDER — DIPHENHYDRAMINE HCL 50 MG/ML IJ SOLN: 50.0000 mg | Freq: Once | INTRAMUSCULAR | Status: DC | PRN

## 2019-01-05 MED ORDER — SODIUM CHLORIDE 0.9 % IV SOLN: INTRAVENOUS | Status: DC

## 2019-01-05 MED ORDER — METHYLPREDNISOLONE SODIUM SUCC 125 MG IJ SOLR: 125.0000 mg | Freq: Once | INTRAMUSCULAR | Status: DC | PRN

## 2019-01-05 MED ORDER — LABETALOL HCL 5 MG/ML IV SOLN: 10.0000 mg | INTRAVENOUS | Status: DC | PRN

## 2019-01-05 MED ORDER — IODIXANOL 320 MG/ML IV SOLN
INTRAVENOUS | Status: DC | PRN
  Administered 2019-01-05: 85 mL via INTRA_ARTERIAL

## 2019-01-05 MED ORDER — HEPARIN SODIUM (PORCINE) 1000 UNIT/ML IJ SOLN
INTRAMUSCULAR | Status: DC | PRN
  Administered 2019-01-05: 4000 [IU] via INTRAVENOUS

## 2019-01-05 MED ORDER — HYDRALAZINE HCL 20 MG/ML IJ SOLN: 5.0000 mg | INTRAMUSCULAR | Status: DC | PRN

## 2019-01-05 MED ORDER — ACETAMINOPHEN 325 MG PO TABS: 650.0000 mg | ORAL_TABLET | ORAL | Status: DC | PRN

## 2019-01-05 MED ORDER — SODIUM CHLORIDE 0.9 % IV SOLN: 250.0000 mL | INTRAVENOUS | Status: DC | PRN

## 2019-01-05 MED ORDER — MIDAZOLAM HCL 2 MG/ML PO SYRP
8.0000 mg | ORAL_SOLUTION | Freq: Once | ORAL | Status: AC | PRN
  Administered 2019-01-05: 4 mg via ORAL

## 2019-01-05 MED ORDER — FENTANYL CITRATE (PF) 100 MCG/2ML IJ SOLN
INTRAMUSCULAR | Status: DC | PRN
  Administered 2019-01-05 (×2): 25 ug via INTRAVENOUS

## 2019-01-05 MED ORDER — ONDANSETRON HCL 4 MG/2ML IJ SOLN: 4.0000 mg | Freq: Four times a day (QID) | INTRAMUSCULAR | Status: DC | PRN

## 2019-01-05 MED ORDER — MIDAZOLAM HCL 5 MG/5ML IJ SOLN
INTRAMUSCULAR | Status: AC
  Filled 2019-01-05: qty 5

## 2019-01-05 MED ORDER — CLINDAMYCIN PHOSPHATE 300 MG/50ML IV SOLN
INTRAVENOUS | Status: AC
  Administered 2019-01-05: 12:00:00 300 mg via INTRAVENOUS
  Filled 2019-01-05: qty 50

## 2019-01-05 MED ORDER — MIDAZOLAM HCL 2 MG/ML PO SYRP
ORAL_SOLUTION | ORAL | Status: AC
  Filled 2019-01-05: qty 4

## 2019-01-05 MED ORDER — ASPIRIN EC 81 MG PO TBEC
DELAYED_RELEASE_TABLET | ORAL | Status: AC
  Filled 2019-01-05: qty 1

## 2019-01-05 MED ORDER — SODIUM CHLORIDE 0.9% FLUSH: 3.0000 mL | Freq: Two times a day (BID) | INTRAVENOUS | Status: DC

## 2019-01-05 MED ORDER — FENTANYL CITRATE (PF) 100 MCG/2ML IJ SOLN
INTRAMUSCULAR | Status: AC
  Filled 2019-01-05: qty 2

## 2019-01-05 MED ORDER — SODIUM CHLORIDE 0.9% FLUSH: 3.0000 mL | INTRAVENOUS | Status: DC | PRN

## 2019-01-05 MED ORDER — ATORVASTATIN CALCIUM 10 MG PO TABS: 10.0000 mg | ORAL_TABLET | Freq: Every day | ORAL | 11 refills | Status: DC

## 2019-01-05 MED ORDER — ASPIRIN EC 81 MG PO TBEC
81.0000 mg | DELAYED_RELEASE_TABLET | Freq: Every day | ORAL | Status: DC
  Administered 2019-01-05: 81 mg via ORAL

## 2019-01-05 MED ORDER — HEPARIN SODIUM (PORCINE) 1000 UNIT/ML IJ SOLN
INTRAMUSCULAR | Status: AC
  Filled 2019-01-05: qty 1

## 2019-01-05 SURGICAL SUPPLY — 21 items
BALLN ULTRVRSE 2X150X150 (BALLOONS) ×2
BALLN ULTRVRSE 2X150X150 OTW (BALLOONS) ×1
BALLN ULTRVRSE 3X150X150 (BALLOONS) ×3
BALLOON ULTRVRSE 2X150X150 OTW (BALLOONS) IMPLANT
BALLOON ULTRVRSE 3X150X150 (BALLOONS) IMPLANT
CATH BEACON 5 .038 100 VERT TP (CATHETERS) ×2 IMPLANT
CATH CXI SUPP ANG 4FR 135 (CATHETERS) IMPLANT
CATH CXI SUPP ANG 4FR 135CM (CATHETERS) ×3
CATH PIG 70CM (CATHETERS) ×2 IMPLANT
COVER PROBE U/S 5X48 (MISCELLANEOUS) ×2 IMPLANT
DEVICE PRESTO INFLATION (MISCELLANEOUS) ×2 IMPLANT
DEVICE STARCLOSE SE CLOSURE (Vascular Products) ×2 IMPLANT
GUIDEWIRE PFTE-COATED .018X300 (WIRE) ×2 IMPLANT
PACK ANGIOGRAPHY (CUSTOM PROCEDURE TRAY) ×3 IMPLANT
SHEATH ANL2 6FRX45 HC (SHEATH) ×2 IMPLANT
SHEATH BRITE TIP 5FRX11 (SHEATH) ×2 IMPLANT
SYR MEDRAD MARK 7 150ML (SYRINGE) ×2 IMPLANT
TUBING CONTRAST HIGH PRESS 72 (TUBING) ×2 IMPLANT
WIRE G V18X300CM (WIRE) ×2 IMPLANT
WIRE J 3MM .035X145CM (WIRE) ×2 IMPLANT
WIRE MAGIC TORQUE 260C (WIRE) ×2 IMPLANT

## 2019-01-05 NOTE — H&P (Signed)
[facility] History & Physical Update  The patient was interviewed and re-examined.  The patient's previous History and Physical has been reviewed and is unchanged.  There is no change in the plan of care. We plan to proceed with the scheduled procedure.  Leotis Pain, MD  01/05/2019, 10:28 AM

## 2019-01-05 NOTE — Op Note (Signed)
[facility]  Percutaneous Study/Intervention Procedural Note   Date of Surgery: 01/05/2019  Surgeon(s):DEW,JASON    Assistants:none  Pre-operative Diagnosis: PAD with gangrene right foot  Post-operative diagnosis:  Same  Procedure(s) Performed:             1.  Ultrasound guidance for vascular access left femoral artery             2.  Catheter placement into right SFA from left femoral approach             3.  Aortogram and selective right lower extremity angiogram including selective imaging of the right posterior tibial artery and peroneal artery             4.  Percutaneous transluminal angioplasty of the peroneal artery with 2 mm diameter angioplasty balloon distally and 3 mm diameter angioplasty balloon from the mid to distal peroneal artery up to the proximal peroneal artery             5.  StarClose closure device left femoral artery  EBL: 5 cc  Contrast: 85 cc  Fluoro Time: 8.7 minutes  Moderate Conscious Sedation Time: approximately 30 minutes using 2 mg of Versed and 50 mcg of Fentanyl              Indications:  Patient is a 85 y.o.female with gangrenous change to a toe on the right foot. The patient has noninvasive study showing a flat digital tracing and reduced ABIs on the right. The patient is brought in for angiography for further evaluation and potential treatment.  Due to the limb threatening nature of the situation, angiogram was performed for attempted limb salvage. The patient is aware that if the procedure fails, amputation would be expected.  The patient also understands that even with successful revascularization, amputation may still be required due to the severity of the situation.  Risks and benefits are discussed and informed consent is obtained.   Procedure:  The patient was identified and appropriate procedural time out was performed.  The patient was then placed supine on the table and prepped and draped in the usual sterile  fashion. Moderate conscious sedation was administered during a face to face encounter with the patient throughout the procedure with my supervision of the RN administering medicines and monitoring the patient's vital signs, pulse oximetry, telemetry and mental status throughout from the start of the procedure until the patient was taken to the recovery room. Ultrasound was used to evaluate the left common femoral artery.  It was patent .  A digital ultrasound image was acquired.  A Seldinger needle was used to access the left common femoral artery under direct ultrasound guidance and a permanent image was performed.  A 0.035 J wire was advanced without resistance and a 5Fr sheath was placed.  Pigtail catheter was placed into the aorta and an AP aortogram was performed. This demonstrated normal renal arteries and normal aorta and iliac segments without significant stenosis. I then crossed the aortic bifurcation and advanced to the right femoral head and then into the proximal SFA to help opacify distally. Selective right lower extremity angiogram was then performed. This demonstrated reasonably normal common femoral artery, profunda femoris artery, superficial femoral artery, and popliteal artery with her previous SFA interventions being patent with no significant stenosis of more than 20 to 30% at this time.  There was severe tibial disease.  The peroneal artery was the best runoff distally but it was heavily diseased and  occluded in the distal calf area.  There was distal reconstitution with the medial and lateral tarsal vessels.  The anterior tibial artery was chronically occluded without distal reconstitution.  Posterior tibial arteries patent proximally but occluded distally and on initial imaging no distal reconstitution was seen although image quality was fairly poor due to continuous patient motion. It was felt that it was in the patient's best interest to proceed with intervention after these images to avoid  a second procedure and a larger amount of contrast and fluoroscopy based off of the findings from the initial angiogram. The patient was systemically heparinized and a 6 [place] Ansell sheath was then placed over the Magic torque wire. I then used a Kumpe catheter and the 0.018 advantage wire to get down into the tibioperoneal trunk.  I first entered the posterior tibial artery and performed a selective image that showed this to occlude in the mid to distal segment with no distal reconstitution that would provide a target for revascularization.  I then pulled the catheter back to the tibioperoneal trunk and advanced down to the proximal peroneal artery to help evaluate this better as our original images were poor due to patient motion.  The proximal portion of the peroneal artery was patent after intervention last week, but the midsegment had a greater than 80% stenosis and it occluded in the mid to distal segment.  There was distal reconstitution and so I used the CXI catheter and the 0.018 advantage wire to get down across the occlusion and get a wire all the way down into the foot.  I then used a 2 mm diameter by 15 cm length angioplasty balloon from the ankle up to the mid peroneal artery and inflated this up to 10 atm for 1 minute.  This was slightly undersized in the midsegment so a 3 mm diameter by 15 cm length angioplasty balloon was taken first into the distal peroneal artery and gently inflated to 6 atm for 1 minute.  It was then pulled back to the proximal and mid peroneal artery and inflated up again to 6 atm for 1 minute.  Completion imaging showed brisk flow into the foot with about a 20 to 25% residual stenosis in the distal segment and about a 15% residual stenosis in the mid segment of the peroneal artery.  There is nothing further we can do to improve her perfusion at this time and her previous more proximal interventions remain patent from her last procedure. I elected to terminate the procedure.  The sheath was removed and StarClose closure device was deployed in the left femoral artery with excellent hemostatic result. The patient was taken to the recovery room in stable condition having tolerated the procedure well.  Findings:               Aortogram:  No significant stenosis of the renal arteries, normal aorta and iliac arteries without significant stenosis             Right lower Extremity:  Reasonably normal common femoral artery, profunda femoris artery, superficial femoral artery, and popliteal artery with her previous SFA interventions being patent with no significant stenosis of more than 20 to 30% at this time.  There was severe tibial disease.  The peroneal artery was the best runoff distally but it was heavily diseased and occluded in the distal calf area.  There was distal reconstitution with the medial and lateral tarsal vessels.  The anterior tibial artery was chronically occluded without distal reconstitution.  Posterior tibial arteries patent proximally but occluded distally and on initial imaging no distal reconstitution was seen although image quality was fairly poor due to continuous patient motion   Disposition: Patient was taken to the recovery room in stable condition having tolerated the procedure well.  Complications: None  Sandy Roberson 01/05/2019 12:54 PM   This note was created with Dragon Medical transcription system. Any errors in dictation are purely unintentional.

## 2019-01-06 ENCOUNTER — Encounter: Payer: Self-pay | Admitting: Vascular Surgery

## 2019-01-20 ENCOUNTER — Telehealth (INDEPENDENT_AMBULATORY_CARE_PROVIDER_SITE_OTHER): Payer: Self-pay

## 2019-01-20 NOTE — Telephone Encounter (Signed)
Patient son called stating that his mom was seen by DrTroxler office on yesterday and stated that she needed an appointment in a week to be coordinated with Dr Lucky Cowboy. The patient is having left calf pain and while walking the patient is dragging her foot.Also the patient has an nonhealing open wound on right toe and the patient son stated that Dr Elvina Mattes worried about the patient having an bone infection. The patient had a angio on 01/05/2019 and a appointment schedule for 02/03/2019 and I talk to Dr Lucky Cowboy and stated the patient appointment can be moved up. Patient appointment has been moved up and the patient son stated he will call Dr Elvina Mattes office.

## 2019-01-22 ENCOUNTER — Telehealth (INDEPENDENT_AMBULATORY_CARE_PROVIDER_SITE_OTHER): Payer: Self-pay | Admitting: Nurse Practitioner

## 2019-01-22 NOTE — Telephone Encounter (Signed)
D/C ing the med would not be in the best interest given that she has just had a recent intervention and we still have concerns about her circulation.  Let's try to continue to take it and if we have any other bleeding issues we may need to look into something different.  Also, try using saline nasal spray because during this time of year the air gets dry and that can cause nosebleeds.  Please let us know if it happens again

## 2019-01-22 NOTE — Telephone Encounter (Signed)
I spoke with Sandy Roberson and advised him of the below and he verbalized understanding. He said he was very concerned to still have Sandy Roberson on the blood thinner but that he would give it to her and call back with any issues. AS, CMA

## 2019-01-22 NOTE — Telephone Encounter (Signed)
Patient son called and said patient has a significant nose bleed last night that took them 30 mins to completely stop. He thinks this is caused by the clopidogrel and did not give her the med this morning. Would like permission to d/c med. Please advise. AS, CMA

## 2019-01-23 ENCOUNTER — Other Ambulatory Visit (INDEPENDENT_AMBULATORY_CARE_PROVIDER_SITE_OTHER): Payer: Self-pay | Admitting: Vascular Surgery

## 2019-01-23 DIAGNOSIS — I70261 Atherosclerosis of native arteries of extremities with gangrene, right leg: Secondary | ICD-10-CM

## 2019-01-23 DIAGNOSIS — Z9862 Peripheral vascular angioplasty status: Secondary | ICD-10-CM

## 2019-01-26 ENCOUNTER — Encounter (INDEPENDENT_AMBULATORY_CARE_PROVIDER_SITE_OTHER): Payer: Self-pay | Admitting: Nurse Practitioner

## 2019-01-26 ENCOUNTER — Other Ambulatory Visit: Payer: Self-pay

## 2019-01-26 ENCOUNTER — Ambulatory Visit (INDEPENDENT_AMBULATORY_CARE_PROVIDER_SITE_OTHER): Payer: Medicare Other | Admitting: Nurse Practitioner

## 2019-01-26 ENCOUNTER — Ambulatory Visit (INDEPENDENT_AMBULATORY_CARE_PROVIDER_SITE_OTHER): Payer: Medicare Other

## 2019-01-26 VITALS — BP 167/92 | HR 76 | Resp 17 | Ht 64.0 in | Wt 118.0 lb

## 2019-01-26 DIAGNOSIS — Z9862 Peripheral vascular angioplasty status: Secondary | ICD-10-CM | POA: Diagnosis not present

## 2019-01-26 DIAGNOSIS — E785 Hyperlipidemia, unspecified: Secondary | ICD-10-CM

## 2019-01-26 DIAGNOSIS — I7025 Atherosclerosis of native arteries of other extremities with ulceration: Secondary | ICD-10-CM | POA: Diagnosis not present

## 2019-01-26 DIAGNOSIS — I70269 Atherosclerosis of native arteries of extremities with gangrene, unspecified extremity: Secondary | ICD-10-CM

## 2019-01-26 DIAGNOSIS — I70261 Atherosclerosis of native arteries of extremities with gangrene, right leg: Secondary | ICD-10-CM | POA: Diagnosis not present

## 2019-01-26 DIAGNOSIS — I10 Essential (primary) hypertension: Secondary | ICD-10-CM

## 2019-02-01 ENCOUNTER — Encounter (INDEPENDENT_AMBULATORY_CARE_PROVIDER_SITE_OTHER): Payer: Self-pay | Admitting: Nurse Practitioner

## 2019-02-02 NOTE — Progress Notes (Signed)
SUBJECTIVE:  Patient ID: Sandy Roberson, female    DOB: November 02, 1915, 85 y.o.   MRN: [id number] Chief Complaint  Patient presents with   Follow-up    post op from angio    HPI  Sandy Roberson is a 85 y.o. female that presents today for follow up after angiogram on 01/05/2019.  The patient continues to have a wound followed by podiatry.  Per the patient's son there is a planned upcoming surgery to removed an area of bone that is causing issue.  The patient recently had a nosebleed but it was resolved without issue.  She denies any fever, chills, nausea or vomiting.  She denies chest pain or shortness of breath.    The patient's right ABI is 0.93 with a TBI of 0.28.  The patient's left ABI is 0.85 and a TBI of 0.18.  Previously the right ABI was 0.29 with no TBI on the right and 0.70 on the left.  The patient has strong monophasic waveforms in the bilateral tibial arteries.  There are strong toe waveform bilaterally.  Past Medical History:  Diagnosis Date   Coronary artery disease    Hypertension    Renal disorder     Past Surgical History:  Procedure Laterality Date   CORONARY STENT PLACEMENT     ESOPHAGOGASTRODUODENOSCOPY (EGD) WITH PROPOFOL N/A 10/20/2018   Procedure: ESOPHAGOGASTRODUODENOSCOPY (EGD) WITH PROPOFOL;  Surgeon: Lucilla Lame, MD;  Location: [facility];  Service: Endoscopy;  Laterality: N/A;   FRACTURE SURGERY     LOWER EXTREMITY ANGIOGRAPHY Right 11/24/2018   Procedure: LOWER EXTREMITY ANGIOGRAPHY;  Surgeon: Algernon Huxley, MD;  Location: [facility];  Service: Cardiovascular;  Laterality: Right;   LOWER EXTREMITY ANGIOGRAPHY Right 01/05/2019   Procedure: LOWER EXTREMITY ANGIOGRAPHY;  Surgeon: Algernon Huxley, MD;  Location: [facility];  Service: Cardiovascular;  Laterality: Right;   STENT PLACEMENT RT URETER ([place] HX)     since been removed     Social History   Socioeconomic History   Marital status: Widowed    Spouse name:  Not on file   Number of children: Not on file   Years of education: Not on file   Highest education level: Not on file  Occupational History   Not on file  Social Needs   Financial resource strain: Not on file   Food insecurity    Worry: Not on file    Inability: Not on file   Transportation needs    Medical: Not on file    Non-medical: Not on file  Tobacco Use   Smoking status: Never Smoker   Smokeless tobacco: Never Used  Substance and Sexual Activity   Alcohol use: No   Drug use: Never   Sexual activity: Not Currently  Lifestyle   Physical activity    Days per week: Not on file    Minutes per session: Not on file   Stress: Not on file  Relationships   Social connections    Talks on phone: Not on file    Gets together: Not on file    Attends religious service: Not on file    Active member of club or organization: Not on file    Attends meetings of clubs or organizations: Not on file    Relationship status: Not on file   Intimate partner violence    Fear of current or ex partner: Not on file    Emotionally abused: Not on file    Physically abused: Not on  file    Forced sexual activity: Not on file  Other Topics Concern   Not on file  Social History Narrative   Not on file    Family History  Problem Relation Age of Onset   Heart failure Neg Hx     Allergies  Allergen Reactions   Penicillins Rash     Review of Systems   Review of Systems: Negative Unless Checked Constitutional: [] Weight loss  [] Fever  [] Chills Cardiac: [] Chest pain   []  Atrial Fibrillation  [] Palpitations   [] Shortness of breath when laying flat   [] Shortness of breath with exertion. [] Shortness of breath at rest Vascular:  [] Pain in legs with walking   [] Pain in legs with standing [] Pain in legs when laying flat   [x] Claudication    [] Pain in feet when laying flat    [] History of DVT   [] Phlebitis   [] Swelling in legs   [x] Varicose veins   [x] Non-healing  ulcers Pulmonary:   [] Uses home oxygen   [] Productive cough   [] Hemoptysis   [] Wheeze  [] COPD   [] Asthma Neurologic:  [] Dizziness   [] Seizures  [] Blackouts [] History of stroke   [] History of TIA  [] Aphasia   [] Temporary Blindness   [] Weakness or numbness in arm   [] Weakness or numbness in leg Musculoskeletal:   [] Joint swelling   [] Joint pain   [] Low back pain  []  History of Knee Replacement [] Arthritis [] back Surgeries  []  Spinal Stenosis    Hematologic:  [] Easy bruising  [] Easy bleeding   [] Hypercoagulable state   [] Anemic Gastrointestinal:  [] Diarrhea   [] Vomiting  [] Gastroesophageal reflux/heartburn   [] Difficulty swallowing. [] Abdominal pain Genitourinary:  [x] Chronic kidney disease   [] Difficult urination  [] Anuric   [] Blood in urine [] Frequent urination  [] Burning with urination   [] Hematuria Skin:  [] Rashes   [] Ulcers [x] Wounds Psychological:  [] History of anxiety   []  History of major depression  []  Memory Difficulties      OBJECTIVE:   Physical Exam  BP (!) 167/92 (BP Location: Right Arm)    Pulse 76    Resp 17    Ht 5\' 4"  (1.626 m)    Wt 118 lb (53.5 kg)    BMI 20.25 kg/m   Gen: WD/WN, NAD Head: [place]/AT, No temporalis wasting.  Ear/Nose/Throat: Hearing grossly intact, nares w/o erythema or drainage Eyes: PER, EOMI, sclera nonicteric.  Neck: Supple, no masses.  No JVD.  Pulmonary:  Good air movement, no use of accessory muscles.  Cardiac: RRR Vascular: wound right foot, still bandaged Vessel Right Left  Radial Palpable Palpable  Dorsalis Pedis Not Palpable Not Palpable  Posterior Tibial Not Palpable Not Palpable   Gastrointestinal: soft, non-distended. No guarding/no peritoneal signs.  Musculoskeletal: W  No deformity or atrophy.  Neurologic: Pain and light touch intact in extremities.  Symmetrical.  Speech is fluent. Motor exam as listed above. Psychiatric: Judgment intact, Mood & affect appropriate for pt's clinical situation. Dermatologic: No Venous rashes. No Ulcers  Noted.  No changes consistent with cellulitis. Lymph : No Cervical lymphadenopathy, no lichenification or skin changes of chronic lymphedema.       ASSESSMENT AND PLAN:  1. Atherosclerosis of native arteries of the extremities with ulceration ([place]) Currently the patient has much improved perfusion compared to previous studies.  She still has monophasic waveforms and reduced TBIs but the monophasic waveforms are strong with strong toe waveforms.  Given her advanced age and heavily diseased tibial arteries this is a good result. The patient is especially instructed to take  anticoagulation to prevent stent restenosis  2. Hyperlipidemia, unspecified hyperlipidemia type Continue statin as ordered and reviewed, no changes at this time   3. Essential hypertension Continue antihypertensive medications as already ordered, these medications have been reviewed and there are no changes at this time.    Current Outpatient Medications on File Prior to Visit  Medication Sig Dispense Refill   atorvastatin (LIPITOR) 10 MG tablet Take 1 tablet (10 mg total) by mouth daily. 30 tablet 11   calcitRIOL (ROCALTROL) 0.25 MCG capsule Take 1 capsule by mouth daily. Daily at 1800     carvedilol (COREG) 3.125 MG tablet Take 1 tablet (3.125 mg total) by mouth 2 (two) times daily with a meal. 60 tablet 1   clopidogrel (PLAVIX) 75 MG tablet Take 1 tablet (75 mg total) by mouth daily. 30 tablet 11   ALPRAZolam (XANAX) 0.25 MG tablet      ASPIRIN 81 PO Take 81 mg by mouth daily.      methimazole (TAPAZOLE) 5 MG tablet Take 1 tablet by mouth daily.     SANTYL ointment      traMADol (ULTRAM) 50 MG tablet Take by mouth.     No current facility-administered medications on file prior to visit.     There are no Patient Instructions on file for this visit. No follow-ups on file.   Kris Hartmann, NP  This note was completed with Sales executive.  Any errors are purely unintentional.

## 2019-02-03 ENCOUNTER — Encounter (INDEPENDENT_AMBULATORY_CARE_PROVIDER_SITE_OTHER): Payer: Medicare Other

## 2019-02-03 ENCOUNTER — Ambulatory Visit (INDEPENDENT_AMBULATORY_CARE_PROVIDER_SITE_OTHER): Payer: Medicare Other | Admitting: Vascular Surgery

## 2019-02-11 ENCOUNTER — Ambulatory Visit (INDEPENDENT_AMBULATORY_CARE_PROVIDER_SITE_OTHER): Payer: Medicare Other | Admitting: Podiatry

## 2019-02-11 ENCOUNTER — Other Ambulatory Visit: Payer: Self-pay

## 2019-02-11 DIAGNOSIS — S81801D Unspecified open wound, right lower leg, subsequent encounter: Secondary | ICD-10-CM | POA: Diagnosis not present

## 2019-02-11 NOTE — Progress Notes (Signed)
She presents with her son today he is requesting a second opinion regarding the first metatarsophalangeal joint wound right foot.  I saw this patient back in January 2020 with a ulcerative wound to the right foot.  We sent her to vascular for an evaluation and that she is followed up with Dr. Elvina Mattes since that time.  Dr. Elvina Mattes is advocating removal of the head of the first metatarsal.  Objective: Vital signs are stable pulses are nonpalpable right lower extremity ulcerative lesion is still present with the head of the metatarsal visible through the wound.  The one thing that has improved here is the blood flow and that we have now have granulation tissue that will soon epithelialized the skin that we know that we will more than likely never cover the bone itself I see no signs of infection at this time.  Assessment: Chronic ulceration most likely osteomyelitis head of the first metatarsal right foot.  After revascularization she appears to be improving as far as soft tissue goes.  Plan: Discussed etiology pathology conservative versus surgical therapies.  At this point expressed to her son that more than likely she would need to be hospitalized for this and I feel that Dr. Elvina Mattes be acceptable for this since he is hospital-based.  I feel that more than likely over the next couple of weeks she will start to develop some soft tissue healing and the surgery will be much less invasive.

## 2019-02-25 ENCOUNTER — Inpatient Hospital Stay
Admission: AD | Admit: 2019-02-25 | Discharge: 2019-02-28 | DRG: 504 | Disposition: A | Discharge status: Home Health | Payer: Medicare Other | Source: Ambulatory Visit | Attending: Internal Medicine | Admitting: Internal Medicine

## 2019-02-25 ENCOUNTER — Other Ambulatory Visit: Payer: Self-pay

## 2019-02-25 DIAGNOSIS — L089 Local infection of the skin and subcutaneous tissue, unspecified: Secondary | ICD-10-CM | POA: Diagnosis not present

## 2019-02-25 DIAGNOSIS — H9190 Unspecified hearing loss, unspecified ear: Secondary | ICD-10-CM | POA: Diagnosis present

## 2019-02-25 DIAGNOSIS — Z7902 Long term (current) use of antithrombotics/antiplatelets: Secondary | ICD-10-CM | POA: Diagnosis not present

## 2019-02-25 DIAGNOSIS — Z87440 Personal history of urinary (tract) infections: Secondary | ICD-10-CM

## 2019-02-25 DIAGNOSIS — R1310 Dysphagia, unspecified: Secondary | ICD-10-CM | POA: Diagnosis present

## 2019-02-25 DIAGNOSIS — Z20828 Contact with and (suspected) exposure to other viral communicable diseases: Secondary | ICD-10-CM | POA: Diagnosis present

## 2019-02-25 DIAGNOSIS — Z79899 Other long term (current) drug therapy: Secondary | ICD-10-CM

## 2019-02-25 DIAGNOSIS — Z955 Presence of coronary angioplasty implant and graft: Secondary | ICD-10-CM | POA: Diagnosis not present

## 2019-02-25 DIAGNOSIS — I2510 Atherosclerotic heart disease of native coronary artery without angina pectoris: Secondary | ICD-10-CM | POA: Diagnosis present

## 2019-02-25 DIAGNOSIS — L97519 Non-pressure chronic ulcer of other part of right foot with unspecified severity: Secondary | ICD-10-CM | POA: Diagnosis present

## 2019-02-25 DIAGNOSIS — Z7982 Long term (current) use of aspirin: Secondary | ICD-10-CM | POA: Diagnosis not present

## 2019-02-25 DIAGNOSIS — I739 Peripheral vascular disease, unspecified: Secondary | ICD-10-CM | POA: Diagnosis present

## 2019-02-25 DIAGNOSIS — N183 Chronic kidney disease, stage 3 unspecified: Secondary | ICD-10-CM | POA: Diagnosis present

## 2019-02-25 DIAGNOSIS — D649 Anemia, unspecified: Secondary | ICD-10-CM | POA: Diagnosis present

## 2019-02-25 DIAGNOSIS — I130 Hypertensive heart and chronic kidney disease with heart failure and stage 1 through stage 4 chronic kidney disease, or unspecified chronic kidney disease: Secondary | ICD-10-CM | POA: Diagnosis present

## 2019-02-25 DIAGNOSIS — M869 Osteomyelitis, unspecified: Principal | ICD-10-CM | POA: Diagnosis present

## 2019-02-25 DIAGNOSIS — I10 Essential (primary) hypertension: Secondary | ICD-10-CM

## 2019-02-25 DIAGNOSIS — I5032 Chronic diastolic (congestive) heart failure: Secondary | ICD-10-CM | POA: Diagnosis present

## 2019-02-25 DIAGNOSIS — E0590 Thyrotoxicosis, unspecified without thyrotoxic crisis or storm: Secondary | ICD-10-CM | POA: Diagnosis present

## 2019-02-25 LAB — HEMOGLOBIN A1C
Hgb A1c MFr Bld: 5.4 % (ref 4.8–5.6)
Mean Plasma Glucose: 108.28 mg/dL

## 2019-02-25 LAB — BASIC METABOLIC PANEL
Anion gap: 8 (ref 5–15)
BUN: 44 mg/dL — ABNORMAL HIGH (ref 8–23)
CO2: 25 mmol/L (ref 22–32)
Calcium: 9.2 mg/dL (ref 8.9–10.3)
Chloride: 107 mmol/L (ref 98–111)
Creatinine, Ser: 1.46 mg/dL — ABNORMAL HIGH (ref 0.44–1.00)
GFR calc Af Amer: 34 mL/min — ABNORMAL LOW (ref >60)
GFR calc non Af Amer: 29 mL/min — ABNORMAL LOW (ref >60)
Glucose, Bld: 161 mg/dL — ABNORMAL HIGH (ref 70–99)
Potassium: 4.9 mmol/L (ref 3.5–5.1)
Sodium: 140 mmol/L (ref 135–145)

## 2019-02-25 LAB — CBC
HCT: 32.9 % — ABNORMAL LOW (ref 36.0–46.0)
Hemoglobin: 10.6 g/dL — ABNORMAL LOW (ref 12.0–15.0)
MCH: 31.2 pg (ref 26.0–34.0)
MCHC: 32.2 g/dL (ref 30.0–36.0)
MCV: 96.8 fL (ref 80.0–100.0)
Platelets: 174 10*3/uL (ref 150–400)
RBC: 3.4 MIL/uL — ABNORMAL LOW (ref 3.87–5.11)
RDW: 15 % (ref 11.5–15.5)
WBC: 5.3 10*3/uL (ref 4.0–10.5)
nRBC: 0 % (ref 0.0–0.2)

## 2019-02-25 LAB — MAGNESIUM: Magnesium: 2.1 mg/dL (ref 1.7–2.4)

## 2019-02-25 MED ORDER — ACETAMINOPHEN 325 MG PO TABS
650.0000 mg | ORAL_TABLET | Freq: Four times a day (QID) | ORAL | Status: DC | PRN
  Administered 2019-02-25 – 2019-02-26 (×2): 650 mg via ORAL
  Filled 2019-02-25 (×2): qty 2

## 2019-02-25 MED ORDER — SENNOSIDES-DOCUSATE SODIUM 8.6-50 MG PO TABS: 1.0000 | ORAL_TABLET | Freq: Every evening | ORAL | Status: DC | PRN

## 2019-02-25 MED ORDER — TRIMETHOPRIM 100 MG PO TABS
100.0000 mg | ORAL_TABLET | Freq: Every day | ORAL | Status: DC
  Administered 2019-02-27 – 2019-02-28 (×2): 100 mg via ORAL
  Filled 2019-02-25 (×3): qty 1

## 2019-02-25 MED ORDER — CLOPIDOGREL BISULFATE 75 MG PO TABS
75.0000 mg | ORAL_TABLET | Freq: Every day | ORAL | Status: DC
  Administered 2019-02-27 – 2019-02-28 (×2): 75 mg via ORAL
  Filled 2019-02-25 (×3): qty 1

## 2019-02-25 MED ORDER — ATORVASTATIN CALCIUM 10 MG PO TABS
10.0000 mg | ORAL_TABLET | Freq: Every day | ORAL | Status: DC
  Administered 2019-02-27 – 2019-02-28 (×2): 10 mg via ORAL
  Filled 2019-02-25 (×3): qty 1

## 2019-02-25 MED ORDER — ENOXAPARIN SODIUM 40 MG/0.4ML ~~LOC~~ SOLN: 40.0000 mg | SUBCUTANEOUS | Status: DC

## 2019-02-25 MED ORDER — HYDROCODONE-ACETAMINOPHEN 5-325 MG PO TABS: 1.0000 | ORAL_TABLET | ORAL | Status: DC | PRN

## 2019-02-25 MED ORDER — ACETAMINOPHEN 650 MG RE SUPP: 650.0000 mg | Freq: Four times a day (QID) | RECTAL | Status: DC | PRN

## 2019-02-25 MED ORDER — LEVOFLOXACIN IN D5W 500 MG/100ML IV SOLN
500.0000 mg | INTRAVENOUS | Status: AC
  Administered 2019-02-26: 500 mg via INTRAVENOUS
  Filled 2019-02-25: qty 100

## 2019-02-25 MED ORDER — ENOXAPARIN SODIUM 30 MG/0.3ML ~~LOC~~ SOLN
30.0000 mg | SUBCUTANEOUS | Status: DC
  Administered 2019-02-26: 30 mg via SUBCUTANEOUS
  Filled 2019-02-25: qty 0.3

## 2019-02-25 MED ORDER — CARVEDILOL 6.25 MG PO TABS
3.1250 mg | ORAL_TABLET | Freq: Two times a day (BID) | ORAL | Status: DC
  Administered 2019-02-25 – 2019-02-28 (×6): 3.125 mg via ORAL
  Filled 2019-02-25 (×6): qty 1

## 2019-02-25 MED ORDER — HYDROCODONE-ACETAMINOPHEN 5-325 MG PO TABS
1.0000 | ORAL_TABLET | Freq: Four times a day (QID) | ORAL | Status: DC | PRN
  Administered 2019-02-27 (×2): 1 via ORAL
  Filled 2019-02-25 (×3): qty 1

## 2019-02-25 MED ORDER — SODIUM CHLORIDE 0.9 % IV SOLN
INTRAVENOUS | Status: AC
  Administered 2019-02-25 – 2019-02-26 (×3): via INTRAVENOUS

## 2019-02-25 MED ORDER — METHIMAZOLE 5 MG PO TABS
5.0000 mg | ORAL_TABLET | Freq: Every day | ORAL | Status: DC
  Administered 2019-02-27 – 2019-02-28 (×2): 5 mg via ORAL
  Filled 2019-02-25 (×4): qty 1

## 2019-02-25 MED ORDER — VITAMIN D 25 MCG (1000 UNIT) PO TABS
2000.0000 [IU] | ORAL_TABLET | ORAL | Status: DC
  Administered 2019-02-27: 2000 [IU] via ORAL
  Filled 2019-02-25 (×2): qty 2

## 2019-02-25 MED ORDER — ASPIRIN EC 81 MG PO TBEC
81.0000 mg | DELAYED_RELEASE_TABLET | Freq: Every day | ORAL | Status: DC
  Administered 2019-02-27 – 2019-02-28 (×2): 81 mg via ORAL
  Filled 2019-02-25 (×3): qty 1

## 2019-02-25 NOTE — Consult Note (Signed)
[facility] Podiatry                                                      Patient Demographics  Sandy Roberson, is a 85 y.o. female   MRN: [id number]   DOB - 10-02-1915  Admit Date - 02/25/2019    Outpatient Primary MD for the patient is Tracie Harrier, MD  Consult requested in the Hospital by Flora Lipps, MD, On 02/25/2019    Reason for consult chronic ulceration right foot with likely osteomyelitis first metatarsal head   With History of -  Past Medical History:  Diagnosis Date  . Coronary artery disease   . Hypertension   . Renal disorder       Past Surgical History:  Procedure Laterality Date  . CORONARY STENT PLACEMENT    . ESOPHAGOGASTRODUODENOSCOPY (EGD) WITH PROPOFOL N/A 10/20/2018   Procedure: ESOPHAGOGASTRODUODENOSCOPY (EGD) WITH PROPOFOL;  Surgeon: Lucilla Lame, MD;  Location: [facility] ENDOSCOPY;  Service: Endoscopy;  Laterality: N/A;  . FRACTURE SURGERY    . LOWER EXTREMITY ANGIOGRAPHY Right 11/24/2018   Procedure: LOWER EXTREMITY ANGIOGRAPHY;  Surgeon: Algernon Huxley, MD;  Location: [place] CV LAB;  Service: Cardiovascular;  Laterality: Right;  . LOWER EXTREMITY ANGIOGRAPHY Right 01/05/2019   Procedure: LOWER EXTREMITY ANGIOGRAPHY;  Surgeon: Algernon Huxley, MD;  Location: [place] CV LAB;  Service: Cardiovascular;  Laterality: Right;  . STENT PLACEMENT RT [facility] HX)     since been removed     in for   No chief complaint on file.    HPI  Sandy Roberson  is a 85 y.o. female, patient has had a wound on the right foot that has not healed over the last couple months.  She has had recent vascular intervention which was shown improvement with wound healing but unfortunately she had significant erosion of skin soft tissue such that the bone has been exposed now for the last month.  There are some early changes of  possible osteomyelitis on x-ray and on exam.  She does not have much in the way of cellulitis or overt infection in the surrounding tissues.    Review of Systems: She is very pleasant alert and well-oriented to 85 year old patient.  She is usually accompanied by her son who had extensive discussions and explanations with all of our plans.  In addition to the HPI above,  No Fever-chills, No Headache, No changes with Vision or hearing, No problems swallowing food or Liquids, No Chest pain, Cough or Shortness of Breath, No Abdominal pain, No Nausea or Vommitting, Bowel movements are regular, No Blood in stool or Urine, No dysuria, No new skin rashes or bruises, No new joints pains-aches,  No new weakness, tingling, numbness in any extremity, No recent weight gain or loss, No polyuria, polydypsia or polyphagia, No significant Mental Stressors.  A full 10 point Review of Systems was done, except as stated above, all other Review of Systems were negative.   Social History Social History   Tobacco Use  . Smoking status: Never Smoker  . Smokeless tobacco: Never Used  Substance Use Topics  . Alcohol use: No    Family History Family History  Problem Relation Age of Onset  . Heart failure Neg Hx     Prior to Admission medications   Medication Sig Start Date End  Date Taking? Authorizing Provider  ASPIRIN 81 PO Take 81 mg by mouth daily.  07/12/10   [provider]  atorvastatin (LIPITOR) 10 MG tablet Take 1 tablet (10 mg total) by mouth daily. 01/05/19 01/05/20  Algernon Huxley, MD  carvedilol (COREG) 3.125 MG tablet Take 1 tablet (3.125 mg total) by mouth 2 (two) times daily with a meal. 09/25/14   Gladstone Lighter, MD  Cholecalciferol (VITAMIN D3) 50 MCG (2000 UT) TABS Take 2,000 Units by mouth every other day.    [provider]  clopidogrel (PLAVIX) 75 MG tablet Take 1 tablet (75 mg total) by mouth daily. 11/24/18   Algernon Huxley, MD  methimazole (TAPAZOLE) 5 MG  tablet Take 5 mg by mouth daily.     [provider]  trimethoprim (TRIMPEX) 100 MG tablet Take 100 mg by mouth daily.    [provider]    Anti-infectives (From admission, onward)   Start     Dose/Rate Route Frequency Ordered Stop   02/25/19 1515  trimethoprim (TRIMPEX) tablet 100 mg     100 mg Oral Daily 02/25/19 1514        Scheduled Meds: . aspirin EC  81 mg Oral Daily  . atorvastatin  10 mg Oral Daily  . carvedilol  3.125 mg Oral BID WC  . cholecalciferol  2,000 Units Oral QODAY  . clopidogrel  75 mg Oral Daily  . [START ON 02/26/2019] enoxaparin (LOVENOX) injection  30 mg Subcutaneous Q24H  . methimazole  5 mg Oral Daily  . trimethoprim  100 mg Oral Daily   Continuous Infusions: . sodium chloride 75 mL/hr at 02/25/19 1600   PRN Meds:.acetaminophen **OR** acetaminophen, HYDROcodone-acetaminophen, senna-docusate  Allergies  Allergen Reactions  . Penicillins Rash    Did it involve swelling of the face/tongue/throat, SOB, or low BP? No Did it involve sudden or severe rash/hives, skin peeling, or any reaction on the inside of your mouth or nose? No Did you need to seek medical attention at a hospital or doctor's office? No When did it last happen?10 Years If all above answers are "NO", may proceed with cephalosporin use.    Physical Exam  Vitals  Blood pressure (!) 181/94, pulse 90, temperature 97.7 F (36.5 C), temperature source Oral, weight 54.2 kg, SpO2 99 %.  Lower Extremity exam:  Vascular: DP and PT pulses are diminished bilaterally.  Dermatological: Patient has a wound on the right first metatarsal head is approximately 3 cm in length and 3.2 cm in width.  There has been significant improvement in the tissues with some granular formation since she has had a vascular improvement procedures done.  Bone however is still exposed in the wound and is at risk for infection and causing worsening infection to the foot.  Unlikely this wound would  ever heal unless appropriate bone was removed.  Neurological: Some peripheral neuropathy but does have some pain with the foot also.  Ortho: Severe hallux valgus changes with prominent medial first metatarsal head prominence and resultant erosion through the skin and exposure.  Data Review  CBC Recent Labs  Lab 02/25/19 1442  WBC 5.3  HGB 10.6*  HCT 32.9*  PLT 174  MCV 96.8  MCH 31.2  MCHC 32.2  RDW 15.0   ------------------------------------------------------------------------------------------------------------------  Chemistries  Recent Labs  Lab 02/25/19 1442  NA 140  K 4.9  CL 107  CO2 25  GLUCOSE 161*  BUN 44*  CREATININE 1.46*  CALCIUM 9.2  MG 2.1   ------------------------------------------------------------------------------------------------------------------  estimated creatinine clearance is 17.1 mL/min (A) (by C-G formula based on SCr of 1.46 mg/dL (H)). ------------------------------------------------------------------------------------------------------------------ No results for input(s): TSH, T4TOTAL, T3FREE, THYROIDAB in the last 72 hours.  Invalid input(s): FREET3 Urinalysis    Component Value Date/Time   COLORURINE YELLOW (A) 07/23/2015 1051   APPEARANCEUR CLEAR (A) 07/23/2015 1051   LABSPEC 1.013 07/23/2015 1051   PHURINE 7.0 07/23/2015 1051   GLUCOSEU NEGATIVE 07/23/2015 1051   HGBUR NEGATIVE 07/23/2015 1051   BILIRUBINUR NEGATIVE 07/23/2015 1051   KETONESUR NEGATIVE 07/23/2015 1051   PROTEINUR NEGATIVE 07/23/2015 1051   NITRITE NEGATIVE 07/23/2015 1051   LEUKOCYTESUR NEGATIVE 07/23/2015 1051     Imaging results: X-rays from the office show a pronounced bone proliferation at the medial and dorsal medial aspect of the first metatarsal head along with pronounced hallux valgus metatarsus primus varus deformity.  No significant evidence of degenerative bone changes associated with osteomyelitis but likely early changes are  noted.  Assessment & Plan: We have been monitoring her for the last month or so she has made improvement since the vascular procedure was done but with the bone protruding into the wound she has very little hope of getting soft tissue and skin coverage over this area.  Initially we thought about doing a first ray amputation but because she is improved some with the wound I feel like it is optional to remove the metatarsal head and see if we can get primary closure across the area.  She may have a little bit of granular wound still left but I think we can get the wound closed at this point.  Removal of metatarsal head will allow for improvement from that standpoint.  Active Problems:   Foot infection   Family Communication: Plan discussed with patient and her son.  I explained the consent form to her today and she seems understand that but have also been over the same issues with the son on numerous occasions and spoke with him about the plans today and what the potential risk are associated with it.  He has good understanding of her needs to get this done.  Albertine Patricia M.D on 02/25/2019 at 4:47 PM  Thank you for the consult, we will follow the patient with you in the Hospital.

## 2019-02-25 NOTE — Progress Notes (Signed)
PHARMACIST - PHYSICIAN COMMUNICATION  CONCERNING:  Enoxaparin (Lovenox) for DVT Prophylaxis    RECOMMENDATION: Patient was prescribed enoxaparin 40 mg q24 hours for VTE prophylaxis.   Filed Weights   02/25/19 1424  Weight: 119 lb 7.8 oz (54.2 kg)    Body mass index is 20.51 kg/m.  Estimated Creatinine Clearance: 17.1 mL/min (A) (by C-G formula based on SCr of 1.46 mg/dL (H)).   Based on [place] patient is candidate for enoxaparin 30 mg every 24 hours based on CrCl <7ml/min  DESCRIPTION: Pharmacy has adjusted enoxaparin dose per [facility] policy.  Patient is now receiving enoxaparin 30mg  every 24 hours.  Tawnya Crook, PharmD Clinical Pharmacist  02/25/2019 3:55 PM

## 2019-02-25 NOTE — H&P (Signed)
[facility] History and Physical  Sandy Roberson [id number] DOB: 06/11/1915 DOA: 02/25/2019  Referring physician: ED  PCP: Tracie Harrier, MD   Chief Complaint:  Right foot infection  HPI: Sandy Roberson is a 85 y.o. female with past medical history of hypertension, coronary artery disease status post stent, peripheral vascular disease was sent to the hospital directly from Dr. Elvina Mattes podiatry office for management of first metatarsal phalangeal joint wound infection with ulceration.  Dr. Elvina Mattes is recommending removal of the head of the first metatarsal so patient has been sent for admission to the hospital.  Of note patientm had vascular surgery evaluation done in the past twice with angioplasty, recent visit on 01/05/2019 by [place] vascular [facility] vein specialist.  Patient denies any nausea, vomiting, abdominal pain but sometimes has trouble swallowing food.  Patient denies any diarrhea, abdominal pain.  Denies urinary urgency, frequency or dysuria.  Denies any cough, shortness of breath, fever, chills or rigors.  Denies any recent travel or sick contacts.  Patient ambulates with the help of walker at home and lives with her son at home.  Review of Systems:  All systems were reviewed and were negative unless otherwise mentioned in the HPI  Past Medical History:  Diagnosis Date  . Coronary artery disease   . Hypertension   . Renal disorder    Past Surgical History:  Procedure Laterality Date  . CORONARY STENT PLACEMENT    . ESOPHAGOGASTRODUODENOSCOPY (EGD) WITH PROPOFOL N/A 10/20/2018   Procedure: ESOPHAGOGASTRODUODENOSCOPY (EGD) WITH PROPOFOL;  Surgeon: Lucilla Lame, MD;  Location: [facility] ENDOSCOPY;  Service: Endoscopy;  Laterality: N/A;  . FRACTURE SURGERY    . LOWER EXTREMITY ANGIOGRAPHY Right 11/24/2018   Procedure: LOWER EXTREMITY ANGIOGRAPHY;  Surgeon: Algernon Huxley, MD;  Location: [place] CV LAB;  Service: Cardiovascular;  Laterality: Right;  . LOWER  EXTREMITY ANGIOGRAPHY Right 01/05/2019   Procedure: LOWER EXTREMITY ANGIOGRAPHY;  Surgeon: Algernon Huxley, MD;  Location: [place] CV LAB;  Service: Cardiovascular;  Laterality: Right;  . STENT PLACEMENT RT URETER ([place] HX)     since been removed     Social History:  reports that she has never smoked. She has never used smokeless tobacco. She reports that she does not drink alcohol or use drugs.  Allergies  Allergen Reactions  . Penicillins Rash    Did it involve swelling of the face/tongue/throat, SOB, or low BP? No Did it involve sudden or severe rash/hives, skin peeling, or any reaction on the inside of your mouth or nose? No Did you need to seek medical attention at a hospital or doctor's office? No When did it last happen?10 Years If all above answers are "NO", may proceed with cephalosporin use.    Family History  Problem Relation Age of Onset  . Heart failure Neg Hx      Prior to Admission medications   Medication Sig Start Date End Date Taking? Authorizing Provider  ASPIRIN 81 PO Take 81 mg by mouth daily.  07/12/10   [provider]  atorvastatin (LIPITOR) 10 MG tablet Take 1 tablet (10 mg total) by mouth daily. 01/05/19 01/05/20  Algernon Huxley, MD  carvedilol (COREG) 3.125 MG tablet Take 1 tablet (3.125 mg total) by mouth 2 (two) times daily with a meal. 09/25/14   Gladstone Lighter, MD  Cholecalciferol (VITAMIN D3) 50 MCG (2000 UT) TABS Take 2,000 Units by mouth every other day.    [provider]  clopidogrel (PLAVIX) 75 MG tablet Take 1 tablet (75  mg total) by mouth daily. 11/24/18   Algernon Huxley, MD  methimazole (TAPAZOLE) 5 MG tablet Take 5 mg by mouth daily.     [provider]  trimethoprim (TRIMPEX) 100 MG tablet Take 100 mg by mouth daily.    [provider]    Physical Exam: There were no vitals filed for this visit. Wt Readings from Last 3 Encounters:  01/26/19 53.5 kg  01/05/19 53.5 kg  01/02/19 53.5 kg   There is  no height or weight on file to calculate BMI.  General: Thinly built , not in obvious distress, hard of hearing HENT: Normocephalic, pupils equally reacting to light and accommodation.  No scleral pallor or icterus noted. Oral mucosa is moist.  Chest:  Clear breath sounds.  Diminished breath sounds bilaterally. No crackles or wheezes.  CVS: S1 &S2 heard. No murmur.  Regular rate and rhythm. Abdomen: Soft, nontender, nondistended.  Bowel sounds are heard.  Liver is not palpable, no abdominal mass palpated Extremities: No cyanosis, clubbing or edema.  Peripheral pulses are palpable.  Right foot with an ulcerated area of exposed bone over the region of first metatarsal phalangeal joint covered with dressing. Psych: Alert, awake and oriented, normal mood CNS:  No cranial nerve deficits.  Power equal in all extremities.   No cerebellar signs.   Skin: Warm and dry.  Ulceration on the right foot.  Labs on Admission:   CBC: No results for input(s): WBC, NEUTROABS, HGB, HCT, MCV, PLT in the last 168 hours.  Basic Metabolic Panel: No results for input(s): NA, K, CL, CO2, GLUCOSE, BUN, CREATININE, CALCIUM, MG, PHOS in the last 168 hours.  Liver Function Tests: No results for input(s): AST, ALT, ALKPHOS, BILITOT, PROT, ALBUMIN in the last 168 hours. No results for input(s): LIPASE, AMYLASE in the last 168 hours. No results for input(s): AMMONIA in the last 168 hours.  Cardiac Enzymes: No results for input(s): CKTOTAL, CKMB, CKMBINDEX, TROPONINI in the last 168 hours.  BNP (last 3 results) No results for input(s): BNP in the last 8760 hours.  ProBNP (last 3 results) No results for input(s): PROBNP in the last 8760 hours.  CBG: No results for input(s): GLUCAP in the last 168 hours.  Lipase  No results found for: LIPASE   Urinalysis    Component Value Date/Time   COLORURINE YELLOW (A) 07/23/2015 1051   APPEARANCEUR CLEAR (A) 07/23/2015 1051   LABSPEC 1.013 07/23/2015 1051   PHURINE 7.0  07/23/2015 1051   GLUCOSEU NEGATIVE 07/23/2015 1051   HGBUR NEGATIVE 07/23/2015 1051   BILIRUBINUR NEGATIVE 07/23/2015 1051   KETONESUR NEGATIVE 07/23/2015 1051   PROTEINUR NEGATIVE 07/23/2015 1051   NITRITE NEGATIVE 07/23/2015 1051   LEUKOCYTESUR NEGATIVE 07/23/2015 1051     Drugs of Abuse  No results found for: LABOPIA, COCAINSCRNUR, LABBENZ, AMPHETMU, THCU, LABBARB    Radiological Exams on Admission: No results found.  EKG: No recent EKG available.  EKG from 10/19/2018 showed normal sinus rhythm  Assessment/Plan Active Problems:   Foot infection  Right first toe metatarsophalangeal joint ulceration with underlying osteomyelitis.  History of peripheral vascular disease status post angioplasty and stent.  Seen by Dr. Elvina Mattes, podiatry and sent here for resection of the metatarsal head.   Podiatry recommended vancomycin IV, will continue with that.  No need for MRI at this time.  Plan for resection tomorrow.  Essential hypertension.  Continue Coreg from home.  Peripheral arterial disease.  Continue aspirin, Plavix Lipitor  Hyperthyroidism.   On methimazole.  Appears stable.  History of UTI on suppressive trimethoprim will continue with that.  Coronary artery disease status post stent in the past.  Continue aspirin Plavix statins.  Consultant:  Podiatry, Dr.[name]  Code Status: Full code.  Confirmed with the patient's son at bedside  DVT Prophylaxis: lovenox  Antibiotics: Vancomycin IV  Family Communication:  Patients' condition and plan of care including tests being ordered have been discussed with the patient and son who indicate understanding and agree with the plan.  Disposition Plan: Home  Severity of Illness: The appropriate patient status for this patient is INPATIENT. Inpatient status is judged to be reasonable and necessary in order to provide the required intensity of service to ensure the patient's safety. The patient's presenting symptoms, physical exam  findings, and initial radiographic and laboratory data in the context of their chronic comorbidities is felt to place them at high risk for further clinical deterioration. Furthermore, it is not anticipated that the patient will be medically stable for discharge from the hospital within 2 midnights of admission. I certify that at the point of admission it is my clinical judgment that the patient will require inpatient hospital care spanning beyond 2 midnights from the point of admission due to high intensity of service, high risk for further deterioration and high frequency of surveillance required includes amputation of the digit.  Signed, Flora Lipps, MD [facility] 02/25/2019

## 2019-02-26 ENCOUNTER — Encounter: Payer: Self-pay | Admitting: *Deleted

## 2019-02-26 ENCOUNTER — Encounter
Admission: AD | Disposition: A | Discharge status: Home Health | Payer: Self-pay | Source: Ambulatory Visit | Attending: Internal Medicine

## 2019-02-26 ENCOUNTER — Inpatient Hospital Stay: Payer: Medicare Other | Admitting: Anesthesiology

## 2019-02-26 ENCOUNTER — Ambulatory Visit: Admission: RE | Admit: 2019-02-26 | Payer: Medicare Other | Source: Home / Self Care | Admitting: Podiatry

## 2019-02-26 ENCOUNTER — Other Ambulatory Visit: Payer: Self-pay

## 2019-02-26 DIAGNOSIS — L089 Local infection of the skin and subcutaneous tissue, unspecified: Secondary | ICD-10-CM

## 2019-02-26 HISTORY — PX: TRANSMETATARSAL AMPUTATION: SHX6197

## 2019-02-26 LAB — BASIC METABOLIC PANEL
Anion gap: 5 (ref 5–15)
BUN: 38 mg/dL — ABNORMAL HIGH (ref 8–23)
CO2: 23 mmol/L (ref 22–32)
Calcium: 8.2 mg/dL — ABNORMAL LOW (ref 8.9–10.3)
Chloride: 112 mmol/L — ABNORMAL HIGH (ref 98–111)
Creatinine, Ser: 1.26 mg/dL — ABNORMAL HIGH (ref 0.44–1.00)
GFR calc Af Amer: 40 mL/min — ABNORMAL LOW (ref >60)
GFR calc non Af Amer: 35 mL/min — ABNORMAL LOW (ref >60)
Glucose, Bld: 77 mg/dL (ref 70–99)
Potassium: 4.2 mmol/L (ref 3.5–5.1)
Sodium: 140 mmol/L (ref 135–145)

## 2019-02-26 LAB — CBC
HCT: 27.9 % — ABNORMAL LOW (ref 36.0–46.0)
Hemoglobin: 9 g/dL — ABNORMAL LOW (ref 12.0–15.0)
MCH: 30.7 pg (ref 26.0–34.0)
MCHC: 32.3 g/dL (ref 30.0–36.0)
MCV: 95.2 fL (ref 80.0–100.0)
Platelets: 153 10*3/uL (ref 150–400)
RBC: 2.93 MIL/uL — ABNORMAL LOW (ref 3.87–5.11)
RDW: 15.1 % (ref 11.5–15.5)
WBC: 5.4 10*3/uL (ref 4.0–10.5)
nRBC: 0 % (ref 0.0–0.2)

## 2019-02-26 LAB — SARS CORONAVIRUS 2 (TAT 6-24 HRS): SARS Coronavirus 2: NEGATIVE

## 2019-02-26 LAB — SEDIMENTATION RATE: Sed Rate: 56 mm/hr — ABNORMAL HIGH (ref 0–30)

## 2019-02-26 LAB — SURGICAL PCR SCREEN
MRSA, PCR: NEGATIVE
Staphylococcus aureus: NEGATIVE

## 2019-02-26 LAB — C-REACTIVE PROTEIN: CRP: <0.8 mg/dL (ref <1.0)

## 2019-02-26 SURGERY — AMPUTATION, FOOT, TRANSMETATARSAL
Anesthesia: General | Site: Foot | Laterality: Right

## 2019-02-26 MED ORDER — PROPOFOL 500 MG/50ML IV EMUL
INTRAVENOUS | Status: DC | PRN
  Administered 2019-02-26: 25 ug/kg/min via INTRAVENOUS

## 2019-02-26 MED ORDER — ONDANSETRON HCL 4 MG/2ML IJ SOLN: 4.0000 mg | Freq: Once | INTRAMUSCULAR | Status: DC | PRN

## 2019-02-26 MED ORDER — LIDOCAINE HCL (PF) 1 % IJ SOLN
INTRAMUSCULAR | Status: AC
  Filled 2019-02-26: qty 30

## 2019-02-26 MED ORDER — FENTANYL CITRATE (PF) 100 MCG/2ML IJ SOLN: 25.0000 ug | INTRAMUSCULAR | Status: DC | PRN

## 2019-02-26 MED ORDER — BUPIVACAINE HCL (PF) 0.5 % IJ SOLN
INTRAMUSCULAR | Status: AC
  Filled 2019-02-26: qty 30

## 2019-02-26 MED ORDER — PROPOFOL 10 MG/ML IV BOLUS
INTRAVENOUS | Status: DC | PRN
  Administered 2019-02-26: 20 mg via INTRAVENOUS

## 2019-02-26 MED ORDER — LIDOCAINE HCL (PF) 2 % IJ SOLN
INTRAMUSCULAR | Status: DC | PRN
  Administered 2019-02-26: 50 mg via INTRADERMAL

## 2019-02-26 MED ORDER — SODIUM CHLORIDE 0.9 % IV SOLN
INTRAVENOUS | Status: DC | PRN
  Administered 2019-02-26: 30 mL via INTRAVENOUS

## 2019-02-26 MED ORDER — BUPIVACAINE HCL (PF) 0.5 % IJ SOLN
INTRAMUSCULAR | Status: DC | PRN
  Administered 2019-02-26: 5 mL

## 2019-02-26 MED ORDER — FENTANYL CITRATE (PF) 100 MCG/2ML IJ SOLN
INTRAMUSCULAR | Status: AC
  Filled 2019-02-26: qty 2

## 2019-02-26 MED ORDER — FENTANYL CITRATE (PF) 100 MCG/2ML IJ SOLN
INTRAMUSCULAR | Status: DC | PRN
  Administered 2019-02-26: 25 ug via INTRAVENOUS

## 2019-02-26 MED ORDER — PROPOFOL 10 MG/ML IV BOLUS
INTRAVENOUS | Status: AC
  Filled 2019-02-26: qty 20

## 2019-02-26 MED ORDER — LABETALOL HCL 5 MG/ML IV SOLN
5.0000 mg | INTRAVENOUS | Status: DC | PRN
  Administered 2019-02-27: 5 mg via INTRAVENOUS
  Filled 2019-02-26: qty 4

## 2019-02-26 MED ORDER — LACTATED RINGERS IV SOLN
INTRAVENOUS | Status: AC
  Administered 2019-02-26 – 2019-02-27 (×2): via INTRAVENOUS

## 2019-02-26 MED ORDER — LIDOCAINE HCL (PF) 1 % IJ SOLN
INTRAMUSCULAR | Status: DC | PRN
  Administered 2019-02-26: 5 mL

## 2019-02-26 SURGICAL SUPPLY — 32 items
BAG COUNTER SPONGE EZ (MISCELLANEOUS) ×2 IMPLANT
BLADE SAW GIGLI 510 (BLADE) ×2 IMPLANT
BNDG COHESIVE 4X5 TAN STRL (GAUZE/BANDAGES/DRESSINGS) ×2 IMPLANT
BNDG ELASTIC 4X5.8 VLCR NS LF (GAUZE/BANDAGES/DRESSINGS) ×2 IMPLANT
BNDG ESMARK 4X12 TAN STRL LF (GAUZE/BANDAGES/DRESSINGS) ×2 IMPLANT
BNDG GAUZE 4.5X4.1 6PLY STRL (MISCELLANEOUS) ×2 IMPLANT
BNDG STRETCH 4X75 STRL LF (GAUZE/BANDAGES/DRESSINGS) ×2 IMPLANT
CANISTER SUCT 1200ML W/VALVE (MISCELLANEOUS) ×2 IMPLANT
COVER WAND RF STERILE (DRAPES) ×2 IMPLANT
CUFF TOURN SGL QUICK 12 (TOURNIQUET CUFF) IMPLANT
CUFF TOURN SGL QUICK 18X4 (TOURNIQUET CUFF) IMPLANT
DRAIN PENROSE 1/4X12 LTX STRL (WOUND CARE) ×2 IMPLANT
DURAPREP 26ML APPLICATOR (WOUND CARE) ×2 IMPLANT
ELECT REM PT RETURN 9FT ADLT (ELECTROSURGICAL) ×2
ELECTRODE REM PT RTRN 9FT ADLT (ELECTROSURGICAL) ×1 IMPLANT
GAUZE SPONGE 4X4 12PLY STRL (GAUZE/BANDAGES/DRESSINGS) ×2 IMPLANT
GAUZE XEROFORM 1X8 LF (GAUZE/BANDAGES/DRESSINGS) ×2 IMPLANT
GLOVE BIO SURGEON STRL SZ8 (GLOVE) ×2 IMPLANT
GLOVE INDICATOR 8.0 STRL GRN (GLOVE) ×2 IMPLANT
GOWN STRL REUS W/ TWL LRG LVL3 (GOWN DISPOSABLE) ×2 IMPLANT
GOWN STRL REUS W/TWL LRG LVL3 (GOWN DISPOSABLE) ×2
HANDLE YANKAUER SUCT BULB TIP (MISCELLANEOUS) ×2 IMPLANT
KIT TURNOVER KIT A (KITS) ×2 IMPLANT
LABEL OR SOLS (LABEL) ×2 IMPLANT
NDL SAFETY ECLIPSE 18X1.5 (NEEDLE) ×1 IMPLANT
NEEDLE HYPO 18GX1.5 SHARP (NEEDLE) ×1
NS IRRIG 500ML POUR BTL (IV SOLUTION) ×2 IMPLANT
PACK EXTREMITY ARMC (MISCELLANEOUS) ×2 IMPLANT
PAD ABD DERMACEA PRESS 5X9 (GAUZE/BANDAGES/DRESSINGS) ×4 IMPLANT
SPONGE LAP 18X18 RF (DISPOSABLE) ×2 IMPLANT
STOCKINETTE M/LG 89821 (MISCELLANEOUS) ×2 IMPLANT
SYR 10ML LL (SYRINGE) ×2 IMPLANT

## 2019-02-26 NOTE — TOC Initial Note (Signed)
Transition of Care [facility]) - Initial/Assessment Note    Patient Details  Name: Sandy Roberson MRN: [id number] Date of Birth: Jul 23, 1915  [facility] CM/SW Contact:    Beverly Sessions, RN Phone Number: 02/26/2019, 3:32 PM  Clinical Narrative:                 Patient off the floor for procedure Per Corene Cornea with [place] patient open with RN        Patient Goals and CMS Choice        Expected Discharge Plan and Services                                                Prior Living Arrangements/Services                       Activities of Daily Living Home Assistive Devices/Equipment: Hearing aid, [place] (specify type), [place] ADL Screening (condition at time of admission) Patient's cognitive ability adequate to safely complete daily activities?: Yes Is the patient deaf or have difficulty hearing?: Yes Does the patient have difficulty seeing, even when wearing glasses/contacts?: No Does the patient have difficulty concentrating, remembering, or making decisions?: No Patient able to express need for assistance with ADLs?: Yes Does the patient have difficulty dressing or bathing?: No Independently performs ADLs?: Yes (appropriate for developmental age) Does the patient have difficulty walking or climbing stairs?: Yes Weakness of Legs: Both Weakness of Arms/Hands: None  Permission Sought/Granted                  Emotional Assessment              Admission diagnosis:  Osteomyelitis rt foot w planned amputation Patient Active Problem List   Diagnosis Date Noted  . Foot infection 02/25/2019  . Cellulitis 10/29/2018  . Food impaction of esophagus   . Stricture and stenosis of esophagus   . Esophageal obstruction due to food impaction 10/19/2018  . Atherosclerosis of native arteries of the extremities with ulceration ([place]) 03/28/2018  . Chronic kidney disease 03/11/2018  . CKD (chronic kidney disease) stage 3,  GFR 30-59 ml/min 03/11/2018  . Coronary artery disease 03/11/2018  . Hyperlipidemia 03/11/2018  . Hypertension 03/11/2018  . Thyroid disease 03/11/2018  . SOB (shortness of breath) 04/05/2016  . Flu vaccine need 02/17/2016  . H/O dehydration 08/12/2015  . ITP (idiopathic thrombocytopenic purpura) 09/25/2014  . ARF (acute renal failure) ([place]) 09/25/2014  . Hypokalemia 09/25/2014  . Acute diastolic CHF (congestive heart failure) ([place]) 09/25/2014  . Sepsis due to urinary tract infection ([place]) 09/19/2014  . Hyponatremia 09/19/2014  . Kidney stone 11/12/2012  . Urge incontinence 11/12/2012  . Urinary tract infection, site not specified 11/12/2012   PCP:  Tracie Harrier, MD Pharmacy:   [id number] - [place], [place] - [address] [place] [address] Phone: [phone] Fax: [phone]     Social Determinants of Health (SDOH) Interventions    Readmission Risk Interventions No flowsheet data found.

## 2019-02-26 NOTE — H&P (Signed)
H and P has been reviewed and no changes are noted.  

## 2019-02-26 NOTE — Anesthesia Procedure Notes (Signed)
Date/Time: 02/26/2019 2:00 PM Performed by: Allean Found, CRNA Pre-anesthesia Checklist: Emergency Drugs available, Suction available, Patient identified, Patient being monitored and Timeout performed Oxygen Delivery Method: Nasal cannula Placement Confirmation: positive ETCO2

## 2019-02-26 NOTE — Transfer of Care (Signed)
Immediate Anesthesia Transfer of Care Note  Patient: Lateia Ammons  Procedure(s) Performed: 1st METATARSAL RESECTION (Right Foot)  Patient Location: PACU  Anesthesia Type:MAC  Level of Consciousness: awake, alert  and oriented  Airway & Oxygen Therapy: Patient Spontanous Breathing and Patient connected to nasal cannula oxygen  Post-op Assessment: Report given to RN and Post -op Vital signs reviewed and stable  Post vital signs: Reviewed and stable  Last Vitals:  Vitals Value Taken Time  BP 156/63 02/26/19 1455  Temp 36.3 C 02/26/19 1451  Pulse 60 02/26/19 1500  Resp 15 02/26/19 1500  SpO2 100 % 02/26/19 1500  Vitals shown include unvalidated device data.  Last Pain:  Vitals:   02/26/19 1451  TempSrc:   PainSc: 0-No pain      Patients Stated Pain Goal: 0 (92/33/00 7622)  Complications: No apparent anesthesia complications

## 2019-02-26 NOTE — Anesthesia Post-op Follow-up Note (Signed)
Anesthesia QCDR form completed.        

## 2019-02-26 NOTE — Anesthesia Preprocedure Evaluation (Signed)
Anesthesia Evaluation  Patient identified by MRN, date of birth, ID band Patient awake    Reviewed: Allergy & Precautions, NPO status , Patient's Chart, lab work & pertinent test results  History of Anesthesia Complications Negative for: history of anesthetic complications  Airway Mallampati: III       Dental   Pulmonary neg sleep apnea, neg COPD, Not current smoker,           Cardiovascular hypertension, Pt. on medications + CAD and +CHF  (-) Past MI (-) dysrhythmias (-) Valvular Problems/Murmurs     Neuro/Psych neg Seizures    GI/Hepatic Neg liver ROS, neg GERD  ,  Endo/Other  neg diabetes  Renal/GU Renal InsufficiencyRenal disease     Musculoskeletal   Abdominal   Peds  Hematology   Anesthesia Other Findings   Reproductive/Obstetrics                             Anesthesia Physical Anesthesia Plan  ASA: III  Anesthesia Plan: General   Post-op Pain Management:    Induction:   PONV Risk Score and Plan: 3 and Ondansetron, Dexamethasone and Treatment may vary due to age or medical condition  Airway Management Planned:   Additional Equipment:   Intra-op Plan:   Post-operative Plan:   Informed Consent: I have reviewed the patients History and Physical, chart, labs and discussed the procedure including the risks, benefits and alternatives for the proposed anesthesia with the patient or authorized representative who has indicated his/her understanding and acceptance.       Plan Discussed with:   Anesthesia Plan Comments:         Anesthesia Quick Evaluation

## 2019-02-26 NOTE — Op Note (Signed)
Operative note   Surgeon: Dr. Albertine Patricia, DPM.    Assistant: None    Preop diagnosis: Osteomyelitis secondary to chronically exposed first metatarsal head right foot which is secondary to chronic ulceration over the first metatarsal head    Postop diagnosis: Same    Procedure:   1.  Resection first metatarsal head right foot           EBL: 10 cc    Anesthesia:IV sedation delivered by the anesthesia team.  I injected 10 cc of lidocaine and Marcaine mixed at the base of the great toe    Hemostasis: None    Specimen: 1.  Bone culture from exposed first metatarsal head 2.  Metatarsal head with proximal margin marked to check for clear margins of infection    Complications: None    Operative indications: Chronically exposed large section of the first metatarsal head medially due to pronounced ulceration loss of skin and subcutaneous tissue and soft tissue coverage over the bone.  Also due to peripheral arterial disease.    Procedure:  Patient was brought into the OR and placed on the operating table in thesupine position. After anesthesia was obtained theright lower extremity was prepped and draped in usual sterile fashion.  Operative Report: At this time attention was directed to the first metatarsal head.  2 semielliptical incisions were made staying close to the exposed margins of skin.  Really no skin was excised along the area except a little bit proximal but distal to the exposed metatarsal head.  This ellipse was then cleaned up and the metatarsal head was dissected away from the skin soft tissue and capsular tissue.  The distal shaft was exposed and a sagittal saw was used to make a osteotomy through the stable bone distally.  The head metatarsal was then removed.  A portion of this medial head was sent for culture and the rest of it was sent to pathology with the proximal margin marked for evaluation.  There is an copiously irrigated with sterile saline.  4-0 Vicryl was used  to suture capsular tissue as well as possible.  Due to erosion of the nature of the wound with minimal capsule was present in the metatarsal head region.  Skin was then closed primarily with 3-0 nylon simple interrupted sutures.  Sterile dressings were placed across wound consisting of Xeroform gauze wet-to-dry saline 4 x 4's conformer and Kling.  These were put on very loosely.      Patient tolerated the procedure and anesthesia well.  Was transported from the OR to the PACU with all vital signs stable and vascular status intact. To be discharged per routine protocol.  Will follow up in approximately 1 week in the outpatient clinic.

## 2019-02-26 NOTE — Progress Notes (Addendum)
PROGRESS NOTE  Sandy Roberson [id number] DOB: 11/11/1915 DOA: 02/25/2019 PCP: Tracie Harrier, MD  HPI/Recap of past 24 hours:  Sandy Roberson is a 85 y.o. female with past medical history of hypertension, coronary artery disease status post stent, peripheral vascular disease was sent to the hospital directly from Dr. Elvina Mattes podiatry office for management of first metatarsal phalangeal joint wound infection with ulceration.  Dr. Elvina Mattes is recommending removal of the head of the first metatarsal so patient has been sent for admission to the hospital.  Of note patientm had vascular surgery evaluation done in the past twice with angioplasty, recent visit on 01/05/2019 by [facility] specialist.  Patient denies any nausea, vomiting, abdominal pain but sometimes has trouble swallowing food.  Patient denies any diarrhea, abdominal pain.  Denies urinary urgency, frequency or dysuria.  Denies any cough, shortness of breath, fever, chills or rigors.  Denies any recent travel or sick contacts.  Patient ambulates with the help of walker at home and lives with her son at home.  02/26/19: Seen and examined at her bedside. Reports some pain in her R 1st metatarsal overnight which improved after taking pain medication. No other complaints. Plan for procedure in the OR with podiatry today.  Assessment/Plan: Active Problems:   Foot infection  Right first toe metatarsophalangeal joint ulceration with underlying osteomyelitis.   History of peripheral vascular disease status post angioplasty and stent.  Seen by Dr. Elvina Mattes, podiatry and sent here for resection of the metatarsal head.   Podiatry recommended vancomycin IV Continuye IV vancomycin Procedure in the OR planned by podiatry today  Uncontrolled HTN BP not at goal Resume her home antihypertensives when no longer NPO. On coreg at home Continue to monitor VS  Hyperthyroidism Continue methimazole when no longer NPO   PVD/CAD Continue Lipitor and plavix Restart plavix if ok with podiatry post surgery  CKD 3 Appears to be at her baseline  Cr 1.26 with GFR 35 Avoid nephrotoxins like NSAIDS Repeat BMP in the am  Chronic dCHF 2D echo done in 2016 normal LVEF w Grade 1DD Continue strict I&O and daily weights  Consultant:  Podiatry, Dr.[name]  Code Status: Full code.  Confirmed with the patient's son at bedside  DVT Prophylaxis: lovenox SQ daily  Antibiotics: Vancomycin IV  Family Communication: None at bedside  Disposition Plan: Home when podiatry signs off.   Objective: Vitals:   02/26/19 0536 02/26/19 0600 02/26/19 0915 02/26/19 1239  BP: (!) 148/62  (!) 156/73 (!) 164/75  Pulse: 76  62 74  Resp: 20  18 18   Temp: 97.9 F (36.6 C)  97.6 F (36.4 C) (!) 97.1 F (36.2 C)  TempSrc: Oral  Oral Tympanic  SpO2: 97%  98% 98%  Weight:  55.1 kg  55 kg  Height:  5\' 2"  (1.575 m)  5\' 2"  (1.575 m)    Intake/Output Summary (Last 24 hours) at 02/26/2019 1328 Last data filed at 02/26/2019 0810 Gross per 24 hour  Intake 1079.67 ml  Output 500 ml  Net 579.67 ml   Filed Weights   02/25/19 1424 02/26/19 0600 02/26/19 1239  Weight: 54.2 kg 55.1 kg 55 kg    Exam:  . General: 85 y.o. year-old female pleasant, well developed well nourished in no acute distress.  Alert and interactive. . Cardiovascular: Regular rate and rhythm with no rubs or gallops.  No thyromegaly or JVD noted.   Marland Kitchen Respiratory: Clear to auscultation with no wheezes or rales. Good inspiratory effort. . Abdomen:  Soft nontender nondistended with normal bowel sounds x4 quadrants. . Musculoskeletal: Trace lower extremity edema. 2/4 pulses in all 4 extremities. Ulcerative lesion affecting R 1st metatarsal phallinge . Psychiatry: Mood is appropriate for condition and setting   Data Reviewed: CBC: Recent Labs  Lab 02/25/19 1442 02/26/19 0418  WBC 5.3 5.4  HGB 10.6* 9.0*  HCT 32.9* 27.9*  MCV 96.8 95.2  PLT 174  0000000   Basic Metabolic Panel: Recent Labs  Lab 02/25/19 1442 02/26/19 0418  NA 140 140  K 4.9 4.2  CL 107 112*  CO2 25 23  GLUCOSE 161* 77  BUN 44* 38*  CREATININE 1.46* 1.26*  CALCIUM 9.2 8.2*  MG 2.1  --    GFR: Estimated Creatinine Clearance: 18.3 mL/min (A) (by C-G formula based on SCr of 1.26 mg/dL (H)). Liver Function Tests: No results for input(s): AST, ALT, ALKPHOS, BILITOT, PROT, ALBUMIN in the last 168 hours. No results for input(s): LIPASE, AMYLASE in the last 168 hours. No results for input(s): AMMONIA in the last 168 hours. Coagulation Profile: No results for input(s): INR, PROTIME in the last 168 hours. Cardiac Enzymes: No results for input(s): CKTOTAL, CKMB, CKMBINDEX, TROPONINI in the last 168 hours. BNP (last 3 results) No results for input(s): PROBNP in the last 8760 hours. HbA1C: Recent Labs    02/25/19 1442  HGBA1C 5.4   CBG: No results for input(s): GLUCAP in the last 168 hours. Lipid Profile: No results for input(s): CHOL, HDL, LDLCALC, TRIG, CHOLHDL, LDLDIRECT in the last 72 hours. Thyroid Function Tests: No results for input(s): TSH, T4TOTAL, FREET4, T3FREE, THYROIDAB in the last 72 hours. Anemia Panel: No results for input(s): VITAMINB12, FOLATE, FERRITIN, TIBC, IRON, RETICCTPCT in the last 72 hours. Urine analysis:    Component Value Date/Time   COLORURINE YELLOW (A) 07/23/2015 1051   APPEARANCEUR CLEAR (A) 07/23/2015 1051   LABSPEC 1.013 07/23/2015 1051   PHURINE 7.0 07/23/2015 1051   GLUCOSEU NEGATIVE 07/23/2015 1051   HGBUR NEGATIVE 07/23/2015 1051   BILIRUBINUR NEGATIVE 07/23/2015 1051   KETONESUR NEGATIVE 07/23/2015 1051   PROTEINUR NEGATIVE 07/23/2015 1051   NITRITE NEGATIVE 07/23/2015 1051   LEUKOCYTESUR NEGATIVE 07/23/2015 1051   Sepsis Labs: @LABRCNTIP (procalcitonin:4,lacticidven:4)  ) Recent Results (from the past 240 hour(s))  SARS CORONAVIRUS 2 (TAT 6-24 HRS) Nasopharyngeal Nasopharyngeal Swab     Status: None    Collection Time: 02/25/19  4:04 PM   Specimen: Nasopharyngeal Swab  Result Value Ref Range Status   SARS Coronavirus 2 NEGATIVE NEGATIVE Final    Comment: (NOTE) SARS-CoV-2 target nucleic acids are NOT DETECTED. The SARS-CoV-2 RNA is generally detectable in upper and lower respiratory specimens during the acute phase of infection. Negative results do not preclude SARS-CoV-2 infection, do not rule out co-infections with other pathogens, and should not be used as the sole basis for treatment or other patient management decisions. Negative results must be combined with clinical observations, patient history, and epidemiological information. The expected result is Negative. Fact Sheet for Patients: SugarRoll.be Fact Sheet for Healthcare Providers: https://www.woods-mathews.com/ This test is not yet approved or cleared by the [place] FDA and  has been authorized for detection and/or diagnosis of SARS-CoV-2 by FDA under an Emergency Use Authorization (EUA). This EUA will remain  in effect (meaning this test can be used) for the duration of the COVID-19 declaration under Section 56 4(b)(1) of the Act, 21 U.S.C. section 360bbb-3(b)(1), unless the authorization is terminated or revoked sooner. Performed at [facility], [place]  [address] [place], [place] [address]   Surgical pcr screen     Status: None   Collection Time: 02/26/19  5:56 AM   Specimen: Nasal Mucosa; Nasal Swab  Result Value Ref Range Status   MRSA, PCR NEGATIVE NEGATIVE Final   Staphylococcus aureus NEGATIVE NEGATIVE Final    Comment: (NOTE) The Xpert SA Assay (FDA approved for NASAL specimens in patients 98 years of age and older), is one component of a comprehensive surveillance program. It is not intended to diagnose infection nor to guide or monitor treatment. Performed at [facility], [address], [place], [place] [address]       Studies: No  results found.  Scheduled Meds: . [MAR Hold] aspirin EC  81 mg Oral Daily  . [MAR Hold] atorvastatin  10 mg Oral Daily  . [MAR Hold] carvedilol  3.125 mg Oral BID WC  . [MAR Hold] cholecalciferol  2,000 Units Oral QODAY  . [MAR Hold] clopidogrel  75 mg Oral Daily  . [MAR Hold] enoxaparin (LOVENOX) injection  30 mg Subcutaneous Q24H  . [MAR Hold] methimazole  5 mg Oral Daily  . [MAR Hold] trimethoprim  100 mg Oral Daily    Continuous Infusions: . sodium chloride 75 mL/hr at 02/26/19 0619  . [MAR Hold] sodium chloride 30 mL (02/26/19 1042)     LOS: 1 day     Kayleen Memos, MD [facility] Pager [phone]  If 7PM-7AM, please contact night-coverage [email or web address] Password [email or web address] 02/26/2019, 1:28 PM

## 2019-02-26 NOTE — Anesthesia Postprocedure Evaluation (Signed)
Anesthesia Post Note  Patient: Film/video editor  Procedure(s) Performed: 1st METATARSAL RESECTION (Right Foot)  Patient location during evaluation: PACU Anesthesia Type: General Level of consciousness: awake and alert Pain management: pain level controlled Vital Signs Assessment: post-procedure vital signs reviewed and stable Respiratory status: spontaneous breathing and respiratory function stable Cardiovascular status: stable Anesthetic complications: no     Last Vitals:  Vitals:   02/26/19 1451 02/26/19 1459  BP: (!) 156/63   Pulse: 66 64  Resp: 18 (!) 23  Temp: (!) 36.3 C   SpO2: 94% 100%    Last Pain:  Vitals:   02/26/19 1451  TempSrc:   PainSc: 0-No pain                 Amarilis Belflower K

## 2019-02-27 ENCOUNTER — Encounter: Payer: Self-pay | Admitting: Podiatry

## 2019-02-27 LAB — BASIC METABOLIC PANEL
Anion gap: 8 (ref 5–15)
BUN: 34 mg/dL — ABNORMAL HIGH (ref 8–23)
CO2: 21 mmol/L — ABNORMAL LOW (ref 22–32)
Calcium: 8.4 mg/dL — ABNORMAL LOW (ref 8.9–10.3)
Chloride: 109 mmol/L (ref 98–111)
Creatinine, Ser: 1.18 mg/dL — ABNORMAL HIGH (ref 0.44–1.00)
GFR calc Af Amer: 44 mL/min — ABNORMAL LOW (ref >60)
GFR calc non Af Amer: 38 mL/min — ABNORMAL LOW (ref >60)
Glucose, Bld: 77 mg/dL (ref 70–99)
Potassium: 4.5 mmol/L (ref 3.5–5.1)
Sodium: 138 mmol/L (ref 135–145)

## 2019-02-27 LAB — CBC
HCT: 28.3 % — ABNORMAL LOW (ref 36.0–46.0)
Hemoglobin: 9.4 g/dL — ABNORMAL LOW (ref 12.0–15.0)
MCH: 31.1 pg (ref 26.0–34.0)
MCHC: 33.2 g/dL (ref 30.0–36.0)
MCV: 93.7 fL (ref 80.0–100.0)
Platelets: 151 10*3/uL (ref 150–400)
RBC: 3.02 MIL/uL — ABNORMAL LOW (ref 3.87–5.11)
RDW: 15.2 % (ref 11.5–15.5)
WBC: 6.5 10*3/uL (ref 4.0–10.5)
nRBC: 0 % (ref 0.0–0.2)

## 2019-02-27 NOTE — TOC Progression Note (Signed)
Transition of Care [facility]) - Progression Note    Patient Details  Name: Sandy Roberson MRN: [id number] Date of Birth: 25-Aug-1915  Transition of [facility] CM/SW Contact  Latanya Maudlin, RN Phone Number: 02/27/2019, 9:26 AM  Clinical Narrative: TOC consulted to assist with disposition needs. Patient is from home with her son. Patient is mostly independent at baseline with activities of daily living. Patient uses a rolling walker and bedside commode at home. Patient has had an operation and is now using her post op show during ambulation. Podiatry MD thinks home health PT could be beneficial. Patient is active with home health for nursing care and patient is comfortable adding PT to her home care services with [facility]. Notified Jason at [facility] of situation and anticipate home health orders at time of discharge. Son is able to transport. There are no other identified TOC team needs.         Expected Discharge Plan: [place] Barriers to Discharge: Continued Medical Work up  Expected Discharge Plan and Services Expected Discharge Plan: [place]   Discharge Planning Services: CM Consult Post Acute Care Choice: Home Health, Resumption of Svcs/PTA Provider Living arrangements for the past 2 months: [place]: PT, RN [facility] Agency: [place] ([facility]) Date [place]: 02/27/19 Time [place]: [phone] Representative spoke with at [place]: [place] ([place]) Interventions    Readmission Risk Interventions Readmission Risk Prevention Plan 02/27/2019  Transportation Screening Complete  PCP or Specialist Appt within 5-7 Days Complete  Home Care Screening Complete  Medication Review (RN CM) Complete  Some recent data might be hidden

## 2019-02-27 NOTE — Progress Notes (Signed)
PT Cancellation Note  Patient Details Name: Sandy Roberson MRN: [id number] DOB: 1916/03/07   Cancelled Treatment:    Reason Eval/Treat Not Completed: Medical issues which prohibited therapy(Pt in bed, pt's son's at bedside. Pt reports pt is having some distress, was choking while eating, also emesis. Son reports she had had esophagus issues earier this year. Son reports they are awaiting SLP to see pt.) Subjective information and history collected from Son. Will attempt evaluation again at later date/time as appropriate.   1:42 PM, 02/27/19 Etta Grandchild, PT, DPT Physical Therapist - [facility]  [phone] ([place])     [place] C 02/27/2019, 1:41 PM

## 2019-02-27 NOTE — Evaluation (Signed)
Clinical/Bedside Swallow Evaluation Patient Details  Name: Sandy Roberson MRN: [id number] Date of Birth: Sep 01, 1915  Today's Date: 02/27/2019 Time: SLP Start Time (ACUTE ONLY): 61 SLP Stop Time (ACUTE ONLY): 1508 SLP Time Calculation (min) (ACUTE ONLY): 53 min  Past Medical History:  Past Medical History:  Diagnosis Date  . Coronary artery disease   . Hypertension   . Renal disorder    Past Surgical History:  Past Surgical History:  Procedure Laterality Date  . CORONARY STENT PLACEMENT    . ESOPHAGOGASTRODUODENOSCOPY (EGD) WITH PROPOFOL N/A 10/20/2018   Procedure: ESOPHAGOGASTRODUODENOSCOPY (EGD) WITH PROPOFOL;  Surgeon: Lucilla Lame, MD;  Location: [facility] ENDOSCOPY;  Service: Endoscopy;  Laterality: N/A;  . FRACTURE SURGERY    . LOWER EXTREMITY ANGIOGRAPHY Right 11/24/2018   Procedure: LOWER EXTREMITY ANGIOGRAPHY;  Surgeon: Algernon Huxley, MD;  Location: [facility];  Service: Cardiovascular;  Laterality: Right;  . LOWER EXTREMITY ANGIOGRAPHY Right 01/05/2019   Procedure: LOWER EXTREMITY ANGIOGRAPHY;  Surgeon: Algernon Huxley, MD;  Location: [facility];  Service: Cardiovascular;  Laterality: Right;  . STENT PLACEMENT RT [facility])     since been removed   . TRANSMETATARSAL AMPUTATION Right 02/26/2019   Procedure: 1st METATARSAL RESECTION;  Surgeon: Albertine Patricia, DPM;  Location: [facility] ORS;  Service: Podiatry;  Laterality: Right;   HPI:  Sandy Roberson is a 85 y.o. female w/ PMH of Esophageal phase Dysphagia/Dysmotility(see EGD 10/2018), HTN, CAD. She is 1 day postop following resection of first metatarsal head. 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 EGD 10/2018 revealed: "intrinsic moderate stenosis was found at the gastroesophageal junction". Food impaction was removed and dilation performed then by GI. (pt was not fully aware of this when discussed)   Assessment / Plan /  Recommendation Clinical Impression  Pt appeared to present w/ Adequate oropharyngeal phase swallowing function w/ No overt, clinical s/s of aspiration during/post oral intake. Pt has BASELINE ESOPHAGEAL PHASE DYSMOTILITY AND DYSPHAGIA --- see EGD in 10/2018 in which "intrinsic moderate stenosis was found at the gastroesophageal junction". Food impaction was removed and dilation performed then by GI. (pt was not fully aware of this when discussed)  ANY Esophageal phase dysmotility and Regurgitation can impact the pharyngeal phase of swallowing and increased risk for aspiration of REFLUX material.  During this eval, pt consumed trials of thin liquids, purees, and softened solids w/ No overt coughing or other clinical s/s of aspiration during/post intake. Oral phase was [facility] for bolus management and oral clearing b/t trials OM exam revealed No unilateral weakness. Pt fed self given setup support. Pt preferred/recommended room temp liquids - not too cold for ease on the Esophageal motility. This, and much other Esophageal phase motility information and recommendations, were discusseed w/ pt; Handouts given. Recommend a more Minced foods/meats diet w/ thin liquids; more soups in diet, even purees. This may aid Esophageal clearing. Recommend strict REFLUX precautions; general aspiration precautions. Pills in puree broken small and/or in crushed/liquid forms if any discomfort during the Esophageal phase. Recommend f/u w/ GI for ongoing management and education of the ESOPHAGEAL phase dysmotility as risk for repeated impaction may be present. Discussed session and recommendations w/ NSG who will f/u w/ Son when present again. Handouts given to pt to take home on recommendations.  SLP Visit Diagnosis: Dysphagia, pharyngoesophageal phase (R13.14)(Esophageal phase dysmotility)    Aspiration Risk  (reduced from an oropharyngeal phase standpoint)  Diet Recommendation  Dysphagia level 3 (mech soft foods w/ MINCED meats,  gravies added), Thin liquids. Soups added in meals, Yogurts; fruits. Strict REFLUX precautions; general aspiration precautions. Do not lie down post meals for ~1 hour for Reflux precautions d/t Esophageal phase dysmotility  Medication Administration: Whole meds with puree(if needed for ease of Esophageal clearing in future)    Other  Recommendations Recommended Consults: Consider GI evaluation;Consider esophageal assessment(ongoing; Dietician f/u) Oral Care Recommendations: Oral care BID;Patient independent with oral care Other Recommendations: (n/a)   Follow up Recommendations None      Frequency and Duration (n/a)  (n/a)       Prognosis Prognosis for Safe Diet [facility]: Fair Barriers to Reach Goals: Time post onset;Severity of deficits(Esophageal phase deficits)      Swallow Study   General Date of Onset: 02/25/19 HPI: Sandy Roberson is a 85 y.o. female w/ PMH of Esophageal phase Dysphagia/Dysmotility(see EGD 10/2018), HTN, CAD. She is 1 day postop following resection of first metatarsal head. 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 EGD 10/2018 revealed: "intrinsic moderate stenosis was found at the gastroesophageal junction". Food impaction was removed and dilation performed then by GI. (pt was not fully aware of this when discussed) Type of Study: Bedside Swallow Evaluation Previous Swallow Assessment: none. EGD in 10/2018 revealing Esophageal dysmotility/food impaction - dilation done Diet Prior to this Study: Regular;Thin liquids Temperature Spikes Noted: No(wbc 6.5) Respiratory Status: Room air History of Recent Intubation: No Behavior/Cognition: Alert;Cooperative;Pleasant mood(HOH) Oral Cavity Assessment: Within Functional Limits Oral Care Completed by SLP: Recent completion by staff Oral Cavity - Dentition: Missing dentition Vision: Functional for self-feeding Self-Feeding Abilities: Able to feed  self;Needs set up Patient Positioning: Upright in bed(pt already sitting up) Baseline Vocal Quality: Normal Volitional Cough: Strong Volitional Swallow: Able to elicit    Oral/Motor/Sensory Function Overall Oral Motor/Sensory Function: Within functional limits   Ice Chips Ice chips: Not tested   Thin Liquid Thin Liquid: Within functional limits Presentation: Cup;Self Fed(~3ozs total) Other Comments: pt preferred/recommended room temp liquids - not too cold for ease on the Esophageal motility    Nectar Thick Nectar Thick Liquid: Not tested   Honey Thick Honey Thick Liquid: Not tested   Puree Puree: Within functional limits Presentation: Self Fed;Spoon(4 trials)   Solid     Solid: Within functional limits Presentation: Self Fed;Spoon(2 trials)       Orinda Kenner, MS, CCC-SLP Rocklin Soderquist 02/27/2019,5:46 PM

## 2019-02-27 NOTE — Care Management Important Message (Signed)
Important Message  Patient Details  Name: Sandy Roberson MRN: [id number] Date of Birth: 07/21/1915   Medicare Important Message Given:  Yes     Dannette Barbara 02/27/2019, 2:12 PM

## 2019-02-27 NOTE — Evaluation (Signed)
Occupational Therapy Evaluation Patient Details Name: Sandy Roberson MRN: [id number] DOB: [birth year] Today's Date: 02/27/2019    History of Present Illness Sandy Roberson  is a 85 y.o. female, She is 1 day postop following resection of first metatarsal head.  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   Clinical Impression   Patient seen this date for OT evaluation.  Patient lives with her son and his girlfriend in a one story home.  Patient was independent with dressing, toileting, and medication management prior to admission but did require some assist for bathing.  Son performs all homemaking tasks, meal prep and takes her to all of her medical appointments.  Patient is hard of hearing, she has hearing aids but if they are not in or charging, she requires handwritten communication.  She currently requires min assist for transfers and functional mobility.  Patient presents with muscle weakness, decreased transfers, decreased functional mobility, decreased balance and decreased ability to perform self care tasks safely.  She would benefit from skilled OT services during her admission and would likely benefit from [facility] services to maximize her safety and independence in necessary daily tasks.      Follow Up Recommendations  Home health OT    Equipment Recommendations       Recommendations for Other Services       Precautions / Restrictions Precautions Precautions: Fall Restrictions Weight Bearing Restrictions: No RLE Weight Bearing: Non weight bearing Other Position/Activity Restrictions: MD note indicates patient can put full weight on LE, post op shoe      Mobility Bed Mobility Overal bed mobility: Needs Assistance Bed Mobility: Supine to Sit;Sit to Supine           General bed mobility comments: min assist with bed mobility, cues for scooting to edge of the bed  Transfers Overall transfer level:  Needs assistance Equipment used: Rolling walker (2 wheeled) Transfers: Sit to/from Stand Sit to Stand: Min assist              Balance Overall balance assessment: Needs assistance Sitting-balance support: Bilateral upper extremity supported Sitting balance-Leahy Scale: Fair     Standing balance support: Bilateral upper extremity supported Standing balance-Leahy Scale: Fair                             ADL either performed or assessed with clinical judgement   ADL Overall ADL's : Needs assistance/impaired Eating/Feeding: Modified independent   Grooming: Standing;Min guard Grooming Details (indicate cue type and reason): Patient seen for grooming in standing at sink with walker with min guard.  Min assist for transfer sit to stand. Upper Body Bathing: Set up   Lower Body Bathing: Set up;Minimal assistance   Upper Body Dressing : Modified independent   Lower Body Dressing: Minimal assistance Lower Body Dressing Details (indicate cue type and reason): min assist to stand to negotiate clothing/depends. Toilet Transfer: Minimal assistance   Toileting- Clothing Manipulation and Hygiene: Minimal assistance       Functional mobility during ADLs: Minimal assistance       Vision Baseline Vision/History: Wears glasses Wears Glasses: Reading only Patient Visual Report: No change from baseline       Perception     Praxis      Pertinent Vitals/Pain Pain Assessment: No/denies pain     Hand Dominance Right   Extremity/Trunk Assessment Upper Extremity Assessment Upper Extremity Assessment:  Generalized weakness   Lower Extremity Assessment Lower Extremity Assessment: Defer to PT evaluation       Communication Communication Communication: HOH   Cognition Arousal/Alertness: Awake/alert Behavior During Therapy: WFL for tasks assessed/performed Overall Cognitive Status: Within Functional Limits for tasks assessed                                  General Comments: Patient very pleasant, able to follow commands.  requires written instructions if hearing aids are unavailable or charging.  Once hearing aids were charged, she was able to communicate effectively.   General Comments   TX:  Patient seen for grooming at the sink with min assist for transfer and mobility with RW, min guard while standing at the sink and cues for tasks and safety.  Patient was able to don her left sock independently and assist with donning and doffing post op shoe.      Exercises     Shoulder Instructions      Home Living Family/patient expects to be discharged to:: Private residence Living Arrangements: Children Available Help at Discharge: Family Type of Home: House Home Access: Stairs to enter [employer] of Steps: 2-3 steps, she only goes out for appts, son helps her in and out of the home   Home Layout: One level     Bathroom Shower/Tub: Tub/shower unit;Door   Bathroom Toilet: Handicapped height     Home Equipment: Environmental consultant - 2 wheels;Cane - single point;Grab bars - tub/shower;Shower seat   Additional Comments: patient uses BSC over toilet but was able to ambulate to the bathroom independently with RW prior to admission      Prior Functioning/Environment Level of Independence: Needs assistance    ADL's / Homemaking Assistance Needed: Patient was independent with dressing, grooming, medication management, toileting.  Assist for bathing.   Comments: Prior to admission, patient was modified independent with basic self care tasks but did have an aide recently who assisted with bathing, 2 times a week.  Family assists with all home management tasks.  Patient is extremely HOH but has hearing aids.        OT Problem List: Decreased strength;Decreased activity tolerance;Impaired balance (sitting and/or standing);Decreased safety awareness      OT Treatment/Interventions: Self-care/ADL training;Therapeutic exercise;Balance  training;DME and/or AE instruction;Patient/family education    OT Goals(Current goals can be found in the care plan section) Acute Rehab OT Goals Patient Stated Goal: Patient would like to return home with her son, be able to dress herself, get up to the bathroom OT Goal Formulation: With patient/family Time For Goal Achievement: 03/07/19 Potential to Achieve Goals: Good ADL Goals Pt Will Perform Lower Body Dressing: with supervision Pt Will Transfer to Toilet: with supervision  OT Frequency: Min 1X/week   Barriers to D/C:            Co-evaluation              AM-PAC OT "6 Clicks" Daily Activity     Outcome Measure Help from another person eating meals?: None Help from another person taking care of personal grooming?: A Little Help from another person toileting, which includes using toliet, bedpan, or urinal?: A Little Help from another person bathing (including washing, rinsing, drying)?: A Lot Help from another person to put on and taking off regular upper body clothing?: None Help from another person to put on and taking off regular lower body clothing?: A Little 6  Click Score: 19   End of Session Equipment Utilized During Treatment: Gait belt;Rolling walker  Activity Tolerance: Patient tolerated treatment well Patient left: in bed;with call bell/phone within reach;with bed alarm set;with family/visitor present  OT Visit Diagnosis: Unsteadiness on feet (R26.81);Muscle weakness (generalized) (M62.81)                Time: 1120-1202 OT Time Calculation (min): 42 min Charges:  OT General Charges $OT Visit: 1 Visit OT Evaluation $OT Eval Low Complexity: 1 Low OT Treatments $Self Care/Home Management : 8-22 mins   T , OTR/L, CLT   , 02/27/2019, 12:56 PM

## 2019-02-27 NOTE — Progress Notes (Signed)
Spoke to patient's son Wille Glaser and gave him an update on patient.

## 2019-02-27 NOTE — Evaluation (Signed)
Physical Therapy Evaluation Patient Details Name: Sandy Roberson MRN: [id number] DOB: [birth year] Today's Date: 02/27/2019   History of Present Illness  Sandy Roberson  is a 85 y.o. female, She is 1 day postop following resection of first metatarsal head.  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  Clinical Impression  Pt admitted with above diagnosis. Pt currently with functional limitations due to the deficits listed below (see "PT Problem List"). Upon entry, pt in bed, awake and agreeable to participate. History and PLOF collected from son. The pt is alert and oriented x3, pleasant, conversational, and generally a good historian. Moves well to EOB (low effort, no assist needed), does requires minA to rise from EOB with RW, but once standing established balance quite well. Pt has good awareness of limitations and safety. Pt tolerating ~4ft of AMB with RW, but does endorse fatigue and need to return to room for recovery. Pt educated on need to use post-op shoe when OOB. Functional mobility assessment demonstrates increased effort/time requirements, fair tolerance, and need for physical assistance, whereas the patient performed these at a higher level of independence PTA.  Pt will benefit from skilled PT intervention to increase independence and safety with basic mobility and ADL performance in preparation for discharge to the venue listed below.       Follow Up Recommendations Home health PT    Equipment Recommendations  None recommended by PT    Recommendations for Other Services       Precautions / Restrictions Precautions Precautions: Fall Required Braces or Orthoses: Other Brace(postop shoe required) Other Brace: postopshoe Restrictions Weight Bearing Restrictions: No RLE Weight Bearing: Weight bearing as tolerated Other Position/Activity Restrictions: MD note indicates patient can put full weight on LE, post  op shoe      Mobility  Bed Mobility Overal bed mobility: Modified Independent Bed Mobility: Sit to Supine;Supine to Sit     Supine to sit: Modified independent (Device/Increase time) Sit to supine: Modified independent (Device/Increase time)   General bed mobility comments: min assist with bed mobility, cues for scooting to edge of the bed  Transfers Overall transfer level: Needs assistance Equipment used: Rolling walker (2 wheeled) Transfers: Sit to/from Stand;Lateral/Scoot Transfers Sit to Stand: Min assist        Lateral/Scoot Transfers: Min assist    Ambulation/Gait Ambulation/Gait assistance: Min guard Gait Distance (Feet): 50 Feet Assistive device: Rolling walker (2 wheeled) Gait Pattern/deviations: Step-to pattern     General Gait Details: very short steps; fatigues easily; distance and AMB qulaity consistent with baseline reported by son.  Stairs            Wheelchair Mobility    Modified Rankin (Stroke Patients Only)       Balance Overall balance assessment: Needs assistance Sitting-balance support: No upper extremity supported;Feet supported Sitting balance-Leahy Scale: Good     Standing balance support: Bilateral upper extremity supported Standing balance-Leahy Scale: Fair                               Pertinent Vitals/Pain Pain Assessment: No/denies pain    Home Living Family/patient expects to be discharged to:: Private residence Living Arrangements: Children Available Help at Discharge: Family Type of Home: House Home Access: Stairs to enter   [employer] of Steps: 2-3 steps, she only goes out for appts, son helps her in and out of the home  Home Layout: One level Home Equipment: Walker - 2 wheels;Cane - single point;Grab bars - tub/shower;Shower seat Additional Comments: patient uses BSC over toilet but was able to ambulate to the bathroom independently with RW prior to admission    Prior Function Level of  Independence: Needs assistance   Gait / Transfers Assistance Needed: RW at all time in house. Typically modI with transfers. Pt has a bed at has elevating HOB, but pt has been sleeping in recliner for years prior to getting bed.  ADL's / Homemaking Assistance Needed: Patient was independent with dressing, grooming, medication management, toileting.  Assist for bathing.  Comments: Prior to admission, patient was modified independent with basic self care tasks but did have an aide recently who assisted with bathing, 2 times a week.  Family assists with all home management tasks.  Patient is extremely HOH but has hearing aids.     Hand Dominance   Dominant Hand: Right    Extremity/Trunk Assessment   Upper Extremity Assessment Upper Extremity Assessment: Generalized weakness    Lower Extremity Assessment Lower Extremity Assessment: Defer to PT evaluation       Communication   Communication: Deaf  Cognition Arousal/Alertness: Awake/alert Behavior During Therapy: WFL for tasks assessed/performed Overall Cognitive Status: Within Functional Limits for tasks assessed                                 General Comments: Patient very pleasant, conversational.      General Comments      Exercises     Assessment/Plan    PT Assessment Patient needs continued PT services  PT Problem List Decreased strength;Decreased activity tolerance;Decreased knowledge of use of DME;Decreased knowledge of precautions;Decreased mobility       PT Treatment Interventions DME instruction;Balance training;Stair training;Gait training;Functional mobility training;Therapeutic activities;Therapeutic exercise;Patient/family education    PT Goals (Current goals can be found in the Care Plan section)  Acute Rehab PT Goals Patient Stated Goal: Patient would like to return home with her son, be able to dress herself, get up to the bathroom PT Goal Formulation: With patient Time For Goal  Achievement: 03/13/19 Potential to Achieve Goals: Good    Frequency 7X/week   Barriers to discharge        Co-evaluation               AM-PAC PT "6 Clicks" Mobility  Outcome Measure Help needed turning from your back to your side while in a flat bed without using bedrails?: None Help needed moving from lying on your back to sitting on the side of a flat bed without using bedrails?: None Help needed moving to and from a bed to a chair (including a wheelchair)?: A Little Help needed standing up from a chair using your arms (e.g., wheelchair or bedside chair)?: A Little Help needed to walk in hospital room?: A Little Help needed climbing 3-5 steps with a railing? : A Lot 6 Click Score: 19    End of Session Equipment Utilized During Treatment: Gait belt Activity Tolerance: Patient tolerated treatment well;No increased pain;Patient limited by fatigue Patient left: in bed;with nursing/sitter in room;with bed alarm set Nurse Communication: Mobility status PT Visit Diagnosis: Unsteadiness on feet (R26.81);Difficulty in walking, not elsewhere classified (R26.2);Other abnormalities of gait and mobility (R26.89)    Time: 1501-1520 PT Time Calculation (min) (ACUTE ONLY): 19 min   Charges:   PT Evaluation $PT Eval Low Complexity: 1 Low  3:53 PM, 02/27/19 Etta Grandchild, PT, DPT Physical Therapist - [facility]  [phone] ([place])    [place] C 02/27/2019, 3:51 PM

## 2019-02-27 NOTE — Progress Notes (Signed)
[facility] Podiatry                                                      Patient Demographics  Sandy Roberson, is a 85 y.o. female   MRN: [id number]   DOB - 05/17/1915  Admit Date - 02/25/2019    Outpatient Primary MD for the patient is Tracie Harrier, MD  Consult requested in the Hospital by Kayleen Memos, DO, On 02/27/2019    With History of -  Past Medical History:  Diagnosis Date  . Coronary artery disease   . Hypertension   . Renal disorder       Past Surgical History:  Procedure Laterality Date  . CORONARY STENT PLACEMENT    . ESOPHAGOGASTRODUODENOSCOPY (EGD) WITH PROPOFOL N/A 10/20/2018   Procedure: ESOPHAGOGASTRODUODENOSCOPY (EGD) WITH PROPOFOL;  Surgeon: Lucilla Lame, MD;  Location: [facility] ENDOSCOPY;  Service: Endoscopy;  Laterality: N/A;  . FRACTURE SURGERY    . LOWER EXTREMITY ANGIOGRAPHY Right 11/24/2018   Procedure: LOWER EXTREMITY ANGIOGRAPHY;  Surgeon: Algernon Huxley, MD;  Location: [place] CV LAB;  Service: Cardiovascular;  Laterality: Right;  . LOWER EXTREMITY ANGIOGRAPHY Right 01/05/2019   Procedure: LOWER EXTREMITY ANGIOGRAPHY;  Surgeon: Algernon Huxley, MD;  Location: [place] CV LAB;  Service: Cardiovascular;  Laterality: Right;  . STENT PLACEMENT RT [facility] HX)     since been removed     in for   No chief complaint on file.    HPI  Sandy Roberson  is a 85 y.o. female, She is 1 day postop following resection of first metatarsal head.  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  She is very hard of hearing and her hearing aids are nonfunctional now due to dead batteries.  In addition to the HPI above,  No  Fever-chills, No Headache, No changes with Vision or hearing, No problems swallowing food or Liquids, No Chest pain, Cough or Shortness of Breath, No Abdominal pain, No Nausea or Vommitting, Bowel movements are regular, No Blood in stool or Urine, No dysuria, No new skin rashes or bruises, No new joints pains-aches,  No new weakness, tingling, numbness in any extremity, No recent weight gain or loss, No polyuria, polydypsia or polyphagia, No significant Mental Stressors.  A full 10 point Review of Systems was done, except as stated above, all other Review of Systems were negative.   Social History Social History   Tobacco Use  . Smoking status: Never Smoker  . Smokeless tobacco: Never Used  Substance Use Topics  . Alcohol use: No    Family History Family History  Problem Relation Age of Onset  . Heart failure Neg Hx     Prior to Admission medications   Medication Sig Start Date End Date Taking? Authorizing Provider  atorvastatin (LIPITOR) 10 MG tablet Take 1 tablet (10 mg total) by mouth daily. 01/05/19 01/05/20 Yes Dew, Erskine Squibb, MD  carvedilol (COREG) 3.125 MG tablet Take 1 tablet (3.125 mg total) by mouth 2 (two) times daily with a meal. 09/25/14  Yes Gladstone Lighter, MD  Cholecalciferol (VITAMIN D3) 50 MCG (  2000 UT) TABS Take 2,000 Units by mouth daily.    Yes [provider]  clopidogrel (PLAVIX) 75 MG tablet Take 1 tablet (75 mg total) by mouth daily. 11/24/18  Yes Dew, Erskine Squibb, MD  methimazole (TAPAZOLE) 5 MG tablet Take 5 mg by mouth daily.    Yes [provider]  trimethoprim (TRIMPEX) 100 MG tablet Take 100 mg by mouth daily.   Yes [provider]    Anti-infectives (From admission, onward)   Start     Dose/Rate Route Frequency Ordered Stop   02/26/19 1000  levofloxacin (LEVAQUIN) IVPB 500 mg     500 mg 100 mL/hr over 60 Minutes Intravenous Every 24 hours 02/25/19 1655 02/26/19 1144   02/25/19 1515  trimethoprim (TRIMPEX) tablet 100  mg     100 mg Oral Daily 02/25/19 1514        Scheduled Meds: . aspirin EC  81 mg Oral Daily  . atorvastatin  10 mg Oral Daily  . carvedilol  3.125 mg Oral BID WC  . cholecalciferol  2,000 Units Oral QODAY  . clopidogrel  75 mg Oral Daily  . enoxaparin (LOVENOX) injection  30 mg Subcutaneous Q24H  . methimazole  5 mg Oral Daily  . trimethoprim  100 mg Oral Daily   Continuous Infusions: . sodium chloride Stopped (02/26/19 1219)  . lactated ringers 75 mL/hr at 02/27/19 0642   PRN Meds:.sodium chloride, acetaminophen **OR** acetaminophen, HYDROcodone-acetaminophen, labetalol, senna-docusate  Allergies  Allergen Reactions  . Penicillins Rash    Did it involve swelling of the face/tongue/throat, SOB, or low BP? No Did it involve sudden or severe rash/hives, skin peeling, or any reaction on the inside of your mouth or nose? No Did you need to seek medical attention at a hospital or doctor's office? No When did it last happen?10 Years If all above answers are "NO", may proceed with cephalosporin use.    Physical Exam  Vitals  Blood pressure (!) 160/80, pulse 79, temperature 98.6 F (37 C), temperature source Oral, resp. rate 20, height 5\' 2"  (1.575 m), weight 65.8 kg, SpO2 98 %.  Lower Extremity exam: Dressing is dry clean and intact there are no evidence of any drainage from the area.  Her wound was fairly clean yesterday with minimal evidence of affection.  White count is 6.5 Color to the end of her great toe is good. Data Review  CBC Recent Labs  Lab 02/25/19 1442 02/26/19 0418 02/27/19 0332  WBC 5.3 5.4 6.5  HGB 10.6* 9.0* 9.4*  HCT 32.9* 27.9* 28.3*  PLT 174 153 151  MCV 96.8 95.2 93.7  MCH 31.2 30.7 31.1  MCHC 32.2 32.3 33.2  RDW 15.0 15.1 15.2   ------------------------------------------------------------------------------------------------------------------  Chemistries  Recent Labs  Lab 02/25/19 1442 02/26/19 0418 02/27/19 0332  NA 140 140 138   K 4.9 4.2 4.5  CL 107 112* 109  CO2 25 23 21*  GLUCOSE 161* 77 77  BUN 44* 38* 34*  CREATININE 1.46* 1.26* 1.18*  CALCIUM 9.2 8.2* 8.4*  MG 2.1  --   --    ------------------------------------------------------------------------------------------------------------------ estimated creatinine clearance is 22 mL/min (A) (by C-G formula based on SCr of 1.18 mg/dL (H)). ------------------------------------------------------------------------------------------------------------------ No results for input(s): TSH, T4TOTAL, T3FREE, THYROIDAB in the last 72 hours.  Invalid input(s): FREET3 Urinalysis    Component Value Date/Time   COLORURINE YELLOW (A) 07/23/2015 1051   APPEARANCEUR CLEAR (A) 07/23/2015 1051   LABSPEC 1.013 07/23/2015 1051   PHURINE 7.0 07/23/2015 1051  GLUCOSEU NEGATIVE 07/23/2015 1051   HGBUR NEGATIVE 07/23/2015 1051   BILIRUBINUR NEGATIVE 07/23/2015 [place] 07/23/2015 1051   PROTEINUR NEGATIVE 07/23/2015 1051   NITRITE NEGATIVE 07/23/2015 1051   LEUKOCYTESUR NEGATIVE 07/23/2015 1051    Assessment & Plan: From my perspective I think the patient is stable.  I have ordered physical therapy to evaluate her for some bathroom ambulatory privileges utilizing a walker and she must have her postop shoe on also.  She has been on trimethoprim for least a couple weeks and I think she needs to stay on that it has done a good job of preventing progressing infection . The exposed portion of bone was removed yesterday.  I was able to close the wound primarily.  She does not need any dressing changes.  She lives with her son.  I would like to have her back in my office on Tuesday of next week.  Recommend she continue to take the trimethoprim at this point.  She has been taking that for making UTI but it seems to be keeping her foot stable also.  She is really not having any pain so she likely does not need pain medication.  If she meets criteria for internal medicine  then I think she is stable from my point to be discharged today.  Do recommend she stay off of it as much as possible but she does not ambulate too much at any rate.  Active Problems:   Foot infection   Family Communication: Plan discussed with patient .  Albertine Patricia M.D on 02/27/2019 at 8:36 AM  Thank you for the consult, we will follow the patient with you in the Hospital.

## 2019-02-27 NOTE — Progress Notes (Signed)
PROGRESS NOTE  Sandy Roberson [id number] DOB: 11-Mar-1916 DOA: 02/25/2019 PCP: Tracie Harrier, MD  HPI/Recap of past 24 hours:  Sandy Roberson is a 85 y.o. female with past medical history of hypertension, coronary artery disease status post stent, peripheral vascular disease was sent to the hospital directly from Dr. Elvina Mattes podiatry office for management of first metatarsal phalangeal joint wound infection with ulceration.  Dr. Elvina Mattes is recommending removal of the head of the first metatarsal so patient has been sent for admission to the hospital.  Of note patientm had vascular surgery evaluation done in the past twice with angioplasty, recent visit on 01/05/2019 by [facility] specialist.  Patient denies any nausea, vomiting, abdominal pain but sometimes has trouble swallowing food.  Patient denies any diarrhea, abdominal pain.  Denies urinary urgency, frequency or dysuria.  Denies any cough, shortness of breath, fever, chills or rigors.  Denies any recent travel or sick contacts.  Patient ambulates with the help of walker at home and lives with her son at home.  02/26/19: Seen and examined at her bedside. Reports some pain in her R 1st metatarsal overnight which improved after taking pain medication. No other complaints. Plan for procedure in the OR with podiatry today.  02/27/19: Patient was seen and examined at her bedside this morning.  She is alert and interactive.  Very hard of hearing.  Seen by Occupational Therapy.  Recommendation for further PT OT prior to discharge.  Patient will need to be accustomed to her newly R first metatarsal head resection.  We will continue PT OT in the inpatient setting, arrange for home health services and possibly discharge in the morning or when appropriate.  Assessment/Plan: Active Problems:   Foot infection  POD #1 post right first toe metatarsophalangeal joint ulceration with underlying osteomyelitis.   History of  peripheral vascular disease status post angioplasty and stent.  Seen by Dr. Elvina Mattes, podiatry and sent here for resection of the metatarsal head.   Podiatry recommended vancomycin IV Completed course of IV vancomycin on 02/26/2019. Recommendations per podiatry: Continue Augmentin, to stay off her right foot as much as possible, and to follow-up in podiatry's office on Tuesday of next week on 03/03/19.  Uncontrolled HTN Possibly contributed by pain She is currently on Coreg 3.125 mg twice daily Continue to monitor vital signs  Hyperthyroidism Continue methimazole when no longer NPO  PVD/CAD Continue Lipitor, aspirin, and plavix Follow-up with your cardiologist outpatient  CKD 3 Appears to be at her baseline creatinine Creatinine 1.18 with GFR 38 Continue to avoid nephrotoxins like NSAIDs Monitor urine output Continue daily BMPs  Chronic dCHF 2D echo done in 2016 normal LVEF w Grade 1DD Continue strict I&O and daily weights  Chronic normocytic anemia Baseline hemoglobin appears to be 10 with MCV of 96 Minimal blood loss and surgery Hemoglobin stable 9.4 on 02/27/2019 from 9.0  Consultant:  Podiatry, Dr.[name]  Code Status: Full code.  Confirmed with the patient's son at bedside  DVT Prophylaxis: lovenox SQ daily  Antibiotics: Vancomycin IV  Family Communication:  Updated her son at bedside on 02/27/2019  Disposition Plan:  Home possibly tomorrow 02/28/2019 when she is more stable with her balance.   Objective: Vitals:   02/27/19 0423 02/27/19 0646 02/27/19 0659 02/27/19 0908  BP: (!) 180/85 (!) 172/76 (!) 160/80 (!) 166/78  Pulse: 81 75 79 87  Resp: 20   20  Temp: 98.6 F (37 C)   98.4 F (36.9 C)  TempSrc: Oral  Oral  SpO2: 98%   96%  Weight: 65.8 kg     Height:        Intake/Output Summary (Last 24 hours) at 02/27/2019 1147 Last data filed at 02/27/2019 0908 Gross per 24 hour  Intake 985.9 ml  Output 2 ml  Net 983.9 ml   Filed Weights    02/26/19 0600 02/26/19 1239 02/27/19 0423  Weight: 55.1 kg 55 kg 65.8 kg    Exam:  . General: 85 y.o. year-old female Pleasant well developed well nourished. In no acute distress. Alert and interactive. . Cardiovascular: Regular rate and rhythm no rubs or gallops. Marland Kitchen Respiratory: Clear to Auscultation with No Wheezes or Rales.  Poor inspiratory effort. . Abdomen: Soft nontender nondistended normal bowel sounds present.   . Musculoskeletal: Trace lower extremity edema.  Right foot in surgical dressing. Marland Kitchen Psychiatry: Mood is appropriate for condition and setting.   Data Reviewed: CBC: Recent Labs  Lab 02/25/19 1442 02/26/19 0418 02/27/19 0332  WBC 5.3 5.4 6.5  HGB 10.6* 9.0* 9.4*  HCT 32.9* 27.9* 28.3*  MCV 96.8 95.2 93.7  PLT 174 153 [id number]   Basic Metabolic Panel: Recent Labs  Lab 02/25/19 1442 02/26/19 0418 02/27/19 0332  NA 140 140 138  K 4.9 4.2 4.5  CL 107 112* 109  CO2 25 23 21*  GLUCOSE 161* 77 77  BUN 44* 38* 34*  CREATININE 1.46* 1.26* 1.18*  CALCIUM 9.2 8.2* 8.4*  MG 2.1  --   --    GFR: Estimated Creatinine Clearance: 22 mL/min (A) (by C-G formula based on SCr of 1.18 mg/dL (H)). Liver Function Tests: No results for input(s): AST, ALT, ALKPHOS, BILITOT, PROT, ALBUMIN in the last 168 hours. No results for input(s): LIPASE, AMYLASE in the last 168 hours. No results for input(s): AMMONIA in the last 168 hours. Coagulation Profile: No results for input(s): INR, PROTIME in the last 168 hours. Cardiac Enzymes: No results for input(s): CKTOTAL, CKMB, CKMBINDEX, TROPONINI in the last 168 hours. BNP (last 3 results) No results for input(s): PROBNP in the last 8760 hours. HbA1C: Recent Labs    02/25/19 1442  HGBA1C 5.4   CBG: No results for input(s): GLUCAP in the last 168 hours. Lipid Profile: No results for input(s): CHOL, HDL, LDLCALC, TRIG, CHOLHDL, LDLDIRECT in the last 72 hours. Thyroid Function Tests: No results for input(s): TSH, T4TOTAL, FREET4,  T3FREE, THYROIDAB in the last 72 hours. Anemia Panel: No results for input(s): VITAMINB12, FOLATE, FERRITIN, TIBC, IRON, RETICCTPCT in the last 72 hours. Urine analysis:    Component Value Date/Time   COLORURINE YELLOW (A) 07/23/2015 1051   APPEARANCEUR CLEAR (A) 07/23/2015 1051   LABSPEC 1.013 07/23/2015 1051   PHURINE 7.0 07/23/2015 1051   GLUCOSEU NEGATIVE 07/23/2015 1051   HGBUR NEGATIVE 07/23/2015 1051   BILIRUBINUR NEGATIVE 07/23/2015 1051   KETONESUR NEGATIVE 07/23/2015 1051   PROTEINUR NEGATIVE 07/23/2015 1051   NITRITE NEGATIVE 07/23/2015 1051   LEUKOCYTESUR NEGATIVE 07/23/2015 1051   Sepsis Labs: @LABRCNTIP (procalcitonin:4,lacticidven:4)  ) Recent Results (from the past 240 hour(s))  SARS CORONAVIRUS 2 (TAT 6-24 HRS) Nasopharyngeal Nasopharyngeal Swab     Status: None   Collection Time: 02/25/19  4:04 PM   Specimen: Nasopharyngeal Swab  Result Value Ref Range Status   SARS Coronavirus 2 NEGATIVE NEGATIVE Final    Comment: (NOTE) SARS-CoV-2 target nucleic acids are NOT DETECTED. The SARS-CoV-2 RNA is generally detectable in upper and lower respiratory specimens during the acute phase of infection. Negative results do not  preclude SARS-CoV-2 infection, do not rule out co-infections with other pathogens, and should not be used as the sole basis for treatment or other patient management decisions. Negative results must be combined with clinical observations, patient history, and epidemiological information. The expected result is Negative. Fact Sheet for Patients: SugarRoll.be Fact Sheet for Healthcare Providers: https://www.woods-mathews.com/ This test is not yet approved or cleared by the [place] FDA and  has been authorized for detection and/or diagnosis of SARS-CoV-2 by FDA under an Emergency Use Authorization (EUA). This EUA will remain  in effect (meaning this test can be used) for the duration of the COVID-19  declaration under Section 56 4(b)(1) of the Act, 21 U.S.C. section 360bbb-3(b)(1), unless the authorization is terminated or revoked sooner. Performed at [facility], [place] [address], [place], [place] [address]   Surgical pcr screen     Status: None   Collection Time: 02/26/19  5:56 AM   Specimen: Nasal Mucosa; Nasal Swab  Result Value Ref Range Status   MRSA, PCR NEGATIVE NEGATIVE Final   Staphylococcus aureus NEGATIVE NEGATIVE Final    Comment: (NOTE) The Xpert SA Assay (FDA approved for NASAL specimens in patients 69 years of age and older), is one component of a comprehensive surveillance program. It is not intended to diagnose infection nor to guide or monitor treatment. Performed at [facility], [address] [place], [place] [address]   Aerobic/Anaerobic Culture (surgical/deep wound)     Status: None (Preliminary result)   Collection Time: 02/26/19  2:23 PM   Specimen: PATH Digit amputation; Tissue  Result Value Ref Range Status   Specimen Description   Final    FOOT Performed at [facility], [address], [place], [place] [address]    Special Requests   Final    BONE FIRST METARSAL HEAD RT FOOT Performed at [facility], [address], [place], [place] [address]    Gram Stain PENDING  Incomplete   Culture   Final    TOO YOUNG TO READ Performed at [facility], [place] [address], [place], [place] [address]    Report Status PENDING  Incomplete      Studies: No results found.  Scheduled Meds: . aspirin EC  81 mg Oral Daily  . atorvastatin  10 mg Oral Daily  . carvedilol  3.125 mg Oral BID WC  . cholecalciferol  2,000 Units Oral QODAY  . clopidogrel  75 mg Oral Daily  . enoxaparin (LOVENOX) injection  30 mg Subcutaneous Q24H  . methimazole  5 mg Oral Daily  . trimethoprim  100 mg Oral Daily    Continuous Infusions: . sodium chloride Stopped (02/26/19 1219)  . lactated ringers 75 mL/hr at 02/27/19 [id number]      LOS: 2 days     Kayleen Memos, MD [facility] Pager [phone]  If 7PM-7AM, please contact night-coverage [email or web address] Password [facility] 02/27/2019, 11:47 AM

## 2019-02-28 LAB — CBC
HCT: 28.2 % — ABNORMAL LOW (ref 36.0–46.0)
Hemoglobin: 9.2 g/dL — ABNORMAL LOW (ref 12.0–15.0)
MCH: 31.3 pg (ref 26.0–34.0)
MCHC: 32.6 g/dL (ref 30.0–36.0)
MCV: 95.9 fL (ref 80.0–100.0)
Platelets: 134 10*3/uL — ABNORMAL LOW (ref 150–400)
RBC: 2.94 MIL/uL — ABNORMAL LOW (ref 3.87–5.11)
RDW: 15.1 % (ref 11.5–15.5)
WBC: 9 10*3/uL (ref 4.0–10.5)
nRBC: 0 % (ref 0.0–0.2)

## 2019-02-28 MED ORDER — ASPIRIN 81 MG PO CHEW: 81.0000 mg | CHEWABLE_TABLET | Freq: Every day | ORAL | 0 refills | Status: AC

## 2019-02-28 MED ORDER — TRIMETHOPRIM 100 MG PO TABS: 100.0000 mg | ORAL_TABLET | Freq: Every day | ORAL | 0 refills | Status: DC

## 2019-02-28 NOTE — Discharge Summary (Signed)
Discharge Summary  Cynda Soule VVO:[id number] DOB: 10-05-1915  PCP: Tracie Harrier, MD  Admit date: 02/25/2019 Discharge date: 02/28/2019  Time spent: 35 minutes   Recommendations for Outpatient Follow-up:  1. Follow-up with podiatry within a week 2. Follow-up with your GI provider 3. Follow-up with your PCP 4. Take your medications as prescribed 5. Continue PT OT  Podiatry recommendations: S/p 1st met head resection right foot  Dressing intact.  No breakthrough bleeding.  Tolerating PT.  Using walker and post op shoe.  Home PT recommended.  Pt to f/u with Dr. Elvina Mattes early next week.  Keep dressing clean and dry.    OK from podiatry standpoint to d/c. C/w bactrim outpt. She lives with her son.  I would like to have her back in my office on Tuesday of next week.  Recommend she continue to take the trimethoprim at this point.  Do recommend she stay off of it as much as possible but she does not ambulate too much at any rate.    Discharge Diagnoses:  Active Hospital Problems   Diagnosis Date Noted  . Foot infection 02/25/2019    Resolved Hospital Problems  No resolved problems to display.    Discharge Condition: Stable  Diet recommendation: Diet as recommended by speech therapist: Diet Recommendation  Dysphagia level 3 (mech soft foods w/ MINCED meats, gravies added), Thin liquids. Soups added in meals, Yogurts; fruits. Strict REFLUX precautions; general aspiration precautions. Do not lie down post meals for ~1 hour for Reflux precautions d/t Esophageal phase dysmotility  Medication Administration: Whole meds with puree(if needed for ease of Esophageal clearing in future)      Vitals:   02/27/19 2320 02/28/19 0632  BP: (!) 160/70 (!) 153/64  Pulse: 89 78  Resp:    Temp:  98.7 F (37.1 C)  SpO2:  93%    History of present illness:  Cloria Dematteois a 85 y.o.femalewith past medical history of hypertension,coronary artery disease status  post stent,peripheral vascular disease was sent to the hospital directly from Dr. Elvina Mattes podiatry office for management of first metatarsal phalangeal joint wound infection with ulceration. Dr. Elvina Mattes is recommending removal of the head of the first metatarsal so patient has been sent for admission to the hospital. Of note patientmhad vascular surgery evaluation done in the past twicewith angioplasty,[facility].Patient denies any nausea, vomiting, abdominal pain but sometimes has trouble swallowing food. Patient denies any diarrhea, abdominal pain. Denies urinary urgency, frequency or dysuria. Denies any cough, shortness of breath, fever, chills or rigors. Denies any recent travel or sick contacts. Patient ambulates with the help of walker at home and lives with her son at home.  Post 1st metatarsal head resection right foot on 02/26/19 by Dr. Elvina Mattes.  02/28/19: Patient was seen and examined at her bedside this morning.  No acute events overnight.  She has no new complaints.  She is very hard of hearing.  Mainly communicated via handwriting.  Seen by podiatry, okay to DC from a podiatry standpoint.  Vital signs and labs reviewed and are stable.   On the day of discharge, the patient was hemodynamically stable.  She will need to follow-up with podiatry within a week, possibly on Tuesday 03/03/19, call to confirm appointment, GI, and her primary care provider posthospitalization.  She will also need to continue physical therapy as recommended by podiatry.  Hospital Course:  Active Problems:   Foot infection  POD #2 post right first toe metatarsophalangeal jointulceration with  underlyingosteomyelitis.  History of peripheral vascular disease status post angioplasty and stent. Seen by Dr. Alyson Reedy sent here for resection of the metatarsal head. Podiatry recommended vancomycin IV Completed course of IV vancomycin  on 02/26/2019. Recommendations per podiatry: Continue Augmentin, to stay off her right foot as much as possible, and to follow-up in podiatry's office on Tuesday of next week on 03/03/19.  Uncontrolled HTN Possibly contributed by pain She is currently on Coreg 3.125 mg twice daily Continue home meds and follow up with your PCP  Dysphagia level 3 Continue recommendations as provided by speech therapist Follow up with GI outpatient Continue aspiration precautions.  Hyperthyroidism Continue methimazole when no longer NPO  PVD/CAD Continue Lipitor, aspirin, and plavix Follow-up with your cardiologist outpatient  CKD 3 Appears to be at her baseline creatinine Creatinine 1.18 with GFR 38 Continue to avoid nephrotoxins like NSAIDs Monitor urine output Continue daily BMPs  Chronic dCHF 2D echo done in 2016 normal LVEF w Grade 1DD Continue strict I&O and daily weights  Chronic normocytic anemia Baseline hemoglobin appears to be 10 with MCV of 96 Minimal blood loss and surgery Hemoglobin stable 9.2 on 02/27/19 from  9.4 on 02/27/2019 from 9.0  Consultant: Podiatry,Dr.[name] Speech therapist   Code Status:Full code. Confirmed with the patient's son at bedside  DVT Prophylaxis:lovenox SQ daily  Antibiotics:VancomycinIV, trimpex  Family Communication: Updated her son via phone on 02/28/2019   Discharge Exam: BP (!) 153/64 (BP Location: Right Arm)   Pulse 78   Temp 98.7 F (37.1 C) (Oral)   Resp 20   Ht _0  (1.575 m)   Wt 67.1 kg   SpO2 93%   BMI 27.07 kg/m  . General: 85 y.o. year-old female well developed well nourished in no acute distress.  Alert and interactive. Very hard of hearing. . Cardiovascular: Regular rate and rhythm with no rubs or gallops.  No thyromegaly or JVD noted.   Marland Kitchen Respiratory: Clear to auscultation with no wheezes or rales. Good inspiratory effort. . Abdomen: Soft nontender nondistended with normal bowel sounds x4  quadrants. . Musculoskeletal: No lower extremity edema.  R foot is surgical dressing. Marland Kitchen Psychiatry: Mood is appropriate for condition and setting  Discharge Instructions You were cared for by a hospitalist during your hospital stay. If you have any questions about your discharge medications or the care you received while you were in the hospital after you are discharged, you can call the unit and asked to speak with the hospitalist on call if the hospitalist that took care of you is not available. Once you are discharged, your primary care physician will handle any further medical issues. Please note that NO REFILLS for any discharge medications will be authorized once you are discharged, as it is imperative that you return to your primary care physician (or establish a relationship with a primary care physician if you do not have one) for your aftercare needs so that they can reassess your need for medications and monitor your lab values.   Allergies as of 02/28/2019      Reactions   Penicillins Rash   Did it involve swelling of the face/tongue/throat, SOB, or low BP? No Did it involve sudden or severe rash/hives, skin peeling, or any reaction on the inside of your mouth or nose? No Did you need to seek medical attention at a hospital or doctor's office? No When did it last happen?10 Years If all above answers are "NO", may proceed with cephalosporin use.  Medication List    TAKE these medications   aspirin 81 MG chewable tablet Commonly known as: Aspirin 81 Chew 1 tablet (81 mg total) by mouth daily. What changed: medication strength   atorvastatin 10 MG tablet Commonly known as: Lipitor Take 1 tablet (10 mg total) by mouth daily.   carvedilol 3.125 MG tablet Commonly known as: Coreg Take 1 tablet (3.125 mg total) by mouth 2 (two) times daily with a meal.   clopidogrel 75 MG tablet Commonly known as: Plavix Take 1 tablet (75 mg total) by mouth daily.   methimazole 5  MG tablet Commonly known as: TAPAZOLE Take 5 mg by mouth daily.   trimethoprim 100 MG tablet Commonly known as: TRIMPEX Take 1 tablet (100 mg total) by mouth daily.   Vitamin D3 50 MCG (2000 UT) Tabs Take 2,000 Units by mouth daily.      Allergies  Allergen Reactions  . Penicillins Rash    Did it involve swelling of the face/tongue/throat, SOB, or low BP? No Did it involve sudden or severe rash/hives, skin peeling, or any reaction on the inside of your mouth or nose? No Did you need to seek medical attention at a hospital or doctor's office? No When did it last happen?10 Years If all above answers are "NO", may proceed with cephalosporin use.   Follow-up Information    Tracie Harrier, MD. Call in 1 day(s).   Specialty: Internal Medicine Why: please call for a post hospital follow up appointment Contact information: [place] [address] [place], [place], [place]. Call in 1 day(s).   Specialty: Podiatry Why: please call for a post hospital follow up appointment Contact information: [facility] [place] [address] [phone]            The results of significant diagnostics from this hospitalization (including imaging, microbiology, ancillary and laboratory) are listed below for reference.    Significant Diagnostic Studies: No results found.  Microbiology: Recent Results (from the past 240 hour(s))  SARS CORONAVIRUS 2 (TAT 6-24 HRS) Nasopharyngeal Nasopharyngeal Swab     Status: None   Collection Time: 02/25/19  4:04 PM   Specimen: Nasopharyngeal Swab  Result Value Ref Range Status   SARS Coronavirus 2 NEGATIVE NEGATIVE Final    Comment: (NOTE) SARS-CoV-2 target nucleic acids are NOT DETECTED. The SARS-CoV-2 RNA is generally detectable in upper and lower respiratory specimens during the acute phase of infection. Negative results do not preclude  SARS-CoV-2 infection, do not rule out co-infections with other pathogens, and should not be used as the sole basis for treatment or other patient management decisions. Negative results must be combined with clinical observations, patient history, and epidemiological information. The expected result is Negative. Fact Sheet for Patients: SugarRoll.be Fact Sheet for Healthcare Providers: https://www.woods-mathews.com/ This test is not yet approved or cleared by the [place] FDA and  has been authorized for detection and/or diagnosis of SARS-CoV-2 by FDA under an Emergency Use Authorization (EUA). This EUA will remain  in effect (meaning this test can be used) for the duration of the COVID-19 declaration under Section 56 4(b)(1) of the Act, 21 U.S.C. section 360bbb-3(b)(1), unless the authorization is terminated or revoked sooner. Performed at [facility], [place] [address] [place], [place] [address]   Surgical pcr screen     Status: None   Collection Time: 02/26/19  5:56 AM   Specimen: Nasal Mucosa;  Nasal Swab  Result Value Ref Range Status   MRSA, PCR NEGATIVE NEGATIVE Final   Staphylococcus aureus NEGATIVE NEGATIVE Final    Comment: (NOTE) The Xpert SA Assay (FDA approved for NASAL specimens in patients 63 years of age and older), is one component of a comprehensive surveillance program. It is not intended to diagnose infection nor to guide or monitor treatment. Performed at [facility], [address] [place], [place] [address]   Aerobic/Anaerobic Culture (surgical/deep wound)     Status: None (Preliminary result)   Collection Time: 02/26/19  2:23 PM   Specimen: PATH Digit amputation; Tissue  Result Value Ref Range Status   Specimen Description   Final    FOOT Performed at [facility], [address] [place], [place] [address]    Special Requests   Final    BONE FIRST METARSAL HEAD RT FOOT Performed  at [facility], [address] [place], [place] [address]    Gram Stain NO WBC SEEN RARE GRAM POSITIVE COCCI   Final   Culture   Final    TOO YOUNG TO READ Performed at [facility], [place] [address], [place], [place] [address]    Report Status PENDING  Incomplete     Labs: Basic Metabolic Panel: Recent Labs  Lab 02/25/19 1442 02/26/19 0418 02/27/19 0332  NA 140 140 138  K 4.9 4.2 4.5  CL 107 112* 109  CO2 25 23 21*  GLUCOSE 161* 77 77  BUN 44* 38* 34*  CREATININE 1.46* 1.26* 1.18*  CALCIUM 9.2 8.2* 8.4*  MG 2.1  --   --    Liver Function Tests: No results for input(s): AST, ALT, ALKPHOS, BILITOT, PROT, ALBUMIN in the last 168 hours. No results for input(s): LIPASE, AMYLASE in the last 168 hours. No results for input(s): AMMONIA in the last 168 hours. CBC: Recent Labs  Lab 02/25/19 1442 02/26/19 0418 02/27/19 0332 02/28/19 0525  WBC 5.3 5.4 6.5 9.0  HGB 10.6* 9.0* 9.4* 9.2*  HCT 32.9* 27.9* 28.3* 28.2*  MCV 96.8 95.2 93.7 95.9  PLT 174 153 151 134*   Cardiac Enzymes: No results for input(s): CKTOTAL, CKMB, CKMBINDEX, TROPONINI in the last 168 hours. BNP: BNP (last 3 results) No results for input(s): BNP in the last 8760 hours.  ProBNP (last 3 results) No results for input(s): PROBNP in the last 8760 hours.  CBG: No results for input(s): GLUCAP in the last 168 hours.     Signed:  Kayleen Memos, MD [facility] 02/28/2019, 12:02 PM

## 2019-02-28 NOTE — Progress Notes (Signed)
Daily Progress Note   Subjective  - 2 Days Post-Op  F/u right foot.  No complaints other than muscle spasm on occasion  Objective Vitals:   02/27/19 2124 02/27/19 2320 02/28/19 0500 02/28/19 0632  BP: (!) 132/101 (!) 160/70  (!) 153/64  Pulse: 98 89  78  Resp: 20     Temp: 98.3 F (36.8 C)   98.7 F (37.1 C)  TempSrc: Oral   Oral  SpO2: (!) 88%   93%  Weight:   67.1 kg   Height:        Physical Exam: Dressing intact.  No breakthrough bleeding.  Laboratory CBC    Component Value Date/Time   WBC 9.0 02/28/2019 0525   HGB 9.2 (L) 02/28/2019 0525   HCT 28.2 (L) 02/28/2019 0525   PLT 134 (L) 02/28/2019 0525    BMET    Component Value Date/Time   NA 138 02/27/2019 0332   K 4.5 02/27/2019 0332   CL 109 02/27/2019 0332   CO2 21 (L) 02/27/2019 0332   GLUCOSE 77 02/27/2019 0332   BUN 34 (H) 02/27/2019 0332   CREATININE 1.18 (H) 02/27/2019 0332   CALCIUM 8.4 (L) 02/27/2019 0332   GFRNONAA 38 (L) 02/27/2019 0332   GFRAA 44 (L) 02/27/2019 0332    Assessment/Planning: S/p 1st met head resection right foot   Dressing intact.  No breakthrough bleeding.  Tolerating PT.  Using walker and post op shoe.  Home PT recommended.  Pt to f/u with Dr. Elvina Mattes early next week.  Keep dressing clean and dry.    OK from podiatry standpoint to d/c. C/w bactrim outpt.  Samara Deist A  02/28/2019, 11:22 AM

## 2019-02-28 NOTE — Plan of Care (Signed)
Patient has a poor appetite. Encouraging patient to eat by giving ensure. Will continue to monitor.  Christene Slates

## 2019-02-28 NOTE — Progress Notes (Signed)
Sandy Roberson  A and O x 4 VSS. Pt tolerating diet well. No complaints of pain or nausea. IV removed intact, prescriptions given. Pt voices understanding of discharge instructions with no further questions. Pt discharged via wheelchair with axillary.   Allergies as of 02/28/2019      Reactions   Penicillins Rash   Did it involve swelling of the face/tongue/throat, SOB, or low BP? No Did it involve sudden or severe rash/hives, skin peeling, or any reaction on the inside of your mouth or nose? No Did you need to seek medical attention at a hospital or doctor's office? No When did it last happen?10 Years If all above answers are "NO", may proceed with cephalosporin use.      Medication List    TAKE these medications   aspirin 81 MG chewable tablet Commonly known as: Aspirin 81 Chew 1 tablet (81 mg total) by mouth daily. What changed: medication strength   atorvastatin 10 MG tablet Commonly known as: Lipitor Take 1 tablet (10 mg total) by mouth daily.   carvedilol 3.125 MG tablet Commonly known as: Coreg Take 1 tablet (3.125 mg total) by mouth 2 (two) times daily with a meal.   clopidogrel 75 MG tablet Commonly known as: Plavix Take 1 tablet (75 mg total) by mouth daily.   methimazole 5 MG tablet Commonly known as: TAPAZOLE Take 5 mg by mouth daily.   trimethoprim 100 MG tablet Commonly known as: TRIMPEX Take 1 tablet (100 mg total) by mouth daily.   Vitamin D3 50 MCG (2000 UT) Tabs Take 2,000 Units by mouth daily.       Vitals:   02/27/19 2320 02/28/19 0632  BP: (!) 160/70 (!) 153/64  Pulse: 89 78  Resp:    Temp:  98.7 F (37.1 C)  SpO2:  93%    Sandy Roberson Sandy Roberson

## 2019-03-02 LAB — SURGICAL PATHOLOGY

## 2019-03-03 LAB — AEROBIC/ANAEROBIC CULTURE W GRAM STAIN (SURGICAL/DEEP WOUND): Gram Stain: NONE SEEN

## 2019-03-09 ENCOUNTER — Other Ambulatory Visit (INDEPENDENT_AMBULATORY_CARE_PROVIDER_SITE_OTHER): Payer: Self-pay | Admitting: Nurse Practitioner

## 2019-03-09 DIAGNOSIS — L97514 Non-pressure chronic ulcer of other part of right foot with necrosis of bone: Secondary | ICD-10-CM

## 2019-03-10 ENCOUNTER — Ambulatory Visit (INDEPENDENT_AMBULATORY_CARE_PROVIDER_SITE_OTHER): Payer: Medicare Other | Admitting: Nurse Practitioner

## 2019-03-10 ENCOUNTER — Encounter (INDEPENDENT_AMBULATORY_CARE_PROVIDER_SITE_OTHER): Payer: Self-pay | Admitting: Nurse Practitioner

## 2019-03-10 ENCOUNTER — Encounter (INDEPENDENT_AMBULATORY_CARE_PROVIDER_SITE_OTHER): Payer: Self-pay

## 2019-03-10 ENCOUNTER — Ambulatory Visit (INDEPENDENT_AMBULATORY_CARE_PROVIDER_SITE_OTHER): Payer: Medicare Other

## 2019-03-10 ENCOUNTER — Other Ambulatory Visit: Payer: Self-pay

## 2019-03-10 VITALS — BP 151/69 | HR 72 | Resp 16

## 2019-03-10 DIAGNOSIS — I10 Essential (primary) hypertension: Secondary | ICD-10-CM

## 2019-03-10 DIAGNOSIS — L97514 Non-pressure chronic ulcer of other part of right foot with necrosis of bone: Secondary | ICD-10-CM | POA: Diagnosis not present

## 2019-03-10 DIAGNOSIS — I7025 Atherosclerosis of native arteries of other extremities with ulceration: Secondary | ICD-10-CM | POA: Diagnosis not present

## 2019-03-10 DIAGNOSIS — E785 Hyperlipidemia, unspecified: Secondary | ICD-10-CM

## 2019-03-11 ENCOUNTER — Telehealth (INDEPENDENT_AMBULATORY_CARE_PROVIDER_SITE_OTHER): Payer: Self-pay

## 2019-03-11 NOTE — Telephone Encounter (Signed)
I attempted to contact the patient's son [name] and message was left for a return call.

## 2019-03-16 ENCOUNTER — Encounter: Payer: Self-pay | Admitting: Podiatry

## 2019-03-16 NOTE — Progress Notes (Signed)
SUBJECTIVE:  Patient ID: Sandy Roberson, female    DOB: July 19, 1915, 85 y.o.   MRN: [id number] Chief Complaint  Patient presents with  . Follow-up    add on abi    HPI  Sandy Roberson is a 85 y.o. female The patient returns to the office for followup and review of the noninvasive studies. There has been a significant deterioration in the lower extremity symptoms.  The patient recently had a transmetatarsal amputation however it is not healing and starting to deteriorate.   There have been no significant changes to the patient's overall health care.  The patient denies amaurosis fugax or recent TIA symptoms. There are no recent neurological changes noted. The patient denies history of DVT, PE or superficial thrombophlebitis. The patient denies recent episodes of angina or shortness of breath.   ABI's Rt=0.67 and Lt=0.77 (previous ABI's Rt=0.93 and Lt=0.85) Duplex US of the lower extremity arterial system shows dampened monophasic waveforms in the right lower extremity with flat toe waveforms on the right.    Past Medical History:  Diagnosis Date  . Coronary artery disease   . Hypertension   . Renal disorder     Past Surgical History:  Procedure Laterality Date  . CORONARY STENT PLACEMENT    . ESOPHAGOGASTRODUODENOSCOPY (EGD) WITH PROPOFOL N/A 10/20/2018   Procedure: ESOPHAGOGASTRODUODENOSCOPY (EGD) WITH PROPOFOL;  Surgeon: Lucilla Lame, MD;  Location: [facility];  Service: Endoscopy;  Laterality: N/A;  . FRACTURE SURGERY    . LOWER EXTREMITY ANGIOGRAPHY Right 11/24/2018   Procedure: LOWER EXTREMITY ANGIOGRAPHY;  Surgeon: Algernon Huxley, MD;  Location: [facility];  Service: Cardiovascular;  Laterality: Right;  . LOWER EXTREMITY ANGIOGRAPHY Right 01/05/2019   Procedure: LOWER EXTREMITY ANGIOGRAPHY;  Surgeon: Algernon Huxley, MD;  Location: [facility];  Service: Cardiovascular;  Laterality: Right;  . STENT PLACEMENT RT [facility])     since been  removed   . 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 Marital status: Widowed    Spouse name: Not on file  . Number of children: Not on file  . Years of education: Not on file  . Highest education level: Not on file  Occupational History  . Not on file  Social Needs  . Financial resource strain: Not on file  . Food insecurity    Worry: Not on file    Inability: Not on file  . Transportation needs    Medical: Not on file    Non-medical: Not on file  Tobacco Use  . Smoking status: Never Smoker  . Smokeless tobacco: Never Used  Substance and Sexual Activity  . Alcohol use: No  . Drug use: Never  . Sexual activity: Not Currently  Lifestyle  . Physical activity    Days per week: Not on file    Minutes per session: Not on file  . Stress: Not on file  Relationships  . Social Herbalist on phone: Not on file    Gets together: Not on file    Attends religious service: Not on file    Active member of club or organization: Not on file    Attends meetings of clubs or organizations: Not on file    Relationship status: Not on file  . Intimate partner violence    Fear of current  or ex partner: Not on file    Emotionally abused: Not on file    Physically abused: Not on file    Forced sexual activity: Not on file  Other Topics Concern  . Not on file  Social History Narrative  . Not on file    Family History  Problem Relation Age of Onset  . Heart failure Neg Hx     Allergies  Allergen Reactions  . Penicillins Rash    Did it involve swelling of the face/tongue/throat, SOB, or low BP? No Did it involve sudden or severe rash/hives, skin peeling, or any reaction on the inside of your mouth or nose? No Did you need to seek medical attention at a hospital or doctor's office? No When did  it last happen?10 Years If all above answers are "NO", may proceed with cephalosporin use.     Review of Systems   Review of Systems: Negative Unless Checked Constitutional: [] Weight loss  [] Fever  [] Chills Cardiac: [] Chest pain   []  Atrial Fibrillation  [] Palpitations   [] Shortness of breath when laying flat   [] Shortness of breath with exertion. [] Shortness of breath at rest Vascular:  [] Pain in legs with walking   [] Pain in legs with standing [] Pain in legs when laying flat   [] Claudication    [] Pain in feet when laying flat    [] History of DVT   [] Phlebitis   [] Swelling in legs   [] Varicose veins   [] Non-healing ulcers Pulmonary:   [] Uses home oxygen   [] Productive cough   [] Hemoptysis   [] Wheeze  [] COPD   [] Asthma Neurologic:  [] Dizziness   [] Seizures  [] Blackouts [] History of stroke   [] History of TIA  [] Aphasia   [] Temporary Blindness   [] Weakness or numbness in arm   [x] Weakness or numbness in leg Musculoskeletal:   [] Joint swelling   [] Joint pain   [] Low back pain  []  History of Knee Replacement [] Arthritis [] back Surgeries  []  Spinal Stenosis    Hematologic:  [] Easy bruising  [] Easy bleeding   [] Hypercoagulable state   [x] Anemic Gastrointestinal:  [] Diarrhea   [] Vomiting  [] Gastroesophageal reflux/heartburn   [] Difficulty swallowing. [] Abdominal pain Genitourinary:  [x] Chronic kidney disease   [] Difficult urination  [] Anuric   [] Blood in urine [] Frequent urination  [] Burning with urination   [] Hematuria Skin:  [] Rashes   [] Ulcers [x] Wounds Psychological:  [] History of anxiety   []  History of major depression  []  Memory Difficulties      OBJECTIVE:   Physical Exam  BP (!) 151/69 (BP Location: Right Arm)   Pulse 72   Resp 16   Gen: WD/WN, NAD Head: [place]/AT, No temporalis wasting.  Ear/Nose/Throat: Hearing grossly intact, nares w/o erythema or drainage Eyes: PER, EOMI, sclera nonicteric.  Neck: Supple, no masses.  No JVD.  Pulmonary:  Good air movement, no use of  accessory muscles.  Cardiac: RRR Vascular: surgical wound right foot  Vessel Right Left  Dorsalis Pedis Npt Palpable Not Palpable  Posterior Tibial Not Palpable Not Palpable   Gastrointestinal: soft, non-distended. No guarding/no peritoneal signs.  Musculoskeletal: M/S 5/5 throughout.  No deformity or atrophy.  Neurologic: Pain and light touch intact in extremities.  Symmetrical.  Speech is fluent. Motor exam as listed above. Psychiatric: Judgment intact, Mood & affect appropriate for pt's clinical situation. Dermatologic: No Venous rashes. No Ulcers Noted.  No changes consistent with cellulitis. Lymph : No Cervical lymphadenopathy, no lichenification or skin changes of chronic lymphedema.       ASSESSMENT AND PLAN:  1.  Atherosclerosis of native arteries of the extremities with ulceration ([place])  Recommend:  The patient has evidence of severe atherosclerotic changes of both lower extremities associated with ulceration and tissue loss of the foot.  This represents a limb threatening ischemia and places the patient at the risk for limb loss.  Patient should undergo angiography of the lower extremities with the hope for intervention for limb salvage.  The risks and benefits as well as the alternative therapies was discussed in detail with the patient.  All questions were answered.  Patient agrees to proceed with angiography.  The patient will follow up with me in the office after the procedure.    2. Hyperlipidemia, unspecified hyperlipidemia type Continue statin as ordered and reviewed, no changes at this time   3. Essential hypertension Continue antihypertensive medications as already ordered, these medications have been reviewed and there are no changes at this time.    Current Outpatient Medications on File Prior to Visit  Medication Sig Dispense Refill  . aspirin (ASPIRIN 81) 81 MG chewable tablet Chew 1 tablet (81 mg total) by mouth daily. 30 tablet 0  . atorvastatin  (LIPITOR) 10 MG tablet Take 1 tablet (10 mg total) by mouth daily. 30 tablet 11  . calcitRIOL (ROCALTROL) 0.25 MCG capsule Take by mouth.    . carvedilol (COREG) 3.125 MG tablet Take 1 tablet (3.125 mg total) by mouth 2 (two) times daily with a meal. 60 tablet 1  . Cholecalciferol (VITAMIN D3) 50 MCG (2000 UT) TABS Take 2,000 Units by mouth daily.     . clopidogrel (PLAVIX) 75 MG tablet Take 1 tablet (75 mg total) by mouth daily. 30 tablet 11  . collagenase (SANTYL) ointment Apply topically.    Marland Kitchen doxycycline (VIBRA-TABS) 100 MG tablet Take 100 mg by mouth 2 (two) times daily.    . methimazole (TAPAZOLE) 5 MG tablet Take 5 mg by mouth daily.     Marland Kitchen trimethoprim (TRIMPEX) 100 MG tablet Take 1 tablet (100 mg total) by mouth daily. 14 tablet 0   No current facility-administered medications on file prior to visit.     There are no Patient Instructions on file for this visit. No follow-ups on file.   Kris Hartmann, NP  This note was completed with Sales executive.  Any errors are purely unintentional.

## 2019-03-17 ENCOUNTER — Other Ambulatory Visit
Admission: RE | Admit: 2019-03-17 | Discharge: 2019-03-17 | Disposition: A | Discharge status: Home / Self Care | Payer: Medicare Other | Source: Ambulatory Visit | Attending: Vascular Surgery | Admitting: Vascular Surgery

## 2019-03-17 DIAGNOSIS — Z20828 Contact with and (suspected) exposure to other viral communicable diseases: Secondary | ICD-10-CM | POA: Insufficient documentation

## 2019-03-17 DIAGNOSIS — Z01812 Encounter for preprocedural laboratory examination: Secondary | ICD-10-CM | POA: Diagnosis present

## 2019-03-18 LAB — SARS CORONAVIRUS 2 (TAT 6-24 HRS): SARS Coronavirus 2: NEGATIVE

## 2019-03-19 ENCOUNTER — Telehealth (INDEPENDENT_AMBULATORY_CARE_PROVIDER_SITE_OTHER): Payer: Self-pay

## 2019-03-19 ENCOUNTER — Other Ambulatory Visit (INDEPENDENT_AMBULATORY_CARE_PROVIDER_SITE_OTHER): Payer: Self-pay | Admitting: Nurse Practitioner

## 2019-03-19 NOTE — Telephone Encounter (Signed)
Patient's son called stating that after her right leg angio on 03/20/2019 with Dr. Lucky Cowboy he wanted wound care so she doesn't go through the weekend without it. I advised talking with Dr. Lucky Cowboy on Friday regarding the best course of action for her wound care.

## 2019-03-20 ENCOUNTER — Encounter
Admission: RE | Disposition: A | Discharge status: Home / Self Care | Payer: Self-pay | Source: Home / Self Care | Attending: Vascular Surgery

## 2019-03-20 ENCOUNTER — Encounter: Payer: Self-pay | Admitting: *Deleted

## 2019-03-20 ENCOUNTER — Other Ambulatory Visit: Payer: Self-pay

## 2019-03-20 ENCOUNTER — Ambulatory Visit
Admission: RE | Admit: 2019-03-20 | Discharge: 2019-03-20 | Disposition: A | Discharge status: Home / Self Care | Payer: Medicare Other | Attending: Vascular Surgery | Admitting: Vascular Surgery

## 2019-03-20 DIAGNOSIS — E785 Hyperlipidemia, unspecified: Secondary | ICD-10-CM | POA: Insufficient documentation

## 2019-03-20 DIAGNOSIS — Z79899 Other long term (current) drug therapy: Secondary | ICD-10-CM | POA: Diagnosis not present

## 2019-03-20 DIAGNOSIS — L97519 Non-pressure chronic ulcer of other part of right foot with unspecified severity: Secondary | ICD-10-CM | POA: Insufficient documentation

## 2019-03-20 DIAGNOSIS — I2510 Atherosclerotic heart disease of native coronary artery without angina pectoris: Secondary | ICD-10-CM | POA: Diagnosis not present

## 2019-03-20 DIAGNOSIS — I10 Essential (primary) hypertension: Secondary | ICD-10-CM | POA: Insufficient documentation

## 2019-03-20 DIAGNOSIS — I70261 Atherosclerosis of native arteries of extremities with gangrene, right leg: Secondary | ICD-10-CM | POA: Insufficient documentation

## 2019-03-20 DIAGNOSIS — L97909 Non-pressure chronic ulcer of unspecified part of unspecified lower leg with unspecified severity: Secondary | ICD-10-CM

## 2019-03-20 DIAGNOSIS — Z7982 Long term (current) use of aspirin: Secondary | ICD-10-CM | POA: Insufficient documentation

## 2019-03-20 HISTORY — DX: Hyperlipidemia, unspecified: E78.5

## 2019-03-20 HISTORY — PX: LOWER EXTREMITY ANGIOGRAPHY: CATH118251

## 2019-03-20 LAB — BUN: BUN: 54 mg/dL — ABNORMAL HIGH (ref 8–23)

## 2019-03-20 LAB — CREATININE, SERUM
Creatinine, Ser: 1.68 mg/dL — ABNORMAL HIGH (ref 0.44–1.00)
GFR calc Af Amer: 28 mL/min — ABNORMAL LOW (ref >60)
GFR calc non Af Amer: 24 mL/min — ABNORMAL LOW (ref >60)

## 2019-03-20 SURGERY — LOWER EXTREMITY ANGIOGRAPHY
Anesthesia: Moderate Sedation | Site: Leg Lower | Laterality: Left

## 2019-03-20 MED ORDER — IODIXANOL 320 MG/ML IV SOLN
INTRAVENOUS | Status: DC | PRN
  Administered 2019-03-20: 95 mL via INTRA_ARTERIAL

## 2019-03-20 MED ORDER — SODIUM CHLORIDE 0.9 % IV SOLN: 250.0000 mL | INTRAVENOUS | Status: DC | PRN

## 2019-03-20 MED ORDER — LABETALOL HCL 5 MG/ML IV SOLN: 10.0000 mg | INTRAVENOUS | Status: DC | PRN

## 2019-03-20 MED ORDER — FAMOTIDINE 20 MG PO TABS: 40.0000 mg | ORAL_TABLET | Freq: Once | ORAL | Status: DC | PRN

## 2019-03-20 MED ORDER — HEPARIN SODIUM (PORCINE) 1000 UNIT/ML IJ SOLN
INTRAMUSCULAR | Status: DC | PRN
  Administered 2019-03-20: 4000 [IU] via INTRAVENOUS

## 2019-03-20 MED ORDER — MIDAZOLAM HCL 2 MG/ML PO SYRP
ORAL_SOLUTION | ORAL | Status: AC
  Filled 2019-03-20: qty 4

## 2019-03-20 MED ORDER — CLINDAMYCIN PHOSPHATE 300 MG/50ML IV SOLN
300.0000 mg | Freq: Once | INTRAVENOUS | Status: AC
  Administered 2019-03-20: 16:00:00 300 mg via INTRAVENOUS

## 2019-03-20 MED ORDER — SODIUM CHLORIDE 0.9% FLUSH: 3.0000 mL | Freq: Two times a day (BID) | INTRAVENOUS | Status: DC

## 2019-03-20 MED ORDER — HEPARIN SODIUM (PORCINE) 1000 UNIT/ML IJ SOLN
INTRAMUSCULAR | Status: AC
  Filled 2019-03-20: qty 1

## 2019-03-20 MED ORDER — HYDROMORPHONE HCL 1 MG/ML IJ SOLN: 1.0000 mg | Freq: Once | INTRAMUSCULAR | Status: DC | PRN

## 2019-03-20 MED ORDER — ONDANSETRON HCL 4 MG/2ML IJ SOLN: 4.0000 mg | Freq: Four times a day (QID) | INTRAMUSCULAR | Status: DC | PRN

## 2019-03-20 MED ORDER — SODIUM CHLORIDE 0.9% FLUSH: 3.0000 mL | INTRAVENOUS | Status: DC | PRN

## 2019-03-20 MED ORDER — MIDAZOLAM HCL 5 MG/5ML IJ SOLN
INTRAMUSCULAR | Status: AC
  Filled 2019-03-20: qty 5

## 2019-03-20 MED ORDER — MIDAZOLAM HCL 2 MG/2ML IJ SOLN
INTRAMUSCULAR | Status: DC | PRN
  Administered 2019-03-20 (×3): 1 mg via INTRAVENOUS

## 2019-03-20 MED ORDER — MIDAZOLAM HCL 2 MG/ML PO SYRP
8.0000 mg | ORAL_SOLUTION | Freq: Once | ORAL | Status: AC | PRN
  Administered 2019-03-20: 4 mg via ORAL

## 2019-03-20 MED ORDER — CLINDAMYCIN PHOSPHATE 300 MG/50ML IV SOLN
INTRAVENOUS | Status: AC
  Filled 2019-03-20: qty 50

## 2019-03-20 MED ORDER — DIPHENHYDRAMINE HCL 50 MG/ML IJ SOLN: 50.0000 mg | Freq: Once | INTRAMUSCULAR | Status: DC | PRN

## 2019-03-20 MED ORDER — HYDRALAZINE HCL 20 MG/ML IJ SOLN: 5.0000 mg | INTRAMUSCULAR | Status: DC | PRN

## 2019-03-20 MED ORDER — SODIUM CHLORIDE 0.9 % IV SOLN
INTRAVENOUS | Status: DC
  Administered 2019-03-20: 14:00:00 via INTRAVENOUS

## 2019-03-20 MED ORDER — NITROGLYCERIN 1 MG/10 ML FOR IR/CATH LAB
INTRA_ARTERIAL | Status: AC
  Filled 2019-03-20: qty 10

## 2019-03-20 MED ORDER — METHYLPREDNISOLONE SODIUM SUCC 125 MG IJ SOLR: 125.0000 mg | Freq: Once | INTRAMUSCULAR | Status: DC | PRN

## 2019-03-20 MED ORDER — SODIUM CHLORIDE 0.9 % IV SOLN: INTRAVENOUS | Status: DC

## 2019-03-20 MED ORDER — ACETAMINOPHEN 325 MG PO TABS: 650.0000 mg | ORAL_TABLET | ORAL | Status: DC | PRN

## 2019-03-20 MED ORDER — FENTANYL CITRATE (PF) 100 MCG/2ML IJ SOLN
INTRAMUSCULAR | Status: DC | PRN
  Administered 2019-03-20 (×3): 25 ug via INTRAVENOUS

## 2019-03-20 MED ORDER — FENTANYL CITRATE (PF) 100 MCG/2ML IJ SOLN
INTRAMUSCULAR | Status: AC
  Filled 2019-03-20: qty 2

## 2019-03-20 SURGICAL SUPPLY — 18 items
BALLN ULTRVRSE 2.5X220X150 (BALLOONS) ×3
BALLN ULTRVRSE 2X100X150 (BALLOONS) ×3
BALLOON ULTRVRSE 2.5X220X150 (BALLOONS) ×1 IMPLANT
BALLOON ULTRVRSE 2X100X150 (BALLOONS) ×1 IMPLANT
CATH CXI SUPP ANG 4FR 135 (CATHETERS) ×1 IMPLANT
CATH CXI SUPP ANG 4FR 135CM (CATHETERS) ×3
CATH PIG 70CM (CATHETERS) ×3 IMPLANT
DEVICE PRESTO INFLATION (MISCELLANEOUS) ×3 IMPLANT
DEVICE STARCLOSE SE CLOSURE (Vascular Products) ×3 IMPLANT
GUIDEWIRE PFTE-COATED .018X300 (WIRE) ×3 IMPLANT
PACK ANGIOGRAPHY (CUSTOM PROCEDURE TRAY) ×3 IMPLANT
SHEATH ANL 5FRX90 (SHEATH) ×3 IMPLANT
SHEATH BRITE TIP 5FRX11 (SHEATH) ×3 IMPLANT
SYR MEDRAD MARK 7 150ML (SYRINGE) ×3 IMPLANT
TUBING CONTRAST HIGH PRESS 72 (TUBING) ×3 IMPLANT
WIRE G V18X300CM (WIRE) ×3 IMPLANT
WIRE J 3MM .035X145CM (WIRE) ×3 IMPLANT
WIRE MAGIC TORQUE 260C (WIRE) ×3 IMPLANT

## 2019-03-20 NOTE — Op Note (Signed)
[facility]  Percutaneous Study/Intervention Procedural Note   Date of Surgery: 03/20/2019  Surgeon(s):Jessie Schrieber    Assistants:none  Pre-operative Diagnosis: PAD with ulceration and gangrenous changes right foot  Post-operative diagnosis:  Same  Procedure(s) Performed:             1.  Ultrasound guidance for vascular access left femoral artery             2.  Catheter placement into right common femoral artery from left femoral approach             3.  Aortogram and selective right lower extremity angiogram             4.  Percutaneous transluminal angioplasty of the right peroneal artery with a 2 mm diameter angioplasty balloon at the foot and ankle, and a 2.5 mm diameter angioplasty balloon throughout the remainder of the peroneal artery             5.  StarClose closure device left femoral artery  EBL: 10 cc  Contrast: 95 cc  Fluoro Time: 8.4 minutes  Moderate Conscious Sedation Time: approximately 45 minutes using 3 mg of Versed and 75 mcg of Fentanyl              Indications:  Patient is a 85 y.o.female with known significant peripheral arterial disease and a nonhealing digital amputation site with necrotic eschar. The patient has noninvasive study showing diminished flow distally. The patient is brought in for angiography for further evaluation and potential treatment.  Due to the limb threatening nature of the situation, angiogram was performed for attempted limb salvage. The patient is aware that if the procedure fails, amputation would be expected.  The patient also understands that even with successful revascularization, amputation may still be required due to the severity of the situation.  Risks and benefits are discussed and informed consent is obtained.   Procedure:  The patient was identified and appropriate procedural time out was performed.  The patient was then placed supine on the table and prepped and draped in the usual sterile fashion.  Moderate conscious sedation was administered during a face to face encounter with the patient throughout the procedure with my supervision of the RN administering medicines and monitoring the patient's vital signs, pulse oximetry, telemetry and mental status throughout from the start of the procedure until the patient was taken to the recovery room. Ultrasound was used to evaluate the left common femoral artery.  It was patent .  A digital ultrasound image was acquired.  A Seldinger needle was used to access the left common femoral artery under direct ultrasound guidance and a permanent image was performed.  A 0.035 J wire was advanced without resistance and a 5Fr sheath was placed.  Pigtail catheter was placed into the aorta and an AP aortogram was performed. This demonstrated normal renal arteries and normal aorta and iliac segments without significant stenosis. I then crossed the aortic bifurcation and advanced to the right femoral head. Selective right lower extremity angiogram was then performed. This demonstrated normal common femoral artery, profunda femoris artery, and superficial femoral artery.  Her popliteal artery and distal SFA had mild stenosis in the 30% range.  Severe tibial disease was present.  The peroneal artery was really the only runoff distally but it was heavily diseased and had a near occlusive stenosis in the distal calf and lower leg area and the previous area of intervention.  There was distal reconstitution with the  lateral tarsal vessel.  The anterior tibial artery was chronically occluded without distal reconstitution.  The posterior tibial artery occluded in the midsegment without distal reconstitution.  Image quality was quite poor due to continuous patient motion. It was felt that it was in the patient's best interest to proceed with intervention after these images to avoid a second procedure and a larger amount of contrast and fluoroscopy based off of the findings from the initial  angiogram. The patient was systemically heparinized and a 5 French 90 cm sheath was then placed over the Magic torque wire. I then used a CXI catheter and 0.018 advantage wire to get down into the peroneal artery and fairly easily cross the occlusion confirming intraluminal flow in the lateral tarsal artery distally.  The CXI catheter was then removed and over the 0.018 wire I used a 2.5 mm diameter by 22 cm length angioplasty balloon inflated to 14 atm for 1 minute going down to almost the ankle.  A 2 mm diameter by 10 cm length angioplasty balloon was then used to inflate across the ankle and into the foot down to the lateral tarsal artery.  This was taken to 12 atm for 1 minute.  Completion imaging showed inline flow into the foot with less than 20% residual stenosis after these angioplasties. I elected to terminate the procedure. The sheath was removed and StarClose closure device was deployed in the left femoral artery with excellent hemostatic result. The patient was taken to the recovery room in stable condition having tolerated the procedure well.  Findings:               Aortogram:  No significant stenosis of the renal arteries.  Aorta and iliac arteries without significant stenosis.             Right lower Extremity:  Reasonably normal common femoral artery, profunda femoris artery, and superficial femoral artery.  Her popliteal artery and distal SFA had mild stenosis in the 30% range.  Severe tibial disease was present.  The peroneal artery was really the only runoff distally but it was heavily diseased and had a near occlusive stenosis in the distal calf and lower leg area and the previous area of intervention.  There was distal reconstitution with the lateral tarsal vessel.  The anterior tibial artery was chronically occluded without distal reconstitution.  The posterior tibial artery occluded in the midsegment without distal reconstitution.  Image quality was quite poor due to continuous patient  motion.   Disposition: Patient was taken to the recovery room in stable condition having tolerated the procedure well.  Complications: None  Leotis Pain 03/20/2019 4:21 PM   This note was created with Dragon Medical transcription system. Any errors in dictation are purely unintentional.

## 2019-03-20 NOTE — Discharge Instructions (Signed)

## 2019-03-20 NOTE — H&P (Signed)
[facility] History & Physical Update  The patient was interviewed and re-examined.  The patient's previous History and Physical has been reviewed and is unchanged.  There is no change in the plan of care. We plan to proceed with the scheduled procedure.  Leotis Pain, MD  03/20/2019, 12:11 PM

## 2019-03-23 ENCOUNTER — Encounter: Payer: Self-pay | Admitting: Vascular Surgery

## 2019-03-30 ENCOUNTER — Encounter: Payer: Medicare Other | Attending: Physician Assistant | Admitting: Physician Assistant

## 2019-03-30 ENCOUNTER — Other Ambulatory Visit: Payer: Self-pay

## 2019-03-30 DIAGNOSIS — I50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 (congestive) heart failure: Secondary | ICD-10-CM | POA: Diagnosis not present

## 2019-03-30 DIAGNOSIS — N183 Chronic kidney disease, stage 3 unspecified: Secondary | ICD-10-CM | POA: Diagnosis not present

## 2019-03-30 DIAGNOSIS — Z992 Dependence on renal dialysis: Secondary | ICD-10-CM | POA: Insufficient documentation

## 2019-03-30 DIAGNOSIS — T8131XA Disruption of external operation (surgical) wound, not elsewhere classified, initial encounter: Secondary | ICD-10-CM | POA: Insufficient documentation

## 2019-03-30 DIAGNOSIS — R1312 Dysphagia, oropharyngeal phase: Secondary | ICD-10-CM | POA: Insufficient documentation

## 2019-03-30 DIAGNOSIS — L97516 Non-pressure chronic ulcer of other part of right foot with bone involvement without evidence of necrosis: Secondary | ICD-10-CM | POA: Insufficient documentation

## 2019-03-30 DIAGNOSIS — I7389 Other specified peripheral vascular diseases: Secondary | ICD-10-CM | POA: Insufficient documentation

## 2019-03-30 DIAGNOSIS — I2510 Atherosclerotic heart disease of native coronary artery without angina pectoris: Secondary | ICD-10-CM | POA: Insufficient documentation

## 2019-03-30 DIAGNOSIS — I132 Hypertensive heart and chronic kidney disease with heart failure and with stage 5 chronic kidney disease, or end stage renal disease: Secondary | ICD-10-CM | POA: Diagnosis not present

## 2019-03-30 DIAGNOSIS — Y838 Other surgical procedures as the cause of abnormal reaction of the patient, or of later complication, without mention of misadventure at the time of the procedure: Secondary | ICD-10-CM | POA: Insufficient documentation

## 2019-03-30 NOTE — Progress Notes (Signed)
Sandy Roberson ([id number]) Visit Report for 03/30/2019 Abuse/Suicide Risk Screen Details Patient Name: Sandy Roberson Date of Service: 03/30/2019 1:15 PM Medical Record Number: [id number] Patient Account Number: [id number] Date of Birth/Sex: July 23, 1915 (85 y.o. F) Treating RN: Montey Hora Primary Care [name]: Tracie Harrier Other Clinician: Referring Maygan Koeller: Referral, Self Treating Jordin Vicencio/Extender: STONE III, HOYT Weeks in Treatment: 0 Abuse/Suicide Risk Screen Items Answer ABUSE RISK SCREEN: Has anyone close to you tried to hurt or harm you recentlyo No Do you feel uncomfortable with anyone in your familyo No Has anyone forced you do things that you didnot want to doo No Electronic Signature(s) Signed: 03/30/2019 3:19:47 PM By: Montey Hora Entered By: Montey Hora on 03/30/2019 13:28:57 Sandy Roberson ([id number]) -------------------------------------------------------------------------------- Activities of Daily Living Details Patient Name: Sandy Roberson Date of Service: 03/30/2019 1:15 PM Medical Record Number: [id number] Patient Account Number: [id number] Date of Birth/Sex: [birth year] (85 y.o. F) Treating RN: Montey Hora Primary Care [name]: Tracie Harrier Other Clinician: Referring Glendola Friedhoff: Referral, Self Treating Jerad Dunlap/Extender: STONE III, HOYT Weeks in Treatment: 0 Activities of Daily Living Items Answer Activities of Daily Living (Please select one for each item) Drive Automobile Not Able Take Medications Need Assistance Use Telephone Need Assistance Care for Appearance Completely Able Use Toilet Completely Able Bath / Shower Need Assistance Dress Self Completely Able Feed Self Completely Able Walk Need Assistance Get In / Out Bed Need Assistance Housework Not Able Prepare Meals Not Able Handle Money Not Able Shop for Self Not Able Electronic Signature(s) Signed: 03/30/2019 3:19:47 PM By: Montey Hora Entered By: Montey Hora on 03/30/2019 13:30:04 Sandy Roberson ([id number]) -------------------------------------------------------------------------------- Education Screening Details Patient Name: Sandy Roberson Date of Service: 03/30/2019 1:15 PM Medical Record Number: [id number] Patient Account Number: [id number] Date of Birth/Sex: 30-Jun-1915 (85 y.o. F) Treating RN: Montey Hora Primary Care [name]: Tracie Harrier Other Clinician: Referring Kharma Sampsel: Referral, Self Treating Louise Rawson/Extender: Melburn Hake, HOYT Weeks in Treatment: 0 Primary Learner Assessed: Patient Learning Preferences/Education Level/Primary Language Learning Preference: Explanation, Demonstration Highest Education Level: High School Preferred Language: English Cognitive Barrier Language Barrier: No Translator Needed: No Memory Deficit: No Emotional Barrier: No Cultural/Religious Beliefs Affecting Medical Care: No Physical Barrier Impaired Vision: No Impaired Hearing: No Decreased Hand dexterity: No Knowledge/Comprehension Knowledge Level: Medium Comprehension Level: Medium Ability to understand written Medium instructions: Ability to understand verbal Medium instructions: Motivation Anxiety Level: Calm Cooperation: Cooperative Education Importance: Acknowledges Need Interest in Health Problems: Asks Questions Perception: Coherent Willingness to Engage in Self- Medium Management Activities: Readiness to Engage in Self- Medium Management Activities: Electronic Signature(s) Signed: 03/30/2019 3:19:47 PM By: Montey Hora Entered By: Montey Hora on 03/30/2019 13:30:25 Sandy Roberson ([id number]) -------------------------------------------------------------------------------- Fall Risk Assessment Details Patient Name: Sandy Roberson Date of Service: 03/30/2019 1:15 PM Medical Record Number: [id number] Patient Account Number: [id number] Date of  Birth/Sex: [birth year] (85 y.o. F) Treating RN: Montey Hora Primary Care Jamariya Davidoff: Tracie Harrier Other Clinician: Referring Teasha Murrillo: Referral, Self Treating Shifa Brisbon/Extender: STONE III, HOYT Weeks in Treatment: 0 Fall Risk Assessment Items Have you had 2 or more falls in the last 12 monthso 0 Yes Have you had any fall that resulted in injury in the last 12 monthso 0 No FALLS RISK SCREEN History of falling - immediate or within 3 months 25 Yes Secondary diagnosis (Do you have 2 or more medical diagnoseso) 0 No Ambulatory aid None/bed rest/wheelchair/nurse 0 Yes Crutches/cane/walker 0 No Furniture 0 No Intravenous therapy Access/Saline/Heparin Lock 0 No Gait/Transferring Normal/ bed rest/ wheelchair 0 No Weak (short steps with or without shuffle, stooped but  able to lift head while 10 Yes walking, may seek support from furniture) Impaired (short steps with shuffle, may have difficulty arising from chair, head 0 No down, impaired balance) Mental Status Oriented to own ability 0 Yes Electronic Signature(s) Signed: 03/30/2019 3:19:47 PM By: Montey Hora Entered By: Montey Hora on 03/30/2019 13:30:55 Sandy Roberson ([id number]) -------------------------------------------------------------------------------- Foot Assessment Details Patient Name: Sandy Roberson Date of Service: 03/30/2019 1:15 PM Medical Record Number: [id number] Patient Account Number: [id number] Date of Birth/Sex: 29-Oct-1915 (85 y.o. F) Treating RN: Montey Hora Primary Care Darina Hartwell: Tracie Harrier Other Clinician: Referring Sonoma Firkus: Referral, Self Treating Takeysha Bonk/Extender: STONE III, HOYT Weeks in Treatment: 0 Foot Assessment Items Site Locations + = Sensation present, - = Sensation absent, C = Callus, U = Ulcer R = Redness, W = Warmth, M = Maceration, PU = Pre-ulcerative lesion F = Fissure, S = Swelling, D = Dryness Assessment Right: Left: Other Deformity: No No Prior Foot  Ulcer: No No Prior Amputation: No No Charcot Joint: No No Ambulatory Status: Non-ambulatory Assistance Device: Wheelchair GaitEnergy manager) Signed: 03/30/2019 3:19:47 PM By: Montey Hora Entered By: Montey Hora on 03/30/2019 13:31:57 Sandy Roberson ([id number]) -------------------------------------------------------------------------------- Nutrition Risk Screening Details Patient Name: Sandy Roberson Date of Service: 03/30/2019 1:15 PM Medical Record Number: [id number] Patient Account Number: [id number] Date of Birth/Sex: 10/26/1915 (85 y.o. F) Treating RN: Montey Hora Primary Care Sydne Krahl: Tracie Harrier Other Clinician: Referring Yahia Bottger: Referral, Self Treating Trinity Hyland/Extender: STONE III, HOYT Weeks in Treatment: 0 Height (in): 63 Weight (lbs): 118 Body Mass Index (BMI): 20.9 Nutrition Risk Screening Items Score Screening NUTRITION RISK SCREEN: I have an illness or condition that made me change the kind and/or amount of 2 Yes food I eat I eat fewer than two meals per day 0 No I eat few fruits and vegetables, or milk products 0 No I have three or more drinks of beer, liquor or wine almost every day 0 No I have tooth or mouth problems that make it hard for me to eat 0 No I don't always have enough money to buy the food I need 0 No I eat alone most of the time 0 No I take three or more different prescribed or over-the-counter drugs a day 1 Yes Without wanting to, I have lost or gained 10 pounds in the last six months 0 No I am not always physically able to shop, cook and/or feed myself 0 No Nutrition Protocols Good Risk Protocol Provide education on Moderate Risk Protocol 0 nutrition High Risk Proctocol Risk Level: Moderate Risk Score: 3 Electronic Signature(s) Signed: 03/30/2019 3:19:47 PM By: Montey Hora Entered By: Montey Hora on 03/30/2019 13:31:44

## 2019-03-30 NOTE — Progress Notes (Signed)
Sandy Roberson, Sandy Roberson ([id number]) Visit Report for 03/30/2019 Allergy List Details Patient Name: Sandy Roberson, Sandy Roberson Date of Service: 03/30/2019 1:15 PM Medical Record Number: [id number] Patient Account Number: [id number] Date of Birth/Sex: 12/08/1915 (85 y.o. F) Treating RN: Montey Hora Primary Care Zaccary Creech: Tracie Harrier Other Clinician: Referring Tylea Hise: Referral, Self Treating Masaye Gatchalian/Extender: STONE III, HOYT Weeks in Treatment: 0 Allergies Active Allergies penicillin Allergy Notes Electronic Signature(s) Signed: 03/30/2019 3:19:47 PM By: Montey Hora Entered By: Montey Hora on 03/30/2019 13:24:19 Sandy Roberson ([id number]) -------------------------------------------------------------------------------- Arrival Information Details Patient Name: Sandy Roberson Date of Service: 03/30/2019 1:15 PM Medical Record Number: [id number] Patient Account Number: [id number] Date of Birth/Sex: [birth year] (85 y.o. F) Treating RN: Montey Hora Primary Care Zenaida Tesar: Tracie Harrier Other Clinician: Referring Aanvi Voyles: Referral, Self Treating Emmer Lillibridge/Extender: Melburn Hake, HOYT Weeks in Treatment: 0 Visit Information Patient Arrived: Wheel Chair Arrival Time: 13:18 Accompanied By: son Transfer Assistance: Manual Patient Identification Verified: Yes Secondary Verification Process Yes Completed: Patient Has Alerts: Yes Patient Alerts: Patient on Blood Thinner aspirin 81 03/10/19 ABI L .77 R .67 TBI L .24 R .16 Electronic Signature(s) Signed: 03/30/2019 3:19:47 PM By: Montey Hora Entered By: Montey Hora on 03/30/2019 13:20:37 Sandy Roberson ([id number]) -------------------------------------------------------------------------------- Clinic Level of Care Assessment Details Patient Name: Sandy Roberson Date of Service: 03/30/2019 1:15 PM Medical Record Number: [id number] Patient Account Number: [id number] Date of Birth/Sex: February 28, 1916  (85 y.o. F) Treating RN: Harold Barban Primary Care Kennedey Digilio: Tracie Harrier Other Clinician: Referring Jayshawn Colston: Referral, Self Treating Yajayra Feldt/Extender: STONE III, HOYT Weeks in Treatment: 0 Clinic Level of Care Assessment Items TOOL 2 Quantity Score []  - Use when only an EandM is performed on the INITIAL visit 0 ASSESSMENTS - Nursing Assessment / Reassessment X - General Physical Exam (combine w/ comprehensive assessment (listed just below) when 1 20 performed on new pt. evals) X- 1 25 Comprehensive Assessment (HX, ROS, Risk Assessments, Wounds Hx, etc.) ASSESSMENTS - Wound and Skin Assessment / Reassessment X - Simple Wound Assessment / Reassessment - one wound 1 5 []  - 0 Complex Wound Assessment / Reassessment - multiple wounds []  - 0 Dermatologic / Skin Assessment (not related to wound area) ASSESSMENTS - Ostomy and/or Continence Assessment and Care []  - Incontinence Assessment and Management 0 []  - 0 Ostomy Care Assessment and Management (repouching, etc.) PROCESS - Coordination of Care X - Simple Patient / Family Education for ongoing care 1 15 []  - 0 Complex (extensive) Patient / Family Education for ongoing care []  - 0 Staff obtains Programmer, systems, Records, Test Results / Process Orders X- 1 10 Staff telephones HHA, Nursing Homes / Clarify orders / etc []  - 0 Routine Transfer to another Facility (non-emergent condition) []  - 0 Routine Hospital Admission (non-emergent condition) []  - 0 New Admissions / Biomedical engineer / Ordering NPWT, Apligraf, etc. []  - 0 Emergency Hospital Admission (emergent condition) X- 1 10 Simple Discharge Coordination []  - 0 Complex (extensive) Discharge Coordination PROCESS - Special Needs []  - Pediatric / Minor Patient Management 0 []  - 0 Isolation Patient Management Sandy Roberson, Sandy Roberson ([id number]) []  - 0 Hearing / Language / Visual special needs []  - 0 Assessment of Community assistance (transportation, D/C  planning, etc.) []  - 0 Additional assistance / Altered mentation []  - 0 Support Surface(s) Assessment (bed, cushion, seat, etc.) INTERVENTIONS - Wound Cleansing / Measurement X - Wound Imaging (photographs - any number of wounds) 1 5 []  - 0 Wound Tracing (instead of photographs) X- 1 5 Simple Wound Measurement - one wound []  - 0 Complex Wound Measurement - multiple  wounds X- 1 5 Simple Wound Cleansing - one wound []  - 0 Complex Wound Cleansing - multiple wounds INTERVENTIONS - Wound Dressings X - Small Wound Dressing one or multiple wounds 1 10 []  - 0 Medium Wound Dressing one or multiple wounds []  - 0 Large Wound Dressing one or multiple wounds []  - 0 Application of Medications - injection INTERVENTIONS - Miscellaneous []  - External ear exam 0 []  - 0 Specimen Collection (cultures, biopsies, blood, body fluids, etc.) []  - 0 Specimen(s) / Culture(s) sent or taken to Lab for analysis []  - 0 Patient Transfer (multiple staff / Civil Service fast streamer / Similar devices) []  - 0 Simple Staple / Suture removal (25 or less) []  - 0 Complex Staple / Suture removal (26 or more) []  - 0 Hypo / Hyperglycemic Management (close monitor of Blood Glucose) []  - 0 Ankle / Brachial Index (ABI) - do not check if billed separately Has the patient been seen at the hospital within the last three years: Yes Total Score: 110 Level Of Care: New/Established - Level 3 Electronic Signature(s) Signed: 03/30/2019 4:05:06 PM By: Harold Barban Entered By: Harold Barban on 03/30/2019 14:13:47 Sandy Roberson ([id number]) -------------------------------------------------------------------------------- Encounter Discharge Information Details Patient Name: Sandy Roberson Date of Service: 03/30/2019 1:15 PM Medical Record Number: [id number] Patient Account Number: [id number] Date of Birth/Sex: Sep 03, 1915 (85 y.o. F) Treating RN: Harold Barban Primary Care Venesha Petraitis: Tracie Harrier Other  Clinician: Referring Raihan Kimmel: Referral, Self Treating Abdoulie Tierce/Extender: Melburn Hake, HOYT Weeks in Treatment: 0 Encounter Discharge Information Items Post Procedure Vitals Discharge Condition: Stable Temperature (F): 97.6 Ambulatory Status: Wheelchair Pulse (bpm): 75 Discharge Destination: Home Respiratory Rate (breaths/min): 16 Transportation: Private Auto Blood Pressure (mmHg): 164/72 Accompanied By: family Schedule Follow-up Appointment: Yes Clinical Summary of Care: Electronic Signature(s) Signed: 03/30/2019 4:05:06 PM By: Harold Barban Entered By: Harold Barban on 03/30/2019 14:26:22 Sandy Roberson ([id number]) -------------------------------------------------------------------------------- Lower Extremity Assessment Details Patient Name: Sandy Roberson Date of Service: 03/30/2019 1:15 PM Medical Record Number: [id number] Patient Account Number: [id number] Date of Birth/Sex: 08-Mar-1916 (85 y.o. F) Treating RN: Montey Hora Primary Care Kaiya Boatman: Tracie Harrier Other Clinician: Referring Rankin Coolman: Referral, Self Treating Fernado Brigante/Extender: STONE III, HOYT Weeks in Treatment: 0 Edema Assessment Assessed: [Left: No] [Right: No] Edema: [Left: Yes] [Right: Yes] Calf Left: Right: Point of Measurement: 32 cm From Medial Instep 34 cm 36 cm Ankle Left: Right: Point of Measurement: 10 cm From Medial Instep 22.5 cm 23 cm Vascular Assessment Pulses: Dorsalis Pedis Palpable: [Left:No] [Right:No] Doppler Audible: [Left:Yes] [Right:Yes] Posterior Tibial Palpable: [Left:No Yes] [Right:No Yes] Electronic Signature(s) Signed: 03/30/2019 3:19:47 PM By: Montey Hora Entered By: Montey Hora on 03/30/2019 13:48:13 Sandy Roberson ([id number]) -------------------------------------------------------------------------------- Multi Wound Chart Details Patient Name: Sandy Roberson Date of Service: 03/30/2019 1:15 PM Medical Record Number:  [id number] Patient Account Number: [id number] Date of Birth/Sex: [birth year] (85 y.o. F) Treating RN: Harold Barban Primary Care Kenny Rea: Tracie Harrier Other Clinician: Referring Oday Ridings: Referral, Self Treating Shericka Johnstone/Extender: STONE III, HOYT Weeks in Treatment: 0 Vital Signs Height(in): 63 Pulse(bpm): 75 Weight(lbs): 118 Blood Pressure(mmHg): 164/72 Body Mass Index(BMI): 21 Temperature(F): 97.6 Respiratory Rate 16 (breaths/min): Photos: [N/A:N/A] Wound Location: Right Metatarsal head first - N/A N/A Medial Wounding Event: Gradually Appeared N/A N/A Primary Etiology: Gangrene of Digits/ Extremity, N/A N/A Non-Diabetic Comorbid History: Anemia, Congestive Heart N/A N/A Failure, Coronary Artery Disease, Hypertension, Peripheral Arterial Disease, Peripheral Venous Disease, End Stage Renal Disease Date Acquired: 03/16/2018 N/A N/A Weeks of Treatment: 0 N/A N/A Wound Status: Open N/A N/A Pending Amputation on Yes N/A N/A Presentation:  Measurements L x W x D 4x3.4x2 N/A N/A (cm) Area (cm) : 10.681 N/A N/A Volume (cm) : 21.363 N/A N/A Classification: Full Thickness With Exposed N/A N/A Support Structures Exudate Amount: Medium N/A N/A Exudate Type: Serous N/A N/A Exudate Color: amber N/A N/A Foul Odor After Cleansing: Yes N/A N/A Odor Anticipated Due to No N/A N/A Product Use: Wound Margin: Flat and Intact N/A N/A Sandy Roberson, Sandy Roberson ([id number]) Granulation Amount: Small (1-33%) N/A N/A Granulation Quality: Pink N/A N/A Necrotic Amount: Large (67-100%) N/A N/A Necrotic Tissue: Eschar, Adherent Slough N/A N/A Exposed Structures: Fat Layer (Subcutaneous N/A N/A Tissue) Exposed: Yes Muscle: Yes Fascia: No Tendon: No Joint: No Bone: No Epithelialization: None N/A N/A Treatment Notes Electronic Signature(s) Signed: 03/30/2019 4:05:06 PM By: Harold Barban Entered By: Harold Barban on 03/30/2019 14:12:04 Sandy Roberson  ([id number]) -------------------------------------------------------------------------------- Multi-Disciplinary Care Plan Details Patient Name: Sandy Roberson Date of Service: 03/30/2019 1:15 PM Medical Record Number: [id number] Patient Account Number: [id number] Date of Birth/Sex: [birth year] (85 y.o. F) Treating RN: Harold Barban Primary Care Izamar Linden: Tracie Harrier Other Clinician: Referring Mehar Kirkwood: Referral, Self Treating Author Hatlestad/Extender: STONE III, HOYT Weeks in Treatment: 0 Active Inactive Venous Leg Ulcer Nursing Diagnoses: Knowledge deficit related to disease process and management Goals: Patient/caregiver will verbalize understanding of disease process and disease management Date Initiated: 03/30/2019 Target Resolution Date: 03/30/2019 Goal Status: Active Interventions: Assess peripheral edema status every visit. Notes: Wound/Skin Impairment Nursing Diagnoses: Impaired tissue integrity Knowledge deficit related to ulceration/compromised skin integrity Goals: Ulcer/skin breakdown will have a volume reduction of 30% by week 4 Date Initiated: 03/30/2019 Target Resolution Date: 04/30/2019 Goal Status: Active Interventions: Assess patient/caregiver ability to obtain necessary supplies Assess patient/caregiver ability to perform ulcer/skin care regimen upon admission and as needed Assess ulceration(s) every visit Provide education on ulcer and skin care Notes: Electronic Signature(s) Signed: 03/30/2019 4:05:06 PM By: Harold Barban Entered By: Harold Barban on 03/30/2019 14:11:50 Sandy Roberson ([id number]) -------------------------------------------------------------------------------- Pain Assessment Details Patient Name: Sandy Roberson Date of Service: 03/30/2019 1:15 PM Medical Record Number: [id number] Patient Account Number: [id number] Date of Birth/Sex: 12/09/1915 (85 y.o. F) Treating RN: Montey Hora Primary Care Yuji Walth: Tracie Harrier Other Clinician: Referring Silvina Hackleman: Referral, Self Treating Woodrow Drab/Extender: STONE III, HOYT Weeks in Treatment: 0 Active Problems Location of Pain Severity and Description of Pain Patient Has Paino No Site Locations Pain Management and Medication Current Pain Management: Electronic Signature(s) Signed: 03/30/2019 3:19:47 PM By: Montey Hora Entered By: Montey Hora on 03/30/2019 13:21:32 Sandy Roberson ([id number]) -------------------------------------------------------------------------------- Patient/Caregiver Education Details Patient Name: Sandy Roberson Date of Service: 03/30/2019 1:15 PM Medical Record Number: [id number] Patient Account Number: [id number] Date of Birth/Gender: 1915/05/30 (85 y.o. F) Treating RN: Harold Barban Primary Care Physician: Tracie Harrier Other Clinician: Referring Physician: Referral, Self Treating Physician/Extender: Melburn Hake, HOYT Weeks in Treatment: 0 Education Assessment Education Provided To: Patient Education Topics Provided Wound/Skin Impairment: Handouts: Caring for Your Ulcer Methods: Demonstration, Explain/Verbal Responses: State content correctly Electronic Signature(s) Signed: 03/30/2019 4:05:06 PM By: Harold Barban Entered By: Harold Barban on 03/30/2019 14:12:23 Sandy Roberson ([id number]) -------------------------------------------------------------------------------- Wound Assessment Details Patient Name: Sandy Roberson Date of Service: 03/30/2019 1:15 PM Medical Record Number: [id number] Patient Account Number: [id number] Date of Birth/Sex: December 07, 1915 (85 y.o. F) Treating RN: Montey Hora Primary Care Ashritha Desrosiers: Tracie Harrier Other Clinician: Referring Rafia Shedden: Referral, Self Treating Shephanie Romas/Extender: STONE III, HOYT Weeks in Treatment: 0 Wound Status Wound Number: 1 Primary Gangrene of Digits/ Extremity, Non-Diabetic Etiology: Wound Location: Right Metatarsal  head first - Medial Wound Open Wounding Event: Gradually Appeared  Status: Date Acquired: 03/16/2018 Comorbid Anemia, Congestive Heart Failure, Coronary Weeks Of Treatment: 0 History: Artery Disease, Hypertension, Peripheral Arterial Clustered Wound: No Disease, Peripheral Venous Disease, End Stage Pending Amputation On Presentation Renal Disease Photos Wound Measurements Length: (cm) 4 Width: (cm) 3.4 Depth: (cm) 2 Area: (cm) 10.681 Volume: (cm) 21.363 % Reduction in Area: % Reduction in Volume: Epithelialization: None Tunneling: No Undermining: No Wound Description Full Thickness With Exposed Support Classification: Structures Wound Margin: Flat and Intact Exudate Medium Amount: Exudate Type: Serous Exudate Color: amber Foul Odor After Cleansing: Yes Due to Product Use: No Slough/Fibrino Yes Wound Bed Granulation Amount: Small (1-33%) Exposed Structure Granulation Quality: Pink Fascia Exposed: No Necrotic Amount: Large (67-100%) Fat Layer (Subcutaneous Tissue) Exposed: Yes Necrotic Quality: Eschar, Adherent Slough Tendon Exposed: No Muscle Exposed: Yes Necrosis of Muscle: Yes Joint Exposed: No Sandy Roberson, Sandy Roberson ([id number]) Bone Exposed: No Electronic Signature(s) Signed: 03/30/2019 3:19:47 PM By: Montey Hora Entered By: Montey Hora on 03/30/2019 13:41:13 Sandy Roberson ([id number]) -------------------------------------------------------------------------------- Vitals Details Patient Name: Sandy Roberson Date of Service: 03/30/2019 1:15 PM Medical Record Number: [id number] Patient Account Number: [id number] Date of Birth/Sex: 1915/04/22 (85 y.o. F) Treating RN: Montey Hora Primary Care Makinzy Cleere: Tracie Harrier Other Clinician: Referring Zowie Lundahl: Referral, Self Treating Sarah-Jane Nazario/Extender: STONE III, HOYT Weeks in Treatment: 0 Vital Signs Time Taken: 13:21 Temperature (F): 97.6 Height (in): 63 Pulse (bpm): 75 Source:  Measured Respiratory Rate (breaths/min): 16 Weight (lbs): 118 Blood Pressure (mmHg): 164/72 Source: Measured Reference Range: 80 - 120 mg / dl Body Mass Index (BMI): 20.9 Electronic Signature(s) Signed: 03/30/2019 3:19:47 PM By: Montey Hora Entered By: Montey Hora on 03/30/2019 13:23:39

## 2019-03-31 NOTE — Progress Notes (Signed)
BRITLYN, MCNAMER ([id number]) Visit Report for 03/30/2019 Chief Complaint Document Details Patient Name: Sandy Roberson, Sandy Roberson Date of Service: 03/30/2019 1:15 PM Medical Record Number: [id number] Patient Account Number: [id number] Date of Birth/Sex: 03-Feb-1916 (85 y.o. F) Treating RN: Harold Barban Primary Care Provider: Tracie Harrier Other Clinician: Referring Provider: Referral, Self Treating Provider/Extender: Melburn Hake, Tobiah Celestine Weeks in Treatment: 0 Information Obtained from: Patient Chief Complaint Right medial foot surgical ulcer Electronic Signature(s) Signed: 03/30/2019 1:58:11 PM By: Worthy Keeler PA-C Entered By: Worthy Keeler on 03/30/2019 13:58:10 Sandy Roberson ([id number]) -------------------------------------------------------------------------------- Debridement Details Patient Name: Sandy Roberson Date of Service: 03/30/2019 1:15 PM Medical Record Number: [id number] Patient Account Number: [id number] Date of Birth/Sex: Jul 20, 1915 (85 y.o. F) Treating RN: Harold Barban Primary Care Provider: Tracie Harrier Other Clinician: Referring Provider: Referral, Self Treating Provider/Extender: STONE III, Evyn Kooyman Weeks in Treatment: 0 Debridement Performed for Wound #1 Right,Medial Metatarsal head first Assessment: Performed By: Physician STONE III, Corbin Hott E., PA-C Debridement Type: Debridement Severity of Tissue Pre Fat layer exposed Debridement: Level of Consciousness (Pre- Awake and Alert procedure): Pre-procedure Verification/Time Yes - 14:17 Out Taken: Start Time: 14:17 Pain Control: Lidocaine Total Area Debrided (L x W): 4 (cm) x 3.4 (cm) = 13.6 (cm) Tissue and other material Viable, Non-Viable, Muscle, Slough, Subcutaneous, Skin: Epidermis, Slough debrided: Level: Skin/Subcutaneous Tissue/Muscle Debridement Description: Excisional Instrument: Forceps, Scissors Bleeding: None End Time: 14:20 Procedural Pain: 0 Post Procedural Pain:  0 Response to Treatment: Procedure was tolerated well Level of Consciousness Awake and Alert (Post-procedure): Post Debridement Measurements of Total Wound Length: (cm) 4 Width: (cm) 3.4 Depth: (cm) 2 Volume: (cm) 21.363 Character of Wound/Ulcer Post Debridement: Improved Severity of Tissue Post Debridement: Fat layer exposed Post Procedure Diagnosis Same as Pre-procedure Electronic Signature(s) Signed: 03/30/2019 4:05:06 PM By: Harold Barban Signed: 03/30/2019 5:30:09 PM By: Worthy Keeler PA-C Entered By: Harold Barban on 03/30/2019 14:20:11 Sandy Roberson ([id number]) -------------------------------------------------------------------------------- HPI Details Patient Name: Sandy Roberson Date of Service: 03/30/2019 1:15 PM Medical Record Number: [id number] Patient Account Number: [id number] Date of Birth/Sex: 1915/08/05 (85 y.o. F) Treating RN: Harold Barban Primary Care Provider: Tracie Harrier Other Clinician: Referring Provider: Referral, Self Treating Provider/Extender: Melburn Hake, Marcial Pless Weeks in Treatment: 0 History of Present Illness HPI Description: 03/30/2019 on evaluation today patient presents for initial evaluation here in our clinic as a referral from Dr. Selina Cooley office who is a local podiatrist. Unfortunately the patient has experienced significant issues with decreased ability to heal in regard to a surgical site and again this is a fairly complicated history. The patient has undergone 3 vascular interventions the most recent of which was actually performed on 12//20. This was subsequent to the patient having ABIs which were somewhat poor registering at 0.67 on the right with a TBI of 0.16 and on the left she was 0.77 with a TBI of 0.24. Subsequently the patient had had surgery to actually remove a portion of the first metatarsal on her right foot. They did not remove the toe although again from the patient's standpoint I am not 100% sure  exactly the reasoning behind what was done or was not done but again I think a big part of the issue was trying to be as minimally invasive for the patient as possible do #1 to her arterial status #2 due to her age. Nonetheless unfortunately she has had some trouble with healing and when the sutures were removed the wound apparently dehisced. The patient does currently have more recent vascular intervention which again was on December 4  with Dr. Lazaro Arms and at this point the patient did have angioplasty which upon completion showed inline flow into the foot with less than 20% residual stenosis after angioplasties. There were multiple sites which had to be worked on during the procedure by Dr. Lucky Cowboy. Again the patient has very problematic blood flow with regard to her arterial status at this point. She does have a history of dysphagia, stage III kidney disease, and congestive heart failure as well. Electronic Signature(s) Signed: 03/30/2019 5:24:46 PM By: Worthy Keeler PA-C Entered By: Worthy Keeler on 03/30/2019 17:24:46 VERL, DEVINE ([id number]) -------------------------------------------------------------------------------- Physical Exam Details Patient Name: Sandy Roberson Date of Service: 03/30/2019 1:15 PM Medical Record Number: [id number] Patient Account Number: [id number] Date of Birth/Sex: 1915/05/27 (85 y.o. F) Treating RN: Harold Barban Primary Care Provider: Tracie Harrier Other Clinician: Referring Provider: Referral, Self Treating Provider/Extender: STONE III, Brittyn Salaz Weeks in Treatment: 0 Constitutional patient is hypertensive.. pulse regular and within target range for patient.Marland Kitchen respirations regular, non-labored and within target range for patient.Marland Kitchen temperature within target range for patient.. Well-nourished and well-hydrated in no acute distress. Eyes conjunctiva clear no eyelid edema noted. pupils equal round and reactive to light and accommodation. Ears, Nose,  Mouth, and Throat no gross abnormality of ear auricles or external auditory canals. normal hearing noted during conversation. mucus membranes moist. Respiratory normal breathing without difficulty. clear to auscultation bilaterally. Cardiovascular regular rate and rhythm with normal S1, S2. Absent posterior tibial and dorsalis pedis pulses bilateral lower extremities Upon palpation however we were able to get pulses by Doppler.. trace pitting edema of the bilateral lower extremities. Gastrointestinal (GI) soft, non-tender, non-distended, +BS. no ventral hernia noted. Musculoskeletal normal gait and posture. no significant deformity or arthritic changes, no loss or range of motion, no clubbing. Psychiatric this patient is able to make decisions and demonstrates good insight into disease process. Alert and Oriented x 3. pleasant and cooperative. Notes Upon inspection patient's wound bed actually showed signs of fairly good blood flow seemingly into the foot at this point I do not see any dusky appearance and while again pulses were not palpable we were able to obtain them by Doppler. With that being said the patient really is not having any pain which is good news she is very stressed however about the situation at this point. She is worried about her foot. I completely understand. Obviously I think her age is playing a role into this some this scenario to some degree. Obviously I think she knows what is going on to some degree but I think it some level she is also struggling with what exactly is happening to her foot. Electronic Signature(s) Signed: 03/30/2019 5:26:32 PM By: Worthy Keeler PA-C Entered By: Worthy Keeler on 03/30/2019 17:26:31 Sandy Roberson ([id number]) -------------------------------------------------------------------------------- Physician Orders Details Patient Name: Sandy Roberson Date of Service: 03/30/2019 1:15 PM Medical Record Number:  [id number] Patient Account Number: [id number] Date of Birth/Sex: 11-26-1915 (85 y.o. F) Treating RN: Harold Barban Primary Care Provider: Tracie Harrier Other Clinician: Referring Provider: Referral, Self Treating Provider/Extender: Melburn Hake, Jakyra Kenealy Weeks in Treatment: 0 Verbal / Phone Orders: No Diagnosis Coding ICD-10 Coding Code Description I73.89 Other specified peripheral vascular diseases T81.31XA Disruption of external operation (surgical) wound, not elsewhere classified, initial encounter L97.513 Non-pressure chronic ulcer of other part of right foot with necrosis of muscle R13.12 Dysphagia, oropharyngeal phase N18.30 Chronic kidney disease, stage 3 unspecified I50.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 (congestive) heart failure Wound Cleansing Wound #1 Right,Medial Metatarsal head  first o Dial antibacterial soap, wash wounds, rinse and pat dry prior to dressing wounds Primary Wound Dressing Wound #1 Right,Medial Metatarsal head first o Iodoflex Secondary Dressing Wound #1 Right,Medial Metatarsal head first o ABD and Kerlix/Conform - Conform o Drawtex - Place Drawtex right behind iodoflex Dressing Change Frequency Wound #1 Right,Medial Metatarsal head first o Change Dressing Monday, Wednesday, Friday Follow-up Appointments Wound #1 Right,Medial Metatarsal head first o Return Appointment in 1 week. Home Health Wound #1 Right,Medial Metatarsal head first o [place] for [place] to change Wednesdays and Fridays o Continue Home Health Visits o Home Health Nurse may visit PRN to address patientos wound care needs. o FACE TO FACE ENCOUNTER: MEDICARE and MEDICAID PATIENTS: I certify that this patient is under my care and that I had a face-to-face encounter that meets the physician face-to-face encounter requirements with this patient on this date. The encounter with the patient was in whole or in part  for the following MEDICAL CONDITION: (primary reason for [place]) MEDICAL NECESSITY: I certify, that based on my findings, NURSING services are a medically necessary home health service. HOME BOUND STATUS: I certify that my clinical findings support that this patient is homebound (i.e., Due to illness or injury, pt requires aid of LOUANA, AKIN ([id number]) supportive devices such as crutches, cane, wheelchairs, walkers, the use of special transportation or the assistance of another person to leave their place of residence. There is a normal inability to leave the home and doing so requires considerable and taxing effort. Other absences are for medical reasons / religious services and are infrequent or of short duration when for other reasons). o If current dressing causes regression in wound condition, may D/C ordered dressing product/s and apply Normal Saline Moist Dressing daily until next [place] / Other MD appointment. [place] of regression in wound condition at [phone]. o Please direct any NON-WOUND related issues/requests for orders to patient's Primary Care Physician Electronic Signature(s) Signed: 03/30/2019 4:05:06 PM By: Harold Barban Signed: 03/30/2019 5:30:09 PM By: Worthy Keeler PA-C Entered By: Harold Barban on 03/30/2019 14:24:46 Sandy Roberson ([id number]) -------------------------------------------------------------------------------- Problem List Details Patient Name: Sandy Roberson Date of Service: 03/30/2019 1:15 PM Medical Record Number: [id number] Patient Account Number: [id number] Date of Birth/Sex: 1916-01-24 (85 y.o. F) Treating RN: Harold Barban Primary Care Provider: Tracie Harrier Other Clinician: Referring Provider: Referral, Self Treating Provider/Extender: Melburn Hake, Ephriam Turman Weeks in Treatment: 0 Active Problems ICD-10 Evaluated Encounter Code Description Active Date Today  Diagnosis I73.89 Other specified peripheral vascular diseases 03/30/2019 No Yes T81.31XA Disruption of external operation (surgical) wound, not 03/30/2019 No Yes elsewhere classified, initial encounter L97.513 Non-pressure chronic ulcer of other part of right foot with 03/30/2019 No Yes necrosis of muscle R13.12 Dysphagia, oropharyngeal phase 03/30/2019 No Yes N18.30 Chronic kidney disease, stage 3 unspecified 03/30/2019 No Yes I50.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 0000000 No Yes (congestive) heart failure Inactive Problems Resolved Problems Electronic Signature(s) Signed: 03/30/2019 1:57:50 PM By: Worthy Keeler PA-C Entered By: Worthy Keeler on 03/30/2019 13:57:48 Trinkle, Lorenso Courier ([id number]) -------------------------------------------------------------------------------- Progress Note Details Patient Name: Sandy Roberson Date of Service: 03/30/2019 1:15 PM Medical Record Number: [id number] Patient Account Number: [id number] Date of Birth/Sex: November 20, 1915 (85 y.o. F) Treating RN: Harold Barban Primary Care Provider: Tracie Harrier Other Clinician: Referring Provider: Referral, Self Treating Provider/Extender: Melburn Hake, Rhiannan Kievit Weeks in Treatment: 0 Subjective Chief Complaint Information obtained from Patient Right medial foot surgical ulcer History of Present Illness (  HPI) 03/30/2019 on evaluation today patient presents for initial evaluation here in our clinic as a referral from Dr. Selina Cooley office who is a local podiatrist. Unfortunately the patient has experienced significant issues with decreased ability to heal in regard to a surgical site and again this is a fairly complicated history. The patient has undergone 3 vascular interventions the most recent of which was actually performed on 12//20. This was subsequent to the patient having ABIs which were somewhat poor registering at 0.67 on the right with a TBI of 0.16 and on the left she  was 0.77 with a TBI of 0.24. Subsequently the patient had had surgery to actually remove a portion of the first metatarsal on her right foot. They did not remove the toe although again from the patient's standpoint I am not 100% sure exactly the reasoning behind what was done or was not done but again I think a big part of the issue was trying to be as minimally invasive for the patient as possible do #1 to her arterial status #2 due to her age. Nonetheless unfortunately she has had some trouble with healing and when the sutures were removed the wound apparently dehisced. The patient does currently have more recent vascular intervention which again was on December 4 with Dr. Lazaro Arms and at this point the patient did have angioplasty which upon completion showed inline flow into the foot with less than 20% residual stenosis after angioplasties. There were multiple sites which had to be worked on during the procedure by Dr. Lucky Cowboy. Again the patient has very problematic blood flow with regard to her arterial status at this point. She does have a history of dysphagia, stage III kidney disease, and congestive heart failure as well. Patient History Allergies penicillin Family History Heart Disease - Mother,Father, Hypertension - Mother,Father, No family history of Cancer, Diabetes, Hereditary Spherocytosis, Kidney Disease, Lung Disease, Seizures, Stroke, Thyroid Problems, Tuberculosis. Social History Never smoker, Marital Status - Widowed, Alcohol Use - Never, Drug Use - No History, Caffeine Use - Moderate. Medical History Ear/Nose/Mouth/Throat Denies history of Chronic sinus problems/congestion, Middle ear problems Hematologic/Lymphatic Patient has history of Anemia Denies history of Hemophilia, Human Immunodeficiency Virus, Lymphedema, Sickle Cell Disease Cardiovascular Patient has history of Congestive Heart Failure, Coronary Artery Disease, Hypertension, Peripheral Arterial Disease, Peripheral  Venous Disease Denies history of Angina, Arrhythmia, Deep Vein Thrombosis, Hypotension, Myocardial Infarction, Phlebitis, Vasculitis Endocrine Denies history of Type I Diabetes, Type II Diabetes EUNA, KENDER ([id number]) Genitourinary Patient has history of End Stage Renal Disease - CKD stage 3 Integumentary (Skin) Denies history of History of Burn, History of pressure wounds Medical And Surgical History Notes Ear/Nose/Mouth/Throat dysphagia Review of Systems (ROS) Constitutional Symptoms (General Health) Denies complaints or symptoms of Fatigue, Fever, Chills, Marked Weight Change. Eyes Denies complaints or symptoms of Dry Eyes, Vision Changes, Glasses / Contacts. Ear/Nose/Mouth/Throat Denies complaints or symptoms of Difficult clearing ears, Sinusitis. Hematologic/Lymphatic Denies complaints or symptoms of Bleeding / Clotting Disorders, Human Immunodeficiency Virus. Respiratory Denies complaints or symptoms of Chronic or frequent coughs, Shortness of Breath. Cardiovascular Complains or has symptoms of LE edema. Denies complaints or symptoms of Chest pain. Gastrointestinal Denies complaints or symptoms of Frequent diarrhea, Nausea, Vomiting. Endocrine Complains or has symptoms of Thyroid disease. Denies complaints or symptoms of Hepatitis, Polydypsia (Excessive Thirst). Genitourinary Complains or has symptoms of Kidney failure/ Dialysis - CKD stage 3, Incontinence/dribbling. Immunological Denies complaints or symptoms of Hives, Itching. Integumentary (Skin) Complains or has symptoms of Wounds. Denies complaints or symptoms of  Bleeding or bruising tendency, Breakdown, Swelling. Musculoskeletal Denies complaints or symptoms of Muscle Pain, Muscle Weakness. Neurologic Denies complaints or symptoms of Numbness/parasthesias, Focal/Weakness. Psychiatric Denies complaints or symptoms of Anxiety, Claustrophobia. Objective Constitutional patient is hypertensive.. pulse  regular and within target range for patient.Marland Kitchen respirations regular, non-labored and within target range for patient.Marland Kitchen temperature within target range for patient.. Well-nourished and well-hydrated in no acute distress. Vitals Time Taken: 1:21 PM, Height: 63 in, Source: Measured, Weight: 118 lbs, Source: Measured, BMI: 20.9, Temperature: 97.6 F, Pulse: 75 bpm, Respiratory Rate: 16 breaths/min, Blood Pressure: 164/72 mmHg. ELOYSE, HIBMA ([id number]) Eyes conjunctiva clear no eyelid edema noted. pupils equal round and reactive to light and accommodation. Ears, Nose, Mouth, and Throat no gross abnormality of ear auricles or external auditory canals. normal hearing noted during conversation. mucus membranes moist. Respiratory normal breathing without difficulty. clear to auscultation bilaterally. Cardiovascular regular rate and rhythm with normal S1, S2. Absent posterior tibial and dorsalis pedis pulses bilateral lower extremities Upon palpation however we were able to get pulses by Doppler.. trace pitting edema of the bilateral lower extremities. Gastrointestinal (GI) soft, non-tender, non-distended, +BS. no ventral hernia noted. Musculoskeletal normal gait and posture. no significant deformity or arthritic changes, no loss or range of motion, no clubbing. Psychiatric this patient is able to make decisions and demonstrates good insight into disease process. Alert and Oriented x 3. pleasant and cooperative. General Notes: Upon inspection patient's wound bed actually showed signs of fairly good blood flow seemingly into the foot at this point I do not see any dusky appearance and while again pulses were not palpable we were able to obtain them by Doppler. With that being said the patient really is not having any pain which is good news she is very stressed however about the situation at this point. She is worried about her foot. I completely understand. Obviously I think her age is playing  a role into this some this scenario to some degree. Obviously I think she knows what is going on to some degree but I think it some level she is also struggling with what exactly is happening to her foot. Integumentary (Hair, Skin) Wound #1 status is Open. Original cause of wound was Gradually Appeared. The wound is located on the Right,Medial Metatarsal head first. The wound measures 4cm length x 3.4cm width x 2cm depth; 10.681cm^2 area and 21.363cm^3 volume. There is muscle and Fat Layer (Subcutaneous Tissue) Exposed exposed. There is no tunneling or undermining noted. There is a medium amount of serous drainage noted. Foul odor after cleansing was noted. The wound margin is flat and intact. There is small (1-33%) pink granulation within the wound bed. There is a large (67-100%) amount of necrotic tissue within the wound bed including Eschar, Adherent Slough and Necrosis of Muscle. Assessment Active Problems ICD-10 Other specified peripheral vascular diseases Disruption of external operation (surgical) wound, not elsewhere classified, initial encounter Non-pressure chronic ulcer of other part of right foot with necrosis of muscle Dysphagia, oropharyngeal phase Chronic kidney disease, stage 3 unspecified Chronic combined systolic (congestive) and diastolic (congestive) heart failure Dieguez, Julian ([id number]) Procedures Wound #1 Pre-procedure diagnosis of Wound #1 is an Arterial Insufficiency Ulcer located on the Right,Medial Metatarsal head first .Severity of Tissue Pre Debridement is: Fat layer exposed. There was a Excisional Skin/Subcutaneous Tissue/Muscle Debridement with a total area of 13.6 sq cm performed by STONE III, Hilberto Burzynski E., PA-C. With the following instrument(s): Forceps, and Scissors to remove Viable and Non-Viable tissue/material. Material removed includes  Muscle, Subcutaneous Tissue, Slough, and Skin: Epidermis after achieving pain control using Lidocaine. A time out  was conducted at 14:17, prior to the start of the procedure. There was no bleeding. The procedure was tolerated well with a pain level of 0 throughout and a pain level of 0 following the procedure. Post Debridement Measurements: 4cm length x 3.4cm width x 2cm depth; 21.363cm^3 volume. Character of Wound/Ulcer Post Debridement is improved. Severity of Tissue Post Debridement is: Fat layer exposed. Post procedure Diagnosis Wound #1: Same as Pre-Procedure Plan Wound Cleansing: Wound #1 Right,Medial Metatarsal head first: Dial antibacterial soap, wash wounds, rinse and pat dry prior to dressing wounds Primary Wound Dressing: Wound #1 Right,Medial Metatarsal head first: Iodoflex Secondary Dressing: Wound #1 Right,Medial Metatarsal head first: ABD and Kerlix/Conform - Conform Drawtex - Place Drawtex right behind iodoflex Dressing Change Frequency: Wound #1 Right,Medial Metatarsal head first: Change Dressing Monday, Wednesday, Friday Follow-up Appointments: Wound #1 Right,Medial Metatarsal head first: Return Appointment in 1 week. Home Health: Wound #1 Right,Medial Metatarsal head first: [place] for [place] to change Wednesdays and Fridays [place] Nurse may visit PRN to address patient s wound care needs. FACE TO FACE ENCOUNTER: MEDICARE and MEDICAID PATIENTS: I certify that this patient is under my care and that I had a face-to-face encounter that meets the physician face-to-face encounter requirements with this patient on this date. The encounter with the patient was in whole or in part for the following MEDICAL CONDITION: (primary reason for [place]) MEDICAL NECESSITY: I certify, that based on my findings, NURSING services are a medically necessary home health service. HOME BOUND STATUS: I certify that my clinical findings support that this patient is homebound (i.e., Due to illness or injury, pt requires aid of  supportive devices such as crutches, cane, wheelchairs, walkers, the use of special transportation or the assistance of another person to leave their place of residence. There is a normal inability to leave the home and doing so requires considerable and taxing effort. Other absences are for medical reasons / religious services and are infrequent or of short duration when for other reasons). If current dressing causes regression in wound condition, may D/C ordered dressing product/s and apply Normal Saline Moist Dressing daily until next [place] / Other MD appointment. [place] of regression in wound condition at [phone]. Please direct any NON-WOUND related issues/requests for orders to patient's Primary Care Physician SHERLITA, MENCHACA ([id number]) 1. At this time my suggestion is good to be that we go ahead and initiate treatment with Iodoflex which I think will be the best way to go at this point. I explained to the patient and her son I really not a huge fan of soaking even with the Betadine soaks they have been doing. I think there is a big risk for infection here and again I would prefer they avoid that if at all possible. With that being said I do believe that the Betadine or specifically the Iodoflex will be beneficial as far as helping to clean up the wound. 2. I also explained that the primary concern with whether or not this area is going to heal is going to be blood flow. Obviously she has had some issues here where blood flow does not seem to want to maintain and she has had some problems in that regard. For that reason obviously I think close follow-up with Dr. Lucky Cowboy is good to be definitely recommended.  3. With regard to the drainage and moisture control I do believe that using drawtex behind the Iodoflex followed by wrapping with Kerlix will help to absorb extra fluid. We will also use an ABD pad here. 4. As far as infection is concerned the  patient is currently being managed by Dr. Elvina Mattes in this regard I am not sure when her next appointment will be with him as unfortunately he has been out sick. Either way I do believe that the patient is getting need close follow-up with both Dr. Elvina Mattes as well as here in order to get this area to heal appropriately. I considered a wound VAC as a possibility but I am somewhat concerned about problems with tripping and balance just with having the cord down around her foot. We will see how things go. We will see patient back for reevaluation in 1 week here in the clinic. If anything worsens or changes patient will contact our office for additional recommendations. Electronic Signature(s) Signed: 03/30/2019 5:28:46 PM By: Worthy Keeler PA-C Entered By: Worthy Keeler on 03/30/2019 17:28:46 Sandy Roberson ([id number]) -------------------------------------------------------------------------------- ROS/PFSH Details Patient Name: Sandy Roberson Date of Service: 03/30/2019 1:15 PM Medical Record Number: [id number] Patient Account Number: [id number] Date of Birth/Sex: 08/30/1915 (85 y.o. F) Treating RN: Montey Hora Primary Care Provider: Tracie Harrier Other Clinician: Referring Provider: Referral, Self Treating Provider/Extender: STONE III, Manjot Beumer Weeks in Treatment: 0 Constitutional Symptoms (General Health) Complaints and Symptoms: Negative for: Fatigue; Fever; Chills; Marked Weight Change Eyes Complaints and Symptoms: Negative for: Dry Eyes; Vision Changes; Glasses / Contacts Ear/Nose/Mouth/Throat Complaints and Symptoms: Negative for: Difficult clearing ears; Sinusitis Medical History: Negative for: Chronic sinus problems/congestion; Middle ear problems Past Medical History Notes: dysphagia Hematologic/Lymphatic Complaints and Symptoms: Negative for: Bleeding / Clotting Disorders; Human Immunodeficiency Virus Medical History: Positive for: Anemia Negative for:  Hemophilia; Human Immunodeficiency Virus; Lymphedema; Sickle Cell Disease Respiratory Complaints and Symptoms: Negative for: Chronic or frequent coughs; Shortness of Breath Cardiovascular Complaints and Symptoms: Positive for: LE edema Negative for: Chest pain Medical History: Positive for: Congestive Heart Failure; Coronary Artery Disease; Hypertension; Peripheral Arterial Disease; Peripheral Venous Disease Negative for: Angina; Arrhythmia; Deep Vein Thrombosis; Hypotension; Myocardial Infarction; Phlebitis; Vasculitis Gastrointestinal Complaints and Symptoms: Negative for: Frequent diarrhea; Nausea; Vomiting Biebel, Ardyn ([id number]) Endocrine Complaints and Symptoms: Positive for: Thyroid disease Negative for: Hepatitis; Polydypsia (Excessive Thirst) Medical History: Negative for: Type I Diabetes; Type II Diabetes Genitourinary Complaints and Symptoms: Positive for: Kidney failure/ Dialysis - CKD stage 3; Incontinence/dribbling Medical History: Positive for: End Stage Renal Disease - CKD stage 3 Immunological Complaints and Symptoms: Negative for: Hives; Itching Integumentary (Skin) Complaints and Symptoms: Positive for: Wounds Negative for: Bleeding or bruising tendency; Breakdown; Swelling Medical History: Negative for: History of Burn; History of pressure wounds Musculoskeletal Complaints and Symptoms: Negative for: Muscle Pain; Muscle Weakness Neurologic Complaints and Symptoms: Negative for: Numbness/parasthesias; Focal/Weakness Psychiatric Complaints and Symptoms: Negative for: Anxiety; Claustrophobia Oncologic Immunizations Pneumococcal Vaccine: Received Pneumococcal Vaccination: Yes Implantable Devices None Family and Social History Cancer: No; Diabetes: No; Heart Disease: Yes - Mother,Father; Hereditary Spherocytosis: No; Hypertension: Yes - Mother,Father; Kidney Disease: No; Lung Disease: No; Seizures: No; Stroke: No; Thyroid Problems: No;  Tuberculosis: No; Never smoker; Marital Status - Widowed; Alcohol Use: Never; Drug Use: No History; Caffeine Use: Moderate; Financial Concerns: No; Food, Clothing or Shelter Needs: No; Support System Lacking: No; Transportation Concerns: No MARKELA, ZAHORIK ([id number]) Electronic Signature(s) Signed: 03/30/2019 3:19:47 PM By: Montey Hora Signed: 03/30/2019 5:30:09 PM By: Worthy Keeler  PA-C Entered By: Montey Hora on 03/30/2019 13:28:46 Sandy Roberson ([id number]) -------------------------------------------------------------------------------- SuperBill Details Patient Name: Sandy Roberson Date of Service: 03/30/2019 Medical Record Number: [id number] Patient Account Number: [id number] Date of Birth/Sex: 08/18/1915 (85 y.o. F) Treating RN: Harold Barban Primary Care Provider: Tracie Harrier Other Clinician: Referring Provider: Referral, Self Treating Provider/Extender: Melburn Hake, Kyng Matlock Weeks in Treatment: 0 Diagnosis Coding ICD-10 Codes Code Description I73.89 Other specified peripheral vascular diseases T81.31XA Disruption of external operation (surgical) wound, not elsewhere classified, initial encounter L97.513 Non-pressure chronic ulcer of other part of right foot with necrosis of muscle R13.12 Dysphagia, oropharyngeal phase N18.30 Chronic kidney disease, stage 3 unspecified I50.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 (congestive) heart failure Facility Procedures CPT4 Code Description: [id number] - WOUND CARE VISIT-LEV 3 EST PT Modifier: Quantity: 1 CPT4 Code Description: [id number] 11043 - DEB MUSC/FASCIA 20 SQ CM/< ICD-10 Diagnosis Description L97.513 Non-pressure chronic ulcer of other part of right foot with necr Modifier: osis of muscle Quantity: 1 Physician Procedures CPT4: Description Modifier Quantity Code [id number] - WC PHYS LEVEL 4 - NEW PT 25 1 ICD-10 Diagnosis Description I73.89 Other specified peripheral vascular  diseases T81.31XA Disruption of external operation (surgical) wound, not elsewhere classified,  initial encounter L97.513 Non-pressure chronic ulcer of other part of right foot with necrosis of muscle R13.12 Dysphagia, oropharyngeal phase CPT4: [id number] - WC PHYS DEBR MUSCLE/FASCIA 20 SQ CM 1 ICD-10 Diagnosis Description L97.513 Non-pressure chronic ulcer of other part of right foot with necrosis of muscle Electronic Signature(s) Signed: 03/30/2019 5:29:09 PM By: Worthy Keeler PA-C Previous Signature: 03/30/2019 5:29:00 PM Version By: Worthy Keeler PA-C Entered By: Worthy Keeler on 03/30/2019 17:29:09

## 2019-04-02 ENCOUNTER — Other Ambulatory Visit (INDEPENDENT_AMBULATORY_CARE_PROVIDER_SITE_OTHER): Payer: Self-pay | Admitting: Vascular Surgery

## 2019-04-02 ENCOUNTER — Other Ambulatory Visit (INDEPENDENT_AMBULATORY_CARE_PROVIDER_SITE_OTHER): Payer: Self-pay | Admitting: Family Medicine

## 2019-04-02 DIAGNOSIS — I70239 Atherosclerosis of native arteries of right leg with ulceration of unspecified site: Secondary | ICD-10-CM

## 2019-04-02 DIAGNOSIS — Z9862 Peripheral vascular angioplasty status: Secondary | ICD-10-CM

## 2019-04-06 ENCOUNTER — Encounter: Payer: Medicare Other | Admitting: Physician Assistant

## 2019-04-06 ENCOUNTER — Other Ambulatory Visit: Payer: Self-pay

## 2019-04-06 DIAGNOSIS — T8131XA Disruption of external operation (surgical) wound, not elsewhere classified, initial encounter: Secondary | ICD-10-CM | POA: Diagnosis not present

## 2019-04-06 NOTE — Progress Notes (Signed)
Sandy Roberson ([id number]) Visit Report for 04/06/2019 Arrival Information Details Patient Name: Sandy Roberson Date of Service: 04/06/2019 9:00 AM Medical Record Number: [id number] Patient Account Number: [id number] Date of Birth/Sex: [birth year] (85 y.o. F) Treating RN: Sandy Roberson Primary Care Sandy Roberson: Sandy Roberson Other Clinician: Referring Sandy Roberson: Sandy Roberson Treating Sandy Roberson/Extender: Sandy Roberson, Sandy Roberson Sandy Roberson: 1 Visit Information History Since Last Visit Added or deleted any medications: No Patient Arrived: Wheel Chair Any new allergies or adverse reactions: No Arrival Time: 09:06 Had a fall or experienced change in No Accompanied By: son activities of daily living that may affect Transfer Assistance: Manual risk of falls: Patient Identification Verified: Yes Signs or symptoms of abuse/neglect since last visito No Secondary Verification Process Yes Hospitalized since last visit: No Completed: Implantable device outside of the clinic excluding No Patient Has Alerts: Yes cellular tissue based products placed in the center Patient Alerts: Patient on Blood since last visit: Thinner Has Dressing in Place as Prescribed: Yes aspirin 81 Pain Present Now: No 03/10/19 ABI L .77 R .67 TBI L .24 R .16 Electronic Signature(s) Signed: 04/06/2019 4:13:29 PM By: Sandy Roberson Entered By: Sandy Roberson on 04/06/2019 09:08:20 Sandy Roberson ([id number]) -------------------------------------------------------------------------------- Clinic Level of Care Assessment Details Patient Name: Sandy Roberson Date of Service: 04/06/2019 9:00 AM Medical Record Number: [id number] Patient Account Number: [id number] Date of Birth/Sex: 19-Oct-1915 (85 y.o. F) Treating RN: Harold Barban Primary Care Rangel Echeverri: Sandy Roberson Other Clinician: Referring Umar Patmon: Sandy Roberson Treating Khoury Siemon/Extender: Sandy Roberson, Sandy Roberson Sandy in  Roberson: 1 Clinic Level of Care Assessment Items TOOL 4 Quantity Score []  - Use when only an EandM is performed on FOLLOW-UP visit 0 ASSESSMENTS - Nursing Assessment / Reassessment X - Reassessment of Co-morbidities (includes updates in patient status) 1 10 X- 1 5 Reassessment of Adherence to Roberson Plan ASSESSMENTS - Wound and Skin Assessment / Reassessment X - Simple Wound Assessment / Reassessment - one wound 1 5 []  - 0 Complex Wound Assessment / Reassessment - multiple wounds []  - 0 Dermatologic / Skin Assessment (not related to wound area) ASSESSMENTS - Focused Assessment []  - Circumferential Edema Measurements - multi extremities 0 []  - 0 Nutritional Assessment / Counseling / Intervention []  - 0 Lower Extremity Assessment (monofilament, tuning fork, pulses) []  - 0 Peripheral Arterial Disease Assessment (using hand held doppler) ASSESSMENTS - Ostomy and/or Continence Assessment and Care []  - Incontinence Assessment and Management 0 []  - 0 Ostomy Care Assessment and Management (repouching, etc.) PROCESS - Coordination of Care X - Simple Patient / Family Education for ongoing care 1 15 []  - 0 Complex (extensive) Patient / Family Education for ongoing care []  - 0 Staff obtains Programmer, systems, Records, Test Results / Process Orders X- 1 10 Staff telephones HHA, Nursing Homes / Clarify orders / etc []  - 0 Routine Transfer to another Facility (non-emergent condition) []  - 0 Routine Hospital Admission (non-emergent condition) []  - 0 New Admissions / Biomedical engineer / Ordering NPWT, Apligraf, etc. []  - 0 Emergency Hospital Admission (emergent condition) X- 1 10 Simple Discharge Coordination [name], [name] ([id number]) []  - 0 Complex (extensive) Discharge Coordination PROCESS - Special Needs []  - Pediatric / Minor Patient Management 0 []  - 0 Isolation Patient Management []  - 0 Hearing / Language / Visual special needs []  - 0 Assessment of Community  assistance (transportation, D/C planning, etc.) []  - 0 Additional assistance / Altered mentation []  - 0 Support Surface(s) Assessment (bed, cushion, seat, etc.) INTERVENTIONS - Wound Cleansing / Measurement X - Simple Wound Cleansing -  one wound 1 5 []  - 0 Complex Wound Cleansing - multiple wounds X- 1 5 Wound Imaging (photographs - any number of wounds) []  - 0 Wound Tracing (instead of photographs) X- 1 5 Simple Wound Measurement - one wound []  - 0 Complex Wound Measurement - multiple wounds INTERVENTIONS - Wound Dressings []  - Small Wound Dressing one or multiple wounds 0 X- 1 15 Medium Wound Dressing one or multiple wounds []  - 0 Large Wound Dressing one or multiple wounds X- 1 5 Application of Medications - topical []  - 0 Application of Medications - injection INTERVENTIONS - Miscellaneous []  - External ear exam 0 []  - 0 Specimen Collection (cultures, biopsies, blood, body fluids, etc.) []  - 0 Specimen(s) / Culture(s) sent or taken to Lab for analysis []  - 0 Patient Transfer (multiple staff / Civil Service fast streamer / Similar devices) []  - 0 Simple Staple / Suture removal (25 or less) []  - 0 Complex Staple / Suture removal (26 or more) []  - 0 Hypo / Hyperglycemic Management (close monitor of Blood Glucose) []  - 0 Ankle / Brachial Index (ABI) - do not check if billed separately X- 1 5 Vital Signs [name], [name] ([id number]) Has the patient been seen at the hospital within the last three years: Yes Total Score: 95 Level Of Care: New/Established - Level 3 Electronic Signature(s) Signed: 04/06/2019 3:59:48 PM By: Harold Barban Entered By: Harold Barban on 04/06/2019 09:24:09 Sandy Roberson ([id number]) -------------------------------------------------------------------------------- Encounter Discharge Information Details Patient Name: Sandy Roberson Date of Service: 04/06/2019 9:00 AM Medical Record Number: [id number] Patient Account Number:  [id number] Date of Birth/Sex: 11/12/1915 (85 y.o. F) Treating RN: Harold Barban Primary Care Jadamarie Butson: Sandy Roberson Other Clinician: Referring Kaileah Shevchenko: Sandy Roberson Treating Galdino Hinchman/Extender: Sandy Roberson, Sandy Roberson Sandy Roberson: 1 Encounter Discharge Information Items Discharge Condition: Stable Ambulatory Status: Wheelchair Discharge Destination: Home Transportation: Private Auto Accompanied By: family Schedule Follow-up Appointment: Yes Clinical Summary of Care: Electronic Signature(s) Signed: 04/06/2019 3:59:48 PM By: Harold Barban Entered By: Harold Barban on 04/06/2019 09:28:24 Sandy Roberson ([id number]) -------------------------------------------------------------------------------- Lower Extremity Assessment Details Patient Name: Sandy Roberson Date of Service: 04/06/2019 9:00 AM Medical Record Number: [id number] Patient Account Number: [id number] Date of Birth/Sex: 16-Dec-1915 (85 y.o. F) Treating RN: Sandy Roberson Primary Care Travas Schexnayder: Sandy Roberson Other Clinician: Referring Khalise Billard: Sandy Roberson Treating Laurrie Toppin/Extender: STONE III, Sandy Roberson Sandy Roberson: 1 Edema Assessment Assessed: [Left: No] [Right: No] Edema: [Left: Ye] [Right: s] Vascular Assessment Pulses: Dorsalis Pedis Palpable: [Right:No Yes] Electronic Signature(s) Signed: 04/06/2019 4:13:29 PM By: Sandy Roberson Entered By: Sandy Roberson on 04/06/2019 09:15:19 Sandy Roberson ([id number]) -------------------------------------------------------------------------------- Multi Wound Chart Details Patient Name: Sandy Roberson Date of Service: 04/06/2019 9:00 AM Medical Record Number: [id number] Patient Account Number: [id number] Date of Birth/Sex: [birth year] (85 y.o. F) Treating RN: Harold Barban Primary Care Tyquon Near: Sandy Roberson Other Clinician: Referring Janssen Zee: Sandy Roberson Treating Seith Aikey/Extender: Sandy Roberson, Sandy Roberson Sandy in  Roberson: 1 Vital Signs Height(in): 63 Pulse(bpm): 79 Weight(lbs): 118 Blood Pressure(mmHg): 136/58 Body Mass Index(BMI): 21 Temperature(F): 97.6 Respiratory Rate 16 (breaths/min): Photos: [N/A:N/A] Wound Location: Right Metatarsal head first - N/A N/A Medial Wounding Event: Gradually Appeared N/A N/A Primary Etiology: Arterial Insufficiency Ulcer N/A N/A Comorbid History: Anemia, Congestive Heart N/A N/A Failure, Coronary Artery Disease, Hypertension, Peripheral Arterial Disease, Peripheral Venous Disease, End Stage Renal Disease Date Acquired: 03/16/2018 N/A N/A Sandy of Roberson: 1 N/A N/A Wound Status: Open N/A N/A Pending Amputation on Yes N/A N/A Presentation: Measurements L x W x D 4.2x3.3x2.2 N/A N/A (cm) Area (cm) : 10.886  N/A N/A Volume (cm) : 23.948 N/A N/A % Reduction in Area: -1.90% N/A N/A % Reduction in Volume: -12.10% N/A N/A Classification: Full Thickness With Exposed N/A N/A Support Structures Exudate Amount: Medium N/A N/A Exudate Type: Serous N/A N/A Exudate Color: amber N/A N/A Foul Odor After Cleansing: Yes N/A N/A Odor Anticipated Due to No N/A N/A Product Use: GAYLYNN, CANNONE ([id number]) Wound Margin: Flat and Intact N/A N/A Granulation Amount: Small (1-33%) N/A N/A Granulation Quality: Pink N/A N/A Necrotic Amount: Large (67-100%) N/A N/A Necrotic Tissue: Eschar, Adherent Slough N/A N/A Exposed Structures: Fat Layer (Subcutaneous N/A N/A Tissue) Exposed: Yes Muscle: Yes Bone: Yes Fascia: No Tendon: No Joint: No Epithelialization: None N/A N/A Roberson Notes Electronic Signature(s) Signed: 04/06/2019 3:59:48 PM By: Harold Barban Entered By: Harold Barban on 04/06/2019 09:22:28 Sandy Roberson ([id number]) -------------------------------------------------------------------------------- Multi-Disciplinary Care Plan Details Patient Name: Sandy Roberson Date of Service: 04/06/2019 9:00 AM Medical Record  Number: [id number] Patient Account Number: [id number] Date of Birth/Sex: 07/26/1915 (86 y.o. F) Treating RN: Harold Barban Primary Care Tasmia Blumer: Sandy Roberson Other Clinician: Referring Sheronica Corey: Sandy Roberson Treating Yuleni Burich/Extender: Sandy Roberson, Sandy Roberson Sandy Roberson: 1 Active Inactive Venous Leg Ulcer Nursing Diagnoses: Knowledge deficit related to disease process and management Goals: Patient/caregiver will verbalize understanding of disease process and disease management Date Initiated: 03/30/2019 Target Resolution Date: 03/30/2019 Goal Status: Active Interventions: Assess peripheral edema status every visit. Notes: Wound/Skin Impairment Nursing Diagnoses: Impaired tissue integrity Knowledge deficit related to ulceration/compromised skin integrity Goals: Ulcer/skin breakdown will have a volume reduction of 30% by week 4 Date Initiated: 03/30/2019 Target Resolution Date: 04/30/2019 Goal Status: Active Interventions: Assess patient/caregiver ability to obtain necessary supplies Assess patient/caregiver ability to perform ulcer/skin care regimen upon admission and as needed Assess ulceration(s) every visit Provide education on ulcer and skin care Notes: Electronic Signature(s) Signed: 04/06/2019 3:59:48 PM By: Harold Barban Entered By: Harold Barban on 04/06/2019 09:22:13 Sandy Roberson ([id number]) -------------------------------------------------------------------------------- Pain Assessment Details Patient Name: Sandy Roberson Date of Service: 04/06/2019 9:00 AM Medical Record Number: [id number] Patient Account Number: [id number] Date of Birth/Sex: [birth year] (85 y.o. F) Treating RN: Sandy Roberson Primary Care Frederika Hukill: Sandy Roberson Other Clinician: Referring Lashun Mccants: Sandy Roberson Treating Tadao Emig/Extender: Sandy Roberson, Sandy Roberson Sandy Roberson: 1 Active Problems Location of Pain Severity and Description of Pain Patient Has  Paino No Site Locations Pain Management and Medication Current Pain Management: Electronic Signature(s) Signed: 04/06/2019 4:13:29 PM By: Sandy Roberson Entered By: Sandy Roberson on 04/06/2019 09:09:08 Sandy Roberson ([id number]) -------------------------------------------------------------------------------- Patient/Caregiver Education Details Patient Name: Sandy Roberson Date of Service: 04/06/2019 9:00 AM Medical Record Number: [id number] Patient Account Number: [id number] Date of Birth/Gender: 1916-04-03 (85 y.o. F) Treating RN: Harold Barban Primary Care Physician: Sandy Roberson Other Clinician: Referring Physician: Tracie Roberson Treating Physician/Extender: Sharalyn Ink in Roberson: 1 Education Assessment Education Provided To: Patient Education Topics Provided Wound/Skin Impairment: Handouts: Caring for Your Ulcer Methods: Demonstration, Explain/Verbal Responses: State content correctly Electronic Signature(s) Signed: 04/06/2019 3:59:48 PM By: Harold Barban Entered By: Harold Barban on 04/06/2019 09:22:56 Sandy Roberson ([id number]) -------------------------------------------------------------------------------- Wound Assessment Details Patient Name: Sandy Roberson Date of Service: 04/06/2019 9:00 AM Medical Record Number: [id number] Patient Account Number: [id number] Date of Birth/Sex: 1915/11/30 (85 y.o. F) Treating RN: Sandy Roberson Primary Care Mardell Cragg: Sandy Roberson Other Clinician: Referring Torre Schaumburg: Sandy Roberson Treating Ashantee Deupree/Extender: STONE III, Sandy Roberson Sandy Roberson: 1 Wound Status Wound Number: 1 Primary Arterial Insufficiency Ulcer Etiology: Wound Location: Right Metatarsal head first - Medial Wound Open Wounding Event: Gradually Appeared Status: Date Acquired:  03/16/2018 Comorbid Anemia, Congestive Heart Failure, Coronary Sandy Of Roberson: 1 History: Artery Disease, Hypertension,  Peripheral Arterial Clustered Wound: No Disease, Peripheral Venous Disease, End Stage Pending Amputation On Presentation Renal Disease Photos Wound Measurements Length: (cm) 4.2 Width: (cm) 3.3 Depth: (cm) 2.2 Area: (cm) 10.886 Volume: (cm) 23.948 % Reduction in Area: -1.9% % Reduction in Volume: -12.1% Epithelialization: None Tunneling: No Undermining: No Wound Description Full Thickness With Exposed Support Classification: Structures Wound Margin: Flat and Intact Exudate Medium Amount: Exudate Type: Serous Exudate Color: amber Foul Odor After Cleansing: Yes Due to Product Use: No Slough/Fibrino Yes Wound Bed Granulation Amount: Small (1-33%) Exposed Structure Granulation Quality: Pink Fascia Exposed: No Necrotic Amount: Large (67-100%) Fat Layer (Subcutaneous Tissue) Exposed: Yes Necrotic Quality: Eschar, Adherent Slough Tendon Exposed: No Muscle Exposed: Yes Necrosis of Muscle: Yes Joint Exposed: No Sandy Roberson, Sandy Roberson ([id number]) Bone Exposed: Yes Roberson Notes Wound #1 (Right, Medial Metatarsal head first) Notes Iodoflex, Barrier Cream, Drawtex, ABD, conform to secure Electronic Signature(s) Signed: 04/06/2019 4:13:29 PM By: Sandy Roberson Entered By: Sandy Roberson on 04/06/2019 09:14:57 Sandy Roberson ([id number]) -------------------------------------------------------------------------------- Vitals Details Patient Name: Sandy Roberson Date of Service: 04/06/2019 9:00 AM Medical Record Number: [id number] Patient Account Number: [id number] Date of Birth/Sex: 11-02-1915 (85 y.o. F) Treating RN: Sandy Roberson Primary Care Savahanna Almendariz: Sandy Roberson Other Clinician: Referring Vannah Nadal: Sandy Roberson Treating Massimiliano Rohleder/Extender: Sandy Roberson, Sandy Roberson Sandy Roberson: 1 Vital Signs Time Taken: 09:09 Temperature (F): 97.6 Height (in): 63 Pulse (bpm): 77 Weight (lbs): 118 Respiratory Rate (breaths/min): 16 Body Mass Index (BMI):  20.9 Blood Pressure (mmHg): 136/58 Reference Range: 80 - 120 mg / dl Electronic Signature(s) Signed: 04/06/2019 4:13:29 PM By: Sandy Roberson Entered By: Sandy Roberson on 04/06/2019 09:09:26

## 2019-04-06 NOTE — Progress Notes (Addendum)
NIMRAT, KNOBLAUCH ([id number]) Visit Report for 04/06/2019 Chief Complaint Document Details Patient Name: Sandy Roberson, Sandy Roberson Date of Service: 04/06/2019 9:00 AM Medical Record Number: [id number] Patient Account Number: [id number] Date of Birth/Sex: Nov 27, 1915 (85 y.o. F) Treating RN: Harold Barban Primary Care Provider: Tracie Harrier Other Clinician: Referring Provider: Tracie Harrier Treating Provider/Extender: Melburn Hake, Britiney Blahnik Weeks in Treatment: 1 Information Obtained from: Patient Chief Complaint Right medial foot surgical ulcer Electronic Signature(s) Signed: 04/06/2019 9:19:01 AM By: Worthy Keeler PA-C Entered By: Worthy Keeler on 04/06/2019 09:19:00 Sandy Roberson ([id number]) -------------------------------------------------------------------------------- HPI Details Patient Name: Sandy Roberson Date of Service: 04/06/2019 9:00 AM Medical Record Number: [id number] Patient Account Number: [id number] Date of Birth/Sex: 04-Jan-1916 (85 y.o. F) Treating RN: Harold Barban Primary Care Provider: Tracie Harrier Other Clinician: Referring Provider: Tracie Harrier Treating Provider/Extender: Melburn Hake, Cletus Paris Weeks in Treatment: 1 History of Present Illness HPI Description: 03/30/2019 on evaluation today patient presents for initial evaluation here in our clinic as a referral from Dr. Selina Cooley office who is a local podiatrist. Unfortunately the patient has experienced significant issues with decreased ability to heal in regard to a surgical site and again this is a fairly complicated history. The patient has undergone 3 vascular interventions the most recent of which was actually performed on 12//20. This was subsequent to the patient having ABIs which were somewhat poor registering at 0.67 on the right with a TBI of 0.16 and on the left she was 0.77 with a TBI of 0.24. Subsequently the patient had had surgery to actually remove a portion of the first  metatarsal on her right foot. They did not remove the toe although again from the patient's standpoint I am not 100% sure exactly the reasoning behind what was done or was not done but again I think a big part of the issue was trying to be as minimally invasive for the patient as possible do #1 to her arterial status #2 due to her age. Nonetheless unfortunately she has had some trouble with healing and when the sutures were removed the wound apparently dehisced. The patient does currently have more recent vascular intervention which again was on December 4 with Dr. Lazaro Arms and at this point the patient did have angioplasty which upon completion showed inline flow into the foot with less than 20% residual stenosis after angioplasties. There were multiple sites which had to be worked on during the procedure by Dr. Lucky Cowboy. Again the patient has very problematic blood flow with regard to her arterial status at this point. She does have a history of dysphagia, stage III kidney disease, and congestive heart failure as well. 04/06/2019 on evaluation today patient appears to be doing in my opinion about the same there may be slightly better with regard to the overall. With that being said she still is healing slowly obviously has only been 1 week and now the wounds do appear to be clean enough I still think there is can be quite a bit of work to do going forward before will be where we really need to be here. She does have an appointment with vascular on Wednesday which is just 2 days away for repeat vascular studies along with evaluation with the physician as well. This will be good information for [place] to know going forward as far as her arterial status and what we can or cannot do from the standpoint of debridement or otherwise. Electronic Signature(s) Signed: 04/06/2019 9:42:42 AM By: Worthy Keeler PA-C Entered By: Worthy Keeler on 04/06/2019  09:42:42 Sandy Roberson, Sandy Roberson  ([id number]) -------------------------------------------------------------------------------- Physical Exam Details Patient Name: Sandy Roberson, Sandy Roberson Date of Service: 04/06/2019 9:00 AM Medical Record Number: [id number] Patient Account Number: [id number] Date of Birth/Sex: 10-28-1915 (85 y.o. F) Treating RN: Harold Barban Primary Care Provider: Tracie Harrier Other Clinician: Referring Provider: Tracie Harrier Treating Provider/Extender: STONE III, Sherrita Riederer Weeks in Treatment: 1 Constitutional Well-nourished and well-hydrated in no acute distress. Respiratory normal breathing without difficulty. clear to auscultation bilaterally. Cardiovascular regular rate and rhythm with normal S1, S2. Psychiatric this patient is able to make decisions and demonstrates good insight into disease process. Alert and Oriented x 3. pleasant and cooperative. Notes Upon inspection today patient's wound bed actually showed signs of good granulation at this point unfortunately there does not appear to be any evidence of active infection which is good news. No fevers, chills, nausea, vomiting, or diarrhea. She does have a lot of slough and necrotic tissue on the surface of the wound there is also bone exposed at this point. That was noted last week as well. She does have some lower extremity edema but it is not too terrible. Unfortunately she is not really able to elevate her legs due to pain that occurs when she does likely this is arterial in nature. Electronic Signature(s) Signed: 04/06/2019 9:47:30 AM By: Worthy Keeler PA-C Entered By: Worthy Keeler on 04/06/2019 09:47:30 Sandy Roberson ([id number]) -------------------------------------------------------------------------------- Physician Orders Details Patient Name: Sandy Roberson Date of Service: 04/06/2019 9:00 AM Medical Record Number: [id number] Patient Account Number: [id number] Date of Birth/Sex: Aug 20, 1915 (85 y.o. F) Treating  RN: Harold Barban Primary Care Provider: Tracie Harrier Other Clinician: Referring Provider: Tracie Harrier Treating Provider/Extender: Melburn Hake, Ermel Verne Weeks in Treatment: 1 Verbal / Phone Orders: No Diagnosis Coding ICD-10 Coding Code Description I73.89 Other specified peripheral vascular diseases T81.31XA Disruption of external operation (surgical) wound, not elsewhere classified, initial encounter L97.513 Non-pressure chronic ulcer of other part of right foot with necrosis of muscle R13.12 Dysphagia, oropharyngeal phase N18.30 Chronic kidney disease, stage 3 unspecified I50.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 (congestive) heart failure Wound Cleansing Wound #1 Right,Medial Metatarsal head first o Dial antibacterial soap, wash wounds, rinse and pat dry prior to dressing wounds Skin Barriers/Peri-Wound Care Wound #1 Right,Medial Metatarsal head first o Barrier cream Primary Wound Dressing Wound #1 Right,Medial Metatarsal head first o Iodoflex Secondary Dressing Wound #1 Right,Medial Metatarsal head first o ABD and Kerlix/Conform - Conform o Drawtex - Place Drawtex right behind iodoflex Dressing Change Frequency Wound #1 Right,Medial Metatarsal head first o Change Dressing Monday, Wednesday, Friday Follow-up Appointments Wound #1 Right,Medial Metatarsal head first o Return Appointment in 1 week. Home Health Wound #1 Right,Medial Metatarsal head first o [place] for [place] to change Wednesdays and Fridays o Continue Home Health Visits o Home Health Nurse may visit PRN to address patientos wound care needs. Sandy Roberson, Sandy Roberson ([id number]) o FACE TO FACE ENCOUNTER: MEDICARE and MEDICAID PATIENTS: I certify that this patient is under my care and that I had a face-to-face encounter that meets the physician face-to-face encounter requirements with this patient on this date. The encounter with the  patient was in whole or in part for the following MEDICAL CONDITION: (primary reason for [place]) MEDICAL NECESSITY: I certify, that based on my findings, NURSING services are a medically necessary home health service. HOME BOUND STATUS: I certify that my clinical findings support that this patient is homebound (i.e., Due to illness or injury, pt requires aid of supportive devices such  as crutches, cane, wheelchairs, walkers, the use of special transportation or the assistance of another person to leave their place of residence. There is a normal inability to leave the home and doing so requires considerable and taxing effort. Other absences are for medical reasons / religious services and are infrequent or of short duration when for other reasons). o If current dressing causes regression in wound condition, may D/C ordered dressing product/s and apply Normal Saline Moist Dressing daily until next [place] / Other MD appointment. [place] of regression in wound condition at [phone]. o Please direct any NON-WOUND related issues/requests for orders to patient's Primary Care Physician Electronic Signature(s) Signed: 04/06/2019 3:59:48 PM By: Harold Barban Signed: 04/06/2019 6:06:28 PM By: Worthy Keeler PA-C Entered By: Harold Barban on 04/06/2019 09:27:12 Sandy Roberson, Sandy Roberson ([id number]) -------------------------------------------------------------------------------- Problem List Details Patient Name: Sandy Roberson Date of Service: 04/06/2019 9:00 AM Medical Record Number: [id number] Patient Account Number: [id number] Date of Birth/Sex: 1915/07/15 (85 y.o. F) Treating RN: Harold Barban Primary Care Provider: Tracie Harrier Other Clinician: Referring Provider: Tracie Harrier Treating Provider/Extender: Melburn Hake, Mykah Bellomo Weeks in Treatment: 1 Active Problems ICD-10 Evaluated Encounter Code Description Active Date Today  Diagnosis I73.89 Other specified peripheral vascular diseases 03/30/2019 No Yes T81.31XA Disruption of external operation (surgical) wound, not 03/30/2019 No Yes elsewhere classified, initial encounter L97.513 Non-pressure chronic ulcer of other part of right foot with 03/30/2019 No Yes necrosis of muscle R13.12 Dysphagia, oropharyngeal phase 03/30/2019 No Yes N18.30 Chronic kidney disease, stage 3 unspecified 03/30/2019 No Yes I50.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 0000000 No Yes (congestive) heart failure Inactive Problems Resolved Problems Electronic Signature(s) Signed: 04/06/2019 9:18:46 AM By: Worthy Keeler PA-C Entered By: Worthy Keeler on 04/06/2019 09:18:46 Sandy Roberson ([id number]) -------------------------------------------------------------------------------- Progress Note Details Patient Name: Sandy Roberson Date of Service: 04/06/2019 9:00 AM Medical Record Number: [id number] Patient Account Number: [id number] Date of Birth/Sex: [birth year] (85 y.o. F) Treating RN: Harold Barban Primary Care Provider: Tracie Harrier Other Clinician: Referring Provider: Tracie Harrier Treating Provider/Extender: Melburn Hake, Sariah Henkin Weeks in Treatment: 1 Subjective Chief Complaint Information obtained from Patient Right medial foot surgical ulcer History of Present Illness (HPI) 03/30/2019 on evaluation today patient presents for initial evaluation here in our clinic as a referral from Dr. Selina Cooley office who is a local podiatrist. Unfortunately the patient has experienced significant issues with decreased ability to heal in regard to a surgical site and again this is a fairly complicated history. The patient has undergone 3 vascular interventions the most recent of which was actually performed on 12//20. This was subsequent to the patient having ABIs which were somewhat poor registering at 0.67 on the right with a TBI of 0.16 and on the left  she was 0.77 with a TBI of 0.24. Subsequently the patient had had surgery to actually remove a portion of the first metatarsal on her right foot. They did not remove the toe although again from the patient's standpoint I am not 100% sure exactly the reasoning behind what was done or was not done but again I think a big part of the issue was trying to be as minimally invasive for the patient as possible do #1 to her arterial status #2 due to her age. Nonetheless unfortunately she has had some trouble with healing and when the sutures were removed the wound apparently dehisced. The patient does currently have more recent vascular intervention which again was on December 4 with Dr. Lazaro Arms and at this point the  patient did have angioplasty which upon completion showed inline flow into the foot with less than 20% residual stenosis after angioplasties. There were multiple sites which had to be worked on during the procedure by Dr. Lucky Cowboy. Again the patient has very problematic blood flow with regard to her arterial status at this point. She does have a history of dysphagia, stage III kidney disease, and congestive heart failure as well. 04/06/2019 on evaluation today patient appears to be doing in my opinion about the same there may be slightly better with regard to the overall. With that being said she still is healing slowly obviously has only been 1 week and now the wounds do appear to be clean enough I still think there is can be quite a bit of work to do going forward before will be where we really need to be here. She does have an appointment with vascular on Wednesday which is just 2 days away for repeat vascular studies along with evaluation with the physician as well. This will be good information for [place] to know going forward as far as her arterial status and what we can or cannot do from the standpoint of debridement or otherwise. Patient History Information obtained from Patient. Family  History Heart Disease - Mother,Father, Hypertension - Mother,Father, No family history of Cancer, Diabetes, Hereditary Spherocytosis, Kidney Disease, Lung Disease, Seizures, Stroke, Thyroid Problems, Tuberculosis. Social History Never smoker, Marital Status - Widowed, Alcohol Use - Never, Drug Use - No History, Caffeine Use - Moderate. Medical History Ear/Nose/Mouth/Throat Denies history of Chronic sinus problems/congestion, Middle ear problems Hematologic/Lymphatic Patient has history of Anemia Denies history of Hemophilia, Human Immunodeficiency Virus, Lymphedema, Sickle Cell Disease Cardiovascular Sandy Roberson, Sandy Roberson ([id number]) Patient has history of Congestive Heart Failure, Coronary Artery Disease, Hypertension, Peripheral Arterial Disease, Peripheral Venous Disease Denies history of Angina, Arrhythmia, Deep Vein Thrombosis, Hypotension, Myocardial Infarction, Phlebitis, Vasculitis Endocrine Denies history of Type I Diabetes, Type II Diabetes Genitourinary Patient has history of End Stage Renal Disease - CKD stage 3 Integumentary (Skin) Denies history of History of Burn, History of pressure wounds Medical And Surgical History Notes Ear/Nose/Mouth/Throat dysphagia Review of Systems (ROS) Constitutional Symptoms (General Health) Denies complaints or symptoms of Fatigue, Fever, Chills, Marked Weight Change. Respiratory Denies complaints or symptoms of Chronic or frequent coughs, Shortness of Breath. Cardiovascular Complains or has symptoms of LE edema. Denies complaints or symptoms of Chest pain. Psychiatric Denies complaints or symptoms of Anxiety, Claustrophobia. Objective Constitutional Well-nourished and well-hydrated in no acute distress. Vitals Time Taken: 9:09 AM, Height: 63 in, Weight: 118 lbs, BMI: 20.9, Temperature: 97.6 F, Pulse: 77 bpm, Respiratory Rate: 16 breaths/min, Blood Pressure: 136/58 mmHg. Respiratory normal breathing without difficulty. clear to  auscultation bilaterally. Cardiovascular regular rate and rhythm with normal S1, S2. Psychiatric this patient is able to make decisions and demonstrates good insight into disease process. Alert and Oriented x 3. pleasant and cooperative. General Notes: Upon inspection today patient's wound bed actually showed signs of good granulation at this point unfortunately there does not appear to be any evidence of active infection which is good news. No fevers, chills, nausea, vomiting, or diarrhea. She does have a lot of slough and necrotic tissue on the surface of the wound there is also bone exposed at this point. That was noted last week as well. She does have some lower extremity edema but it is not too terrible. Unfortunately she is not really able to elevate her legs due to pain that occurs when she does likely  this is arterial in nature. Sandy Roberson, Sandy Roberson ([id number]) Integumentary (Hair, Skin) Wound #1 status is Open. Original cause of wound was Gradually Appeared. The wound is located on the Right,Medial Metatarsal head first. The wound measures 4.2cm length x 3.3cm width x 2.2cm depth; 10.886cm^2 area and 23.948cm^3 volume. There is bone, muscle, and Fat Layer (Subcutaneous Tissue) Exposed exposed. There is no tunneling or undermining noted. There is a medium amount of serous drainage noted. Foul odor after cleansing was noted. The wound margin is flat and intact. There is small (1-33%) pink granulation within the wound bed. There is a large (67-100%) amount of necrotic tissue within the wound bed including Eschar, Adherent Slough and Necrosis of Muscle. Assessment Active Problems ICD-10 Other specified peripheral vascular diseases Disruption of external operation (surgical) wound, not elsewhere classified, initial encounter Non-pressure chronic ulcer of other part of right foot with necrosis of muscle Dysphagia, oropharyngeal phase Chronic kidney disease, stage 3 unspecified Chronic  combined systolic (congestive) and diastolic (congestive) heart failure Plan Wound Cleansing: Wound #1 Right,Medial Metatarsal head first: Dial antibacterial soap, wash wounds, rinse and pat dry prior to dressing wounds Skin Barriers/Peri-Wound Care: Wound #1 Right,Medial Metatarsal head first: Barrier cream Primary Wound Dressing: Wound #1 Right,Medial Metatarsal head first: Iodoflex Secondary Dressing: Wound #1 Right,Medial Metatarsal head first: ABD and Kerlix/Conform - Conform Drawtex - Place Drawtex right behind iodoflex Dressing Change Frequency: Wound #1 Right,Medial Metatarsal head first: Change Dressing Monday, Wednesday, Friday Follow-up Appointments: Wound #1 Right,Medial Metatarsal head first: Return Appointment in 1 week. Home Health: Wound #1 Right,Medial Metatarsal head first: [place] for [place] to change Wednesdays and Fridays [place] Nurse may visit PRN to address patient s wound care needs. FACE TO FACE ENCOUNTER: MEDICARE and MEDICAID PATIENTS: I certify that this patient is under my care and that I had a face-to-face encounter that meets the physician face-to-face encounter requirements with this patient on this date. The encounter with the patient was in whole or in part for the following MEDICAL CONDITION: (primary reason for [place]) MEDICAL NECESSITY: I certify, that based on my findings, NURSING services are a medically necessary home Sandy Roberson, Sandy Roberson ([id number]) health service. HOME BOUND STATUS: I certify that my clinical findings support that this patient is homebound (i.e., Due to illness or injury, pt requires aid of supportive devices such as crutches, cane, wheelchairs, walkers, the use of special transportation or the assistance of another person to leave their place of residence. There is a normal inability to leave the home and doing so requires considerable and taxing  effort. Other absences are for medical reasons / religious services and are infrequent or of short duration when for other reasons). If current dressing causes regression in wound condition, may D/C ordered dressing product/s and apply Normal Saline Moist Dressing daily until next [place] / Other MD appointment. [place] of regression in wound condition at [phone]. Please direct any NON-WOUND related issues/requests for orders to patient's Primary Care Physician 1. My suggestion at this time based on what I am seeing is that we go ahead and continue with the Iodoflex since that seems to be doing beneficial for her. Overall I am very pleased with how things stand and I see no signs of infection. 2. I would recommend as well that we continue with the ABD pad and con form to try to help with moisture control as well as the drawtex we will add barrier  cream around the periwound. 3. With regard to arterial status the patient will be seeing her vascular surgeon soon. In fact in 2 days. At that point we will see what they have to say about where things stand from her blood flow perspective which will shed light on what we can expect from a wound healing perspective. We will see patient back for reevaluation in 1 week here in the clinic. If anything worsens or changes patient will contact our office for additional recommendations. Electronic Signature(s) Signed: 04/06/2019 9:58:51 AM By: Worthy Keeler PA-C Entered By: Worthy Keeler on 04/06/2019 09:58:51 Sandy Roberson ([id number]) -------------------------------------------------------------------------------- ROS/PFSH Details Patient Name: Sandy Roberson Date of Service: 04/06/2019 9:00 AM Medical Record Number: [id number] Patient Account Number: [id number] Date of Birth/Sex: 04-Mar-1916 (85 y.o. F) Treating RN: Harold Barban Primary Care Provider: Tracie Harrier Other Clinician: Referring  Provider: Tracie Harrier Treating Provider/Extender: Melburn Hake, Jayven Naill Weeks in Treatment: 1 Information Obtained From Patient Constitutional Symptoms (General Health) Complaints and Symptoms: Negative for: Fatigue; Fever; Chills; Marked Weight Change Respiratory Complaints and Symptoms: Negative for: Chronic or frequent coughs; Shortness of Breath Cardiovascular Complaints and Symptoms: Positive for: LE edema Negative for: Chest pain Medical History: Positive for: Congestive Heart Failure; Coronary Artery Disease; Hypertension; Peripheral Arterial Disease; Peripheral Venous Disease Negative for: Angina; Arrhythmia; Deep Vein Thrombosis; Hypotension; Myocardial Infarction; Phlebitis; Vasculitis Psychiatric Complaints and Symptoms: Negative for: Anxiety; Claustrophobia Ear/Nose/Mouth/Throat Medical History: Negative for: Chronic sinus problems/congestion; Middle ear problems Past Medical History Notes: dysphagia Hematologic/Lymphatic Medical History: Positive for: Anemia Negative for: Hemophilia; Human Immunodeficiency Virus; Lymphedema; Sickle Cell Disease Endocrine Medical History: Negative for: Type I Diabetes; Type II Diabetes Genitourinary Sandy Roberson, Sandy Roberson ([id number]) Medical History: Positive for: End Stage Renal Disease - CKD stage 3 Integumentary (Skin) Medical History: Negative for: History of Burn; History of pressure wounds Immunizations Pneumococcal Vaccine: Received Pneumococcal Vaccination: Yes Implantable Devices None Family and Social History Cancer: No; Diabetes: No; Heart Disease: Yes - Mother,Father; Hereditary Spherocytosis: No; Hypertension: Yes - Mother,Father; Kidney Disease: No; Lung Disease: No; Seizures: No; Stroke: No; Thyroid Problems: No; Tuberculosis: No; Never smoker; Marital Status - Widowed; Alcohol Use: Never; Drug Use: No History; Caffeine Use: Moderate; Financial Concerns: No; Food, Clothing or Shelter Needs: No; Support System  Lacking: No; Transportation Concerns: No Physician Affirmation I have reviewed and agree with the above information. Electronic Signature(s) Signed: 04/06/2019 3:59:48 PM By: Harold Barban Signed: 04/06/2019 6:06:28 PM By: Worthy Keeler PA-C Entered By: Worthy Keeler on 04/06/2019 09:43:03 Sandy Roberson ([id number]) -------------------------------------------------------------------------------- SuperBill Details Patient Name: Sandy Roberson Date of Service: 04/06/2019 Medical Record Number: [id number] Patient Account Number: [id number] Date of Birth/Sex: July 03, 1915 (85 y.o. 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 muscle R13.12 Dysphagia, oropharyngeal phase N18.30 Chronic kidney disease, stage 3 unspecified I50.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 (congestive) heart failure Facility Procedures CPT4 Code: [id number] Description: 99213 - WOUND CARE VISIT-LEV 3 EST PT Modifier: Quantity: 1 Physician Procedures CPT4: Description Modifier Quantity Code [id number] - WC PHYS LEVEL 4 - EST PT 1 ICD-10 Diagnosis Description I73.89 Other specified peripheral vascular diseases T81.31XA Disruption of external operation (surgical) wound, not elsewhere classified,  initial encounter L97.513 Non-pressure chronic  ulcer of other part of right foot with necrosis of muscle R13.12 Dysphagia, oropharyngeal phase Electronic Signature(s) Signed: 04/06/2019 9:59:07 AM By: Worthy Keeler PA-C Entered By: Worthy Keeler on 04/06/2019  09:59:06

## 2019-04-08 ENCOUNTER — Other Ambulatory Visit: Payer: Self-pay

## 2019-04-08 ENCOUNTER — Ambulatory Visit (INDEPENDENT_AMBULATORY_CARE_PROVIDER_SITE_OTHER): Payer: Medicare Other | Admitting: Nurse Practitioner

## 2019-04-08 ENCOUNTER — Encounter (INDEPENDENT_AMBULATORY_CARE_PROVIDER_SITE_OTHER): Payer: Self-pay | Admitting: Nurse Practitioner

## 2019-04-08 ENCOUNTER — Ambulatory Visit (INDEPENDENT_AMBULATORY_CARE_PROVIDER_SITE_OTHER): Payer: Medicare Other

## 2019-04-08 VITALS — BP 158/73 | HR 75 | Resp 15

## 2019-04-08 DIAGNOSIS — Z9862 Peripheral vascular angioplasty status: Secondary | ICD-10-CM

## 2019-04-08 DIAGNOSIS — I70239 Atherosclerosis of native arteries of right leg with ulceration of unspecified site: Secondary | ICD-10-CM | POA: Diagnosis not present

## 2019-04-08 DIAGNOSIS — I10 Essential (primary) hypertension: Secondary | ICD-10-CM | POA: Diagnosis not present

## 2019-04-08 DIAGNOSIS — E785 Hyperlipidemia, unspecified: Secondary | ICD-10-CM

## 2019-04-08 DIAGNOSIS — I7025 Atherosclerosis of native arteries of other extremities with ulceration: Secondary | ICD-10-CM

## 2019-04-13 ENCOUNTER — Encounter: Payer: Medicare Other | Admitting: Internal Medicine

## 2019-04-13 ENCOUNTER — Encounter (INDEPENDENT_AMBULATORY_CARE_PROVIDER_SITE_OTHER): Payer: Self-pay | Admitting: Nurse Practitioner

## 2019-04-13 ENCOUNTER — Telehealth (INDEPENDENT_AMBULATORY_CARE_PROVIDER_SITE_OTHER): Payer: Self-pay

## 2019-04-13 ENCOUNTER — Other Ambulatory Visit: Payer: Self-pay

## 2019-04-13 DIAGNOSIS — T8131XA Disruption of external operation (surgical) wound, not elsewhere classified, initial encounter: Secondary | ICD-10-CM | POA: Diagnosis not present

## 2019-04-13 NOTE — Progress Notes (Signed)
SUBJECTIVE:  Patient ID: Sandy Roberson, female    DOB: [birth year], 85 y.o.   MRN: [id number] Chief Complaint  Patient presents with  . Follow-up    [facility] 3week abi    HPI  Sandy Roberson is a 85 y.o. female that presents today after right lower extremity angiogram on 03/20/2019.  The patient previously had a right transmetatarsal amputation on 04/27/2018 which has had some delayed healing.  Today, the patient's son does report that the wound healing is doing well at this time.  The patient's son also does report that she has healed well following her procedure.  The patient is very hard of hearing so she does not participate much in the conversation.  She denies any fever, chills, nausea, vomiting or diarrhea.  The right ABI is 1.05 with the left is 0.59.  Previously on 03/10/2019 the right ABI was 0.67 with a left of 0.77.  The left ABI 0.59 whereas previously it was 0.77.  The right lower extremity has monophasic waveforms in the right posterior tibial artery with strong biphasic waveforms in the peroneal artery as well as strong toe waveforms.  The left lower extremity has strong monophasic waveforms within the peroneal artery however the posterior and anterior tibial arteries are occluded.  The left great toe waveform is nearly flat.  Past Medical History:  Diagnosis Date  . Coronary artery disease   . Hyperlipidemia   . Hypertension   . Renal disorder     Past Surgical History:  Procedure Laterality Date  . CORONARY STENT PLACEMENT    . ESOPHAGOGASTRODUODENOSCOPY (EGD) WITH PROPOFOL N/A 10/20/2018   Procedure: ESOPHAGOGASTRODUODENOSCOPY (EGD) WITH PROPOFOL;  Surgeon: Lucilla Lame, MD;  Location: [facility] ENDOSCOPY;  Service: Endoscopy;  Laterality: N/A;  . FRACTURE SURGERY    . LOWER EXTREMITY ANGIOGRAPHY Right 11/24/2018   Procedure: LOWER EXTREMITY ANGIOGRAPHY;  Surgeon: Algernon Huxley, MD;  Location: [place] [facility];  Service: Cardiovascular;  Laterality: Right;  . LOWER  EXTREMITY ANGIOGRAPHY Right 01/05/2019   Procedure: LOWER EXTREMITY ANGIOGRAPHY;  Surgeon: Algernon Huxley, MD;  Location: [place] [facility];  Service: Cardiovascular;  Laterality: Right;  . LOWER EXTREMITY ANGIOGRAPHY Left 03/20/2019   Procedure: LOWER EXTREMITY ANGIOGRAPHY;  Surgeon: Algernon Huxley, MD;  Location: [place] [facility];  Service: Cardiovascular;  Laterality: Left;  . STENT PLACEMENT RT URETER [facility] HX)     since been removed   . 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 Marital status: Widowed    Spouse name: Not on file  . Number of children: Not on file  . Years of education: Not on file  . Highest education level: Not on file  Occupational History  . Not on file  Tobacco Use  . Smoking status: Never Smoker  . Smokeless tobacco: Never Used  Substance and Sexual Activity  . Alcohol use: No  . Drug use: Never  . Sexual activity: Not Currently  Other Topics Concern  . Not on file  Social History Narrative  . Not on file   Social Determinants of Health   Financial Resource Strain:   . Difficulty of Paying Living Expenses: Not on file  Food Insecurity:   . Worried About Charity fundraiser in the Last Year: Not on file  . Ran Out of Food in the Last  Year: Not on file  Transportation Needs:   . Lack of Transportation (Medical): Not on file  . Lack of Transportation (Non-Medical): Not on file  Physical Activity:   . Days of Exercise per Week: Not on file  . Minutes of Exercise per Session: Not on file  Stress:   . Feeling of Stress : Not on file  Social Connections:   . Frequency of Communication with Friends and Family: Not on file  . Frequency of Social Gatherings with Friends and Family: Not on file  . Attends Religious Services: Not on file  . Active Member of Clubs or  Organizations: Not on file  . Attends Archivist Meetings: Not on file  . Marital Status: Not on file  Intimate Partner Violence:   . Fear of Current or Ex-Partner: Not on file  . Emotionally Abused: Not on file  . Physically Abused: Not on file  . Sexually Abused: Not on file    Family History  Problem Relation Age of Onset  . Heart failure Neg Hx     Allergies  Allergen Reactions  . Penicillins Rash    Did it involve swelling of the face/tongue/throat, SOB, or low BP? No Did it involve sudden or severe rash/hives, skin peeling, or any reaction on the inside of your mouth or nose? No Did you need to seek medical attention at a hospital or doctor's office? No When did it last happen?10 Years If all above answers are "NO", may proceed with cephalosporin use.     Review of Systems   Review of Systems: Negative Unless Checked Constitutional: [] Weight loss  [] Fever  [] Chills Cardiac: [] Chest pain   []  Atrial Fibrillation  [] Palpitations   [] Shortness of breath when laying flat   [x] Shortness of breath with exertion. [] Shortness of breath at rest Vascular:  [] Pain in legs with walking   [] Pain in legs with standing [] Pain in legs when laying flat   [] Claudication    [] Pain in feet when laying flat    [] History of DVT   [] Phlebitis   [] Swelling in legs   [] Varicose veins   [] Non-healing ulcers Pulmonary:   [] Uses home oxygen   [] Productive cough   [] Hemoptysis   [] Wheeze  [] COPD   [] Asthma Neurologic:  [] Dizziness   [] Seizures  [] Blackouts [] History of stroke   [] History of TIA  [] Aphasia   [] Temporary Blindness   [] Weakness or numbness in arm   [] Weakness or numbness in leg Musculoskeletal:   [] Joint swelling   [] Joint pain   [] Low back pain  []  History of Knee Replacement [] Arthritis [] back Surgeries  []  Spinal Stenosis    Hematologic:  [] Easy bruising  [] Easy bleeding   [] Hypercoagulable state   [x] Anemic Gastrointestinal:  [] Diarrhea   [] Vomiting  [] Gastroesophageal  reflux/heartburn   [] Difficulty swallowing. [] Abdominal pain Genitourinary:  [x] Chronic kidney disease   [] Difficult urination  [] Anuric   [] Blood in urine [] Frequent urination  [] Burning with urination   [] Hematuria Skin:  [] Rashes   [] Ulcers [x] Wounds Psychological:  [] History of anxiety   []  History of major depression  []  Memory Difficulties      OBJECTIVE:   Physical Exam  BP (!) 158/73 (BP Location: Right Arm)   Pulse 75   Resp 15   Gen: WD/WN, NAD Head: [place]/AT, No temporalis wasting.  Ear/Nose/Throat: Hearing grossly intact, nares w/o erythema or drainage Eyes: PER, EOMI, sclera nonicteric.  Neck: Supple, no masses.  No JVD.  Pulmonary:  Good air movement, no use of accessory  muscles.  Cardiac: RRR Vascular: non-palpable pulses on left, unable to palpate right due to wound. Vessel Right Left  Radial Palpable Palpable   Gastrointestinal: soft, non-distended. No guarding/no peritoneal signs.  Musculoskeletal:Using wheelchair.   No deformity or atrophy.  Neurologic: Pain and light touch intact in extremities.  Symmetrical.  Speech is fluent. Motor exam as listed above. Psychiatric: Judgment intact, Mood & affect appropriate for pt's clinical situation. Dermatologic: No Venous rashes. No Ulcers Noted.  No changes consistent with cellulitis. Lymph : No Cervical lymphadenopathy, no lichenification or skin changes of chronic lymphedema.       ASSESSMENT AND PLAN:  1. Atherosclerosis of native arteries of the extremities with ulceration ([place])  Recommend:  The patient has evidence of severe atherosclerotic changes of the left lower extremities.This represents a limb threatening ischemia and places the patient at the risk for limb loss.  After discussion with the patient's son, he would prefer intervention on the left lower extremity prior to having any issues such as ulceration.  Patient should undergo angiography of the lower extremities with the hope for intervention for  limb salvage.  The risks and benefits as well as the alternative therapies was discussed in detail with the patient.  All questions were answered.  Patient agrees to proceed with angiography.  The patient will follow up with me in the office after the procedure.    2. Essential hypertension Continue antihypertensive medications as already ordered, these medications have been reviewed and there are no changes at this time.   3. Hyperlipidemia, unspecified hyperlipidemia type Continue statin as ordered and reviewed, no changes at this time    Current Outpatient Medications on File Prior to Visit  Medication Sig Dispense Refill  . aspirin (ASPIRIN 81) 81 MG chewable tablet Chew 1 tablet (81 mg total) by mouth daily. 30 tablet 0  . atorvastatin (LIPITOR) 10 MG tablet Take 1 tablet (10 mg total) by mouth daily. 30 tablet 11  . calcitRIOL (ROCALTROL) 0.25 MCG capsule Take by mouth daily.     . carvedilol (COREG) 3.125 MG tablet Take 1 tablet (3.125 mg total) by mouth 2 (two) times daily with a meal. 60 tablet 1  . Cholecalciferol (VITAMIN D3) 50 MCG (2000 UT) TABS Take 2,000 Units by mouth daily.     . clopidogrel (PLAVIX) 75 MG tablet Take 1 tablet (75 mg total) by mouth daily. 30 tablet 11  . doxycycline (VIBRA-TABS) 100 MG tablet Take 100 mg by mouth 2 (two) times daily.    . methimazole (TAPAZOLE) 5 MG tablet Take 5 mg by mouth daily.     Marland Kitchen trimethoprim (TRIMPEX) 100 MG tablet Take 1 tablet (100 mg total) by mouth daily. 14 tablet 0   No current facility-administered medications on file prior to visit.    There are no Patient Instructions on file for this visit. No follow-ups on file.   Kris Hartmann, NP  This note was completed with Sales executive.  Any errors are purely unintentional.

## 2019-04-13 NOTE — Progress Notes (Signed)
Sandy Roberson ([id number]) Visit Report for 04/13/2019 Arrival Information Details Patient Name: Sandy Roberson Date of Service: 04/13/2019 11:00 AM Medical Record Number: [id number] Patient Account Number: [id number] Date of Birth/Sex: 09/26/1915 (85 y.o. F) Treating RN: Montey Hora Primary Care Ranen Doolin: Tracie Harrier Other Clinician: Referring Jeremy Ditullio: Tracie Harrier Treating Elfriede Bonini/Extender: Beverly Gust in Treatment: 2 Visit Information History Since Last Visit Added or deleted any medications: No Patient Arrived: Wheel Chair Any new allergies or adverse reactions: No Arrival Time: 10:56 Had a fall or experienced change in No Accompanied By: son activities of daily living that may affect Transfer Assistance: Manual risk of falls: Patient Identification Verified: Yes Signs or symptoms of abuse/neglect since last visito No Secondary Verification Process Yes Hospitalized since last visit: No Completed: Implantable device outside of the clinic excluding No Patient Has Alerts: Yes cellular tissue based products placed in the center Patient Alerts: Patient on Blood since last visit: Thinner Has Dressing in Place as Prescribed: Yes aspirin 81 Pain Present Now: No 03/10/19 ABI L .77 R .67 TBI L .24 R .16 Electronic Signature(s) Signed: 04/13/2019 4:30:26 PM By: Montey Hora Entered By: Montey Hora on 04/13/2019 11:02:20 Sandy Roberson ([id number]) -------------------------------------------------------------------------------- Encounter Discharge Information Details Patient Name: Sandy Roberson Date of Service: 04/13/2019 11:00 AM Medical Record Number: [id number] Patient Account Number: [id number] Date of Birth/Sex: November 14, 1915 (85 y.o. F) Treating RN: Harold Barban Primary Care Miranda Frese: Tracie Harrier Other Clinician: Referring Jaala Bohle: Tracie Harrier Treating Waverly Chavarria/Extender: Beverly Gust in  Treatment: 2 Encounter Discharge Information Items Post Procedure Vitals Discharge Condition: Stable Temperature (F): 97.8 Ambulatory Status: Wheelchair Pulse (bpm): 75 Discharge Destination: Home Respiratory Rate (breaths/min): 16 Transportation: Private Auto Blood Pressure (mmHg): 134/69 Accompanied By: family Schedule Follow-up Appointment: Yes Clinical Summary of Care: Electronic Signature(s) Signed: 04/13/2019 4:23:20 PM By: Harold Barban Entered By: Harold Barban on 04/13/2019 11:20:12 Sandy Roberson ([id number]) -------------------------------------------------------------------------------- Lower Extremity Assessment Details Patient Name: Sandy Roberson Date of Service: 04/13/2019 11:00 AM Medical Record Number: [id number] Patient Account Number: [id number] Date of Birth/Sex: 1915-10-15 (85 y.o. F) Treating RN: Montey Hora Primary Care Dainelle Hun: Tracie Harrier Other Clinician: Referring Jamin Panther: Tracie Harrier Treating Vaniah Chambers/Extender: Beverly Gust in Treatment: 2 Edema Assessment Assessed: [Left: No] [Right: No] Edema: [Left: Ye] [Right: s] Vascular Assessment Pulses: Dorsalis Pedis Palpable: [Right:Yes] Electronic Signature(s) Signed: 04/13/2019 4:30:26 PM By: Montey Hora Entered By: Montey Hora on 04/13/2019 11:05:30 Sandy Roberson ([id number]) -------------------------------------------------------------------------------- Multi Wound Chart Details Patient Name: Sandy Roberson Date of Service: 04/13/2019 11:00 AM Medical Record Number: [id number] Patient Account Number: [id number] Date of Birth/Sex: November 10, 1915 (85 y.o. F) Treating RN: Harold Barban Primary Care Oley Lahaie: Tracie Harrier Other Clinician: Referring Chrissie Dacquisto: Tracie Harrier Treating Margarette Vannatter/Extender: Beverly Gust in Treatment: 2 Vital Signs Height(in): 63 Pulse(bpm): 75 Weight(lbs): 118 Blood Pressure(mmHg): 134/69 Body  Mass Index(BMI): 21 Temperature(F): 97.8 Respiratory Rate 16 (breaths/min): Photos: [N/A:N/A] Wound Location: Right Metatarsal head first - N/A N/A Medial Wounding Event: Gradually Appeared N/A N/A Primary Etiology: Arterial Insufficiency Ulcer N/A N/A Comorbid History: Anemia, Congestive Heart N/A N/A Failure, Coronary Artery Disease, Hypertension, Peripheral Arterial Disease, Peripheral Venous Disease, End Stage Renal Disease Date Acquired: 03/16/2018 N/A N/A Weeks of Treatment: 2 N/A N/A Wound Status: Open N/A N/A Pending Amputation on Yes N/A N/A Presentation: Measurements L x W x D 4.1x3x1.9 N/A N/A (cm) Area (cm) : 9.66 N/A N/A Volume (cm) : 18.355 N/A N/A % Reduction in Area: 9.60% N/A N/A % Reduction in Volume: 14.10% N/A N/A Classification: Full Thickness With Exposed N/A N/A Support  Structures Exudate Amount: Medium N/A N/A Exudate Type: Serous N/A N/A Exudate Color: amber N/A N/A Foul Odor After Cleansing: Yes N/A N/A Odor Anticipated Due to No N/A N/A Product Use: KENDI, HASBUN ([id number]) Wound Margin: Flat and Intact N/A N/A Granulation Amount: Small (1-33%) N/A N/A Granulation Quality: Pink N/A N/A Necrotic Amount: Large (67-100%) N/A N/A Necrotic Tissue: Eschar, Adherent Slough N/A N/A Exposed Structures: Fat Layer (Subcutaneous N/A N/A Tissue) Exposed: Yes Muscle: Yes Bone: Yes Fascia: No Tendon: No Joint: No Epithelialization: None N/A N/A Treatment Notes Electronic Signature(s) Signed: 04/13/2019 4:23:20 PM By: Harold Barban Entered By: Harold Barban on 04/13/2019 11:13:05 Sandy Roberson ([id number]) -------------------------------------------------------------------------------- Multi-Disciplinary Care Plan Details Patient Name: Sandy Roberson Date of Service: 04/13/2019 11:00 AM Medical Record Number: [id number] Patient Account Number: [id number] Date of Birth/Sex: 1915/06/05 (85 y.o. F) Treating RN: Harold Barban Primary Care Shain Pauwels: Tracie Harrier Other Clinician: Referring Lesly Joslyn: Tracie Harrier Treating Yaritzi Craun/Extender: Beverly Gust in Treatment: 2 Active Inactive Venous Leg Ulcer Nursing Diagnoses: Knowledge deficit related to disease process and management Goals: Patient/caregiver will verbalize understanding of disease process and disease management Date Initiated: 03/30/2019 Target Resolution Date: 03/30/2019 Goal Status: Active Interventions: Assess peripheral edema status every visit. Notes: Wound/Skin Impairment Nursing Diagnoses: Impaired tissue integrity Knowledge deficit related to ulceration/compromised skin integrity Goals: Ulcer/skin breakdown will have a volume reduction of 30% by week 4 Date Initiated: 03/30/2019 Target Resolution Date: 04/30/2019 Goal Status: Active Interventions: Assess patient/caregiver ability to obtain necessary supplies Assess patient/caregiver ability to perform ulcer/skin care regimen upon admission and as needed Assess ulceration(s) every visit Provide education on ulcer and skin care Notes: Electronic Signature(s) Signed: 04/13/2019 4:23:20 PM By: Harold Barban Entered By: Harold Barban on 04/13/2019 11:12:36 Sandy Roberson ([id number]) -------------------------------------------------------------------------------- Pain Assessment Details Patient Name: Sandy Roberson Date of Service: 04/13/2019 11:00 AM Medical Record Number: [id number] Patient Account Number: [id number] Date of Birth/Sex: 1915-12-19 (85 y.o. F) Treating RN: Montey Hora Primary Care Sequoyah Ramone: Tracie Harrier Other Clinician: Referring Trystyn Sitts: Tracie Harrier Treating Costantino Kohlbeck/Extender: Beverly Gust in Treatment: 2 Active Problems Location of Pain Severity and Description of Pain Patient Has Paino No Site Locations Pain Management and Medication Current Pain Management: Electronic Signature(s) Signed:  04/13/2019 4:30:26 PM By: Montey Hora Entered By: Montey Hora on 04/13/2019 11:02:26 Sandy Roberson ([id number]) -------------------------------------------------------------------------------- Patient/Caregiver Education Details Patient Name: Sandy Roberson Date of Service: 04/13/2019 11:00 AM Medical Record Number: [id number] Patient Account Number: [id number] Date of Birth/Gender: December 25, 1915 (85 y.o. F) Treating RN: Harold Barban Primary Care Physician: Tracie Harrier Other Clinician: Referring Physician: Tracie Harrier Treating Physician/Extender: Beverly Gust in Treatment: 2 Education Assessment Education Provided To: Patient Education Topics Provided Wound/Skin Impairment: Handouts: Caring for Your Ulcer Methods: Demonstration, Explain/Verbal Responses: State content correctly Electronic Signature(s) Signed: 04/13/2019 4:23:20 PM By: Harold Barban Entered By: Harold Barban on 04/13/2019 11:13:32 Sandy Roberson ([id number]) -------------------------------------------------------------------------------- Wound Assessment Details Patient Name: Sandy Roberson Date of Service: 04/13/2019 11:00 AM Medical Record Number: [id number] Patient Account Number: [id number] Date of Birth/Sex: 1915/08/31 (85 y.o. F) Treating RN: Montey Hora Primary Care Jeniffer Culliver: Tracie Harrier Other Clinician: Referring Dalphine Cowie: Tracie Harrier Treating Devean Skoczylas/Extender: Beverly Gust in Treatment: 2 Wound Status Wound Number: 1 Primary Arterial Insufficiency Ulcer Etiology: Wound Location: Right Metatarsal head first - Medial Wound Open Wounding Event: Gradually Appeared Status: Date Acquired: 03/16/2018 Comorbid Anemia, Congestive Heart Failure, Coronary Weeks Of Treatment: 2 History: Artery Disease, Hypertension, Peripheral Arterial Clustered Wound: No Disease, Peripheral Venous Disease, End Stage Pending Amputation On  Presentation Renal Disease Photos  Wound Measurements Length: (cm) 4.1 Width: (cm) 3 Depth: (cm) 1.9 Area: (cm) 9.66 Volume: (cm) 18.355 % Reduction in Area: 9.6% % Reduction in Volume: 14.1% Epithelialization: None Tunneling: No Undermining: No Wound Description Full Thickness With Exposed Support Classification: Structures Wound Margin: Flat and Intact Exudate Medium Amount: Exudate Type: Serous Exudate Color: amber Foul Odor After Cleansing: Yes Due to Product Use: No Slough/Fibrino Yes Wound Bed Granulation Amount: Small (1-33%) Exposed Structure Granulation Quality: Pink Fascia Exposed: No Necrotic Amount: Large (67-100%) Fat Layer (Subcutaneous Tissue) Exposed: Yes Necrotic Quality: Eschar, Adherent Slough Tendon Exposed: No Muscle Exposed: Yes Necrosis of Muscle: Yes Joint Exposed: No Bolser, Tylisa ([id number]) Bone Exposed: Yes Treatment Notes Wound #1 (Right, Medial Metatarsal head first) Notes Iodoflex, Barrier Cream, Drawtex, ABD, conform to secure Electronic Signature(s) Signed: 04/13/2019 4:30:26 PM By: Montey Hora Entered By: Montey Hora on 04/13/2019 11:04:47 Sandy Roberson ([id number]) -------------------------------------------------------------------------------- Vitals Details Patient Name: Sandy Roberson Date of Service: 04/13/2019 11:00 AM Medical Record Number: [id number] Patient Account Number: [id number] Date of Birth/Sex: [birth year] (86 y.o. F) Treating RN: Montey Hora Primary Care Eveleigh Crumpler: Tracie Harrier Other Clinician: Referring Kairi Tufo: Tracie Harrier Treating Hiroto Saltzman/Extender: Beverly Gust in Treatment: 2 Vital Signs Time Taken: 11:02 Temperature (F): 97.8 Height (in): 63 Pulse (bpm): 75 Weight (lbs): 118 Respiratory Rate (breaths/min): 16 Body Mass Index (BMI): 20.9 Blood Pressure (mmHg): 134/69 Reference Range: 80 - 120 mg / dl Electronic Signature(s) Signed: 04/13/2019  4:30:26 PM By: Montey Hora Entered By: Montey Hora on 04/13/2019 11:02:50

## 2019-04-13 NOTE — Progress Notes (Signed)
Sandy Roberson, Sandy Roberson ([id number]) Visit Report for 04/13/2019 Debridement Details Patient Name: Sandy Roberson Date of Service: 04/13/2019 11:00 AM Medical Record Number: [id number] Patient Account Number: [id number] Date of Birth/Sex: 10-May-1915 (85 y.o. F) Treating RN: Harold Barban Primary Care Provider: Tracie Harrier Other Clinician: Referring Provider: Tracie Harrier Treating Provider/Extender: Beverly Gust in Treatment: 2 Debridement Performed for Wound #1 Right,Medial Metatarsal head first Assessment: Performed By: Physician Tobi Bastos, MD Debridement Type: Debridement Severity of Tissue Pre Fat layer exposed Debridement: Level of Consciousness (Pre- Awake and Alert procedure): Pre-procedure Verification/Time Yes - 11:13 Out Taken: Start Time: 11:13 Pain Control: Lidocaine Total Area Debrided (L x W): 4.1 (cm) x 3 (cm) = 12.3 (cm) Tissue and other material Non-Viable, Slough, Subcutaneous, Slough debrided: Level: Skin/Subcutaneous Tissue Debridement Description: Excisional Instrument: Curette Bleeding: None End Time: 11:16 Procedural Pain: 0 Post Procedural Pain: 0 Response to Treatment: Procedure was tolerated well Level of Consciousness Awake and Alert (Post-procedure): Post Debridement Measurements of Total Wound Length: (cm) 4.1 Width: (cm) 3 Depth: (cm) 1.9 Volume: (cm) 18.355 Character of Wound/Ulcer Post Debridement: Improved Severity of Tissue Post Debridement: Fat layer exposed Post Procedure Diagnosis Same as Pre-procedure Electronic Signature(s) Signed: 04/13/2019 4:23:20 PM By: Harold Barban Signed: 04/13/2019 4:26:33 PM By: Tobi Bastos MD, MBA Entered By: Harold Barban on 04/13/2019 11:15:02 Sandy Roberson, Sandy Roberson ([id number]) Sandy Roberson ([id number]) -------------------------------------------------------------------------------- HPI Details Patient Name: Sandy Roberson Date of Service:  04/13/2019 11:00 AM Medical Record Number: [id number] Patient Account Number: [id number] Date of Birth/Sex: October 24, 1915 (85 y.o. F) Treating RN: Harold Barban Primary Care Provider: Tracie Harrier Other Clinician: Referring Provider: Tracie Harrier Treating Provider/Extender: Beverly Gust in Treatment: 2 History of Present Illness HPI Description: 03/30/2019 on evaluation today patient presents for initial evaluation here in our clinic as a referral from Dr. Selina Cooley office who is a local podiatrist. Unfortunately the patient has experienced significant issues with decreased ability to heal in regard to a surgical site and again this is a fairly complicated history. The patient has undergone 3 vascular interventions the most recent of which was actually performed on 12//20. This was subsequent to the patient having ABIs which were somewhat poor registering at 0.67 on the right with a TBI of 0.16 and on the left she was 0.77 with a TBI of 0.24. Subsequently the patient had had surgery to actually remove a portion of the first metatarsal on her right foot. They did not remove the toe although again from the patient's standpoint I am not 100% sure exactly the reasoning behind what was done or was not done but again I think a big part of the issue was trying to be as minimally invasive for the patient as possible do #1 to her arterial status #2 due to her age. Nonetheless unfortunately she has had some trouble with healing and when the sutures were removed the wound apparently dehisced. The patient does currently have more recent vascular intervention which again was on December 4 with Dr. Lazaro Arms and at this point the patient did have angioplasty which upon completion showed inline flow into the foot with less than 20% residual stenosis after angioplasties. There were multiple sites which had to be worked on during the procedure by Dr. Lucky Cowboy. Again the patient has very problematic blood  flow with regard to her arterial status at this point. She does have a history of dysphagia, stage III kidney disease, and congestive heart failure as well. 04/06/2019 on evaluation today patient appears to be doing in my opinion about the  same there may be slightly better with regard to the overall. With that being said she still is healing slowly obviously has only been 1 week and now the wounds do appear to be clean enough I still think there is can be quite a bit of work to do going forward before will be where we really need to be here. She does have an appointment with vascular on Wednesday which is just 2 days away for repeat vascular studies along with evaluation with the physician as well. This will be good information for [place] to know going forward as far as her arterial status and what we can or cannot do from the standpoint of debridement or otherwise. 12/28-Patient returns at 1 week with the right medial metatarsal wound, this appears to be better with the rim of granulation tissue noted. We have been using Iodoflex. Patient had vascular studies done that indicate good flow to this foot, the left foot has ABI of 0.59 and a TBI 0.34 and patient has vascular follow-up for revascularization efforts. Electronic Signature(s) Signed: 04/13/2019 11:21:32 AM By: Tobi Bastos MD, MBA Entered By: Tobi Bastos on 04/13/2019 11:21:31 Sandy Roberson, Sandy Roberson ([id number]) -------------------------------------------------------------------------------- Physical Exam Details Patient Name: Sandy Roberson Date of Service: 04/13/2019 11:00 AM Medical Record Number: [id number] Patient Account Number: [id number] Date of Birth/Sex: [birth year] (85 y.o. F) Treating RN: Harold Barban Primary Care Provider: Tracie Harrier Other Clinician: Referring Provider: Tracie Harrier Treating Provider/Extender: Beverly Gust in Treatment: 2 Constitutional alert and oriented x 3. sitting or  standing blood pressure is within target range for patient.. supine blood pressure is within target range for patient.. pulse regular and within target range for patient.Marland Kitchen respirations regular, non-labored and within target range for patient.Marland Kitchen temperature within target range for patient.. . . Well-nourished and well-hydrated in no acute distress. Notes The right foot medial wound is noted to have a rim of granulation tissue, the wound surface is otherwise mostly covered with densely adherent exudate, some of this was removed using a #3 curette Electronic Signature(s) Signed: 04/13/2019 11:23:39 AM By: Tobi Bastos MD, MBA Entered By: Tobi Bastos on 04/13/2019 11:23:38 Sandy Roberson ([id number]) -------------------------------------------------------------------------------- Physician Orders Details Patient Name: Sandy Roberson Date of Service: 04/13/2019 11:00 AM Medical Record Number: [id number] Patient Account Number: [id number] Date of Birth/Sex: 1916-02-03 (85 y.o. F) Treating RN: Harold Barban Primary Care Provider: Tracie Harrier Other Clinician: Referring Provider: Tracie Harrier Treating Provider/Extender: Beverly Gust in Treatment: 2 Verbal / Phone Orders: No Diagnosis Coding Wound Cleansing Wound #1 Right,Medial Metatarsal head first o Dial antibacterial soap, wash wounds, rinse and pat dry prior to dressing wounds Skin Barriers/Peri-Wound Care Wound #1 Right,Medial Metatarsal head first o Barrier cream Primary Wound Dressing Wound #1 Right,Medial Metatarsal head first o Iodoflex Secondary Dressing Wound #1 Right,Medial Metatarsal head first o ABD and Kerlix/Conform - Conform o Drawtex - Place Drawtex right behind iodoflex Dressing Change Frequency Wound #1 Right,Medial Metatarsal head first o Change Dressing Monday, Wednesday, Friday Follow-up Appointments Wound #1 Right,Medial Metatarsal head first o Return  Appointment in 1 week. Home Health Wound #1 Right,Medial Metatarsal head first o [place] for [place] to change Wednesdays and Fridays o Continue Home Health Visits o Home Health Nurse may visit PRN to address patientos wound care needs. o FACE TO FACE ENCOUNTER: MEDICARE and MEDICAID PATIENTS: I certify that this patient is under my care and that I had a face-to-face encounter that meets the physician face-to-face encounter requirements with this patient on  this date. The encounter with the patient was in whole or in part for the following MEDICAL CONDITION: (primary reason for [place]) MEDICAL NECESSITY: I certify, that based on my findings, NURSING services are a medically necessary home health service. HOME BOUND STATUS: I certify that my clinical findings support that this patient is homebound (i.e., Due to illness or injury, pt requires aid of supportive devices such as crutches, cane, wheelchairs, walkers, the use of special transportation or the assistance of another person to leave their place of residence. There is a normal inability to leave the home and doing so requires considerable and taxing effort. Other absences are for medical reasons / religious services and are infrequent or of short duration when for other reasons). ADAEZE, BRASHIER ([id number]) o If current dressing causes regression in wound condition, may D/C ordered dressing product/s and apply Normal Saline Moist Dressing daily until next [place] / Other MD appointment. [place] of regression in wound condition at [phone]. o Please direct any NON-WOUND related issues/requests for orders to patient's Primary Care Physician Electronic Signature(s) Signed: 04/13/2019 4:23:20 PM By: Harold Barban Signed: 04/13/2019 4:26:33 PM By: Tobi Bastos MD, MBA Entered By: Harold Barban on 04/13/2019 11:18:14 Sandy Roberson  ([id number]) -------------------------------------------------------------------------------- Progress Note Details Patient Name: Sandy Roberson Date of Service: 04/13/2019 11:00 AM Medical Record Number: [id number] Patient Account Number: [id number] Date of Birth/Sex: 1916/01/17 (85 y.o. F) Treating RN: Harold Barban Primary Care Provider: Tracie Harrier Other Clinician: Referring Provider: Tracie Harrier Treating Provider/Extender: Beverly Gust in Treatment: 2 Subjective History of Present Illness (HPI) 03/30/2019 on evaluation today patient presents for initial evaluation here in our clinic as a referral from Dr. Selina Cooley office who is a local podiatrist. Unfortunately the patient has experienced significant issues with decreased ability to heal in regard to a surgical site and again this is a fairly complicated history. The patient has undergone 3 vascular interventions the most recent of which was actually performed on 12//20. This was subsequent to the patient having ABIs which were somewhat poor registering at 0.67 on the right with a TBI of 0.16 and on the left she was 0.77 with a TBI of 0.24. Subsequently the patient had had surgery to actually remove a portion of the first metatarsal on her right foot. They did not remove the toe although again from the patient's standpoint I am not 100% sure exactly the reasoning behind what was done or was not done but again I think a big part of the issue was trying to be as minimally invasive for the patient as possible do #1 to her arterial status #2 due to her age. Nonetheless unfortunately she has had some trouble with healing and when the sutures were removed the wound apparently dehisced. The patient does currently have more recent vascular intervention which again was on December 4 with Dr. Lazaro Arms and at this point the patient did have angioplasty which upon completion showed inline flow into the foot with less than 20%  residual stenosis after angioplasties. There were multiple sites which had to be worked on during the procedure by Dr. Lucky Cowboy. Again the patient has very problematic blood flow with regard to her arterial status at this point. She does have a history of dysphagia, stage III kidney disease, and congestive heart failure as well. 04/06/2019 on evaluation today patient appears to be doing in my opinion about the same there may be slightly better with regard to the overall. With that being said she  still is healing slowly obviously has only been 1 week and now the wounds do appear to be clean enough I still think there is can be quite a bit of work to do going forward before will be where we really need to be here. She does have an appointment with vascular on Wednesday which is just 2 days away for repeat vascular studies along with evaluation with the physician as well. This will be good information for [place] to know going forward as far as her arterial status and what we can or cannot do from the standpoint of debridement or otherwise. 12/28-Patient returns at 1 week with the right medial metatarsal wound, this appears to be better with the rim of granulation tissue noted. We have been using Iodoflex. Patient had vascular studies done that indicate good flow to this foot, the left foot has ABI of 0.59 and a TBI 0.34 and patient has vascular follow-up for revascularization efforts. Objective Constitutional alert and oriented x 3. sitting or standing blood pressure is within target range for patient.. supine blood pressure is within target range for patient.. pulse regular and within target range for patient.Marland Kitchen respirations regular, non-labored and within target range for patient.Marland Kitchen temperature within target range for patient.. Well-nourished and well-hydrated in no acute distress. Vitals Time Taken: 11:02 AM, Height: 63 in, Weight: 118 lbs, BMI: 20.9, Temperature: 97.8 F, Pulse: 75 bpm, Respiratory Rate: 16  breaths/min, Blood Pressure: 134/69 mmHg. Sandy Roberson, Sandy Roberson ([id number]) General Notes: The right foot medial wound is noted to have a rim of granulation tissue, the wound surface is otherwise mostly covered with densely adherent exudate, some of this was removed using a #3 curette Integumentary (Hair, Skin) Wound #1 status is Open. Original cause of wound was Gradually Appeared. The wound is located on the Right,Medial Metatarsal head first. The wound measures 4.1cm length x 3cm width x 1.9cm depth; 9.66cm^2 area and 18.355cm^3 volume. There is bone, muscle, and Fat Layer (Subcutaneous Tissue) Exposed exposed. There is no tunneling or undermining noted. There is a medium amount of serous drainage noted. Foul odor after cleansing was noted. The wound margin is flat and intact. There is small (1-33%) pink granulation within the wound bed. There is a large (67-100%) amount of necrotic tissue within the wound bed including Eschar, Adherent Slough and Necrosis of Muscle. 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 There was a Excisional Skin/Subcutaneous Tissue Debridement with a total area of 12.3 sq cm performed by Tobi Bastos, MD. With the following instrument(s): Curette to remove Non-Viable tissue/material. Material removed includes Subcutaneous Tissue and Slough and after achieving pain control using Lidocaine. No specimens were taken. A time out was conducted at 11:13, prior to the start of the procedure. There was no bleeding. The procedure was tolerated well with a pain level of 0 throughout and a pain level of 0 following the procedure. Post Debridement Measurements: 4.1cm length x 3cm width x 1.9cm depth; 18.355cm^3 volume. Character of Wound/Ulcer Post Debridement is improved. Severity of Tissue Post Debridement is: Fat layer  exposed. Post procedure Diagnosis Wound #1: Same as Pre-Procedure Plan Wound Cleansing: Wound #1 Right,Medial Metatarsal head first: Dial antibacterial soap, wash wounds, rinse and pat dry prior to dressing wounds Skin Barriers/Peri-Wound Care: Wound #1 Right,Medial Metatarsal head first: Barrier cream Primary Wound Dressing: Wound #1 Right,Medial Metatarsal head first: Iodoflex Secondary Dressing: Wound #1 Right,Medial  Metatarsal head first: ABD and Kerlix/Conform - Conform Drawtex - Place Drawtex right behind iodoflex Dressing Change Frequency: Wound #1 Right,Medial Metatarsal head first: Change Dressing Monday, Wednesday, Friday Follow-up Appointments: Wound #1 Right,Medial Metatarsal head first: Return Appointment in 1 week. Home Health: Wound #1 Right,Medial Metatarsal head first: [place] for [place] to change Wednesdays and Fridays [place] Visits Sandy Roberson, Sandy Roberson ([id number]) Home Health Nurse may visit PRN to address patient s wound care needs. FACE TO FACE ENCOUNTER: MEDICARE and MEDICAID PATIENTS: I certify that this patient is under my care and that I had a face-to-face encounter that meets the physician face-to-face encounter requirements with this patient on this date. The encounter with the patient was in whole or in part for the following MEDICAL CONDITION: (primary reason for [place]) MEDICAL NECESSITY: I certify, that based on my findings, NURSING services are a medically necessary home health service. HOME BOUND STATUS: I certify that my clinical findings support that this patient is homebound (i.e., Due to illness or injury, pt requires aid of supportive devices such as crutches, cane, wheelchairs, walkers, the use of special transportation or the assistance of another person to leave their place of residence. There is a normal inability to leave the home and doing so requires considerable and taxing  effort. Other absences are for medical reasons / religious services and are infrequent or of short duration when for other reasons). If current dressing causes regression in wound condition, may D/C ordered dressing product/s and apply Normal Saline Moist Dressing daily until next [place] / Other MD appointment. [place] of regression in wound condition at [phone]. Please direct any NON-WOUND related issues/requests for orders to patient's Primary Care Physician 1. Continue using Iodoflex primary dressing to the right foot wound 2. Continue current efforts at offloading and protecting the area 3. Return to clinic next week 4. Patient has vascular appointment for angiogram scheduled for the left foot Electronic Signature(s) Signed: 04/13/2019 11:24:27 AM By: Tobi Bastos MD, MBA Entered By: Tobi Bastos on 04/13/2019 11:24:27 Sandy Roberson ([id number]) -------------------------------------------------------------------------------- SuperBill Details Patient Name: Sandy Roberson Date of Service: 04/13/2019 Medical Record Number: [id number] Patient Account Number: [id number] Date of Birth/Sex: 07/17/1915 (85 y.o. 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 (congestive) heart failure Facility Procedures CPT4 Code Description: [id number] - DEB SUBQ  TISSUE 20 SQ CM/< ICD-10 Diagnosis Description L97.513 Non-pressure chronic ulcer of other part of right foot with necr Modifier: osis of muscle Quantity: 1 Physician Procedures CPT4 Code Description: [id number] - WC PHYS SUBQ TISS 20 SQ CM ICD-10 Diagnosis Description L97.513 Non-pressure chronic ulcer of other part of right foot with necr Modifier: osis of muscle Quantity: 1 Electronic Signature(s) Signed: 04/13/2019 11:24:43 AM By: Tobi Bastos MD, MBA Entered By: Tobi Bastos on 04/13/2019 11:24:43

## 2019-04-13 NOTE — Telephone Encounter (Signed)
Spoke with the patient's son [name] and the patient is now scheduled with Dr. Lucky Cowboy for LLE angio on 04/23/19 with a 6:45 am arrival time to the [facility]. Patient will do covid testing on 04/21/19 between 12:30-2:30 pm at the [facility]. Pre-procedure instructions will be mailed to the patient.

## 2019-04-20 ENCOUNTER — Other Ambulatory Visit: Payer: Self-pay

## 2019-04-20 ENCOUNTER — Encounter: Payer: Medicare Other | Attending: Physician Assistant | Admitting: Physician Assistant

## 2019-04-20 DIAGNOSIS — N186 End stage renal disease: Secondary | ICD-10-CM | POA: Insufficient documentation

## 2019-04-20 DIAGNOSIS — R1312 Dysphagia, oropharyngeal phase: Secondary | ICD-10-CM | POA: Diagnosis not present

## 2019-04-20 DIAGNOSIS — D631 Anemia in chronic kidney disease: Secondary | ICD-10-CM | POA: Diagnosis not present

## 2019-04-20 DIAGNOSIS — I132 Hypertensive heart and chronic kidney disease with heart failure and with stage 5 chronic kidney disease, or end stage renal disease: Secondary | ICD-10-CM | POA: Diagnosis not present

## 2019-04-20 DIAGNOSIS — I2510 Atherosclerotic heart disease of native coronary artery without angina pectoris: Secondary | ICD-10-CM | POA: Diagnosis not present

## 2019-04-20 DIAGNOSIS — L97521 Non-pressure chronic ulcer of other part of left foot limited to breakdown of skin: Secondary | ICD-10-CM | POA: Insufficient documentation

## 2019-04-20 DIAGNOSIS — L89312 Pressure ulcer of right buttock, stage 2: Secondary | ICD-10-CM | POA: Insufficient documentation

## 2019-04-20 DIAGNOSIS — L97513 Non-pressure chronic ulcer of other part of right foot with necrosis of muscle: Secondary | ICD-10-CM | POA: Diagnosis not present

## 2019-04-20 DIAGNOSIS — L97821 Non-pressure chronic ulcer of other part of left lower leg limited to breakdown of skin: Secondary | ICD-10-CM | POA: Diagnosis present

## 2019-04-20 DIAGNOSIS — I771 Stricture of artery: Secondary | ICD-10-CM | POA: Insufficient documentation

## 2019-04-20 DIAGNOSIS — L97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 of necrosis: Secondary | ICD-10-CM | POA: Diagnosis not present

## 2019-04-20 DIAGNOSIS — I50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 (congestive) heart failure: Secondary | ICD-10-CM | POA: Insufficient documentation

## 2019-04-20 NOTE — Progress Notes (Addendum)
TRELLA, MAHANNAH ([id number]) Visit Report for 04/20/2019 Chief Complaint Document Details Patient Name: Sandy Roberson, Sandy Roberson Date of Service: 04/20/2019 11:00 AM Medical Record Number: [id number] Patient Account Number: [id number] Date of Birth/Sex: 07-Nov-1915 (86 y.o. F) Treating RN: Harold Barban Primary Care Provider: Tracie Harrier Other Clinician: Referring Provider: Tracie Harrier Treating Provider/Extender: Melburn Hake, Mariah Gerstenberger Weeks in Treatment: 3 Information Obtained from: Patient Chief Complaint Right medial foot surgical ulcer, Left LE ulcers, and right gluteal ulcer Electronic Signature(s) Signed: 04/22/2019 9:33:30 AM By: Worthy Keeler PA-C Previous Signature: 04/20/2019 11:14:27 AM Version By: Worthy Keeler PA-C Entered By: Worthy Keeler on 04/22/2019 09:33:30 Sandy Roberson ([id number]) -------------------------------------------------------------------------------- HPI Details Patient Name: Sandy Roberson Date of Service: 04/20/2019 11:00 AM Medical Record Number: [id number] Patient Account Number: [id number] Date of Birth/Sex: January 26, 1916 (86 y.o. F) Treating RN: Harold Barban Primary Care Provider: Tracie Harrier Other Clinician: Referring Provider: Tracie Harrier Treating Provider/Extender: Melburn Hake, Anais Koenen Weeks in Treatment: 3 History of Present Illness HPI Description: 03/30/2019 on evaluation today patient presents for initial evaluation here in our clinic as a referral from Dr. Selina Cooley office who is a local podiatrist. Unfortunately the patient has experienced significant issues with decreased ability to heal in regard to a surgical site and again this is a fairly complicated history. The patient has undergone 3 vascular interventions the most recent of which was actually performed on 12//20. This was subsequent to the patient having ABIs which were somewhat poor registering at 0.67 on the right with a TBI of 0.16 and on the left she was  0.77 with a TBI of 0.24. Subsequently the patient had had surgery to actually remove a portion of the first metatarsal on her right foot. They did not remove the toe although again from the patient's standpoint I am not 100% sure exactly the reasoning behind what was done or was not done but again I think a big part of the issue was trying to be as minimally invasive for the patient as possible do #1 to her arterial status #2 due to her age. Nonetheless unfortunately she has had some trouble with healing and when the sutures were removed the wound apparently dehisced. The patient does currently have more recent vascular intervention which again was on December 4 with Dr. Lazaro Arms and at this point the patient did have angioplasty which upon completion showed inline flow into the foot with less than 20% residual stenosis after angioplasties. There were multiple sites which had to be worked on during the procedure by Dr. Lucky Cowboy. Again the patient has very problematic blood flow with regard to her arterial status at this point. She does have a history of dysphagia, stage III kidney disease, and congestive heart failure as well. 04/06/2019 on evaluation today patient appears to be doing in my opinion about the same there may be slightly better with regard to the overall. With that being said she still is healing slowly obviously has only been 1 week and now the wounds do appear to be clean enough I still think there is can be quite a bit of work to do going forward before will be where we really need to be here. She does have an appointment with vascular on Wednesday which is just 2 days away for repeat vascular studies along with evaluation with the physician as well. This will be good information for [place] to know going forward as far as her arterial status and what we can or cannot do from the standpoint of debridement or otherwise.  12/28-Patient returns at 1 week with the right medial metatarsal wound, this  appears to be better with the rim of granulation tissue noted. We have been using Iodoflex. Patient had vascular studies done that indicate good flow to this foot, the left foot has ABI of 0.59 and a TBI 0.34 and patient has vascular follow-up for revascularization efforts. 04/20/2019 on evaluation today patient's right first metatarsal ulcer actually appears to be doing about the same is making slow progress. With that being said she unfortunately has blisters over the left lower extremity, left medial ankle, and she still has apparently a pressure ulcer in the gluteal region which is in the right gluteus and is completely new to [place]. This is something that was not known of previous. In fact they did not even tell us about it today until at the end of the visit when we were getting ready to go and her son mention this. Electronic Signature(s) Signed: 04/22/2019 9:33:37 AM By: Worthy Keeler PA-C Entered By: Worthy Keeler on 04/22/2019 09:33:36 Sandy Roberson ([id number]) -------------------------------------------------------------------------------- Physical Exam Details Patient Name: Sandy Roberson Date of Service: 04/20/2019 11:00 AM Medical Record Number: [id number] Patient Account Number: [id number] Date of Birth/Sex: [birth year] (86 y.o. F) Treating RN: Harold Barban Primary Care Provider: Tracie Harrier Other Clinician: Referring Provider: Tracie Harrier Treating Provider/Extender: STONE III, Twana Wileman Weeks in Treatment: 3 Constitutional Well-nourished and well-hydrated in no acute distress. Respiratory normal breathing without difficulty. Psychiatric this patient is able to make decisions and demonstrates good insight into disease process. Alert and Oriented x 3. pleasant and cooperative. Notes Upon inspection today patient's wound bed actually showed signs of minimal improvement in regard to the first metatarsal region of the right foot. The wounds on the left lower  extremity in general are doing decently well they mainly appear to be ruptured blisters and are not very deep at all which is good news. The gluteal ulcer appears to be fairly superficial as well which is good news I am calling this a stage II at this point although obviously we do not want it to get any worse we definitely need to initiate treatment ASAP. Electronic Signature(s) Signed: 04/22/2019 9:34:17 AM By: Worthy Keeler PA-C Entered By: Worthy Keeler on 04/22/2019 09:34:16 Sandy Roberson ([id number]) -------------------------------------------------------------------------------- Physician Orders Details Patient Name: Sandy Roberson Date of Service: 04/20/2019 11:00 AM Medical Record Number: [id number] Patient Account Number: [id number] Date of Birth/Sex: 1915-06-14 (86 y.o. F) Treating RN: Harold Barban Primary Care Provider: Tracie Harrier Other Clinician: Referring Provider: Tracie Harrier Treating Provider/Extender: Melburn Hake, Blayden Conwell Weeks in Treatment: 3 Verbal / Phone Orders: No Diagnosis Coding ICD-10 Coding Code Description I73.89 Other specified peripheral vascular diseases T81.31XA Disruption of external operation (surgical) wound, not elsewhere classified, initial encounter L97.513 Non-pressure chronic ulcer of other part of right foot with necrosis of muscle R13.12 Dysphagia, oropharyngeal phase N18.30 Chronic kidney disease, stage 3 unspecified I50.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 (congestive) heart failure Wound Cleansing Wound #1 Right,Medial Metatarsal head first o Dial antibacterial soap, wash wounds, rinse and pat dry prior to dressing wounds Wound #2 Left,Medial Lower Leg o Dial antibacterial soap, wash wounds, rinse and pat dry prior to dressing wounds Wound #3 Left,Medial Ankle o Dial antibacterial soap, wash wounds, rinse and pat dry prior to dressing wounds Wound #4 Right Gluteus o Dial antibacterial  soap, wash wounds, rinse and pat dry prior to dressing wounds Skin Barriers/Peri-Wound Care Wound #1 Right,Medial Metatarsal head first o Barrier cream Wound #2  Left,Medial Lower Leg o Barrier cream Wound #3 Left,Medial Ankle o Barrier cream Primary Wound Dressing Wound #1 Right,Medial Metatarsal head first o Iodoflex Wound #2 Left,Medial Lower Leg o Silver Alginate Wound #3 Left,Medial Ankle o Silver Alginate Wound #4 Right Gluteus AMADITA, STONEBREAKER ([id number]) o Silver Collagen Secondary Dressing Wound #1 Right,Medial Metatarsal head first o ABD and Kerlix/Conform - Conform o Conform/Kerlix - conform to secure o Drawtex - Place Drawtex right behind iodoflex Wound #2 Left,Medial Lower Leg o Conform/Kerlix - conform to secure Wound #3 Left,Medial Ankle o Conform/Kerlix - conform to secure Wound #4 Right Gluteus o Boardered Foam Dressing Dressing Change Frequency Wound #1 Right,Medial Metatarsal head first o Change Dressing Monday, Wednesday, Friday Wound #2 Left,Medial Lower Leg o Change Dressing Monday, Wednesday, Friday Wound #3 Left,Medial Ankle o Change Dressing Monday, Wednesday, Friday Wound #4 Right Gluteus o Change Dressing Monday, Wednesday, Friday Follow-up Appointments Wound #1 Right,Medial Metatarsal head first o Return Appointment in 1 week. Wound #2 Left,Medial Lower Leg o Return Appointment in 1 week. Wound #3 Left,Medial Ankle o Return Appointment in 1 week. Wound #4 Right Gluteus o Return Appointment in 1 week. Edema Control Wound #2 Left,Medial Lower Leg o Other: - Tubi Grip single layer Wound #3 Left,Medial Ankle o Other: - Tubi Grip single layer Home Health Wound #1 Right,Medial Metatarsal head first o [place] for [place] to change Wednesdays and Fridays o Continue Home Health Visits o Home Health Nurse may visit PRN to address patientos wound  care needs. MARCELLENE, RAGGS ([id number]) o FACE TO FACE ENCOUNTER: MEDICARE and MEDICAID PATIENTS: I certify that this patient is under my care and that I had a face-to-face encounter that meets the physician face-to-face encounter requirements with this patient on this date. The encounter with the patient was in whole or in part for the following MEDICAL CONDITION: (primary reason for [place]) MEDICAL NECESSITY: I certify, that based on my findings, NURSING services are a medically necessary home health service. HOME BOUND STATUS: I certify that my clinical findings support that this patient is homebound (i.e., Due to illness or injury, pt requires aid of supportive devices such as crutches, cane, wheelchairs, walkers, the use of special transportation or the assistance of another person to leave their place of residence. There is a normal inability to leave the home and doing so requires considerable and taxing effort. Other absences are for medical reasons / religious services and are infrequent or of short duration when for other reasons). o If current dressing causes regression in wound condition, may D/C ordered dressing product/s and apply Normal Saline Moist Dressing daily until next [place] / Other MD appointment. [place] of regression in wound condition at [phone]. o Please direct any NON-WOUND related issues/requests for orders to patient's Primary Care Physician Electronic Signature(s) Signed: 04/22/2019 6:44:22 PM By: Worthy Keeler PA-C Previous Signature: 04/20/2019 4:45:12 PM Version By: Harold Barban Entered By: Worthy Keeler on 04/22/2019 09:38:56 [place], Lorenso Courier ([id number]) -------------------------------------------------------------------------------- Problem List Details Patient Name: Sandy Roberson Date of Service: 04/20/2019 11:00 AM Medical Record Number: [id number] Patient Account Number:  [id number] Date of Birth/Sex: [birth year] (86 y.o. F) Treating RN: Harold Barban Primary Care Provider: Tracie Harrier Other Clinician: Referring Provider: Tracie Harrier Treating Provider/Extender: Melburn Hake, Shelina Luo Weeks in Treatment: 3 Active Problems ICD-10 Evaluated Encounter Code Description Active Date Today Diagnosis I73.89 Other specified peripheral vascular diseases 03/30/2019 No Yes T81.31XA Disruption of external operation (surgical) wound, not  03/30/2019 No Yes elsewhere classified, initial encounter L97.513 Non-pressure chronic ulcer of other part of right foot with 03/30/2019 No Yes necrosis of muscle L89.312 Pressure ulcer of right buttock, stage 2 04/22/2019 No Yes L97.821 Non-pressure chronic ulcer of other part of left lower leg 04/22/2019 No Yes limited to breakdown of skin R13.12 Dysphagia, oropharyngeal phase 03/30/2019 No Yes N18.30 Chronic kidney disease, stage 3 unspecified 03/30/2019 No Yes I50.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 0000000 No Yes (congestive) heart failure Inactive Problems Resolved Problems MARKAYA, WILKOWSKI ([id number]) Electronic Signature(s) Signed: 04/22/2019 9:32:58 AM By: Worthy Keeler PA-C Previous Signature: 04/20/2019 11:14:19 AM Version By: Worthy Keeler PA-C Entered By: Worthy Keeler on 04/22/2019 09:32:58 Sandy Roberson ([id number]) -------------------------------------------------------------------------------- Progress Note Details Patient Name: Sandy Roberson Date of Service: 04/20/2019 11:00 AM Medical Record Number: [id number] Patient Account Number: [id number] Date of Birth/Sex: 06-03-1915 (86 y.o. F) Treating RN: Harold Barban Primary Care Provider: Tracie Harrier Other Clinician: Referring Provider: Tracie Harrier Treating Provider/Extender: Melburn Hake, Tanette Chauca Weeks in Treatment: 3 Subjective Chief Complaint Information obtained from Patient Right medial foot surgical  ulcer, Left LE ulcers, and right gluteal ulcer History of Present Illness (HPI) 03/30/2019 on evaluation today patient presents for initial evaluation here in our clinic as a referral from Dr. Selina Cooley office who is a local podiatrist. Unfortunately the patient has experienced significant issues with decreased ability to heal in regard to a surgical site and again this is a fairly complicated history. The patient has undergone 3 vascular interventions the most recent of which was actually performed on 12//20. This was subsequent to the patient having ABIs which were somewhat poor registering at 0.67 on the right with a TBI of 0.16 and on the left she was 0.77 with a TBI of 0.24. Subsequently the patient had had surgery to actually remove a portion of the first metatarsal on her right foot. They did not remove the toe although again from the patient's standpoint I am not 100% sure exactly the reasoning behind what was done or was not done but again I think a big part of the issue was trying to be as minimally invasive for the patient as possible do #1 to her arterial status #2 due to her age. Nonetheless unfortunately she has had some trouble with healing and when the sutures were removed the wound apparently dehisced. The patient does currently have more recent vascular intervention which again was on December 4 with Dr. Lazaro Arms and at this point the patient did have angioplasty which upon completion showed inline flow into the foot with less than 20% residual stenosis after angioplasties. There were multiple sites which had to be worked on during the procedure by Dr. Lucky Cowboy. Again the patient has very problematic blood flow with regard to her arterial status at this point. She does have a history of dysphagia, stage III kidney disease, and congestive heart failure as well. 04/06/2019 on evaluation today patient appears to be doing in my opinion about the same there may be slightly better with regard to  the overall. With that being said she still is healing slowly obviously has only been 1 week and now the wounds do appear to be clean enough I still think there is can be quite a bit of work to do going forward before will be where we really need to be here. She does have an appointment with vascular on Wednesday which is just 2 days away for repeat vascular studies along with evaluation with the physician  as well. This will be good information for [place] to know going forward as far as her arterial status and what we can or cannot do from the standpoint of debridement or otherwise. 12/28-Patient returns at 1 week with the right medial metatarsal wound, this appears to be better with the rim of granulation tissue noted. We have been using Iodoflex. Patient had vascular studies done that indicate good flow to this foot, the left foot has ABI of 0.59 and a TBI 0.34 and patient has vascular follow-up for revascularization efforts. 04/20/2019 on evaluation today patient's right first metatarsal ulcer actually appears to be doing about the same is making slow progress. With that being said she unfortunately has blisters over the left lower extremity, left medial ankle, and she still has apparently a pressure ulcer in the gluteal region which is in the right gluteus and is completely new to [place]. This is something that was not known of previous. In fact they did not even tell us about it today until at the end of the visit when we were getting ready to go and her son mention this. Objective CATHERINA, VEREB ([id number]) Constitutional Well-nourished and well-hydrated in no acute distress. Vitals Time Taken: 11:16 AM, Height: 63 in, Weight: 118 lbs, BMI: 20.9, Temperature: 97.4 F, Pulse: 73 bpm, Respiratory Rate: 16 breaths/min, Blood Pressure: 139/62 mmHg. Respiratory normal breathing without difficulty. Psychiatric this patient is able to make decisions and demonstrates good insight into disease  process. Alert and Oriented x 3. pleasant and cooperative. General Notes: Upon inspection today patient's wound bed actually showed signs of minimal improvement in regard to the first metatarsal region of the right foot. The wounds on the left lower extremity in general are doing decently well they mainly appear to be ruptured blisters and are not very deep at all which is good news. The gluteal ulcer appears to be fairly superficial as well which is good news I am calling this a stage II at this point although obviously we do not want it to get any worse we definitely need to initiate treatment ASAP. Integumentary (Hair, Skin) Wound #1 status is Open. Original cause of wound was Gradually Appeared. The wound is located on the Right,Medial Metatarsal head first. The wound measures 3.8cm length x 3cm width x 2cm depth; 8.954cm^2 area and 17.907cm^3 volume. There is bone, muscle, and Fat Layer (Subcutaneous Tissue) Exposed exposed. There is no tunneling or undermining noted. There is a medium amount of serous drainage noted. Foul odor after cleansing was noted. The wound margin is flat and intact. There is small (1-33%) pink granulation within the wound bed. There is a large (67-100%) amount of necrotic tissue within the wound bed including Eschar, Adherent Slough and Necrosis of Muscle. Wound #2 status is Open. Original cause of wound was Blister. The wound is located on the Left,Medial Lower Leg. The wound measures 3.2cm length x 4cm width x 0.1cm depth; 10.053cm^2 area and 1.005cm^3 volume. The wound is limited to skin breakdown. There is no tunneling or undermining noted. There is a large amount of serous drainage noted. The wound margin is flat and intact. There is large (67-100%) pink granulation within the wound bed. There is no necrotic tissue within the wound bed. Wound #3 status is Open. Original cause of wound was Blister. The wound is located on the Left,Medial Ankle. The wound measures  1.2cm length x 1cm width x 0.1cm depth; 0.942cm^2 area and 0.094cm^3 volume. The wound is limited to skin breakdown. There is  no tunneling or undermining noted. There is a large amount of serous drainage noted. The wound margin is flat and intact. There is large (67-100%) pink granulation within the wound bed. There is no necrotic tissue within the wound bed. Wound #4 status is Open. Original cause of wound was Pressure Injury. The wound is located on the Right Gluteus. The wound measures 4.2cm length x 2cm width x 0.1cm depth; 6.597cm^2 area and 0.66cm^3 volume. There is Fat Layer (Subcutaneous Tissue) Exposed exposed. There is no tunneling or undermining noted. There is a medium amount of serous drainage noted. The wound margin is flat and intact. There is small (1-33%) pink granulation within the wound bed. There is a medium (34-66%) amount of necrotic tissue within the wound bed including Adherent Slough. Other Condition(s) Patient presents with Other Dermatologic Condition located on the Left Foot. General Notes: patient with blood blister on her right 3rd toe Assessment Active Problems DENISHIA, RYCKMAN ([id number]) ICD-10 Other specified peripheral vascular diseases Disruption of external operation (surgical) wound, not elsewhere classified, initial encounter Non-pressure chronic ulcer of other part of right foot with necrosis of muscle Pressure ulcer of right buttock, stage 2 Non-pressure chronic ulcer of other part of left lower leg limited to breakdown of skin Dysphagia, oropharyngeal phase Chronic kidney disease, stage 3 unspecified Chronic combined systolic (congestive) and diastolic (congestive) heart failure Plan Wound Cleansing: Wound #1 Right,Medial Metatarsal head first: Dial antibacterial soap, wash wounds, rinse and pat dry prior to dressing wounds Wound #2 Left,Medial Lower Leg: Dial antibacterial soap, wash wounds, rinse and pat dry prior to dressing wounds Wound  #3 Left,Medial Ankle: Dial antibacterial soap, wash wounds, rinse and pat dry prior to dressing wounds Wound #4 Right Gluteus: Dial antibacterial soap, wash wounds, rinse and pat dry prior to dressing wounds Skin Barriers/Peri-Wound Care: Wound #1 Right,Medial Metatarsal head first: Barrier cream Wound #2 Left,Medial Lower Leg: Barrier cream Wound #3 Left,Medial Ankle: Barrier cream Primary Wound Dressing: Wound #1 Right,Medial Metatarsal head first: Iodoflex Wound #2 Left,Medial Lower Leg: Silver Alginate Wound #3 Left,Medial Ankle: Silver Alginate Wound #4 Right Gluteus: Silver Collagen Secondary Dressing: Wound #1 Right,Medial Metatarsal head first: ABD and Kerlix/Conform - Conform Conform/Kerlix - conform to secure Drawtex - Place Drawtex right behind iodoflex Wound #2 Left,Medial Lower Leg: Conform/Kerlix - conform to secure Wound #3 Left,Medial Ankle: Conform/Kerlix - conform to secure Wound #4 Right Gluteus: Boardered Foam Dressing Dressing Change Frequency: Wound #1 Right,Medial Metatarsal head first: Change Dressing Monday, Wednesday, Friday Wound #2 Left,Medial Lower Leg: Change Dressing Monday, Wednesday, Friday Wound #3 Left,Medial Ankle: KASHANA, MOLENAAR ([id number]) Change Dressing Monday, Wednesday, Friday Wound #4 Right Gluteus: Change Dressing Monday, Wednesday, Friday Follow-up Appointments: Wound #1 Right,Medial Metatarsal head first: Return Appointment in 1 week. Wound #2 Left,Medial Lower Leg: Return Appointment in 1 week. Wound #3 Left,Medial Ankle: Return Appointment in 1 week. Wound #4 Right Gluteus: Return Appointment in 1 week. Edema Control: Wound #2 Left,Medial Lower Leg: Other: - Tubi Grip single layer Wound #3 Left,Medial Ankle: Other: - Tubi Grip single layer Home Health: Wound #1 Right,Medial Metatarsal head first: [place] for [place] to change Wednesdays and Fridays [place] Nurse may visit PRN to address patient s wound care needs. FACE TO FACE ENCOUNTER: MEDICARE and MEDICAID PATIENTS: I certify that this patient is under my care and that I had a face-to-face encounter that meets the physician face-to-face encounter requirements with this patient on this date. The  encounter with the patient was in whole or in part for the following MEDICAL CONDITION: (primary reason for [place]) MEDICAL NECESSITY: I certify, that based on my findings, NURSING services are a medically necessary home health service. HOME BOUND STATUS: I certify that my clinical findings support that this patient is homebound (i.e., Due to illness or injury, pt requires aid of supportive devices such as crutches, cane, wheelchairs, walkers, the use of special transportation or the assistance of another person to leave their place of residence. There is a normal inability to leave the home and doing so requires considerable and taxing effort. Other absences are for medical reasons / religious services and are infrequent or of short duration when for other reasons). If current dressing causes regression in wound condition, may D/C ordered dressing product/s and apply Normal Saline Moist Dressing daily until next [place] / Other MD appointment. Maud of regression in wound condition at [phone]. Please direct any NON-WOUND related issues/requests for orders to patient's Primary Care Physician 1. In regard to the current ulcer on the first metatarsal region my suggestion here is good to be to continue with the Iodoflex I still think this is the best dressing of choice at this site. The Iodoflex does need to be packed into the wound as this is significantly deep. 2. With regard to the left medial lower leg as well as left medial ankle I would recommend a silver alginate dressing for both of these locations to keep things dry due to the  nature of them being blisters and obviously draining quite a bit. 3. With regard to the right gluteal region I do believe that this is going to be something that will hopefully heal quite rapidly. 4. I did discuss offloading with the patient and her son today as well I think this is going to be of utmost importance as far as getting everything to heal. 5. Also recommended Tubigrip single layer for the lower extremity to help with some edema control. We will see patient back for reevaluation in 1 week here in the clinic. If anything worsens or changes patient will contact our office for additional recommendations. Electronic Signature(s) Signed: 04/22/2019 9:44:50 AM By: Worthy Keeler PA-C Previous Signature: 04/22/2019 9:44:17 AM Version By: Worthy Keeler PA-C Entered By: Worthy Keeler on 04/22/2019 09:44:50 Crowl, Jontae ([id number]) ARNETRA, HUXFORD ([id number]) -------------------------------------------------------------------------------- SuperBill Details Patient Name: Sandy Roberson Date of Service: 04/20/2019 Medical Record Number: [id number] Patient Account Number: [id number] Date of Birth/Sex: 08-Oct-1915 (86 y.o. F) Treating RN: Harold Barban Primary Care Provider: Tracie Harrier Other Clinician: Referring Provider: Tracie Harrier Treating Provider/Extender: Melburn Hake, Syre Knerr Weeks in Treatment: 3 Diagnosis Coding ICD-10 Codes Code Description I73.89 Other specified peripheral vascular diseases T81.31XA Disruption of external operation (surgical) wound, not elsewhere classified, initial encounter L97.513 Non-pressure chronic ulcer of other part of right foot with necrosis of muscle L89.312 Pressure ulcer of right buttock, stage 2 L97.821 Non-pressure chronic ulcer of other part of left lower leg limited to breakdown of skin R13.12 Dysphagia, oropharyngeal phase N18.30 Chronic kidney disease, stage 3 unspecified I50.42 Chronic combined systolic  (congestive) and diastolic (congestive) heart failure Facility Procedures CPT4 Code: [id number] Description: 99213 - WOUND CARE VISIT-LEV 3 EST PT Modifier: Quantity: 1 Physician Procedures CPT4: Description Modifier Quantity Code [id number] - WC PHYS LEVEL 4 - EST PT 1 ICD-10 Diagnosis Description I73.89 Other specified peripheral vascular diseases T81.31XA Disruption of external operation (surgical) wound, not elsewhere classified,  initial  encounter L97.513 Non-pressure chronic ulcer of other part of right foot with necrosis of muscle L89.312 Pressure ulcer of right buttock, stage 2 Electronic Signature(s) Signed: 04/22/2019 9:45:04 AM By: Worthy Keeler PA-C Entered By: Worthy Keeler on 04/22/2019 09:45:04

## 2019-04-20 NOTE — Progress Notes (Addendum)
HAPPY, BALIAN ([id number]) Visit Report for 04/20/2019 Arrival Information Details Patient Name: Sandy Roberson, Sandy Roberson Date of Service: 04/20/2019 11:00 AM Medical Record Number: [id number] Patient Account Number: [id number] Date of Birth/Sex: 1915-08-01 (87 y.o. F) Treating RN: Montey Hora Primary Care Labrandon Knoch: Tracie Harrier Other Clinician: Referring Angelia Hazell: Tracie Harrier Treating Laporsche Hoeger/Extender: Melburn Hake, HOYT Weeks in Treatment: 3 Visit Information History Since Last Visit Added or deleted any medications: No Patient Arrived: Wheel Chair Any new allergies or adverse reactions: No Arrival Time: 11:15 Had a fall or experienced change in No Accompanied By: son activities of daily living that may affect Transfer Assistance: Manual risk of falls: Patient Identification Verified: Yes Signs or symptoms of abuse/neglect since last visito No Secondary Verification Process Yes Hospitalized since last visit: No Completed: Implantable device outside of the clinic excluding No Patient Has Alerts: Yes cellular tissue based products placed in the center Patient Alerts: Patient on Blood since last visit: Thinner Has Dressing in Place as Prescribed: Yes aspirin 81 Pain Present Now: No 03/10/19 ABI L .77 R .67 TBI L .24 R .16 Electronic Signature(s) Signed: 04/20/2019 4:34:19 PM By: Montey Hora Entered By: Montey Hora on 04/20/2019 11:16:21 Sandy Roberson ([id number]) -------------------------------------------------------------------------------- Clinic Level of Care Assessment Details Patient Name: Sandy Roberson Date of Service: 04/20/2019 11:00 AM Medical Record Number: [id number] Patient Account Number: [id number] Date of Birth/Sex: [birth year] (86 y.o. F) Treating RN: Harold Barban Primary Care Delma Drone: Tracie Harrier Other Clinician: Referring Haiven Nardone: Tracie Harrier Treating Lindie Roberson/Extender: Melburn Hake, HOYT Weeks in Treatment:  3 Clinic Level of Care Assessment Items TOOL 4 Quantity Score []  - Use when only an EandM is performed on FOLLOW-UP visit 0 ASSESSMENTS - Nursing Assessment / Reassessment X - Reassessment of Co-morbidities (includes updates in patient status) 1 10 X- 1 5 Reassessment of Adherence to Treatment Plan ASSESSMENTS - Wound and Skin Assessment / Reassessment X - Simple Wound Assessment / Reassessment - one wound 1 5 []  - 0 Complex Wound Assessment / Reassessment - multiple wounds []  - 0 Dermatologic / Skin Assessment (not related to wound area) ASSESSMENTS - Focused Assessment []  - Circumferential Edema Measurements - multi extremities 0 []  - 0 Nutritional Assessment / Counseling / Intervention []  - 0 Lower Extremity Assessment (monofilament, tuning fork, pulses) []  - 0 Peripheral Arterial Disease Assessment (using hand held doppler) ASSESSMENTS - Ostomy and/or Continence Assessment and Care []  - Incontinence Assessment and Management 0 []  - 0 Ostomy Care Assessment and Management (repouching, etc.) PROCESS - Coordination of Care X - Simple Patient / Family Education for ongoing care 1 15 []  - 0 Complex (extensive) Patient / Family Education for ongoing care []  - 0 Staff obtains Programmer, systems, Records, Test Results / Process Orders X- 1 10 Staff telephones HHA, Nursing Homes / Clarify orders / etc []  - 0 Routine Transfer to another Facility (non-emergent condition) []  - 0 Routine Hospital Admission (non-emergent condition) []  - 0 New Admissions / Biomedical engineer / Ordering NPWT, Apligraf, etc. []  - 0 Emergency Hospital Admission (emergent condition) X- 1 10 Simple Discharge Coordination Sandy Roberson, Sandy Roberson ([id number]) []  - 0 Complex (extensive) Discharge Coordination PROCESS - Special Needs []  - Pediatric / Minor Patient Management 0 []  - 0 Isolation Patient Management []  - 0 Hearing / Language / Visual special needs []  - 0 Assessment of Community assistance  (transportation, D/C planning, etc.) []  - 0 Additional assistance / Altered mentation []  - 0 Support Surface(s) Assessment (bed, cushion, seat, etc.) INTERVENTIONS - Wound Cleansing / Measurement X - Simple Wound Cleansing -  one wound 1 5 []  - 0 Complex Wound Cleansing - multiple wounds X- 1 5 Wound Imaging (photographs - any number of wounds) []  - 0 Wound Tracing (instead of photographs) X- 1 5 Simple Wound Measurement - one wound []  - 0 Complex Wound Measurement - multiple wounds INTERVENTIONS - Wound Dressings X - Small Wound Dressing one or multiple wounds 1 10 []  - 0 Medium Wound Dressing one or multiple wounds []  - 0 Large Wound Dressing one or multiple wounds []  - 0 Application of Medications - topical []  - 0 Application of Medications - injection INTERVENTIONS - Miscellaneous []  - External ear exam 0 []  - 0 Specimen Collection (cultures, biopsies, blood, body fluids, etc.) []  - 0 Specimen(s) / Culture(s) sent or taken to Lab for analysis []  - 0 Patient Transfer (multiple staff / Civil Service fast streamer / Similar devices) []  - 0 Simple Staple / Suture removal (25 or less) []  - 0 Complex Staple / Suture removal (26 or more) []  - 0 Hypo / Hyperglycemic Management (close monitor of Blood Glucose) []  - 0 Ankle / Brachial Index (ABI) - do not check if billed separately X- 1 5 Vital Signs Sandy Roberson, Sandy Roberson ([id number]) Has the patient been seen at the hospital within the last three years: Yes Total Score: 85 Level Of Care: New/Established - Level 3 Electronic Signature(s) Signed: 04/20/2019 4:45:12 PM By: Harold Barban Entered By: Harold Barban on 04/20/2019 11:57:20 Sandy Roberson ([id number]) -------------------------------------------------------------------------------- Encounter Discharge Information Details Patient Name: Sandy Roberson Date of Service: 04/20/2019 11:00 AM Medical Record Number: [id number] Patient Account Number: [id number] Date of  Birth/Sex: January 14, 1916 (86 y.o. F) Treating RN: Harold Barban Primary Care Valla Pacey: Tracie Harrier Other Clinician: Referring Carl Butner: Tracie Harrier Treating Dez Stauffer/Extender: Melburn Hake, HOYT Weeks in Treatment: 3 Encounter Discharge Information Items Discharge Condition: Stable Ambulatory Status: Wheelchair Discharge Destination: Home Transportation: Private Auto Accompanied By: caregiver Schedule Follow-up Appointment: Yes Clinical Summary of Care: Electronic Signature(s) Signed: 04/20/2019 4:45:12 PM By: Harold Barban Entered By: Harold Barban on 04/20/2019 12:04:44 Sandy Roberson ([id number]) -------------------------------------------------------------------------------- Lower Extremity Assessment Details Patient Name: Sandy Roberson Date of Service: 04/20/2019 11:00 AM Medical Record Number: [id number] Patient Account Number: [id number] Date of Birth/Sex: [birth year] (86 y.o. F) Treating RN: Montey Hora Primary Care Darald Uzzle: Tracie Harrier Other Clinician: Referring Zafirah Vanzee: Tracie Harrier Treating Rj Pedrosa/Extender: STONE III, HOYT Weeks in Treatment: 3 Edema Assessment Assessed: [Left: No] [Right: No] Edema: [Left: Yes] [Right: Yes] Vascular Assessment Pulses: Dorsalis Pedis Palpable: [Left:No] [Right:Yes] Electronic Signature(s) Signed: 04/20/2019 4:34:19 PM By: Montey Hora Entered By: Montey Hora on 04/20/2019 11:23:12 Sandy Roberson ([id number]) -------------------------------------------------------------------------------- Multi Wound Chart Details Patient Name: Sandy Roberson Date of Service: 04/20/2019 11:00 AM Medical Record Number: [id number] Patient Account Number: [id number] Date of Birth/Sex: [birth year] (86 y.o. 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 Leg Ulcer Comorbid History: Anemia, Congestive Heart Anemia, Congestive Heart Anemia, Congestive Heart Failure, Coronary Artery Failure, Coronary Artery Failure, Coronary Artery Disease, Hypertension, Disease, Hypertension, Disease, Hypertension, Peripheral Arterial Disease, Peripheral Arterial Disease, Peripheral Arterial Disease, Peripheral Venous Disease, Peripheral Venous Disease, Peripheral Venous Disease, End Stage Renal Disease  End Stage Renal Disease End Stage Renal Disease Date Acquired: 03/16/2018 04/18/2019 04/18/2019 Weeks of Treatment: 3 0 0 Wound Status: Open Open Open Pending Amputation on Yes No No Presentation: Measurements L x W x D 3.8x3x2 3.2x4x0.1 1.2x1x0.1 (cm) Area (cm) : 8.954 10.053 0.942 Volume (cm) : 17.907 1.005 0.094 % Reduction in Area: 16.20% N/A N/A % Reduction in Volume: 16.20% N/A N/A Classification: Full Thickness With Exposed Partial Thickness Partial Thickness Support Structures Exudate Amount: Medium Large Large Exudate Type: Serous Serous Serous Exudate Color: amber amber amber Foul Odor After Cleansing: Yes No No Odor Anticipated Due to No N/A N/A Product Use: Sandy Roberson, Sandy Roberson ([id number]) Wound Margin: Flat and Intact Flat and Intact Flat and Intact Granulation Amount: Small (1-33%) Large (67-100%) Large (67-100%) Granulation Quality: Pink Pink Pink Necrotic Amount: Large (67-100%) None Present (0%) None Present (0%) Necrotic Tissue: Eschar, Adherent Slough N/A N/A Exposed Structures: Fat Layer (Subcutaneous Fascia: No Fascia:  No Tissue) Exposed: Yes Fat Layer (Subcutaneous Fat Layer (Subcutaneous Muscle: Yes Tissue) Exposed: No Tissue) Exposed: No Bone: Yes Tendon: No Tendon: No Fascia: No Muscle: No Muscle: No Tendon: No Joint: No Joint: No Joint: No Bone: No Bone: No Limited to Skin Breakdown Limited to Skin Breakdown Epithelialization: None Medium (34-66%) Small (1-33%) Wound Number: 4 N/A N/A Photos: N/A N/A Wound Location: Right Gluteus N/A N/A Wounding Event: Pressure Injury N/A N/A Primary Etiology: Pressure Ulcer N/A N/A Comorbid History: Anemia, Congestive Heart N/A N/A Failure, Coronary Artery Disease, Hypertension, Peripheral Arterial Disease, Peripheral Venous Disease, End Stage Renal Disease Date Acquired: 10/15/2018 N/A N/A Weeks of Treatment: 0 N/A N/A Wound Status: Open N/A N/A Pending Amputation on No N/A N/A Presentation: Measurements L x W x D 4.2x2x0.1 N/A N/A (cm) Area (cm) : 6.597 N/A N/A Volume (cm) : 0.66 N/A N/A % Reduction in Area: 0.00% N/A N/A % Reduction in Volume: 0.00% N/A N/A Classification: Category/Stage II N/A N/A Exudate Amount: Medium N/A N/A Exudate Type: Serous N/A N/A Exudate Color: amber N/A N/A Foul Odor After Cleansing: No N/A N/A Odor Anticipated Due to N/A N/A N/A Product Use: Wound Margin: Flat and Intact N/A N/A Granulation Amount: Small (1-33%) N/A N/A Granulation Quality: Pink N/A N/A Necrotic Amount: Medium (34-66%) N/A N/A Necrotic Tissue: Adherent Slough N/A N/A Exposed Structures: N/A N/A CARRI, BLAZEVICH ([id number]) Fat Layer (Subcutaneous Tissue) Exposed: Yes Fascia: No Tendon: No Muscle: No Joint: No Bone: No Epithelialization: None N/A N/A Treatment Notes Wound #1 (Right, Medial Metatarsal head first) Notes Right-Iodoflex, drawtex, ABD, conform. Left- silver cell, tubi grip Wound #2 (Left, Medial Lower Leg) Notes Right-Iodoflex, drawtex, ABD, conform. Left- silver cell, tubi grip Wound #3 (Left, Medial  Ankle) Notes Right-Iodoflex, drawtex, ABD, conform. Left- silver cell, tubi grip Electronic Signature(s) Signed: 04/22/2019 9:38:40 AM By: Worthy Keeler PA-C Entered By: Worthy Keeler on 04/22/2019 09:38:40 Sandy Roberson, Sandy Roberson ([id number]) -------------------------------------------------------------------------------- Multi-Disciplinary Care Plan Details Patient Name: Sandy Roberson Date of Service: 04/20/2019 11:00 AM Medical Record Number: [id number] Patient Account Number: [id number] Date of Birth/Sex: 1916-02-23 (86 y.o. F) Treating RN: Harold Barban Primary Care Theador Jezewski: Tracie Harrier Other Clinician: Referring Aadam Zhen: Tracie Harrier Treating Marissah Vandemark/Extender: Melburn Hake, HOYT Weeks in Treatment: 3 Active Inactive Venous Leg Ulcer Nursing Diagnoses: Knowledge deficit related to disease process and management Goals: Patient/caregiver will verbalize understanding of disease process and disease management Date Initiated: 03/30/2019 Target Resolution Date: 03/30/2019 Goal Status: Active Interventions: Assess peripheral edema status every visit. Notes: Wound/Skin Impairment Nursing Diagnoses: Impaired tissue integrity Knowledge deficit related to  ulceration/compromised skin integrity Goals: Ulcer/skin breakdown will have a volume reduction of 30% by week 4 Date Initiated: 03/30/2019 Target Resolution Date: 04/30/2019 Goal Status: Active Interventions: Assess patient/caregiver ability to obtain necessary supplies Assess patient/caregiver ability to perform ulcer/skin care regimen upon admission and as needed Assess ulceration(s) every visit Provide education on ulcer and skin care Notes: Electronic Signature(s) Signed: 04/20/2019 4:45:12 PM By: Harold Barban Entered By: Harold Barban on 04/20/2019 11:53:07 Sandy Roberson ([id number]) -------------------------------------------------------------------------------- Non-Wound Condition Assessment  Details Patient Name: Sandy Roberson Date of Service: 04/20/2019 11:00 AM Medical Record Number: [id number] Patient Account Number: [id number] Date of Birth/Sex: [birth year] (86 y.o. F) Treating RN: Montey Hora Primary Care Fed Ceci: Tracie Harrier Other Clinician: Referring Eh Sesay: Tracie Harrier Treating Henrick Mcgue/Extender: Melburn Hake, HOYT Weeks in Treatment: 3 Non-Wound Condition: Condition: Other Dermatologic Condition Location: Foot Side: Left Photos Notes patient with blood blister on her right 3rd toe Electronic Signature(s) Signed: 04/20/2019 4:34:19 PM By: Montey Hora Entered By: Montey Hora on 04/20/2019 11:31:25 Sandy Roberson ([id number]) -------------------------------------------------------------------------------- Pain Assessment Details Patient Name: Sandy Roberson Date of Service: 04/20/2019 11:00 AM Medical Record Number: [id number] Patient Account Number: [id number] Date of Birth/Sex: November 22, 1915 (86 y.o. F) Treating RN: Montey Hora Primary Care Mayra Jolliffe: Tracie Harrier Other Clinician: Referring Pearl Berlinger: Tracie Harrier Treating Ryliee Figge/Extender: Melburn Hake, HOYT Weeks in Treatment: 3 Active Problems Location of Pain Severity and Description of Pain Patient Has Paino Yes Site Locations Pain Location: Generalized Pain With Dressing Change: Yes Pain Management and Medication Current Pain Management: Electronic Signature(s) Signed: 04/20/2019 4:34:19 PM By: Montey Hora Entered By: Montey Hora on 04/20/2019 11:16:37 Sandy Roberson ([id number]) -------------------------------------------------------------------------------- Patient/Caregiver Education Details Patient Name: Sandy Roberson Date of Service: 04/20/2019 11:00 AM Medical Record Number: [id number] Patient Account Number: [id number] Date of Birth/Gender: Nov 23, 1915 (86 y.o. F) Treating RN: Harold Barban Primary Care Physician: Tracie Harrier  Other Clinician: Referring Physician: Tracie Harrier Treating Physician/Extender: Sharalyn Ink in Treatment: 3 Education Assessment Education Provided To: Patient Education Topics Provided Wound/Skin Impairment: Handouts: Caring for Your Ulcer Methods: Demonstration, Explain/Verbal Responses: State content correctly Electronic Signature(s) Signed: 04/20/2019 4:45:12 PM By: Harold Barban Entered By: Harold Barban on 04/20/2019 11:53:43 Sandy Roberson ([id number]) -------------------------------------------------------------------------------- Wound Assessment Details Patient Name: Sandy Roberson Date of Service: 04/20/2019 11:00 AM Medical Record Number: [id number] Patient Account Number: [id number] Date of Birth/Sex: November 04, 1915 (86 y.o. F) Treating RN: Montey Hora Primary Care Chade Pitner: Tracie Harrier Other Clinician: Referring Desirey Keahey: Tracie Harrier Treating Juri Dinning/Extender: Melburn Hake, HOYT Weeks in Treatment: 3 Wound Status Wound Number: 1 Primary Arterial Insufficiency Ulcer Etiology: Wound Location: Right Metatarsal head first - Medial Wound Open Wounding Event: Gradually Appeared Status: Date Acquired: 03/16/2018 Comorbid Anemia, Congestive Heart Failure, Coronary Weeks Of Treatment: 3 History: Artery Disease, Hypertension, Peripheral Arterial Clustered Wound: No Disease, Peripheral Venous Disease, End Stage Pending Amputation On Presentation Renal Disease Photos Wound Measurements Length: (cm) 3.8 Width: (cm) 3 Depth: (cm) 2 Area: (cm) 8.954 Volume: (cm) 17.907 % Reduction in Area: 16.2% % Reduction in Volume: 16.2% Epithelialization: None Tunneling: No Undermining: No Wound Description Full Thickness With Exposed Support Classification: Structures Wound Margin: Flat and Intact Exudate Medium Amount: Exudate Type: Serous Exudate Color: amber Foul Odor After Cleansing: Yes Due to Product Use: No Slough/Fibrino  Yes Wound Bed Granulation Amount: Small (1-33%) Exposed Structure Granulation Quality: Pink Fascia Exposed: No Necrotic Amount: Large (67-100%) Fat Layer (Subcutaneous Tissue) Exposed: Yes Necrotic Quality: Eschar, Adherent Slough Tendon Exposed: No Muscle Exposed: Yes Necrosis of Muscle: Yes Joint Exposed: No Sandy Roberson, Sandy Roberson ([id number]) Bone  Exposed: Yes Treatment Notes Wound #1 (Right, Medial Metatarsal head first) Notes Right-Iodoflex, drawtex, ABD, conform. Left- silver cell, tubi grip Electronic Signature(s) Signed: 04/20/2019 4:34:19 PM By: Montey Hora Entered By: Montey Hora on 04/20/2019 11:25:25 Sandy Roberson ([id number]) -------------------------------------------------------------------------------- Wound Assessment Details Patient Name: Sandy Roberson Date of Service: 04/20/2019 11:00 AM Medical Record Number: [id number] Patient Account Number: [id number] Date of Birth/Sex: [birth year] (86 y.o. F) Treating RN: Montey Hora Primary Care Mikelle Myrick: Tracie Harrier Other Clinician: Referring Charde Macfarlane: Tracie Harrier Treating Jamilet Ambroise/Extender: Melburn Hake, HOYT Weeks in Treatment: 3 Wound Status Wound Number: 2 Primary Venous Leg Ulcer Etiology: Wound Location: Right Lower Leg - Medial Wound Open Wounding Event: Blister Status: Date Acquired: 04/18/2019 Comorbid Anemia, Congestive Heart Failure, Coronary Weeks Of Treatment: 0 History: Artery Disease, Hypertension, Peripheral Arterial Clustered Wound: No Disease, Peripheral Venous Disease, End Stage Renal Disease Photos Wound Measurements Length: (cm) 3.2 % Reduction in Width: (cm) 4 % Reduction in Depth: (cm) 0.1 Epithelializat Area: (cm) 10.053 Tunneling: Volume: (cm) 1.005 Undermining: Area: Volume: ion: Medium (34-66%) No No Wound Description Classification: Partial Thickness Foul Odor Afte Wound Margin: Flat and Intact Slough/Fibrino Exudate Amount: Large Exudate Type:  Serous Exudate Color: amber r Cleansing: No No Wound Bed Granulation Amount: Large (67-100%) Exposed Structure Granulation Quality: Pink Fascia Exposed: No Necrotic Amount: None Present (0%) Fat Layer (Subcutaneous Tissue) Exposed: No Tendon Exposed: No Muscle Exposed: No Joint Exposed: No Bone Exposed: No Limited to Skin Sandy Roberson, Sandy Roberson ([id number]) Electronic Signature(s) Signed: 04/20/2019 4:34:19 PM By: Montey Hora Entered By: Montey Hora on 04/20/2019 11:28:21 Sandy Roberson ([id number]) -------------------------------------------------------------------------------- Wound Assessment Details Patient Name: Sandy Roberson Date of Service: 04/20/2019 11:00 AM Medical Record Number: [id number] Patient Account Number: [id number] Date of Birth/Sex: June 16, 1915 (86 y.o. F) Treating RN: Montey Hora Primary Care Tynan Boesel: Tracie Harrier Other Clinician: Referring Jakim Drapeau: Tracie Harrier Treating Lysbeth Dicola/Extender: Melburn Hake, HOYT Weeks in Treatment: 3 Wound Status Wound Number: 3 Primary Venous Leg Ulcer Etiology: Wound Location: Right Ankle - Medial Wound Open Wounding Event: Blister Status: Date Acquired: 04/18/2019 Comorbid Anemia, Congestive Heart Failure, Coronary Weeks Of Treatment: 0 History: Artery Disease, Hypertension, Peripheral Arterial Clustered Wound: No Disease, Peripheral Venous Disease, End Stage Renal Disease Photos Wound Measurements Length: (cm) 1.2 % Reduction i Width: (cm) 1 % Reduction i Depth: (cm) 0.1 Epithelializa Area: (cm) 0.942 Tunneling: Volume: (cm) 0.094 Undermining: n Area: n Volume: tion: Small (1-33%) No No Wound Description Classification: Partial Thickness Foul Odor Aft Wound Margin: Flat and Intact Slough/Fibrin Exudate Amount: Large Exudate Type: Serous Exudate Color: amber er Cleansing: No o No Wound Bed Granulation Amount: Large (67-100%) Exposed Structure Granulation Quality:  Pink Fascia Exposed: No Necrotic Amount: None Present (0%) Fat Layer (Subcutaneous Tissue) Exposed: No Tendon Exposed: No Muscle Exposed: No Joint Exposed: No Bone Exposed: No Limited to Skin Sandy Roberson, Sandy Roberson ([id number]) Electronic Signature(s) Signed: 04/20/2019 4:34:19 PM By: Montey Hora Entered By: Montey Hora on 04/20/2019 11:29:39 Sandy Roberson ([id number]) -------------------------------------------------------------------------------- Wound Assessment Details Patient Name: Sandy Roberson Date of Service: 04/20/2019 11:00 AM Medical Record Number: [id number] Patient Account Number: [id number] Date of Birth/Sex: 1915-10-10 (86 y.o. F) Treating RN: Harold Barban Primary Care Cletus Paris: Tracie Harrier Other Clinician: Referring Marily Konczal: Tracie Harrier Treating Liahm Grivas/Extender: Melburn Hake, HOYT Weeks in Treatment: 3 Wound Status Wound Number: 4 Primary Pressure Ulcer Etiology: Wound Location: Right Gluteus Wound Open Wounding Event: Pressure Injury Status: Date Acquired: 10/15/2018 Comorbid Anemia, Congestive Heart Failure, Coronary Weeks Of Treatment: 0 History: Artery Disease, Hypertension, Peripheral Arterial Clustered Wound: No Disease,  Peripheral Venous Disease, End Stage Renal Disease Photos Wound Measurements Length: (cm) 4.2 % Reduction in Width: (cm) 2 % Reduction in Depth: (cm) 0.1 Epithelializati Area: (cm) 6.597 Tunneling: Volume: (cm) 0.66 Undermining: Area: 0% Volume: 0% on: None No No Wound Description Classification: Category/Stage II Foul Odor After Wound Margin: Flat and Intact Slough/Fibrino Exudate Amount: Medium Exudate Type: Serous Exudate Color: amber Cleansing: No Yes Wound Bed Granulation Amount: Small (1-33%) Exposed Structure Granulation Quality: Pink Fascia Exposed: No Necrotic Amount: Medium (34-66%) Fat Layer (Subcutaneous Tissue) Exposed: Yes Necrotic Quality: Adherent Slough Tendon  Exposed: No Muscle Exposed: No Joint Exposed: No Bone Exposed: No Sandy Roberson, Sandy Roberson ([id number]) Electronic Signature(s) Signed: 04/22/2019 9:38:08 AM By: Worthy Keeler PA-C Signed: 04/22/2019 3:59:27 PM By: Harold Barban Previous Signature: 04/20/2019 4:45:12 PM Version By: Harold Barban Entered By: Worthy Keeler on 04/22/2019 09:38:08 Sandy Roberson ([id number]) -------------------------------------------------------------------------------- Vitals Details Patient Name: Sandy Roberson Date of Service: 04/20/2019 11:00 AM Medical Record Number: [id number] Patient Account Number: [id number] Date of Birth/Sex: 1916-04-03 (86 y.o. F) Treating RN: Montey Hora Primary Care Damyon Mullane: Tracie Harrier Other Clinician: Referring Montrice Gracey: Tracie Harrier Treating Zayvier Caravello/Extender: Melburn Hake, HOYT Weeks in Treatment: 3 Vital Signs Time Taken: 11:16 Temperature (F): 97.4 Height (in): 63 Pulse (bpm): 73 Weight (lbs): 118 Respiratory Rate (breaths/min): 16 Body Mass Index (BMI): 20.9 Blood Pressure (mmHg): 139/62 Reference Range: 80 - 120 mg / dl Electronic Signature(s) Signed: 04/20/2019 4:34:19 PM By: Montey Hora Entered By: Montey Hora on 04/20/2019 11:17:00

## 2019-04-21 ENCOUNTER — Other Ambulatory Visit
Admission: RE | Admit: 2019-04-21 | Discharge: 2019-04-21 | Disposition: A | Discharge status: Home / Self Care | Payer: Medicare Other | Source: Ambulatory Visit | Attending: Vascular Surgery | Admitting: Vascular Surgery

## 2019-04-21 DIAGNOSIS — Z01812 Encounter for preprocedural laboratory examination: Secondary | ICD-10-CM | POA: Diagnosis present

## 2019-04-21 DIAGNOSIS — Z20822 Contact with and (suspected) exposure to covid-19: Secondary | ICD-10-CM | POA: Insufficient documentation

## 2019-04-22 ENCOUNTER — Other Ambulatory Visit (INDEPENDENT_AMBULATORY_CARE_PROVIDER_SITE_OTHER): Payer: Self-pay | Admitting: Nurse Practitioner

## 2019-04-22 LAB — SARS CORONAVIRUS 2 (TAT 6-24 HRS): SARS Coronavirus 2: NEGATIVE

## 2019-04-22 MED ORDER — CLINDAMYCIN PHOSPHATE 300 MG/50ML IV SOLN
300.0000 mg | Freq: Once | INTRAVENOUS | Status: AC
  Administered 2019-04-23: 300 mg via INTRAVENOUS

## 2019-04-23 ENCOUNTER — Encounter: Payer: Self-pay | Admitting: Vascular Surgery

## 2019-04-23 ENCOUNTER — Other Ambulatory Visit: Payer: Self-pay

## 2019-04-23 ENCOUNTER — Ambulatory Visit
Admission: RE | Admit: 2019-04-23 | Discharge: 2019-04-23 | Disposition: A | Discharge status: Home / Self Care | Payer: Medicare Other | Attending: Vascular Surgery | Admitting: Vascular Surgery

## 2019-04-23 ENCOUNTER — Encounter
Admission: RE | Disposition: A | Discharge status: Home / Self Care | Payer: Self-pay | Source: Home / Self Care | Attending: Vascular Surgery

## 2019-04-23 DIAGNOSIS — Z7982 Long term (current) use of aspirin: Secondary | ICD-10-CM | POA: Diagnosis not present

## 2019-04-23 DIAGNOSIS — E785 Hyperlipidemia, unspecified: Secondary | ICD-10-CM | POA: Insufficient documentation

## 2019-04-23 DIAGNOSIS — Z7902 Long term (current) use of antithrombotics/antiplatelets: Secondary | ICD-10-CM | POA: Diagnosis not present

## 2019-04-23 DIAGNOSIS — I70245 Atherosclerosis of native arteries of left leg with ulceration of other part of foot: Secondary | ICD-10-CM | POA: Diagnosis present

## 2019-04-23 DIAGNOSIS — Z79899 Other long term (current) drug therapy: Secondary | ICD-10-CM | POA: Diagnosis not present

## 2019-04-23 DIAGNOSIS — I10 Essential (primary) hypertension: Secondary | ICD-10-CM | POA: Diagnosis not present

## 2019-04-23 DIAGNOSIS — I70222 Atherosclerosis of native arteries of extremities with rest pain, left leg: Secondary | ICD-10-CM | POA: Diagnosis not present

## 2019-04-23 DIAGNOSIS — I70235 Atherosclerosis of native arteries of right leg with ulceration of other part of foot: Secondary | ICD-10-CM

## 2019-04-23 DIAGNOSIS — L97519 Non-pressure chronic ulcer of other part of right foot with unspecified severity: Secondary | ICD-10-CM | POA: Insufficient documentation

## 2019-04-23 DIAGNOSIS — Z89421 Acquired absence of other right toe(s): Secondary | ICD-10-CM | POA: Insufficient documentation

## 2019-04-23 DIAGNOSIS — Z880 Allergy status to penicillin: Secondary | ICD-10-CM | POA: Insufficient documentation

## 2019-04-23 DIAGNOSIS — Z955 Presence of coronary angioplasty implant and graft: Secondary | ICD-10-CM | POA: Insufficient documentation

## 2019-04-23 DIAGNOSIS — L97529 Non-pressure chronic ulcer of other part of left foot with unspecified severity: Secondary | ICD-10-CM | POA: Diagnosis not present

## 2019-04-23 DIAGNOSIS — I2510 Atherosclerotic heart disease of native coronary artery without angina pectoris: Secondary | ICD-10-CM | POA: Diagnosis not present

## 2019-04-23 DIAGNOSIS — I70219 Atherosclerosis of native arteries of extremities with intermittent claudication, unspecified extremity: Secondary | ICD-10-CM

## 2019-04-23 HISTORY — PX: LOWER EXTREMITY ANGIOGRAPHY: CATH118251

## 2019-04-23 LAB — BUN: BUN: 47 mg/dL — ABNORMAL HIGH (ref 8–23)

## 2019-04-23 LAB — CREATININE, SERUM
Creatinine, Ser: 1.25 mg/dL — ABNORMAL HIGH (ref 0.44–1.00)
GFR calc Af Amer: 41 mL/min — ABNORMAL LOW (ref >60)
GFR calc non Af Amer: 35 mL/min — ABNORMAL LOW (ref >60)

## 2019-04-23 SURGERY — LOWER EXTREMITY ANGIOGRAPHY
Anesthesia: Moderate Sedation | Laterality: Left

## 2019-04-23 MED ORDER — SODIUM CHLORIDE 0.9 % IV SOLN: 250.0000 mL | INTRAVENOUS | Status: DC | PRN

## 2019-04-23 MED ORDER — SODIUM CHLORIDE 0.9 % IV SOLN: INTRAVENOUS | Status: DC

## 2019-04-23 MED ORDER — METHYLPREDNISOLONE SODIUM SUCC 125 MG IJ SOLR: 125.0000 mg | Freq: Once | INTRAMUSCULAR | Status: DC | PRN

## 2019-04-23 MED ORDER — SODIUM CHLORIDE 0.9% FLUSH: 3.0000 mL | INTRAVENOUS | Status: DC | PRN

## 2019-04-23 MED ORDER — HYDROMORPHONE HCL 1 MG/ML IJ SOLN: 1.0000 mg | Freq: Once | INTRAMUSCULAR | Status: DC | PRN

## 2019-04-23 MED ORDER — SODIUM CHLORIDE 0.9 % IV BOLUS: 250.0000 mL | Freq: Once | INTRAVENOUS | Status: DC

## 2019-04-23 MED ORDER — ONDANSETRON HCL 4 MG/2ML IJ SOLN: 4.0000 mg | Freq: Four times a day (QID) | INTRAMUSCULAR | Status: DC | PRN

## 2019-04-23 MED ORDER — SODIUM CHLORIDE 0.9% FLUSH: 3.0000 mL | Freq: Two times a day (BID) | INTRAVENOUS | Status: DC

## 2019-04-23 MED ORDER — MIDAZOLAM HCL 2 MG/2ML IJ SOLN
INTRAMUSCULAR | Status: AC
  Filled 2019-04-23: qty 4

## 2019-04-23 MED ORDER — FAMOTIDINE 20 MG PO TABS: 40.0000 mg | ORAL_TABLET | Freq: Once | ORAL | Status: DC | PRN

## 2019-04-23 MED ORDER — FENTANYL CITRATE (PF) 100 MCG/2ML IJ SOLN
INTRAMUSCULAR | Status: DC | PRN
  Administered 2019-04-23 (×2): 25 ug via INTRAVENOUS

## 2019-04-23 MED ORDER — ACETAMINOPHEN 325 MG PO TABS: 650.0000 mg | ORAL_TABLET | ORAL | Status: DC | PRN

## 2019-04-23 MED ORDER — CLINDAMYCIN PHOSPHATE 300 MG/50ML IV SOLN
INTRAVENOUS | Status: AC
  Filled 2019-04-23: qty 50

## 2019-04-23 MED ORDER — MIDAZOLAM HCL 2 MG/ML PO SYRP: 8.0000 mg | ORAL_SOLUTION | Freq: Once | ORAL | Status: DC | PRN

## 2019-04-23 MED ORDER — LABETALOL HCL 5 MG/ML IV SOLN: 10.0000 mg | INTRAVENOUS | Status: DC | PRN

## 2019-04-23 MED ORDER — HEPARIN SODIUM (PORCINE) 1000 UNIT/ML IJ SOLN
INTRAMUSCULAR | Status: AC
  Administered 2019-04-23: 09:00:00 4000 [IU]
  Filled 2019-04-23: qty 1

## 2019-04-23 MED ORDER — MIDAZOLAM HCL 2 MG/2ML IJ SOLN
INTRAMUSCULAR | Status: DC | PRN
  Administered 2019-04-23 (×2): 0.5 mg via INTRAVENOUS

## 2019-04-23 MED ORDER — DIPHENHYDRAMINE HCL 50 MG/ML IJ SOLN: 50.0000 mg | Freq: Once | INTRAMUSCULAR | Status: DC | PRN

## 2019-04-23 MED ORDER — FENTANYL CITRATE (PF) 100 MCG/2ML IJ SOLN
INTRAMUSCULAR | Status: AC
  Filled 2019-04-23: qty 2

## 2019-04-23 MED ORDER — HYDRALAZINE HCL 20 MG/ML IJ SOLN: 5.0000 mg | INTRAMUSCULAR | Status: DC | PRN

## 2019-04-23 MED ORDER — NITROGLYCERIN 1 MG/10 ML FOR IR/CATH LAB
INTRA_ARTERIAL | Status: DC | PRN
  Administered 2019-04-23: 200 ug

## 2019-04-23 SURGICAL SUPPLY — 20 items
BALLN LUTONIX 5X150X130 (BALLOONS) ×3
BALLN ULTRVRSE 2X150X150 (BALLOONS) ×2
BALLN ULTRVRSE 2X150X150 OTW (BALLOONS) ×1
BALLN ULTRVRSE 3X300X150 (BALLOONS) ×2
BALLN ULTRVRSE 3X300X150 OTW (BALLOONS) ×1
BALLOON LUTONIX 5X150X130 (BALLOONS) ×1 IMPLANT
BALLOON ULTRVRSE 2X150X150 OTW (BALLOONS) ×1 IMPLANT
BALLOON ULTRVRSE 3X300X150 OTW (BALLOONS) ×1 IMPLANT
CATH BEACON 5 .038 100 VERT TP (CATHETERS) ×3 IMPLANT
CATH CXI SUPP ANG 4FR 135 (CATHETERS) ×1 IMPLANT
CATH CXI SUPP ANG 4FR 135CM (CATHETERS) ×3
CATH PIG 70CM (CATHETERS) ×3 IMPLANT
DEVICE PRESTO INFLATION (MISCELLANEOUS) ×3 IMPLANT
DEVICE STARCLOSE SE CLOSURE (Vascular Products) ×3 IMPLANT
GLIDEWIRE ADV .035X260CM (WIRE) ×3 IMPLANT
PACK ANGIOGRAPHY (CUSTOM PROCEDURE TRAY) ×3 IMPLANT
SHEATH ANL2 6FRX45 HC (SHEATH) ×3 IMPLANT
SHEATH BRITE TIP 5FRX11 (SHEATH) ×3 IMPLANT
WIRE G V18X300CM (WIRE) ×3 IMPLANT
WIRE J 3MM .035X145CM (WIRE) ×3 IMPLANT

## 2019-04-23 NOTE — Discharge Instructions (Signed)

## 2019-04-23 NOTE — H&P (Signed)
[facility] History & Physical Update  The patient was interviewed and re-examined.  The patient's previous History and Physical has been reviewed and is unchanged.  There is no change in the plan of care. We plan to proceed with the scheduled procedure.  Leotis Pain, MD  04/23/2019, 8:08 AM

## 2019-04-23 NOTE — Op Note (Signed)
[facility]  Percutaneous Study/Intervention Procedural Note   Date of Surgery: 04/23/2019  Surgeon(s):Jasey Cortez    Assistants:none  Pre-operative Diagnosis: PAD with rest pain left foot, ulceration right foot  Post-operative diagnosis:  Same  Procedure(s) Performed:             1.  Ultrasound guidance for vascular access right femoral artery             2.  Catheter placement into left common femoral artery from right femoral approach             3.  Selective left lower extremity angiogram             4.  Percutaneous transluminal angioplasty of the peroneal artery with a 2 mm diameter angioplasty balloon in the distal segment             5.   Percutaneous transluminal angioplasty of the left tibioperoneal trunk and proximal peroneal artery with a 3 mm diameter angioplasty balloon  6.  Percutaneous transluminal angioplasty of the left distal SFA and proximal popliteal artery with 5 mm diameter by 15 cm length Lutonix drug-coated angioplasty balloon             7.  StarClose closure device right femoral artery  EBL: 3 cc  Contrast: 80 cc  Fluoro Time: 10.1 minutes  Moderate Conscious Sedation Time: approximately 40 minutes using 1 mg of Versed and 50 mcg of Fentanyl              Indications:  Patient is a 86 y.o.female with severe peripheral arterial disease having already undergone multiple right lower extremity revascularizations for limb salvage. The patient has noninvasive study showing extremely poor flow and flat digital tracings on the left. The patient is brought in for angiography for further evaluation and potential treatment.  Due to the limb threatening nature of the situation, angiogram was performed for attempted limb salvage. The patient is aware that if the procedure fails, amputation would be expected.  The patient also understands that even with successful revascularization, amputation may still be required due to the severity of the  situation.  Risks and benefits are discussed and informed consent is obtained.   Procedure:  The patient was identified and appropriate procedural time out was performed.  The patient was then placed supine on the table and prepped and draped in the usual sterile fashion. Moderate conscious sedation was administered during a face to face encounter with the patient throughout the procedure with my supervision of the RN administering medicines and monitoring the patient's vital signs, pulse oximetry, telemetry and mental status throughout from the start of the procedure until the patient was taken to the recovery room. Ultrasound was used to evaluate the right common femoral artery.  It was patent .  A digital ultrasound image was acquired.  A Seldinger needle was used to access the right common femoral artery under direct ultrasound guidance and a permanent image was performed.  A 0.035 J wire was advanced without resistance and a 5Fr sheath was placed.   Aortogram was not required as we have recently had one showing no aortoiliac disease. I then crossed the aortic bifurcation and advanced to the left femoral head. Selective left lower extremity angiogram was then performed. This demonstrated normal common femoral artery, profunda femoris artery, and proximal superficial femoral artery.  There was some mild areas of stenosis in the proximal and mid SFA but the distal SFA and above-knee popliteal  artery had a high-grade stenosis in the 80 to 90% range.  There was another 90% stenosis in the tibioperoneal trunk.  The peroneal artery was then the best runoff distally is a larger vessel but it had occlusion in the distal segment although collaterals did fill into the foot.  The posterior tibial artery had a small vessel with a relatively long occlusion although there was reconstitution in the foot as well through peroneal collaterals.  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  It was felt that it was in the patient's best interest to proceed with intervention after these images to avoid a second procedure and a larger amount of contrast and fluoroscopy based off of the findings from the initial angiogram. The patient was systemically heparinized and a 6 [place] Ansell sheath was then placed over the [employer] wire. I then used a Kumpe catheter and the advantage wire to navigate through the SFA and popliteal lesions without difficulty.  Once we were down in the below-knee popliteal artery, we exchanged for a V 18 wire and a CXI cath and were able to easily cross the tibioperoneal trunk and proximal peroneal artery stenosis.  The wire then easily navigated through the occlusion of the distal peroneal artery and was parked in the lateral tarsal artery in the foot.  For the distal peroneal artery, a 2 mm diameter by 15 cm length angioplasty balloon was inflated to 12 atm for 1 minute.  For the tibioperoneal trunk and more proximal peroneal artery a 3 mm diameter angioplasty balloon was inflated to 6 atm for 1 minute.  The SFA and popliteal lesion was then treated with a 5 mm diameter by 15 cm length Lutonix drug-coated angioplasty balloon inflated to 10 atm for 1 minute.  Completion imaging showed the SFA and popliteal lesion to have only about a 15 to 20% residual stenosis.  The tibioperoneal trunk had about a 15 to 20% residual stenosis as well.  The peroneal artery was then widely patent down to the distal segment where significant spasm was present.  After giving intra-arterial nitroglycerin, there was flow down into the foot with some residual stenosis in the 30 to 40% range although significantly improved.  There is also bladder collateral flow into branches feeding the posterior tibial artery.  At this point, her perfusion has been significantly improved. I elected to terminate the procedure. The sheath was removed and StarClose closure device was deployed in the right femoral  artery with excellent hemostatic result. The patient was taken to the recovery room in stable condition having tolerated the procedure well.  Findings:                     Left lower Extremity:  Normal common femoral artery, profunda femoris artery, and proximal superficial femoral artery.  There was some mild areas of stenosis in the proximal and mid SFA but the distal SFA and above-knee popliteal artery had a high-grade stenosis in the 80 to 90% range.  There was another 90% stenosis in the tibioperoneal trunk.  The peroneal artery was then the best runoff distally is a larger vessel but it had occlusion in the distal segment although collaterals did fill into the foot.  The posterior tibial artery had a small vessel with a relatively long occlusion although there was reconstitution in the foot as well through peroneal collaterals.  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   Disposition: Patient was taken  to the recovery room in stable condition having tolerated the procedure well.  Complications: None  Leotis Pain 04/23/2019 9:18 AM   This note was created with Dragon Medical transcription system. Any errors in dictation are purely unintentional.

## 2019-04-27 ENCOUNTER — Encounter: Payer: Medicare Other | Admitting: Physician Assistant

## 2019-04-27 ENCOUNTER — Other Ambulatory Visit: Payer: Self-pay

## 2019-04-27 DIAGNOSIS — L97821 Non-pressure chronic ulcer of other part of left lower leg limited to breakdown of skin: Secondary | ICD-10-CM | POA: Diagnosis not present

## 2019-04-27 NOTE — Progress Notes (Signed)
Sandy Roberson, Sandy Roberson ([id number]) Visit Report for 04/27/2019 Arrival Information Details Patient Name: Sandy Roberson, Sandy Roberson Date of Service: 04/27/2019 8:45 AM Medical Record Number: [id number] Patient Account Number: [id number] Date of Birth/Sex: 1915/12/11 (86 y.o. F) Treating RN: Cornell Barman Primary Care Artemisa Sladek: Tracie Harrier Other Clinician: Referring Nai Borromeo: Tracie Harrier Treating Nevin Grizzle/Extender: Melburn Hake, HOYT Weeks in Treatment: 4 Visit Information History Since Last Visit Added or deleted any medications: Yes Patient Arrived: Wheel Chair Any new allergies or adverse reactions: No Arrival Time: 09:03 Had a fall or experienced change in No Accompanied By: son activities of daily living that may affect Transfer Assistance: Manual risk of falls: Patient Identification Verified: Yes Signs or symptoms of abuse/neglect since last visito No Secondary Verification Process Yes Hospitalized since last visit: No Completed: Implantable device outside of the clinic excluding No Patient Has Alerts: Yes cellular tissue based products placed in the center Patient Alerts: Patient on Blood since last visit: Thinner Has Dressing in Place as Prescribed: Yes aspirin 81 Pain Present Now: No 03/10/19 ABI L .77 R .67 TBI L .24 R .16 Electronic Signature(s) Signed: 04/27/2019 5:00:56 PM By: Gretta Cool, BSN, RN, CWS, Kim RN, BSN Entered By: Gretta Cool, BSN, RN, CWS, Kim on 04/27/2019 09:04:20 Sandy Roberson ([id number]) -------------------------------------------------------------------------------- Clinic Level of Care Assessment Details Patient Name: Sandy Roberson Date of Service: 04/27/2019 8:45 AM Medical Record Number: [id number] Patient Account Number: [id number] Date of Birth/Sex: 1916-03-09 (86 y.o. F) Treating RN: Army Melia Primary Care Kiondra Caicedo: Tracie Harrier Other Clinician: Referring Onie Kasparek: Tracie Harrier Treating Jillian Warth/Extender: Melburn Hake,  HOYT Weeks in Treatment: 4 Clinic Level of Care Assessment Items TOOL 4 Quantity Score []  - Use when only an EandM is performed on FOLLOW-UP visit 0 ASSESSMENTS - Nursing Assessment / Reassessment X - Reassessment of Co-morbidities (includes updates in patient status) 1 10 X- 1 5 Reassessment of Adherence to Treatment Plan ASSESSMENTS - Wound and Skin Assessment / Reassessment []  - Simple Wound Assessment / Reassessment - one wound 0 X- 4 5 Complex Wound Assessment / Reassessment - multiple wounds []  - 0 Dermatologic / Skin Assessment (not related to wound area) ASSESSMENTS - Focused Assessment X - Circumferential Edema Measurements - multi extremities 1 5 []  - 0 Nutritional Assessment / Counseling / Intervention X- 1 5 Lower Extremity Assessment (monofilament, tuning fork, pulses) []  - 0 Peripheral Arterial Disease Assessment (using hand held doppler) ASSESSMENTS - Ostomy and/or Continence Assessment and Care []  - Incontinence Assessment and Management 0 []  - 0 Ostomy Care Assessment and Management (repouching, etc.) PROCESS - Coordination of Care X - Simple Patient / Family Education for ongoing care 1 15 []  - 0 Complex (extensive) Patient / Family Education for ongoing care X- 1 10 Staff obtains Programmer, systems, Records, Test Results / Process Orders []  - 0 Staff telephones HHA, Nursing Homes / Clarify orders / etc []  - 0 Routine Transfer to another Facility (non-emergent condition) []  - 0 Routine Hospital Admission (non-emergent condition) []  - 0 New Admissions / Biomedical engineer / Ordering NPWT, Apligraf, etc. []  - 0 Emergency Hospital Admission (emergent condition) X- 1 10 Simple Discharge Coordination Sandy Roberson, Sandy Roberson ([id number]) []  - 0 Complex (extensive) Discharge Coordination PROCESS - Special Needs []  - Pediatric / Minor Patient Management 0 []  - 0 Isolation Patient Management []  - 0 Hearing / Language / Visual special needs []  - 0 Assessment of  Community assistance (transportation, D/C planning, etc.) []  - 0 Additional assistance / Altered mentation []  - 0 Support Surface(s) Assessment (bed, cushion, seat, etc.) INTERVENTIONS - Wound  Cleansing / Measurement []  - Simple Wound Cleansing - one wound 0 X- 4 5 Complex Wound Cleansing - multiple wounds X- 1 5 Wound Imaging (photographs - any number of wounds) []  - 0 Wound Tracing (instead of photographs) []  - 0 Simple Wound Measurement - one wound X- 4 5 Complex Wound Measurement - multiple wounds INTERVENTIONS - Wound Dressings []  - Small Wound Dressing one or multiple wounds 0 X- 4 15 Medium Wound Dressing one or multiple wounds []  - 0 Large Wound Dressing one or multiple wounds []  - 0 Application of Medications - topical []  - 0 Application of Medications - injection INTERVENTIONS - Miscellaneous []  - External ear exam 0 []  - 0 Specimen Collection (cultures, biopsies, blood, body fluids, etc.) []  - 0 Specimen(s) / Culture(s) sent or taken to Lab for analysis []  - 0 Patient Transfer (multiple staff / Civil Service fast streamer / Similar devices) []  - 0 Simple Staple / Suture removal (25 or less) []  - 0 Complex Staple / Suture removal (26 or more) []  - 0 Hypo / Hyperglycemic Management (close monitor of Blood Glucose) []  - 0 Ankle / Brachial Index (ABI) - do not check if billed separately X- 1 5 Vital Signs Sandy Roberson, Sandy Roberson ([id number]) Has the patient been seen at the hospital within the last three years: Yes Total Score: 190 Level Of Care: New/Established - Level 5 Electronic Signature(s) Signed: 04/27/2019 3:07:10 PM By: Army Melia Entered By: Army Melia on 04/27/2019 09:31:01 Sandy Roberson ([id number]) -------------------------------------------------------------------------------- Encounter Discharge Information Details Patient Name: Sandy Roberson Date of Service: 04/27/2019 8:45 AM Medical Record Number: [id number] Patient Account Number:  [id number] Date of Birth/Sex: 01/13/1916 (86 y.o. F) Treating RN: Army Melia Primary Care Ryder Chesmore: Tracie Harrier Other Clinician: Referring Miyu Fenderson: Tracie Harrier Treating Narcissus Detwiler/Extender: Melburn Hake, HOYT Weeks in Treatment: 4 Encounter Discharge Information Items Discharge Condition: Stable Ambulatory Status: Wheelchair Discharge Destination: Home Transportation: Private Auto Accompanied By: son Schedule Follow-up Appointment: Yes Clinical Summary of Care: Electronic Signature(s) Signed: 04/27/2019 3:07:10 PM By: Army Melia Entered By: Army Melia on 04/27/2019 09:32:28 Sandy Roberson ([id number]) -------------------------------------------------------------------------------- Lower Extremity Assessment Details Patient Name: Sandy Roberson Date of Service: 04/27/2019 8:45 AM Medical Record Number: [id number] Patient Account Number: [id number] Date of Birth/Sex: 1916-04-16 (86 y.o. F) Treating RN: Army Melia Primary Care Santi Troung: Tracie Harrier Other Clinician: Referring Shayanne Gomm: Tracie Harrier Treating Delford Wingert/Extender: STONE III, HOYT Weeks in Treatment: 4 Edema Assessment Assessed: [Left: No] [Right: No] Edema: [Left: No] [Right: No] Vascular Assessment Pulses: Dorsalis Pedis Palpable: [Left:Yes] [Right:Yes] Electronic Signature(s) Signed: 04/27/2019 3:07:10 PM By: Army Melia Entered By: Army Melia on 04/27/2019 09:19:32 Sandy Roberson ([id number]) -------------------------------------------------------------------------------- Multi Wound Chart Details Patient Name: Sandy Roberson Date of Service: 04/27/2019 8:45 AM Medical Record Number: [id number] Patient Account Number: [id number] Date of Birth/Sex: [birth year] (86 y.o. 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 Leg Ulcer Comorbid History: Anemia, Congestive Heart Anemia, Congestive Heart Anemia, Congestive Heart Failure, Coronary Artery Failure, Coronary Artery Failure, Coronary Artery Disease, Hypertension, Disease, Hypertension, Disease, Hypertension, Peripheral Arterial Disease, Peripheral Arterial Disease, Peripheral Arterial Disease, Peripheral Venous Disease, Peripheral Venous Disease,  Peripheral Venous Disease, End Stage Renal Disease End Stage Renal Disease End Stage Renal Disease Date Acquired: 03/16/2018 04/18/2019 04/18/2019 Weeks of Treatment: 4 1 1  Wound Status: Open Open Open Pending Amputation on Yes No No Presentation: Measurements L x W x D 4x2.9x1.5 4.5x5.6x0.1 1x1x0.2 (cm) Area (cm) : 9.111 19.792 0.785 Volume (cm) : 13.666 1.979 0.157 % Reduction in Area: 14.70% -96.90% 16.70% % Reduction in Volume: 36.00% -96.90% -67.00% Classification: Full Thickness With Exposed Partial Thickness Partial Thickness Support Structures Exudate Amount: Medium Large Large Exudate Type: Serous Serous Serous Exudate Color: amber amber amber Foul Odor After Cleansing: Yes No No Odor Anticipated Due to No N/A N/A Product Use: SKYLEEN, WATES ([id number]) Wound Margin: Flat and Intact Flat and Intact Flat and Intact Granulation Amount: Small (1-33%) Large (67-100%) Medium (34-66%) Granulation Quality: Pink Pink Pink Necrotic Amount: Large (67-100%) Small (1-33%) Medium (34-66%) Necrotic Tissue: Eschar, Adherent Slough N/A N/A Exposed Structures: Fat Layer (Subcutaneous Fat Layer (Subcutaneous Fat  Layer (Subcutaneous Tissue) Exposed: Yes Tissue) Exposed: Yes Tissue) Exposed: Yes Muscle: Yes Fascia: No Fascia: No Bone: Yes Tendon: No Tendon: No Fascia: No Muscle: No Muscle: No Tendon: No Joint: No Joint: No Joint: No Bone: No Bone: No Epithelialization: None Medium (34-66%) Small (1-33%) Wound Number: 4 N/A N/A Photos: N/A N/A Wound Location: Right Gluteus N/A N/A Wounding Event: Pressure Injury N/A N/A Primary Etiology: Pressure Ulcer N/A N/A Comorbid History: Anemia, Congestive Heart N/A N/A Failure, Coronary Artery Disease, Hypertension, Peripheral Arterial Disease, Peripheral Venous Disease, End Stage Renal Disease Date Acquired: 10/15/2018 N/A N/A Weeks of Treatment: 1 N/A N/A Wound Status: Open N/A N/A Pending Amputation on No N/A N/A Presentation: Measurements L x W x D 3.8x1.8x0.1 N/A N/A (cm) Area (cm) : 5.372 N/A N/A Volume (cm) : 0.537 N/A N/A % Reduction in Area: 18.60% N/A N/A % Reduction in Volume: 18.60% N/A N/A Classification: Category/Stage II N/A N/A Exudate Amount: Medium N/A N/A Exudate Type: Serous N/A N/A Exudate Color: amber N/A N/A Foul Odor After Cleansing: No N/A N/A Odor Anticipated Due to N/A N/A N/A Product Use: Wound Margin: Flat and Intact N/A N/A Granulation Amount: Small (1-33%) N/A N/A Granulation Quality: Pink N/A N/A Necrotic Amount: Medium (34-66%) N/A N/A Necrotic Tissue: Adherent Slough N/A N/A Exposed Structures: Fat Layer (Subcutaneous N/A N/A Tissue) Exposed: Yes Sandy Roberson, Sandy Roberson ([id number]) Fascia: No Tendon: No Muscle: No Joint: No Bone: No Epithelialization: None N/A N/A Treatment Notes Electronic Signature(s) Signed: 04/27/2019 3:07:10 PM By: Army Melia Entered By: Army Melia on 04/27/2019 09:22:29 Sandy Roberson ([id number]) -------------------------------------------------------------------------------- Multi-Disciplinary Care Plan Details Patient Name: Sandy Roberson Date of  Service: 04/27/2019 8:45 AM Medical Record Number: [id number] Patient Account Number: [id number] Date of Birth/Sex: [birth year] (86 y.o. F) Treating RN: Army Melia Primary Care Lakishia Bourassa: Tracie Harrier Other Clinician: Referring Amarissa Koerner: Tracie Harrier Treating Arie Powell/Extender: Melburn Hake, HOYT Weeks in Treatment: 4 Active Inactive Venous Leg Ulcer Nursing Diagnoses: Knowledge deficit related to disease process and management Goals: Patient/caregiver will verbalize understanding of disease process and disease management Date Initiated: 03/30/2019 Target Resolution Date: 03/30/2019 Goal Status: Active Interventions: Assess peripheral edema status every visit. Notes: Wound/Skin Impairment Nursing Diagnoses: Impaired tissue integrity Knowledge deficit related to ulceration/compromised skin integrity Goals: Ulcer/skin breakdown will have a volume reduction of 30% by week 4 Date Initiated: 03/30/2019 Target Resolution Date: 04/30/2019 Goal Status: Active Interventions: Assess patient/caregiver ability to obtain necessary supplies Assess patient/caregiver ability to perform ulcer/skin care regimen upon admission and as needed Assess ulceration(s) every visit Provide education on  ulcer and skin care Notes: Electronic Signature(s) Signed: 04/27/2019 3:07:10 PM By: Army Melia Entered By: Army Melia on 04/27/2019 09:22:16 Sandy Roberson ([id number]) -------------------------------------------------------------------------------- Non-Wound Condition Assessment Details Patient Name: Sandy Roberson Date of Service: 04/27/2019 8:45 AM Medical Record Number: [id number] Patient Account Number: [id number] Date of Birth/Sex: 01-Nov-1915 (86 y.o. F) Treating RN: Army Melia Primary Care Kentavius Dettore: Tracie Harrier Other Clinician: Referring Audi Wettstein: Tracie Harrier Treating Mahala Rommel/Extender: Melburn Hake, HOYT Weeks in Treatment: 4 Non-Wound Condition: Condition:  Other Dermatologic Condition Location: Foot Side: Left Notes Blister on left 3rd toe Electronic Signature(s) Signed: 04/27/2019 3:07:10 PM By: Army Melia Entered By: Army Melia on 04/27/2019 09:24:37 Sandy Roberson ([id number]) -------------------------------------------------------------------------------- Pain Assessment Details Patient Name: Sandy Roberson Date of Service: 04/27/2019 8:45 AM Medical Record Number: [id number] Patient Account Number: [id number] Date of Birth/Sex: February 06, 1916 (86 y.o. F) Treating RN: Cornell Barman Primary Care Darryle Dennie: Tracie Harrier Other Clinician: Referring Mouhamed Glassco: Tracie Harrier Treating Audrey Eller/Extender: Melburn Hake, HOYT Weeks in Treatment: 4 Active Problems Location of Pain Severity and Description of Pain Patient Has Paino No Site Locations Pain Management and Medication Current Pain Management: Electronic Signature(s) Signed: 04/27/2019 5:00:56 PM By: Gretta Cool, BSN, RN, CWS, Kim RN, BSN Entered By: Gretta Cool, BSN, RN, CWS, Kim on 04/27/2019 09:04:28 Sandy Roberson ([id number]) -------------------------------------------------------------------------------- Patient/Caregiver Education Details Patient Name: Sandy Roberson Date of Service: 04/27/2019 8:45 AM Medical Record Number: [id number] Patient Account Number: [id number] Date of Birth/Gender: 08-Nov-1915 (86 y.o. F) Treating RN: Army Melia Primary Care Physician: Tracie Harrier Other Clinician: Referring Physician: Tracie Harrier Treating Physician/Extender: Sharalyn Ink in Treatment: 4 Education Assessment Education Provided To: Patient Education Topics Provided Wound/Skin Impairment: Handouts: Caring for Your Ulcer Methods: Demonstration, Explain/Verbal Responses: State content correctly Electronic Signature(s) Signed: 04/27/2019 3:07:10 PM By: Army Melia Entered By: Army Melia on 04/27/2019 09:31:15 Sandy Roberson  ([id number]) -------------------------------------------------------------------------------- Wound Assessment Details Patient Name: Sandy Roberson Date of Service: 04/27/2019 8:45 AM Medical Record Number: [id number] Patient Account Number: [id number] Date of Birth/Sex: 01/19/1916 (86 y.o. F) Treating RN: Army Melia Primary Care Taichi Repka: Tracie Harrier Other Clinician: Referring Lekesha Claw: Tracie Harrier Treating Omare Bilotta/Extender: Melburn Hake, HOYT Weeks in Treatment: 4 Wound Status Wound Number: 1 Primary Arterial Insufficiency Ulcer Etiology: Wound Location: Right Metatarsal head first - Medial Wound Open Wounding Event: Gradually Appeared Status: Date Acquired: 03/16/2018 Comorbid Anemia, Congestive Heart Failure, Coronary Weeks Of Treatment: 4 History: Artery Disease, Hypertension, Peripheral Arterial Clustered Wound: No Disease, Peripheral Venous Disease, End Stage Pending Amputation On Presentation Renal Disease Photos Wound Measurements Length: (cm) 4 Width: (cm) 2.9 Depth: (cm) 1.5 Area: (cm) 9.111 Volume: (cm) 13.666 % Reduction in Area: 14.7% % Reduction in Volume: 36% Epithelialization: None Tunneling: No Undermining: No Wound Description Full Thickness With Exposed Support Classification: Structures Wound Margin: Flat and Intact Exudate Medium Amount: Exudate Type: Serous Exudate Color: amber Foul Odor After Cleansing: Yes Due to Product Use: No Slough/Fibrino Yes Wound Bed Granulation Amount: Small (1-33%) Exposed Structure Granulation Quality: Pink Fascia Exposed: No Necrotic Amount: Large (67-100%) Fat Layer (Subcutaneous Tissue) Exposed: Yes Necrotic Quality: Eschar, Adherent Slough Tendon Exposed: No Muscle Exposed: Yes Necrosis of Muscle: Yes Joint Exposed: No Sandy Roberson, Sandy Roberson ([id number]) Bone Exposed: Yes Treatment Notes Wound #1 (Right, Medial Metatarsal head first) Notes iodoflex, desitin, ABD, conform, to right  foot S. cell, ABD, conform, tubi F to left leg Prisma, BFD to sacrum Electronic Signature(s) Signed: 04/27/2019 3:07:10 PM By: Army Melia Entered By: Army Melia on 04/27/2019 09:17:35 Sandy Roberson, Sandy Roberson ([id number]) -------------------------------------------------------------------------------- Wound Assessment Details Patient Name: Sandy Roberson  Date of Service: 04/27/2019 8:45 AM Medical Record Number: [id number] Patient Account Number: [id number] Date of Birth/Sex: January 05, 1916 (86 y.o. F) Treating RN: Army Melia Primary Care Audianna Landgren: Tracie Harrier Other Clinician: Referring Harmonee Tozer: Tracie Harrier Treating Blessing Ozga/Extender: Melburn Hake, HOYT Weeks in Treatment: 4 Wound Status Wound Number: 2 Primary Venous Leg Ulcer Etiology: Wound Location: Left Lower Leg - Medial Wound Open Wounding Event: Blister Status: Date Acquired: 04/18/2019 Comorbid Anemia, Congestive Heart Failure, Coronary Weeks Of Treatment: 1 History: Artery Disease, Hypertension, Peripheral Arterial Clustered Wound: No Disease, Peripheral Venous Disease, End Stage Renal Disease Photos Wound Measurements Length: (cm) 4.5 % Reduction in Width: (cm) 5.6 % Reduction in Depth: (cm) 0.1 Epithelializat Area: (cm) 19.792 Tunneling: Volume: (cm) 1.979 Undermining: Area: -96.9% Volume: -96.9% ion: Medium (34-66%) No No Wound Description Classification: Partial Thickness Foul Odor Afte Wound Margin: Flat and Intact Slough/Fibrino Exudate Amount: Large Exudate Type: Serous Exudate Color: amber r Cleansing: No No Wound Bed Granulation Amount: Large (67-100%) Exposed Structure Granulation Quality: Pink Fascia Exposed: No Necrotic Amount: Small (1-33%) Fat Layer (Subcutaneous Tissue) Exposed: Yes Tendon Exposed: No Muscle Exposed: No Joint Exposed: No Bone Exposed: No Sandy Roberson, Sandy Roberson ([id number]) Treatment Notes Wound #2 (Left, Medial Lower Leg) Notes iodoflex, desitin, ABD,  conform, to right foot S. cell, ABD, conform, tubi F to left leg Prisma, BFD to sacrum Electronic Signature(s) Signed: 04/27/2019 3:07:10 PM By: Army Melia Entered By: Army Melia on 04/27/2019 09:18:14 Sandy Roberson ([id number]) -------------------------------------------------------------------------------- Wound Assessment Details Patient Name: Sandy Roberson Date of Service: 04/27/2019 8:45 AM Medical Record Number: [id number] Patient Account Number: [id number] Date of Birth/Sex: [birth year] (86 y.o. F) Treating RN: Army Melia Primary Care Elliannah Wayment: Tracie Harrier Other Clinician: Referring Andree Golphin: Tracie Harrier Treating Mileigh Tilley/Extender: Melburn Hake, HOYT Weeks in Treatment: 4 Wound Status Wound Number: 3 Primary Venous Leg Ulcer Etiology: Wound Location: Left Ankle - Medial Wound Open Wounding Event: Blister Status: Date Acquired: 04/18/2019 Comorbid Anemia, Congestive Heart Failure, Coronary Weeks Of Treatment: 1 History: Artery Disease, Hypertension, Peripheral Arterial Clustered Wound: No Disease, Peripheral Venous Disease, End Stage Renal Disease Photos Wound Measurements Length: (cm) 1 % Reduction in Width: (cm) 1 % Reduction in Depth: (cm) 0.2 Epithelializat Area: (cm) 0.785 Tunneling: Volume: (cm) 0.157 Undermining: Area: 16.7% Volume: -67% ion: Small (1-33%) No No Wound Description Classification: Partial Thickness Foul Odor Afte Wound Margin: Flat and Intact Slough/Fibrino Exudate Amount: Large Exudate Type: Serous Exudate Color: amber r Cleansing: No No Wound Bed Granulation Amount: Medium (34-66%) Exposed Structure Granulation Quality: Pink Fascia Exposed: No Necrotic Amount: Medium (34-66%) Fat Layer (Subcutaneous Tissue) Exposed: Yes Tendon Exposed: No Muscle Exposed: No Joint Exposed: No Bone Exposed: No Sandy Roberson, Sandy Roberson ([id number]) Treatment Notes Wound #3 (Left, Medial Ankle) Notes iodoflex, desitin,  ABD, conform, to right foot S. cell, ABD, conform, tubi F to left leg Prisma, BFD to sacrum Electronic Signature(s) Signed: 04/27/2019 3:07:10 PM By: Army Melia Entered By: Army Melia on 04/27/2019 09:18:42 Sandy Roberson ([id number]) -------------------------------------------------------------------------------- Wound Assessment Details Patient Name: Sandy Roberson Date of Service: 04/27/2019 8:45 AM Medical Record Number: [id number] Patient Account Number: [id number] Date of Birth/Sex: [birth year] (86 y.o. F) Treating RN: Army Melia Primary Care Marlaya Turck: Tracie Harrier Other Clinician: Referring Loyd Salvador: Tracie Harrier Treating Joli Koob/Extender: Melburn Hake, HOYT Weeks in Treatment: 4 Wound Status Wound Number: 4 Primary Pressure Ulcer Etiology: Wound Location: Right Gluteus Wound Open Wounding Event: Pressure Injury Status: Date Acquired: 10/15/2018 Comorbid Anemia, Congestive Heart Failure, Coronary Weeks Of Treatment: 1 History: Artery Disease, Hypertension, Peripheral Arterial Clustered Wound: No Disease,  Peripheral Venous Disease, End Stage Renal Disease Photos Wound Measurements Length: (cm) 3.8 % Reduction in Width: (cm) 1.8 % Reduction in Depth: (cm) 0.1 Epithelializati Area: (cm) 5.372 Volume: (cm) 0.537 Area: 18.6% Volume: 18.6% on: None Wound Description Classification: Category/Stage II Foul Odor After Wound Margin: Flat and Intact Slough/Fibrino Exudate Amount: Medium Exudate Type: Serous Exudate Color: amber Cleansing: No Yes Wound Bed Granulation Amount: Small (1-33%) Exposed Structure Granulation Quality: Pink Fascia Exposed: No Necrotic Amount: Medium (34-66%) Fat Layer (Subcutaneous Tissue) Exposed: Yes Necrotic Quality: Adherent Slough Tendon Exposed: No Muscle Exposed: No Joint Exposed: No Bone Exposed: No Sandy Roberson, Sandy Roberson ([id number]) Treatment Notes Wound #4 (Right Gluteus) Notes iodoflex, desitin, ABD,  conform, to right foot S. cell, ABD, conform, tubi F to left leg Prisma, BFD to sacrum Electronic Signature(s) Signed: 04/27/2019 3:07:10 PM By: Army Melia Entered By: Army Melia on 04/27/2019 09:19:17 Sandy Roberson ([id number]) -------------------------------------------------------------------------------- Vitals Details Patient Name: Sandy Roberson Date of Service: 04/27/2019 8:45 AM Medical Record Number: [id number] Patient Account Number: [id number] Date of Birth/Sex: 28-Feb-1916 (86 y.o. F) Treating RN: Cornell Barman Primary Care Rayvon Brandvold: Tracie Harrier Other Clinician: Referring Doristine Shehan: Tracie Harrier Treating Greg Cratty/Extender: Melburn Hake, HOYT Weeks in Treatment: 4 Vital Signs Time Taken: 09:04 Temperature (F): 96.8 Height (in): 63 Pulse (bpm): 79 Weight (lbs): 118 Respiratory Rate (breaths/min): 16 Body Mass Index (BMI): 20.9 Blood Pressure (mmHg): 120/56 Reference Range: 80 - 120 mg / dl Electronic Signature(s) Signed: 04/27/2019 5:00:56 PM By: Gretta Cool, BSN, RN, CWS, Kim RN, BSN Entered By: Gretta Cool, BSN, RN, CWS, Kim on 04/27/2019 09:04:52

## 2019-04-27 NOTE — Progress Notes (Addendum)
Sandy Roberson ([id number]) Visit Report for 04/27/2019 Chief Complaint Document Details Patient Name: Sandy Roberson, Sandy Roberson Date of Service: 04/27/2019 8:45 AM Medical Record Number: [id number] Patient Account Number: [id number] Date of Birth/Sex: December 05, 1915 (86 y.o. F) Treating RN: Cornell Barman Primary Care Provider: Tracie Harrier Other Clinician: Referring Provider: Tracie Harrier Treating Provider/Extender: Melburn Hake, Aeric Burnham Weeks in Treatment: 4 Information Obtained from: Patient Chief Complaint Right medial foot surgical ulcer, Left LE ulcers, and right gluteal ulcer Electronic Signature(s) Signed: 04/27/2019 9:07:49 AM By: Worthy Keeler PA-C Entered By: Worthy Keeler on 04/27/2019 09:07:48 Sandy Roberson ([id number]) -------------------------------------------------------------------------------- HPI Details Patient Name: Sandy Roberson Date of Service: 04/27/2019 8:45 AM Medical Record Number: [id number] Patient Account Number: [id number] Date of Birth/Sex: [birth year] (86 y.o. F) Treating RN: Cornell Barman Primary Care Provider: Tracie Harrier Other Clinician: Referring Provider: Tracie Harrier Treating Provider/Extender: Melburn Hake, Chandlor Noecker Weeks in Treatment: 4 History of Present Illness HPI Description: 03/30/2019 on evaluation today patient presents for initial evaluation here in our clinic as a referral from Dr. Selina Cooley office who is a local podiatrist. Unfortunately the patient has experienced significant issues with decreased ability to heal in regard to a surgical site and again this is a fairly complicated history. The patient has undergone 3 vascular interventions the most recent of which was actually performed on 12//20. This was subsequent to the patient having ABIs which were somewhat poor registering at 0.67 on the right with a TBI of 0.16 and on the left she was 0.77 with a TBI of 0.24. Subsequently the patient had had surgery to actually  remove a portion of the first metatarsal on her right foot. They did not remove the toe although again from the patient's standpoint I am not 100% sure exactly the reasoning behind what was done or was not done but again I think a big part of the issue was trying to be as minimally invasive for the patient as possible do #1 to her arterial status #2 due to her age. Nonetheless unfortunately she has had some trouble with healing and when the sutures were removed the wound apparently dehisced. The patient does currently have more recent vascular intervention which again was on December 4 with Dr. Lazaro Arms and at this point the patient did have angioplasty which upon completion showed inline flow into the foot with less than 20% residual stenosis after angioplasties. There were multiple sites which had to be worked on during the procedure by Dr. Lucky Cowboy. Again the patient has very problematic blood flow with regard to her arterial status at this point. She does have a history of dysphagia, stage III kidney disease, and congestive heart failure as well. 04/06/2019 on evaluation today patient appears to be doing in my opinion about the same there may be slightly better with regard to the overall. With that being said she still is healing slowly obviously has only been 1 week and now the wounds do appear to be clean enough I still think there is can be quite a bit of work to do going forward before will be where we really need to be here. She does have an appointment with vascular on Wednesday which is just 2 days away for repeat vascular studies along with evaluation with the physician as well. This will be good information for [place] to know going forward as far as her arterial status and what we can or cannot do from the standpoint of debridement or otherwise. 12/28-Patient returns at 1 week with the right medial metatarsal wound,  this appears to be better with the rim of granulation tissue noted. We have been using  Iodoflex. Patient had vascular studies done that indicate good flow to this foot, the left foot has ABI of 0.59 and a TBI 0.34 and patient has vascular follow-up for revascularization efforts. 04/20/2019 on evaluation today patient's right first metatarsal ulcer actually appears to be doing about the same is making slow progress. With that being said she unfortunately has blisters over the left lower extremity, left medial ankle, and she still has apparently a pressure ulcer in the gluteal region which is in the right gluteus and is completely new to [place]. This is something that was not known of previous. In fact they did not even tell us about it today until at the end of the visit when we were getting ready to go and her son mention this. 04/27/2019 upon evaluation today patient appears to be doing excellent with regard to her wounds all things considered. I do feel like she is making some progress here she did have her vascular intervention/angiogram which was on 04/23/2019. With that being said I do not have the records for review today as the note is not actually in yet with looking in the system. Nonetheless Dr. Lucky Cowboy did perform this procedure. The patient has had some increased swelling since the procedure in the left lower extremity but this is to be expected. Obviously that will cause the wounds to drain more. Nonetheless overall I do not see any signs of active infection. Her right foot ulcer seems to be making progress albeit slow. Electronic Signature(s) Signed: 04/27/2019 10:54:39 AM By: Worthy Keeler PA-C Entered By: Worthy Keeler on 04/27/2019 10:54:39 Sandy Roberson ([id number]) -------------------------------------------------------------------------------- Physical Exam Details Patient Name: Sandy Roberson Date of Service: 04/27/2019 8:45 AM Medical Record Number: [id number] Patient Account Number: [id number] Date of Birth/Sex: [birth year] (86 y.o. F) Treating RN: Cornell Barman Primary Care Provider: Tracie Harrier Other Clinician: Referring Provider: Tracie Harrier Treating Provider/Extender: STONE III, Vedanshi Massaro Weeks in Treatment: 4 Constitutional Well-nourished and well-hydrated in no acute distress. Respiratory normal breathing without difficulty. Psychiatric this patient is able to make decisions and demonstrates good insight into disease process. Alert and Oriented x 3. pleasant and cooperative. Notes Patient's wounds currently all showed signs of improvement based on what I was seeing today. Fortunately there is no evidence of active infection which is great news and the patient seems to be tolerating the dressing changes without complication. No fevers, chills, nausea, vomiting, or diarrhea. Overall I think that were going to continue to manage her as we have been doing at all wound locations I feel like that is helping the only thing I will add likely will be Betadine to her toe area to help this dry out more significantly. Electronic Signature(s) Signed: 04/27/2019 10:55:23 AM By: Worthy Keeler PA-C Entered By: Worthy Keeler on 04/27/2019 10:55:23 Sandy Roberson ([id number]) -------------------------------------------------------------------------------- Physician Orders Details Patient Name: Sandy Roberson Date of Service: 04/27/2019 8:45 AM Medical Record Number: [id number] Patient Account Number: [id number] Date of Birth/Sex: 01-29-1916 (86 y.o. F) Treating RN: Army Melia Primary Care Provider: Tracie Harrier Other Clinician: Referring Provider: Tracie Harrier Treating Provider/Extender: Melburn Hake, Corie Allis Weeks in Treatment: 4 Verbal / Phone Orders: No Diagnosis Coding ICD-10 Coding Code Description I73.89 Other specified peripheral vascular diseases T81.31XA Disruption of external operation (surgical) wound, not elsewhere classified, initial encounter L97.513 Non-pressure chronic ulcer of other part of right foot  with necrosis of muscle L89.312 Pressure ulcer  of right buttock, stage 2 L97.821 Non-pressure chronic ulcer of other part of left lower leg limited to breakdown of skin R13.12 Dysphagia, oropharyngeal phase N18.30 Chronic kidney disease, stage 3 unspecified I50.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 (congestive) heart failure Wound Cleansing Wound #1 Right,Medial Metatarsal head first o Dial antibacterial soap, wash wounds, rinse and pat dry prior to dressing wounds Wound #2 Left,Medial Lower Leg o Dial antibacterial soap, wash wounds, rinse and pat dry prior to dressing wounds Wound #3 Left,Medial Ankle o Dial antibacterial soap, wash wounds, rinse and pat dry prior to dressing wounds Wound #4 Right Gluteus o Dial antibacterial soap, wash wounds, rinse and pat dry prior to dressing wounds Skin Barriers/Peri-Wound Care o Other: - betadine to left 3rd toe Wound #1 Right,Medial Metatarsal head first o Barrier cream Primary Wound Dressing Wound #1 Right,Medial Metatarsal head first o Iodoflex Wound #2 Left,Medial Lower Leg o Silver Alginate Wound #3 Left,Medial Ankle o Silver Alginate Wound #4 Right Gluteus o Silver Collagen Sandy Roberson, Sandy Roberson ([id number]) Secondary Dressing Wound #1 Right,Medial Metatarsal head first o ABD and Kerlix/Conform - Conform o Conform/Kerlix - conform to secure o Drawtex - Place Drawtex right behind iodoflex Wound #2 Left,Medial Lower Leg o Conform/Kerlix - conform to secure Wound #3 Left,Medial Ankle o Conform/Kerlix - conform to secure Wound #4 Right Gluteus o Boardered Foam Dressing Dressing Change Frequency Wound #1 Right,Medial Metatarsal head first o Change Dressing Monday, Wednesday, Friday Wound #2 Left,Medial Lower Leg o Change Dressing Monday, Wednesday, Friday Wound #3 Left,Medial Ankle o Change Dressing Monday, Wednesday, Friday Wound #4 Right Gluteus o Change Dressing  Monday, Wednesday, Friday Follow-up Appointments Wound #1 Right,Medial Metatarsal head first o Return Appointment in 1 week. Wound #2 Left,Medial Lower Leg o Return Appointment in 1 week. Wound #3 Left,Medial Ankle o Return Appointment in 1 week. Wound #4 Right Gluteus o Return Appointment in 1 week. Edema Control Wound #2 Left,Medial Lower Leg o Other: - Tubi Grip single layer Wound #3 Left,Medial Ankle o Other: - Tubi Grip single layer Home Health Wound #1 Right,Medial Metatarsal head first o [place] for [place] to change Wednesdays and Fridays o Continue Home Health Visits o Home Health Nurse may visit PRN to address patientos wound care needs. o FACE TO FACE ENCOUNTER: MEDICARE and MEDICAID PATIENTS: I certify that this patient is under my care and that I had a face-to-face encounter that meets the physician face-to-face encounter requirements with this NECHOLE, LISON ([id number]) patient on this date. The encounter with the patient was in whole or in part for the following MEDICAL CONDITION: (primary reason for [place]) MEDICAL NECESSITY: I certify, that based on my findings, NURSING services are a medically necessary home health service. HOME BOUND STATUS: I certify that my clinical findings support that this patient is homebound (i.e., Due to illness or injury, pt requires aid of supportive devices such as crutches, cane, wheelchairs, walkers, the use of special transportation or the assistance of another person to leave their place of residence. There is a normal inability to leave the home and doing so requires considerable and taxing effort. Other absences are for medical reasons / religious services and are infrequent or of short duration when for other reasons). o If current dressing causes regression in wound condition, may D/C ordered dressing product/s and apply Normal Saline Moist Dressing  daily until next [place] / Other MD appointment. [place] of regression in wound condition at [phone].   o Please direct any NON-WOUND related issues/requests for orders to patient's Primary Care Physician Wound #2 [place] for [place] to change Wednesdays and Fridays o Continue Home Health Visits o Home Health Nurse may visit PRN to address patientos wound care needs. o FACE TO FACE ENCOUNTER: MEDICARE and MEDICAID PATIENTS: I certify that this patient is under my care and that I had a face-to-face encounter that meets the physician face-to-face encounter requirements with this patient on this date. The encounter with the patient was in whole or in part for the following MEDICAL CONDITION: (primary reason for [place]) MEDICAL NECESSITY: I certify, that based on my findings, NURSING services are a medically necessary home health service. HOME BOUND STATUS: I certify that my clinical findings support that this patient is homebound (i.e., Due to illness or injury, pt requires aid of supportive devices such as crutches, cane, wheelchairs, walkers, the use of special transportation or the assistance of another person to leave their place of residence. There is a normal inability to leave the home and doing so requires considerable and taxing effort. Other absences are for medical reasons / religious services and are infrequent or of short duration when for other reasons). o If current dressing causes regression in wound condition, may D/C ordered dressing product/s and apply Normal Saline Moist Dressing daily until next [place] / Other MD appointment. [place] of regression in wound condition at [phone]. o Please direct any NON-WOUND related issues/requests for orders to patient's Primary Care Physician Wound #3 [place] for [place] to change Wednesdays and Fridays o Continue Home Health Visits o Home Health Nurse may visit PRN to address patientos wound care needs. o FACE TO FACE ENCOUNTER: MEDICARE and MEDICAID PATIENTS: I certify that this patient is under my care and that I had a face-to-face encounter that meets the physician face-to-face encounter requirements with this patient on this date. The encounter with the patient was in whole or in part for the following MEDICAL CONDITION: (primary reason for [place]) MEDICAL NECESSITY: I certify, that based on my findings, NURSING services are a medically necessary home health service. HOME BOUND STATUS: I certify that my clinical findings support that this patient is homebound (i.e., Due to illness or injury, pt requires aid of supportive devices such as crutches, cane, wheelchairs, walkers, the use of special transportation or the assistance of another person to leave their place of residence. There is a normal inability to leave the home and doing so requires considerable and taxing effort. Other absences are for medical reasons / religious services and are infrequent or of short duration when for other reasons). o If current dressing causes regression in wound condition, may D/C ordered dressing product/s and apply Normal Saline Moist Dressing daily until next [place] / Other MD appointment. [place] of regression in wound condition at [phone]. o Please direct any NON-WOUND related issues/requests for orders to patient's Primary Care Physician Wound #4 Right [place] for [place] to change Wednesdays and Fridays o Continue Home Health Visits o Home Health Nurse may visit PRN to address patientos wound care needs. o FACE TO FACE ENCOUNTER: MEDICARE and MEDICAID PATIENTS: I certify that this  patient is under my care and that I had a face-to-face encounter that meets the physician face-to-face encounter  requirements with this patient on this date. The encounter with the patient was in whole or in part for the following [place] ([id number]) CONDITION: (primary reason for Home Healthcare) MEDICAL NECESSITY: I certify, that based on my findings, NURSING services are a medically necessary home health service. HOME BOUND STATUS: I certify that my clinical findings support that this patient is homebound (i.e., Due to illness or injury, pt requires aid of supportive devices such as crutches, cane, wheelchairs, walkers, the use of special transportation or the assistance of another person to leave their place of residence. There is a normal inability to leave the home and doing so requires considerable and taxing effort. Other absences are for medical reasons / religious services and are infrequent or of short duration when for other reasons). o If current dressing causes regression in wound condition, may D/C ordered dressing product/s and apply Normal Saline Moist Dressing daily until next [place] / Other MD appointment. [place] of regression in wound condition at [phone]. o Please direct any NON-WOUND related issues/requests for orders to patient's Primary Care Physician Electronic Signature(s) Signed: 04/27/2019 3:07:10 PM By: Army Melia Signed: 04/27/2019 4:22:40 PM By: Worthy Keeler PA-C Entered By: Army Melia on 04/27/2019 09:28:30 Sandy Roberson ([id number]) -------------------------------------------------------------------------------- Problem List Details Patient Name: Sandy Roberson Date of Service: 04/27/2019 8:45 AM Medical Record Number: [id number] Patient Account Number: [id number] Date of Birth/Sex: 1915/12/25 (86 y.o. F) Treating RN: Cornell Barman Primary Care Provider: Tracie Harrier Other  Clinician: Referring Provider: Tracie Harrier Treating Provider/Extender: Melburn Hake, Shanda Cadotte Weeks in Treatment: 4 Active Problems ICD-10 Evaluated Encounter Code Description Active Date Today Diagnosis I73.89 Other specified peripheral vascular diseases 03/30/2019 No Yes T81.31XA Disruption of external operation (surgical) wound, not 03/30/2019 No Yes elsewhere classified, initial encounter L97.513 Non-pressure chronic ulcer of other part of right foot with 03/30/2019 No Yes necrosis of muscle L89.312 Pressure ulcer of right buttock, stage 2 04/22/2019 No Yes L97.821 Non-pressure chronic ulcer of other part of left lower leg 04/22/2019 No Yes limited to breakdown of skin R13.12 Dysphagia, oropharyngeal phase 03/30/2019 No Yes N18.30 Chronic kidney disease, stage 3 unspecified 03/30/2019 No Yes I50.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 0000000 No Yes (congestive) heart failure Inactive Problems Resolved Problems Sandy Roberson, Sandy Roberson ([id number]) Electronic Signature(s) Signed: 04/27/2019 9:07:42 AM By: Worthy Keeler PA-C Entered By: Worthy Keeler on 04/27/2019 09:07:42 Sandy Roberson ([id number]) -------------------------------------------------------------------------------- Progress Note Details Patient Name: Sandy Roberson Date of Service: 04/27/2019 8:45 AM Medical Record Number: [id number] Patient Account Number: [id number] Date of Birth/Sex: Mar 15, 1916 (86 y.o. F) Treating RN: Cornell Barman Primary Care Provider: Tracie Harrier Other Clinician: Referring Provider: Tracie Harrier Treating Provider/Extender: Melburn Hake, Diavion Labrador Weeks in Treatment: 4 Subjective Chief Complaint Information obtained from Patient Right medial foot surgical ulcer, Left LE ulcers, and right gluteal ulcer History of Present Illness (HPI) 03/30/2019 on evaluation today patient presents for initial evaluation here in our clinic as a referral from Dr. Selina Cooley  office who is a local podiatrist. Unfortunately the patient has experienced significant issues with decreased ability to heal in regard to a surgical site and again this is a fairly complicated history. The patient has undergone 3 vascular interventions the most recent of which was actually performed on 12//20. This was subsequent to the patient having ABIs which were somewhat poor registering at 0.67 on the right with a TBI of 0.16 and on the left she was 0.77 with a TBI of 0.24. Subsequently the patient had had  surgery to actually remove a portion of the first metatarsal on her right foot. They did not remove the toe although again from the patient's standpoint I am not 100% sure exactly the reasoning behind what was done or was not done but again I think a big part of the issue was trying to be as minimally invasive for the patient as possible do #1 to her arterial status #2 due to her age. Nonetheless unfortunately she has had some trouble with healing and when the sutures were removed the wound apparently dehisced. The patient does currently have more recent vascular intervention which again was on December 4 with Dr. Lazaro Arms and at this point the patient did have angioplasty which upon completion showed inline flow into the foot with less than 20% residual stenosis after angioplasties. There were multiple sites which had to be worked on during the procedure by Dr. Lucky Cowboy. Again the patient has very problematic blood flow with regard to her arterial status at this point. She does have a history of dysphagia, stage III kidney disease, and congestive heart failure as well. 04/06/2019 on evaluation today patient appears to be doing in my opinion about the same there may be slightly better with regard to the overall. With that being said she still is healing slowly obviously has only been 1 week and now the wounds do appear to be clean enough I still think there is can be quite a bit of work to do going  forward before will be where we really need to be here. She does have an appointment with vascular on Wednesday which is just 2 days away for repeat vascular studies along with evaluation with the physician as well. This will be good information for [place] to know going forward as far as her arterial status and what we can or cannot do from the standpoint of debridement or otherwise. 12/28-Patient returns at 1 week with the right medial metatarsal wound, this appears to be better with the rim of granulation tissue noted. We have been using Iodoflex. Patient had vascular studies done that indicate good flow to this foot, the left foot has ABI of 0.59 and a TBI 0.34 and patient has vascular follow-up for revascularization efforts. 04/20/2019 on evaluation today patient's right first metatarsal ulcer actually appears to be doing about the same is making slow progress. With that being said she unfortunately has blisters over the left lower extremity, left medial ankle, and she still has apparently a pressure ulcer in the gluteal region which is in the right gluteus and is completely new to [place]. This is something that was not known of previous. In fact they did not even tell us about it today until at the end of the visit when we were getting ready to go and her son mention this. 04/27/2019 upon evaluation today patient appears to be doing excellent with regard to her wounds all things considered. I do feel like she is making some progress here she did have her vascular intervention/angiogram which was on 04/23/2019. With that being said I do not have the records for review today as the note is not actually in yet with looking in the system. Nonetheless Dr. Lucky Cowboy did perform this procedure. The patient has had some increased swelling since the procedure in the left lower extremity but this is to be expected. Obviously that will cause the wounds to drain more. Nonetheless overall I do not see any signs of active  infection. Her right foot ulcer seems  to be making progress albeit slow. Sandy Roberson, Sandy Roberson ([id number]) Objective Constitutional Well-nourished and well-hydrated in no acute distress. Vitals Time Taken: 9:04 AM, Height: 63 in, Weight: 118 lbs, BMI: 20.9, Temperature: 96.8 F, Pulse: 79 bpm, Respiratory Rate: 16 breaths/min, Blood Pressure: 120/56 mmHg. Respiratory normal breathing without difficulty. Psychiatric this patient is able to make decisions and demonstrates good insight into disease process. Alert and Oriented x 3. pleasant and cooperative. General Notes: Patient's wounds currently all showed signs of improvement based on what I was seeing today. Fortunately there is no evidence of active infection which is great news and the patient seems to be tolerating the dressing changes without complication. No fevers, chills, nausea, vomiting, or diarrhea. Overall I think that were going to continue to manage her as we have been doing at all wound locations I feel like that is helping the only thing I will add likely will be Betadine to her toe area to help this dry out more significantly. Integumentary (Hair, Skin) Wound #1 status is Open. Original cause of wound was Gradually Appeared. The wound is located on the Right,Medial Metatarsal head first. The wound measures 4cm length x 2.9cm width x 1.5cm depth; 9.111cm^2 area and 13.666cm^3 volume. There is bone, muscle, and Fat Layer (Subcutaneous Tissue) Exposed exposed. There is no tunneling or undermining noted. There is a medium amount of serous drainage noted. Foul odor after cleansing was noted. The wound margin is flat and intact. There is small (1-33%) pink granulation within the wound bed. There is a large (67-100%) amount of necrotic tissue within the wound bed including Eschar, Adherent Slough and Necrosis of Muscle. Wound #2 status is Open. Original cause of wound was Blister. The wound is located on the Left,Medial Lower Leg.  The wound measures 4.5cm length x 5.6cm width x 0.1cm depth; 19.792cm^2 area and 1.979cm^3 volume. There is Fat Layer (Subcutaneous Tissue) Exposed exposed. There is no tunneling or undermining noted. There is a large amount of serous drainage noted. The wound margin is flat and intact. There is large (67-100%) pink granulation within the wound bed. There is a small (1-33%) amount of necrotic tissue within the wound bed. Wound #3 status is Open. Original cause of wound was Blister. The wound is located on the Left,Medial Ankle. The wound measures 1cm length x 1cm width x 0.2cm depth; 0.785cm^2 area and 0.157cm^3 volume. There is Fat Layer (Subcutaneous Tissue) Exposed exposed. There is no tunneling or undermining noted. There is a large amount of serous drainage noted. The wound margin is flat and intact. There is medium (34-66%) pink granulation within the wound bed. There is a medium (34-66%) amount of necrotic tissue within the wound bed. Wound #4 status is Open. Original cause of wound was Pressure Injury. The wound is located on the Right Gluteus. The wound measures 3.8cm length x 1.8cm width x 0.1cm depth; 5.372cm^2 area and 0.537cm^3 volume. There is Fat Layer (Subcutaneous Tissue) Exposed exposed. There is a medium amount of serous drainage noted. The wound margin is flat and intact. There is small (1-33%) pink granulation within the wound bed. There is a medium (34-66%) amount of necrotic tissue within the wound bed including Adherent Slough. Other Condition(s) Patient presents with Other Dermatologic Condition located on the Left Foot. General Notes: Blister on left 3rd toe Sandy Roberson, Sandy Roberson ([id number]) Assessment Active Problems ICD-10 Other specified peripheral vascular diseases Disruption of external operation (surgical) wound, not elsewhere classified, initial encounter Non-pressure chronic ulcer of other part of right foot with necrosis  of muscle Pressure ulcer of right  buttock, stage 2 Non-pressure chronic ulcer of other part of left lower leg limited to breakdown of skin Dysphagia, oropharyngeal phase Chronic kidney disease, stage 3 unspecified Chronic combined systolic (congestive) and diastolic (congestive) heart failure Plan Wound Cleansing: Wound #1 Right,Medial Metatarsal head first: Dial antibacterial soap, wash wounds, rinse and pat dry prior to dressing wounds Wound #2 Left,Medial Lower Leg: Dial antibacterial soap, wash wounds, rinse and pat dry prior to dressing wounds Wound #3 Left,Medial Ankle: Dial antibacterial soap, wash wounds, rinse and pat dry prior to dressing wounds Wound #4 Right Gluteus: Dial antibacterial soap, wash wounds, rinse and pat dry prior to dressing wounds Skin Barriers/Peri-Wound Care: Other: - betadine to left 3rd toe Wound #1 Right,Medial Metatarsal head first: Barrier cream Primary Wound Dressing: Wound #1 Right,Medial Metatarsal head first: Iodoflex Wound #2 Left,Medial Lower Leg: Silver Alginate Wound #3 Left,Medial Ankle: Silver Alginate Wound #4 Right Gluteus: Silver Collagen Secondary Dressing: Wound #1 Right,Medial Metatarsal head first: ABD and Kerlix/Conform - Conform Conform/Kerlix - conform to secure Drawtex - Place Drawtex right behind iodoflex Wound #2 Left,Medial Lower Leg: Conform/Kerlix - conform to secure Wound #3 Left,Medial Ankle: Conform/Kerlix - conform to secure Wound #4 Right Gluteus: Boardered Foam Dressing Dressing Change Frequency: Wound #1 Right,Medial Metatarsal head first: Sandy Roberson, Sandy Roberson ([id number]) Change Dressing Monday, Wednesday, Friday Wound #2 Left,Medial Lower Leg: Change Dressing Monday, Wednesday, Friday Wound #3 Left,Medial Ankle: Change Dressing Monday, Wednesday, Friday Wound #4 Right Gluteus: Change Dressing Monday, Wednesday, Friday Follow-up Appointments: Wound #1 Right,Medial Metatarsal head first: Return Appointment in 1 week. Wound #2  Left,Medial Lower Leg: Return Appointment in 1 week. Wound #3 Left,Medial Ankle: Return Appointment in 1 week. Wound #4 Right Gluteus: Return Appointment in 1 week. Edema Control: Wound #2 Left,Medial Lower Leg: Other: - Tubi Grip single layer Wound #3 Left,Medial Ankle: Other: - Tubi Grip single layer Home Health: Wound #1 Right,Medial Metatarsal head first: [place] for [place] to change Wednesdays and Fridays [place] Nurse may visit PRN to address patient s wound care needs. FACE TO FACE ENCOUNTER: MEDICARE and MEDICAID PATIENTS: I certify that this patient is under my care and that I had a face-to-face encounter that meets the physician face-to-face encounter requirements with this patient on this date. The encounter with the patient was in whole or in part for the following MEDICAL CONDITION: (primary reason for [place]) MEDICAL NECESSITY: I certify, that based on my findings, NURSING services are a medically necessary home health service. HOME BOUND STATUS: I certify that my clinical findings support that this patient is homebound (i.e., Due to illness or injury, pt requires aid of supportive devices such as crutches, cane, wheelchairs, walkers, the use of special transportation or the assistance of another person to leave their place of residence. There is a normal inability to leave the home and doing so requires considerable and taxing effort. Other absences are for medical reasons / religious services and are infrequent or of short duration when for other reasons). If current dressing causes regression in wound condition, may D/C ordered dressing product/s and apply Normal Saline Moist Dressing daily until next [place] / Other MD appointment. [place] of regression in wound condition at [phone]. Please direct any NON-WOUND related issues/requests for orders to  patient's Primary Care Physician Wound #2 Left,Medial Lower Leg: [place] for [place] to change Wednesdays  and Fridays [place] Nurse may visit PRN to address patient s wound care needs. FACE TO FACE ENCOUNTER: MEDICARE and MEDICAID PATIENTS: I certify that this patient is under my care and that I had a face-to-face encounter that meets the physician face-to-face encounter requirements with this patient on this date. The encounter with the patient was in whole or in part for the following MEDICAL CONDITION: (primary reason for [place]) MEDICAL NECESSITY: I certify, that based on my findings, NURSING services are a medically necessary home health service. HOME BOUND STATUS: I certify that my clinical findings support that this patient is homebound (i.e., Due to illness or injury, pt requires aid of supportive devices such as crutches, cane, wheelchairs, walkers, the use of special transportation or the assistance of another person to leave their place of residence. There is a normal inability to leave the home and doing so requires considerable and taxing effort. Other absences are for medical reasons / religious services and are infrequent or of short duration when for other reasons). If current dressing causes regression in wound condition, may D/C ordered dressing product/s and apply Normal Saline Moist Dressing daily until next [place] / Other MD appointment. [place] of regression in wound condition at [phone]. Please direct any NON-WOUND related issues/requests for orders to patient's Primary Care Physician Wound #3 Left,Medial Ankle: [place] for [place] to change Wednesdays and Fridays [place] Nurse may visit PRN to address patient s wound care needs. Sandy Roberson, Sandy Roberson ([id number]) FACE TO FACE  ENCOUNTER: MEDICARE and MEDICAID PATIENTS: I certify that this patient is under my care and that I had a face-to-face encounter that meets the physician face-to-face encounter requirements with this patient on this date. The encounter with the patient was in whole or in part for the following MEDICAL CONDITION: (primary reason for [place]) MEDICAL NECESSITY: I certify, that based on my findings, NURSING services are a medically necessary home health service. HOME BOUND STATUS: I certify that my clinical findings support that this patient is homebound (i.e., Due to illness or injury, pt requires aid of supportive devices such as crutches, cane, wheelchairs, walkers, the use of special transportation or the assistance of another person to leave their place of residence. There is a normal inability to leave the home and doing so requires considerable and taxing effort. Other absences are for medical reasons / religious services and are infrequent or of short duration when for other reasons). If current dressing causes regression in wound condition, may D/C ordered dressing product/s and apply Normal Saline Moist Dressing daily until next [place] / Other MD appointment. [place] of regression in wound condition at [phone]. Please direct any NON-WOUND related issues/requests for orders to patient's Primary Care Physician Wound #4 Right Gluteus: [place] for [place] to change Wednesdays and Fridays [place] Nurse may visit PRN to address patient s wound care needs. FACE TO FACE ENCOUNTER: MEDICARE and MEDICAID PATIENTS: I certify that this patient is under my care and that I had a face-to-face encounter that meets the physician face-to-face encounter requirements with this patient on this date. The encounter with the patient was in whole or in part for the following MEDICAL CONDITION:  (primary reason for [place]) MEDICAL NECESSITY: I certify, that based on my findings, NURSING services are a medically  necessary home health service. HOME BOUND STATUS: I certify that my clinical findings support that this patient is homebound (i.e., Due to illness or injury, pt requires aid of supportive devices such as crutches, cane, wheelchairs, walkers, the use of special transportation or the assistance of another person to leave their place of residence. There is a normal inability to leave the home and doing so requires considerable and taxing effort. Other absences are for medical reasons / religious services and are infrequent or of short duration when for other reasons). If current dressing causes regression in wound condition, may D/C ordered dressing product/s and apply Normal Saline Moist Dressing daily until next [place] / Other MD appointment. [place] of regression in wound condition at [phone]. Please direct any NON-WOUND related issues/requests for orders to patient's Primary Care Physician 1. I would recommend currently that we go ahead and initiate a continuation of the wound care measures this includes utilization of silver alginate dressings to the lower extremity ulcers on the left. 2. We will continue to use the Iodoflex for the foot ulcer on the right. 3. I am can add Betadine to the left third toe which is not actually an open wound at this point but I do feel like we need to try to help this to dry out in order to prevent infection from setting in. It is more of a blood blister at this point. We will see patient back for reevaluation in 1 week here in the clinic. If anything worsens or changes patient will contact our office for additional recommendations. Electronic Signature(s) Signed: 04/27/2019 10:56:32 AM By: Worthy Keeler PA-C Entered By: Worthy Keeler on 04/27/2019 10:56:31 Sandy Roberson  ([id number]) -------------------------------------------------------------------------------- SuperBill Details Patient Name: Sandy Roberson Date of Service: 04/27/2019 Medical Record Number: [id number] Patient Account Number: [id number] Date of Birth/Sex: [birth year] (86 y.o. 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 R13.12 Dysphagia, oropharyngeal phase N18.30 Chronic kidney disease, stage 3 unspecified I50.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 (congestive) heart failure Facility Procedures CPT4 Code: [id number] Description: [id number] - WOUND CARE VISIT-LEV 5 EST PT Modifier: Quantity: 1 Physician Procedures CPT4: Description Modifier Quantity Code [id number] - WC PHYS LEVEL 3 - EST PT 1 ICD-10 Diagnosis Description I73.89 Other specified peripheral vascular diseases T81.31XA Disruption of external operation (surgical) wound, not elsewhere classified,  initial encounter L97.513 Non-pressure chronic ulcer of other part of right foot with necrosis of muscle L89.312 Pressure ulcer of right buttock, stage 2 Electronic Signature(s) Signed: 04/27/2019 4:37:53 PM By: Gretta Cool, BSN, RN, CWS, Kim RN, BSN Previous Signature: 04/27/2019 10:56:45 AM Version By: Worthy Keeler PA-C Entered By: Gretta Cool, BSN, RN, CWS, Kim on 04/27/2019 16:37:53

## 2019-04-29 ENCOUNTER — Other Ambulatory Visit (INDEPENDENT_AMBULATORY_CARE_PROVIDER_SITE_OTHER): Payer: Self-pay | Admitting: Vascular Surgery

## 2019-04-29 DIAGNOSIS — I70222 Atherosclerosis of native arteries of extremities with rest pain, left leg: Secondary | ICD-10-CM

## 2019-04-29 DIAGNOSIS — Z95820 Peripheral vascular angioplasty status with implants and grafts: Secondary | ICD-10-CM

## 2019-05-01 ENCOUNTER — Encounter (INDEPENDENT_AMBULATORY_CARE_PROVIDER_SITE_OTHER): Payer: Medicare Other

## 2019-05-01 ENCOUNTER — Ambulatory Visit (INDEPENDENT_AMBULATORY_CARE_PROVIDER_SITE_OTHER): Payer: Medicare Other | Admitting: Vascular Surgery

## 2019-05-04 ENCOUNTER — Encounter: Payer: Medicare Other | Admitting: Physician Assistant

## 2019-05-08 ENCOUNTER — Encounter: Payer: Medicare Other | Admitting: Physician Assistant

## 2019-05-08 ENCOUNTER — Other Ambulatory Visit: Payer: Self-pay

## 2019-05-08 DIAGNOSIS — L97821 Non-pressure chronic ulcer of other part of left lower leg limited to breakdown of skin: Secondary | ICD-10-CM | POA: Diagnosis not present

## 2019-05-08 NOTE — Progress Notes (Addendum)
AKI, THOMLINSON ([id number]) Visit Report for 05/08/2019 Chief Complaint Document Details Patient Name: Sandy Roberson, Sandy Roberson Date of Service: 05/08/2019 9:15 AM Medical Record Number: [id number] Patient Account Number: [id number] Date of Birth/Sex: 11/03/1915 (86 y.o. F) Treating RN: Cornell Barman Primary Care Provider: Tracie Harrier Other Clinician: Referring Provider: Tracie Harrier Treating Provider/Extender: Melburn Hake, Lucylle Foulkes Weeks in Treatment: 5 Information Obtained from: Patient Chief Complaint Right medial foot surgical ulcer, Left LE ulcers, and right gluteal ulcer Electronic Signature(s) Signed: 05/08/2019 9:55:06 AM By: Worthy Keeler PA-C Entered By: Worthy Keeler on 05/08/2019 09:55:06 Sandy Roberson ([id number]) -------------------------------------------------------------------------------- HPI Details Patient Name: Sandy Roberson Date of Service: 05/08/2019 9:15 AM Medical Record Number: [id number] Patient Account Number: [id number] Date of Birth/Sex: 01/03/1916 (86 y.o. F) Treating RN: Cornell Barman Primary Care Provider: Tracie Harrier Other Clinician: Referring Provider: Tracie Harrier Treating Provider/Extender: Melburn Hake, Rhian Funari Weeks in Treatment: 5 History of Present Illness HPI Description: 03/30/2019 on evaluation today patient presents for initial evaluation here in our clinic as a referral from Dr. Selina Cooley office who is a local podiatrist. Unfortunately the patient has experienced significant issues with decreased ability to heal in regard to a surgical site and again this is a fairly complicated history. The patient has undergone 3 vascular interventions the most recent of which was actually performed on 12//20. This was subsequent to the patient having ABIs which were somewhat poor registering at 0.67 on the right with a TBI of 0.16 and on the left she was 0.77 with a TBI of 0.24. Subsequently the patient had had surgery to actually  remove a portion of the first metatarsal on her right foot. They did not remove the toe although again from the patient's standpoint I am not 100% sure exactly the reasoning behind what was done or was not done but again I think a big part of the issue was trying to be as minimally invasive for the patient as possible do #1 to her arterial status #2 due to her age. Nonetheless unfortunately she has had some trouble with healing and when the sutures were removed the wound apparently dehisced. The patient does currently have more recent vascular intervention which again was on December 4 with Dr. Lazaro Arms and at this point the patient did have angioplasty which upon completion showed inline flow into the foot with less than 20% residual stenosis after angioplasties. There were multiple sites which had to be worked on during the procedure by Dr. Lucky Cowboy. Again the patient has very problematic blood flow with regard to her arterial status at this point. She does have a history of dysphagia, stage III kidney disease, and congestive heart failure as well. 04/06/2019 on evaluation today patient appears to be doing in my opinion about the same there may be slightly better with regard to the overall. With that being said she still is healing slowly obviously has only been 1 week and now the wounds do appear to be clean enough I still think there is can be quite a bit of work to do going forward before will be where we really need to be here. She does have an appointment with vascular on Wednesday which is just 2 days away for repeat vascular studies along with evaluation with the physician as well. This will be good information for [place] to know going forward as far as her arterial status and what we can or cannot do from the standpoint of debridement or otherwise. 12/28-Patient returns at 1 week with the right medial metatarsal wound,  this appears to be better with the rim of granulation tissue noted. We have been using  Iodoflex. Patient had vascular studies done that indicate good flow to this foot, the left foot has ABI of 0.59 and a TBI 0.34 and patient has vascular follow-up for revascularization efforts. 04/20/2019 on evaluation today patient's right first metatarsal ulcer actually appears to be doing about the same is making slow progress. With that being said she unfortunately has blisters over the left lower extremity, left medial ankle, and she still has apparently a pressure ulcer in the gluteal region which is in the right gluteus and is completely new to [place]. This is something that was not known of previous. In fact they did not even tell us about it today until at the end of the visit when we were getting ready to go and her son mention this. 04/27/2019 upon evaluation today patient appears to be doing excellent with regard to her wounds all things considered. I do feel like she is making some progress here she did have her vascular intervention/angiogram which was on 04/23/2019. With that being said I do not have the records for review today as the note is not actually in yet with looking in the system. Nonetheless Dr. Lucky Cowboy did perform this procedure. The patient has had some increased swelling since the procedure in the left lower extremity but this is to be expected. Obviously that will cause the wounds to drain more. Nonetheless overall I do not see any signs of active infection. Her right foot ulcer seems to be making progress albeit slow. 05/08/2019 on evaluation today patient appears to be doing well with regard to her wounds in general. I feel like that other than the fact that her toe ulcer on the left third toe worsened from where she had just a small blood blister to where it actually developed into more of a wound. This is something that now hopefully should reverse now that she has had the intervention on the left side and hopefully is better able to heal some of these wound areas in fact I really  feel like a lot is drying up and appearing much better than what it was previous. Fortunately there is no signs of active infection at this time. No fevers, chills, nausea, vomiting, or diarrhea. SUKHJIT, ERK ([id number]) Electronic Signature(s) Signed: 05/08/2019 10:25:07 AM By: Worthy Keeler PA-C Entered By: Worthy Keeler on 05/08/2019 10:25:07 CHATTIE, RENTER ([id number]) -------------------------------------------------------------------------------- Physical Exam Details Patient Name: Sandy Roberson Date of Service: 05/08/2019 9:15 AM Medical Record Number: [id number] Patient Account Number: [id number] Date of Birth/Sex: 11/27/1915 (86 y.o. F) Treating RN: Cornell Barman Primary Care Provider: Tracie Harrier Other Clinician: Referring Provider: Tracie Harrier Treating Provider/Extender: Melburn Hake, Melitza Metheny Weeks in Treatment: 5 Constitutional Well-nourished and well-hydrated in no acute distress. Respiratory normal breathing without difficulty. Psychiatric this patient is able to make decisions and demonstrates good insight into disease process. Alert and Oriented x 3. pleasant and cooperative. Notes Patient's wounds in general seem to be doing better especially on the right first metatarsal region as well as the gluteal region they have switched to use using zinc over the gluteus. We have been using Iodoflex on the metatarsal site. For the other locations we have been using an alginate dressing and I think that is probably still best to continue at this point. Fortunately there is no evidence of active infection at this time. Electronic Signature(s) Signed: 05/08/2019 10:25:37 AM By: Worthy Keeler PA-C Entered By:  Worthy Keeler on 05/08/2019 10:25:36 MADINA, LAGUE ([id number]) -------------------------------------------------------------------------------- Physician Orders Details Patient Name: XIOLA, ZABLOCKI Date of Service: 05/08/2019 9:15  AM Medical Record Number: [id number] Patient Account Number: [id number] Date of Birth/Sex: [birth year] (86 y.o. F) Treating RN: Army Melia Primary Care Provider: Tracie Harrier Other Clinician: Referring Provider: Tracie Harrier Treating Provider/Extender: Melburn Hake, Kati Riggenbach Weeks in Treatment: 5 Verbal / Phone Orders: No Diagnosis Coding ICD-10 Coding Code Description I73.89 Other specified peripheral vascular diseases T81.31XA Disruption of external operation (surgical) wound, not elsewhere classified, initial encounter L97.513 Non-pressure chronic ulcer of other part of right foot with necrosis of muscle L89.312 Pressure ulcer of right buttock, stage 2 L97.821 Non-pressure chronic ulcer of other part of left lower leg limited to breakdown of skin R13.12 Dysphagia, oropharyngeal phase N18.30 Chronic kidney disease, stage 3 unspecified I50.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 (congestive) heart failure Wound Cleansing Wound #1 Right,Medial Metatarsal head first o Dial antibacterial soap, wash wounds, rinse and pat dry prior to dressing wounds Wound #2 Left,Medial Lower Leg o Dial antibacterial soap, wash wounds, rinse and pat dry prior to dressing wounds Wound #3 Left,Medial Ankle o Dial antibacterial soap, wash wounds, rinse and pat dry prior to dressing wounds Wound #4 Right Gluteus o Dial antibacterial soap, wash wounds, rinse and pat dry prior to dressing wounds Wound #5 Left Toe Third o Dial antibacterial soap, wash wounds, rinse and pat dry prior to dressing wounds Skin Barriers/Peri-Wound Care Wound #1 Right,Medial Metatarsal head first o Barrier cream Wound #4 Right Gluteus o Barrier cream Primary Wound Dressing Wound #1 Right,Medial Metatarsal head first o Iodoflex Wound #2 Left,Medial Lower Leg o Silver Alginate Wound #3 Left,Medial Ankle Emma, Bradi ([id number]) o Silver Alginate Wound #5 Left Toe Third o  Silver Alginate Secondary Dressing Wound #1 Right,Medial Metatarsal head first o ABD pad o Conform/Kerlix - conform to secure o Drawtex - Place Drawtex right behind iodoflex Wound #2 Left,Medial Lower Leg o ABD pad o Conform/Kerlix - conform to secure o Drawtex - Place Drawtex right behind iodoflex Wound #3 Left,Medial Ankle o ABD pad o Conform/Kerlix - conform to secure o Drawtex - Place Drawtex right behind iodoflex Dressing Change Frequency Wound #1 Right,Medial Metatarsal head first o Change Dressing Monday, Wednesday, Friday Wound #2 Left,Medial Lower Leg o Change Dressing Monday, Wednesday, Friday Wound #3 Left,Medial Ankle o Change Dressing Monday, Wednesday, Friday Wound #4 Right Gluteus o Change Dressing Monday, Wednesday, Friday Wound #5 Left Toe Third o Change Dressing Monday, Wednesday, Friday Follow-up Appointments Wound #1 Right,Medial Metatarsal head first o Return Appointment in 1 week. Wound #2 Left,Medial Lower Leg o Return Appointment in 1 week. Wound #3 Left,Medial Ankle o Return Appointment in 1 week. Wound #4 Right Gluteus o Return Appointment in 1 week. Wound #5 Left Toe Third o Return Appointment in 1 week. Edema Control Wound #2 Left,Medial Lower Leg o Other: - Tubi Grip single layer Vold, Juliauna ([id number]) Wound #3 Left,Medial Ankle o Other: - Tubi Grip single layer Home Health Wound #1 Right,Medial Metatarsal head first o [place] for [place] to change Wednesdays and Fridays o Continue Home Health Visits o Home Health Nurse may visit PRN to address patientos wound care needs. o FACE TO FACE ENCOUNTER: MEDICARE and MEDICAID PATIENTS: I certify that this patient is under my care and that I had a face-to-face encounter that meets the physician face-to-face encounter requirements with this patient on this date. The encounter with the patient was  in whole or in part for the following MEDICAL CONDITION: (primary reason for Home Healthcare) MEDICAL NECESSITY: I certify, that based on my findings, NURSING services are a medically necessary home health service. HOME BOUND STATUS: I certify that my clinical findings support that this patient is homebound (i.e., Due to illness or injury, pt requires aid of supportive devices such as crutches, cane, wheelchairs, walkers, the use of special transportation or the assistance of another person to leave their place of residence. There is a normal inability to leave the home and doing so requires considerable and taxing effort. Other absences are for medical reasons / religious services and are infrequent or of short duration when for other reasons). o If current dressing causes regression in wound condition, may D/C ordered dressing product/s and apply Normal Saline Moist Dressing daily until next [place] / Other MD appointment. [place] of regression in wound condition at [phone]. o Please direct any NON-WOUND related issues/requests for orders to patient's Primary Care Physician Wound #2 [place] for [place] to change Wednesdays and Fridays o Continue Home Health Visits o Home Health Nurse may visit PRN to address patientos wound care needs. o FACE TO FACE ENCOUNTER: MEDICARE and MEDICAID PATIENTS: I certify that this patient is under my care and that I had a face-to-face encounter that meets the physician face-to-face encounter requirements with this patient on this date. The encounter with the patient was in whole or in part for the following MEDICAL CONDITION: (primary reason for [place]) MEDICAL NECESSITY: I certify, that based on my findings, NURSING services are a medically necessary home health service. HOME BOUND STATUS: I certify that my clinical findings support  that this patient is homebound (i.e., Due to illness or injury, pt requires aid of supportive devices such as crutches, cane, wheelchairs, walkers, the use of special transportation or the assistance of another person to leave their place of residence. There is a normal inability to leave the home and doing so requires considerable and taxing effort. Other absences are for medical reasons / religious services and are infrequent or of short duration when for other reasons). o If current dressing causes regression in wound condition, may D/C ordered dressing product/s and apply Normal Saline Moist Dressing daily until next [place] / Other MD appointment. [place] of regression in wound condition at [phone]. o Please direct any NON-WOUND related issues/requests for orders to patient's Primary Care Physician Wound #3 [place] for [place] to change Wednesdays and Fridays o Continue Home Health Visits o Home Health Nurse may visit PRN to address patientos wound care needs. o FACE TO FACE ENCOUNTER: MEDICARE and MEDICAID PATIENTS: I certify that this patient is under my care and that I had a face-to-face encounter that meets the physician face-to-face encounter requirements with this patient on this date. The encounter with the patient was in whole or in part for the following MEDICAL CONDITION: (primary reason for [place]) MEDICAL NECESSITY: I certify, that based on my findings, NURSING services are a medically necessary home health service. HOME BOUND STATUS: I certify that my clinical findings support that this patient is homebound (i.e., Due to illness or injury, pt requires aid of supportive devices such as crutches, cane, wheelchairs, walkers, the use of special transportation or the assistance of another person to leave their place of residence. There  is a normal inability to  leave the home and doing so requires considerable and taxing effort. Other absences are for medical reasons / religious services and are infrequent or of short duration when for other reasons). INFINITY, WIEDERHOLT ([id number]) o If current dressing causes regression in wound condition, may D/C ordered dressing product/s and apply Normal Saline Moist Dressing daily until next [place] / Other MD appointment. [place] of regression in wound condition at [phone]. o Please direct any NON-WOUND related issues/requests for orders to patient's Primary Care Physician Wound #4 Right [place] for [place] to change Wednesdays and Fridays o Continue Home Health Visits o Home Health Nurse may visit PRN to address patientos wound care needs. o FACE TO FACE ENCOUNTER: MEDICARE and MEDICAID PATIENTS: I certify that this patient is under my care and that I had a face-to-face encounter that meets the physician face-to-face encounter requirements with this patient on this date. The encounter with the patient was in whole or in part for the following MEDICAL CONDITION: (primary reason for [place]) MEDICAL NECESSITY: I certify, that based on my findings, NURSING services are a medically necessary home health service. HOME BOUND STATUS: I certify that my clinical findings support that this patient is homebound (i.e., Due to illness or injury, pt requires aid of supportive devices such as crutches, cane, wheelchairs, walkers, the use of special transportation or the assistance of another person to leave their place of residence. There is a normal inability to leave the home and doing so requires considerable and taxing effort. Other absences are for medical reasons / religious services and are infrequent or of short duration when for other reasons). o If current dressing causes regression in wound condition,  may D/C ordered dressing product/s and apply Normal Saline Moist Dressing daily until next [place] / Other MD appointment. [employer] of regression in wound condition at [phone]. o Please direct any NON-WOUND related issues/requests for orders to patient's Primary Care Physician Wound #5 Left Toe [place] for [place] to change Wednesdays and Fridays o Continue Home Health Visits o Home Health Nurse may visit PRN to address patientos wound care needs. o FACE TO FACE ENCOUNTER: MEDICARE and MEDICAID PATIENTS: I certify that this patient is under my care and that I had a face-to-face encounter that meets the physician face-to-face encounter requirements with this patient on this date. The encounter with the patient was in whole or in part for the following MEDICAL CONDITION: (primary reason for [place]) MEDICAL NECESSITY: I certify, that based on my findings, NURSING services are a medically necessary home health service. HOME BOUND STATUS: I certify that my clinical findings support that this patient is homebound (i.e., Due to illness or injury, pt requires aid of supportive devices such as crutches, cane, wheelchairs, walkers, the use of special transportation or the assistance of another person to leave their place of residence. There is a normal inability to leave the home and doing so requires considerable and taxing effort. Other absences are for medical reasons / religious services and are infrequent or of short duration when for other reasons). o If current dressing causes regression in wound condition, may D/C ordered dressing product/s and apply Normal Saline Moist Dressing daily until next [place] / Other MD appointment. [place] of regression in wound condition at [phone]. o Please direct  any NON-WOUND related issues/requests for orders to  patient's Primary Care Physician Electronic Signature(s) Signed: 05/08/2019 10:35:44 AM By: Army Melia Signed: 05/08/2019 1:21:59 PM By: Worthy Keeler PA-C Entered By: Army Melia on 05/08/2019 10:35:43 Sandy Roberson ([id number]) -------------------------------------------------------------------------------- Problem List Details Patient Name: Sandy Roberson Date of Service: 05/08/2019 9:15 AM Medical Record Number: [id number] Patient Account Number: [id number] Date of Birth/Sex: 08/09/1915 (86 y.o. F) Treating RN: Cornell Barman Primary Care Provider: Tracie Harrier Other Clinician: Referring Provider: Tracie Harrier Treating Provider/Extender: Melburn Hake, Jasime Westergren Weeks in Treatment: 5 Active Problems ICD-10 Evaluated Encounter Code Description Active Date Today Diagnosis I73.89 Other specified peripheral vascular diseases 03/30/2019 No Yes T81.31XA Disruption of external operation (surgical) wound, not 03/30/2019 No Yes elsewhere classified, initial encounter L97.513 Non-pressure chronic ulcer of other part of right foot with 03/30/2019 No Yes necrosis of muscle L89.312 Pressure ulcer of right buttock, stage 2 04/22/2019 No Yes L97.821 Non-pressure chronic ulcer of other part of left lower leg 04/22/2019 No Yes limited to breakdown of skin R13.12 Dysphagia, oropharyngeal phase 03/30/2019 No Yes N18.30 Chronic kidney disease, stage 3 unspecified 03/30/2019 No Yes I50.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 0000000 No Yes (congestive) heart failure Inactive Problems Resolved Problems LIDWINA, SHEFFLER ([id number]) Electronic Signature(s) Signed: 05/08/2019 9:54:59 AM By: Worthy Keeler PA-C Entered By: Worthy Keeler on 05/08/2019 09:54:58 Bowerman, Sascha ([id number]) -------------------------------------------------------------------------------- Progress Note Details Patient Name: Sandy Roberson Date of Service: 05/08/2019 9:15  AM Medical Record Number: [id number] Patient Account Number: [id number] Date of Birth/Sex: 1916/03/18 (86 y.o. F) Treating RN: Cornell Barman Primary Care Provider: Tracie Harrier Other Clinician: Referring Provider: Tracie Harrier Treating Provider/Extender: Melburn Hake, Donna Silverman Weeks in Treatment: 5 Subjective Chief Complaint Information obtained from Patient Right medial foot surgical ulcer, Left LE ulcers, and right gluteal ulcer History of Present Illness (HPI) 03/30/2019 on evaluation today patient presents for initial evaluation here in our clinic as a referral from Dr. Selina Cooley office who is a local podiatrist. Unfortunately the patient has experienced significant issues with decreased ability to heal in regard to a surgical site and again this is a fairly complicated history. The patient has undergone 3 vascular interventions the most recent of which was actually performed on 12//20. This was subsequent to the patient having ABIs which were somewhat poor registering at 0.67 on the right with a TBI of 0.16 and on the left she was 0.77 with a TBI of 0.24. Subsequently the patient had had surgery to actually remove a portion of the first metatarsal on her right foot. They did not remove the toe although again from the patient's standpoint I am not 100% sure exactly the reasoning behind what was done or was not done but again I think a big part of the issue was trying to be as minimally invasive for the patient as possible do #1 to her arterial status #2 due to her age. Nonetheless unfortunately she has had some trouble with healing and when the sutures were removed the wound apparently dehisced. The patient does currently have more recent vascular intervention which again was on December 4 with Dr. Lazaro Arms and at this point the patient did have angioplasty which upon completion showed inline flow into the foot with less than 20% residual stenosis after angioplasties. There were multiple sites  which had to be worked on during the procedure by Dr. Lucky Cowboy. Again the patient has very problematic blood flow with regard to her arterial status at this point. She does have a history of dysphagia, stage  III kidney disease, and congestive heart failure as well. 04/06/2019 on evaluation today patient appears to be doing in my opinion about the same there may be slightly better with regard to the overall. With that being said she still is healing slowly obviously has only been 1 week and now the wounds do appear to be clean enough I still think there is can be quite a bit of work to do going forward before will be where we really need to be here. She does have an appointment with vascular on Wednesday which is just 2 days away for repeat vascular studies along with evaluation with the physician as well. This will be good information for [place] to know going forward as far as her arterial status and what we can or cannot do from the standpoint of debridement or otherwise. 12/28-Patient returns at 1 week with the right medial metatarsal wound, this appears to be better with the rim of granulation tissue noted. We have been using Iodoflex. Patient had vascular studies done that indicate good flow to this foot, the left foot has ABI of 0.59 and a TBI 0.34 and patient has vascular follow-up for revascularization efforts. 04/20/2019 on evaluation today patient's right first metatarsal ulcer actually appears to be doing about the same is making slow progress. With that being said she unfortunately has blisters over the left lower extremity, left medial ankle, and she still has apparently a pressure ulcer in the gluteal region which is in the right gluteus and is completely new to [place]. This is something that was not known of previous. In fact they did not even tell us about it today until at the end of the visit when we were getting ready to go and her son mention this. 04/27/2019 upon evaluation today patient appears  to be doing excellent with regard to her wounds all things considered. I do feel like she is making some progress here she did have her vascular intervention/angiogram which was on 04/23/2019. With that being said I do not have the records for review today as the note is not actually in yet with looking in the system. Nonetheless Dr. Lucky Cowboy did perform this procedure. The patient has had some increased swelling since the procedure in the left lower extremity but this is to be expected. Obviously that will cause the wounds to drain more. Nonetheless overall I do not see any signs of active infection. Her right foot ulcer seems to be making progress albeit slow. 05/08/2019 on evaluation today patient appears to be doing well with regard to her wounds in general. I feel like that other than the fact that her toe ulcer on the left third toe worsened from where she had just a small blood blister to where it actually Belmond, Bruin ([id number]) developed into more of a wound. This is something that now hopefully should reverse now that she has had the intervention on the left side and hopefully is better able to heal some of these wound areas in fact I really feel like a lot is drying up and appearing much better than what it was previous. Fortunately there is no signs of active infection at this time. No fevers, chills, nausea, vomiting, or diarrhea. Objective Constitutional Well-nourished and well-hydrated in no acute distress. Vitals Time Taken: 9:17 AM, Height: 63 in, Weight: 118 lbs, BMI: 20.9, Temperature: 96.9 F, Pulse: 84 bpm, Respiratory Rate: 16 breaths/min, Blood Pressure: 102/58 mmHg. Respiratory normal breathing without difficulty. Psychiatric this patient is able to make  decisions and demonstrates good insight into disease process. Alert and Oriented x 3. pleasant and cooperative. General Notes: Patient's wounds in general seem to be doing better especially on the right first  metatarsal region as well as the gluteal region they have switched to use using zinc over the gluteus. We have been using Iodoflex on the metatarsal site. For the other locations we have been using an alginate dressing and I think that is probably still best to continue at this point. Fortunately there is no evidence of active infection at this time. Integumentary (Hair, Skin) Wound #1 status is Open. Original cause of wound was Gradually Appeared. The wound is located on the Right,Medial Metatarsal head first. The wound measures 3.2cm length x 3cm width x 1.5cm depth; 7.54cm^2 area and 11.31cm^3 volume. There is bone, muscle, and Fat Layer (Subcutaneous Tissue) Exposed exposed. There is a medium amount of serous drainage noted. Foul odor after cleansing was noted. The wound margin is flat and intact. There is small (1-33%) pink granulation within the wound bed. There is a large (67-100%) amount of necrotic tissue within the wound bed including Eschar, Adherent Slough and Necrosis of Muscle. Wound #2 status is Open. Original cause of wound was Blister. The wound is located on the Left,Medial Lower Leg. The wound measures 4.5cm length x 5cm width x 0.1cm depth; 17.671cm^2 area and 1.767cm^3 volume. There is Fat Layer (Subcutaneous Tissue) Exposed exposed. There is a large amount of serous drainage noted. The wound margin is flat and intact. There is large (67-100%) pink granulation within the wound bed. There is a small (1-33%) amount of necrotic tissue within the wound bed. Wound #3 status is Open. Original cause of wound was Blister. The wound is located on the Left,Medial Ankle. The wound measures 0.7cm length x 1.1cm width x 0.2cm depth; 0.605cm^2 area and 0.121cm^3 volume. There is Fat Layer (Subcutaneous Tissue) Exposed exposed. There is a large amount of serous drainage noted. The wound margin is flat and intact. There is medium (34-66%) pink granulation within the wound bed. There is a  medium (34-66%) amount of necrotic tissue within the wound bed. Wound #4 status is Open. Original cause of wound was Pressure Injury. The wound is located on the Right Gluteus. The wound measures 3.5cm length x 1.5cm width x 0.1cm depth; 4.123cm^2 area and 0.412cm^3 volume. There is Fat Layer (Subcutaneous Tissue) Exposed exposed. There is a medium amount of serous drainage noted. The wound margin is flat and intact. There is small (1-33%) pink granulation within the wound bed. There is a medium (34-66%) amount of necrotic tissue within the wound bed including Adherent Slough. TANELL, WITTSTRUCK ([id number]) Wound #5 status is Open. Original cause of wound was Gradually Appeared. The wound is located on the Left Toe Third. The wound measures 3cm length x 1.5cm width x 0.1cm depth; 3.534cm^2 area and 0.353cm^3 volume. There is no tunneling or undermining noted. There is a medium amount of serous drainage noted. The wound margin is indistinct and nonvisible. There is no granulation within the wound bed. There is a large (67-100%) amount of necrotic tissue within the wound bed including Eschar. Assessment Active Problems ICD-10 Other specified peripheral vascular diseases Disruption of external operation (surgical) wound, not elsewhere classified, initial encounter Non-pressure chronic ulcer of other part of right foot with necrosis of muscle Pressure ulcer of right buttock, stage 2 Non-pressure chronic ulcer of other part of left lower leg limited to breakdown of skin Dysphagia, oropharyngeal phase Chronic kidney disease, stage  3 unspecified Chronic combined systolic (congestive) and diastolic (congestive) heart failure Plan Wound Cleansing: Wound #1 Right,Medial Metatarsal head first: Dial antibacterial soap, wash wounds, rinse and pat dry prior to dressing wounds Wound #2 Left,Medial Lower Leg: Dial antibacterial soap, wash wounds, rinse and pat dry prior to dressing wounds Wound #3  Left,Medial Ankle: Dial antibacterial soap, wash wounds, rinse and pat dry prior to dressing wounds Wound #4 Right Gluteus: Dial antibacterial soap, wash wounds, rinse and pat dry prior to dressing wounds Wound #5 Left Toe Third: Dial antibacterial soap, wash wounds, rinse and pat dry prior to dressing wounds Skin Barriers/Peri-Wound Care: Wound #1 Right,Medial Metatarsal head first: Barrier cream Wound #4 Right Gluteus: Barrier cream Primary Wound Dressing: Wound #1 Right,Medial Metatarsal head first: Iodoflex Wound #2 Left,Medial Lower Leg: Silver Alginate Wound #3 Left,Medial Ankle: Silver Alginate Wound #5 Left Toe Third: Silver Alginate Secondary Dressing: Wound #1 Right,Medial Metatarsal head first: SARABELLE, BELLAVANCE ([id number]) ABD pad Conform/Kerlix - conform to secure Drawtex - Place Drawtex right behind iodoflex Wound #2 Left,Medial Lower Leg: ABD pad Conform/Kerlix - conform to secure Drawtex - Place Drawtex right behind iodoflex Wound #3 Left,Medial Ankle: ABD pad Conform/Kerlix - conform to secure Drawtex - Place Drawtex right behind iodoflex Dressing Change Frequency: Wound #1 Right,Medial Metatarsal head first: Change Dressing Monday, Wednesday, Friday Wound #2 Left,Medial Lower Leg: Change Dressing Monday, Wednesday, Friday Wound #3 Left,Medial Ankle: Change Dressing Monday, Wednesday, Friday Wound #4 Right Gluteus: Change Dressing Monday, Wednesday, Friday Wound #5 Left Toe Third: Change Dressing Monday, Wednesday, Friday Follow-up Appointments: Wound #1 Right,Medial Metatarsal head first: Return Appointment in 1 week. Wound #2 Left,Medial Lower Leg: Return Appointment in 1 week. Wound #3 Left,Medial Ankle: Return Appointment in 1 week. Wound #4 Right Gluteus: Return Appointment in 1 week. Wound #5 Left Toe Third: Return Appointment in 1 week. Edema Control: Wound #2 Left,Medial Lower Leg: Other: - Tubi Grip single layer Wound #3  Left,Medial Ankle: Other: - Tubi Grip single layer Home Health: Wound #1 Right,Medial Metatarsal head first: [place] for [place] to change Wednesdays and Fridays [place] Nurse may visit PRN to address patient s wound care needs. FACE TO FACE ENCOUNTER: MEDICARE and MEDICAID PATIENTS: I certify that this patient is under my care and that I had a face-to-face encounter that meets the physician face-to-face encounter requirements with this patient on this date. The encounter with the patient was in whole or in part for the following MEDICAL CONDITION: (primary reason for [place]) MEDICAL NECESSITY: I certify, that based on my findings, NURSING services are a medically necessary home health service. HOME BOUND STATUS: I certify that my clinical findings support that this patient is homebound (i.e., Due to illness or injury, pt requires aid of supportive devices such as crutches, cane, wheelchairs, walkers, the use of special transportation or the assistance of another person to leave their place of residence. There is a normal inability to leave the home and doing so requires considerable and taxing effort. Other absences are for medical reasons / religious services and are infrequent or of short duration when for other reasons). If current dressing causes regression in wound condition, may D/C ordered dressing product/s and apply Normal Saline Moist Dressing daily until next [place] / Other MD appointment. [place] of regression in wound condition at [phone]. Please direct any NON-WOUND related issues/requests for orders to patient's Primary Care Physician Wound #2 Left,Medial Lower  Leg: [place] for [place] to change Wednesdays and Fridays [place] Nurse may visit PRN to address patient s wound care  needs. WANELL, [place] ([id number]) FACE TO FACE ENCOUNTER: MEDICARE and MEDICAID PATIENTS: I certify that this patient is under my care and that I had a face-to-face encounter that meets the physician face-to-face encounter requirements with this patient on this date. The encounter with the patient was in whole or in part for the following MEDICAL CONDITION: (primary reason for [place]) MEDICAL NECESSITY: I certify, that based on my findings, NURSING services are a medically necessary home health service. HOME BOUND STATUS: I certify that my clinical findings support that this patient is homebound (i.e., Due to illness or injury, pt requires aid of supportive devices such as crutches, cane, wheelchairs, walkers, the use of special transportation or the assistance of another person to leave their place of residence. There is a normal inability to leave the home and doing so requires considerable and taxing effort. Other absences are for medical reasons / religious services and are infrequent or of short duration when for other reasons). If current dressing causes regression in wound condition, may D/C ordered dressing product/s and apply Normal Saline Moist Dressing daily until next [place] / Other MD appointment. [place] of regression in wound condition at [phone]. Please direct any NON-WOUND related issues/requests for orders to patient's Primary Care Physician Wound #3 Left,Medial Ankle: [place] for [place] to change Wednesdays and Fridays [place] Nurse may visit PRN to address patient s wound care needs. FACE TO FACE ENCOUNTER: MEDICARE and MEDICAID PATIENTS: I certify that this patient is under my care and that I had a face-to-face encounter that meets the physician face-to-face encounter requirements with this patient on this date. The encounter with the patient was  in whole or in part for the following MEDICAL CONDITION: (primary reason for [place]) MEDICAL NECESSITY: I certify, that based on my findings, NURSING services are a medically necessary home health service. HOME BOUND STATUS: I certify that my clinical findings support that this patient is homebound (i.e., Due to illness or injury, pt requires aid of supportive devices such as crutches, cane, wheelchairs, walkers, the use of special transportation or the assistance of another person to leave their place of residence. There is a normal inability to leave the home and doing so requires considerable and taxing effort. Other absences are for medical reasons / religious services and are infrequent or of short duration when for other reasons). If current dressing causes regression in wound condition, may D/C ordered dressing product/s and apply Normal Saline Moist Dressing daily until next [place] / Other MD appointment. [place] of regression in wound condition at [phone]. Please direct any NON-WOUND related issues/requests for orders to patient's Primary Care Physician Wound #4 Right Gluteus: [place] for [place] to change Wednesdays and Fridays [place] Nurse may visit PRN to address patient s wound care needs. FACE TO FACE ENCOUNTER: MEDICARE and MEDICAID PATIENTS: I certify that this patient is under my care and that I had a face-to-face encounter that meets the physician face-to-face encounter requirements with this patient on this date. The encounter with the patient was in whole or in part for the following MEDICAL CONDITION: (primary reason for [place])  MEDICAL NECESSITY: I certify, that based on my findings, NURSING services are a medically necessary home health service. HOME BOUND STATUS: I certify that my clinical findings support that this patient is homebound  (i.e., Due to illness or injury, pt requires aid of supportive devices such as crutches, cane, wheelchairs, walkers, the use of special transportation or the assistance of another person to leave their place of residence. There is a normal inability to leave the home and doing so requires considerable and taxing effort. Other absences are for medical reasons / religious services and are infrequent or of short duration when for other reasons). If current dressing causes regression in wound condition, may D/C ordered dressing product/s and apply Normal Saline Moist Dressing daily until next [place] / Other MD appointment. [place] of regression in wound condition at [phone]. Please direct any NON-WOUND related issues/requests for orders to patient's Primary Care Physician Wound #5 Left Toe Third: [place] for [place] to change Wednesdays and Fridays [place] Nurse may visit PRN to address patient s wound care needs. FACE TO FACE ENCOUNTER: MEDICARE and MEDICAID PATIENTS: I certify that this patient is under my care and that I had a face-to-face encounter that meets the physician face-to-face encounter requirements with this patient on this date. The encounter with the patient was in whole or in part for the following MEDICAL CONDITION: (primary reason for [place]) MEDICAL NECESSITY: I certify, that based on my findings, NURSING services are a medically necessary home health service. HOME BOUND STATUS: I certify that my clinical findings support that this patient is homebound (i.e., Due to illness or injury, pt requires aid of supportive devices such as crutches, cane, wheelchairs, walkers, the use of special transportation or the assistance of another person to leave their place of residence. There is a normal inability to leave the home and doing so requires considerable and  taxing effort. Other absences are for medical reasons / religious services and are infrequent or of short duration when for other reasons). MIKI, KNIESS ([id number]) If current dressing causes regression in wound condition, may D/C ordered dressing product/s and apply Normal Saline Moist Dressing daily until next [place] / Other MD appointment. [place] of regression in wound condition at [phone]. Please direct any NON-WOUND related issues/requests for orders to patient's Primary Care Physician 1. my suggestion at this point is good to be that we go ahead and continue with the current wound care measures with regard to most of the sites. This will include Iodoflex for the right first metatarsal region, alginate for the other wound locations, other than the gluteal region where she is just preferring to use zinc we will just allow her to do that at this point. 2. I am getting suggest as well that we go ahead and continue with the appropriate offloading in regard to the gluteus as well as her lower extremities in order to prevent any additional breakdown now that her arterial status is under better control hopefully she will not have new areas continue to show up as she has had up to this point. And we will get the areas currently present healed. We will see patient back for reevaluation in 1 week here in the clinic. If anything worsens or changes patient will contact our office for additional recommendations Electronic Signature(s) Signed: 05/08/2019 11:27:28 AM By: Worthy Keeler PA-C Previous Signature: 05/08/2019 10:25:50 AM  Version By: Worthy Keeler PA-C Entered By: Worthy Keeler on 05/08/2019 11:27:28 Sandy Roberson ([id number]) -------------------------------------------------------------------------------- SuperBill Details Patient Name: Sandy Roberson Date of Service: 05/08/2019 Medical Record Number: [id number] Patient Account  Number: [id number] Date of Birth/Sex: 1915/04/22 (86 y.o. F) Treating RN: Cornell Barman Primary Care Provider: Tracie Harrier Other Clinician: Referring Provider: Tracie Harrier Treating Provider/Extender: Melburn Hake, Charmaine Placido Weeks in Treatment: 5 Diagnosis Coding ICD-10 Codes Code Description I73.89 Other specified peripheral vascular diseases T81.31XA Disruption of external operation (surgical) wound, not elsewhere classified, initial encounter L97.513 Non-pressure chronic ulcer of other part of right foot with necrosis of muscle L89.312 Pressure ulcer of right buttock, stage 2 L97.821 Non-pressure chronic ulcer of other part of left lower leg limited to breakdown of skin R13.12 Dysphagia, oropharyngeal phase N18.30 Chronic kidney disease, stage 3 unspecified I50.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 (congestive) heart failure Physician Procedures CPT4: Description Modifier Quantity Code [id number] - WC PHYS LEVEL 3 - EST PT 1 ICD-10 Diagnosis Description I73.89 Other specified peripheral vascular diseases T81.31XA Disruption of external operation (surgical) wound, not elsewhere classified,  initial encounter L97.513 Non-pressure chronic ulcer of other part of right foot with necrosis of muscle L89.312 Pressure ulcer of right buttock, stage 2 Electronic Signature(s) Signed: 05/08/2019 10:26:22 AM By: Worthy Keeler PA-C Entered By: Worthy Keeler on 05/08/2019 10:26:21

## 2019-05-11 ENCOUNTER — Encounter: Payer: Medicare Other | Admitting: Physician Assistant

## 2019-05-11 NOTE — Progress Notes (Signed)
Sandy, Roberson ([id number]) Visit Report for 05/08/2019 Arrival Information Details Patient Name: Sandy Roberson, Sandy Roberson Date of Service: 05/08/2019 9:15 AM Medical Record Number: [id number] Patient Account Number: [id number] Date of Birth/Sex: Dec 03, 1915 (86 y.o. F) Treating RN: Cornell Barman Primary Care Seraj Dunnam: Tracie Harrier Other Clinician: Referring Ismar Yabut: Tracie Harrier Treating Meredith Kilbride/Extender: Melburn Hake, HOYT Weeks in Treatment: 5 Visit Information History Since Last Visit Added or deleted any medications: No Patient Arrived: Wheel Chair Any new allergies or adverse reactions: No Arrival Time: 09:15 Had a fall or experienced change in No Accompanied By: son activities of daily living that may affect Transfer Assistance: Manual risk of falls: Patient Identification Verified: Yes Signs or symptoms of abuse/neglect since last visito No Secondary Verification Process Yes Hospitalized since last visit: No Completed: Has Dressing in Place as Prescribed: Yes Patient Has Alerts: Yes Pain Present Now: No Patient Alerts: Patient on Blood Thinner aspirin 81 03/10/19 ABI L .77 R .67 TBI L .24 R .16 Electronic Signature(s) Signed: 05/08/2019 4:22:15 PM By: Lorine Bears RCP, RRT, CHT Entered By: Lorine Bears on 05/08/2019 09:17:19 Sandy Roberson ([id number]) -------------------------------------------------------------------------------- Clinic Level of Care Assessment Details Patient Name: Sandy Roberson Date of Service: 05/08/2019 9:15 AM Medical Record Number: [id number] Patient Account Number: [id number] Date of Birth/Sex: [birth year] (86 y.o. F) Treating RN: Cornell Barman Primary Care Corrissa Martello: Tracie Harrier Other Clinician: Referring Iris Tatsch: Tracie Harrier Treating Marbeth Smedley/Extender: Melburn Hake, HOYT Weeks in Treatment: 5 Clinic Level of Care Assessment Items TOOL 4 Quantity Score []  - Use when only an EandM is  performed on FOLLOW-UP visit 0 ASSESSMENTS - Nursing Assessment / Reassessment []  - Reassessment of Co-morbidities (includes updates in patient status) 0 X- 1 5 Reassessment of Adherence to Treatment Plan ASSESSMENTS - Wound and Skin Assessment / Reassessment []  - Simple Wound Assessment / Reassessment - one wound 0 X- 5 5 Complex Wound Assessment / Reassessment - multiple wounds []  - 0 Dermatologic / Skin Assessment (not related to wound area) ASSESSMENTS - Focused Assessment []  - Circumferential Edema Measurements - multi extremities 0 []  - 0 Nutritional Assessment / Counseling / Intervention []  - 0 Lower Extremity Assessment (monofilament, tuning fork, pulses) []  - 0 Peripheral Arterial Disease Assessment (using hand held doppler) ASSESSMENTS - Ostomy and/or Continence Assessment and Care []  - Incontinence Assessment and Management 0 []  - 0 Ostomy Care Assessment and Management (repouching, etc.) PROCESS - Coordination of Care X - Simple Patient / Family Education for ongoing care 1 15 []  - 0 Complex (extensive) Patient / Family Education for ongoing care []  - 0 Staff obtains Programmer, systems, Records, Test Results / Process Orders []  - 0 Staff telephones HHA, Nursing Homes / Clarify orders / etc []  - 0 Routine Transfer to another Facility (non-emergent condition) []  - 0 Routine Hospital Admission (non-emergent condition) []  - 0 New Admissions / Biomedical engineer / Ordering NPWT, Apligraf, etc. []  - 0 Emergency Hospital Admission (emergent condition) X- 1 10 Simple Discharge Coordination CLEATUS, ROZEBOOM ([id number]) []  - 0 Complex (extensive) Discharge Coordination PROCESS - Special Needs []  - Pediatric / Minor Patient Management 0 []  - 0 Isolation Patient Management []  - 0 Hearing / Language / Visual special needs []  - 0 Assessment of Community assistance (transportation, D/C planning, etc.) []  - 0 Additional assistance / Altered mentation []  - 0 Support  Surface(s) Assessment (bed, cushion, seat, etc.) INTERVENTIONS - Wound Cleansing / Measurement []  - Simple Wound Cleansing - one wound 0 X- 5 5 Complex Wound Cleansing - multiple wounds X- 1 5  Wound Imaging (photographs - any number of wounds) []  - 0 Wound Tracing (instead of photographs) []  - 0 Simple Wound Measurement - one wound X- 5 5 Complex Wound Measurement - multiple wounds INTERVENTIONS - Wound Dressings []  - Small Wound Dressing one or multiple wounds 0 []  - 0 Medium Wound Dressing one or multiple wounds X- 2 20 Large Wound Dressing one or multiple wounds []  - 0 Application of Medications - topical []  - 0 Application of Medications - injection INTERVENTIONS - Miscellaneous []  - External ear exam 0 []  - 0 Specimen Collection (cultures, biopsies, blood, body fluids, etc.) []  - 0 Specimen(s) / Culture(s) sent or taken to Lab for analysis []  - 0 Patient Transfer (multiple staff / Civil Service fast streamer / Similar devices) []  - 0 Simple Staple / Suture removal (25 or less) []  - 0 Complex Staple / Suture removal (26 or more) []  - 0 Hypo / Hyperglycemic Management (close monitor of Blood Glucose) []  - 0 Ankle / Brachial Index (ABI) - do not check if billed separately X- 1 5 Vital Signs Percifield, Prim ([id number]) Has the patient been seen at the hospital within the last three years: Yes Total Score: 155 Level Of Care: New/Established - Level 4 Electronic Signature(s) Signed: 05/08/2019 5:05:43 PM By: Gretta Cool, BSN, RN, CWS, Kim RN, BSN Entered By: Gretta Cool, BSN, RN, CWS, Kim on 05/08/2019 16:56:12 Sandy Roberson ([id number]) -------------------------------------------------------------------------------- Encounter Discharge Information Details Patient Name: Sandy Roberson Date of Service: 05/08/2019 9:15 AM Medical Record Number: [id number] Patient Account Number: [id number] Date of Birth/Sex: Mar 13, 1916 (86 y.o. F) Treating RN: Army Melia Primary Care Rieley Hausman:  Tracie Harrier Other Clinician: Referring Mariapaula Krist: Tracie Harrier Treating Rachyl Wuebker/Extender: Melburn Hake, HOYT Weeks in Treatment: 5 Encounter Discharge Information Items Discharge Condition: Stable Ambulatory Status: Ambulatory Discharge Destination: Home Transportation: Private Auto Accompanied By: son Schedule Follow-up Appointment: Yes Clinical Summary of Care: Electronic Signature(s) Signed: 05/08/2019 10:37:33 AM By: Army Melia Entered By: Army Melia on 05/08/2019 10:37:33 Sandy Roberson ([id number]) -------------------------------------------------------------------------------- Lower Extremity Assessment Details Patient Name: Sandy Roberson Date of Service: 05/08/2019 9:15 AM Medical Record Number: [id number] Patient Account Number: [id number] Date of Birth/Sex: 03-13-1916 (86 y.o. F) Treating RN: Cornell Barman Primary Care Kirti Carl: Tracie Harrier Other Clinician: Referring Madilyn Cephas: Tracie Harrier Treating Ashlee Player/Extender: Melburn Hake, HOYT Weeks in Treatment: 5 Vascular Assessment Pulses: Dorsalis Pedis Palpable: [Left:No] [Right:No] Doppler Audible: [Left:Inaudible] [Right:Inaudible] Posterior Tibial Palpable: [Left:No Yes] [Right:No Yes] Electronic Signature(s) Signed: 05/08/2019 5:05:43 PM By: Gretta Cool, BSN, RN, CWS, Kim RN, BSN Entered By: Gretta Cool, BSN, RN, CWS, Kim on 05/08/2019 10:02:04 Sandy Roberson ([id number]) -------------------------------------------------------------------------------- Multi Wound Chart Details Patient Name: Sandy Roberson Date of Service: 05/08/2019 9:15 AM Medical Record Number: [id number] Patient Account Number: [id number] Date of Birth/Sex: 02/14/1916 (86 y.o. 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 Venous Leg Ulcer Comorbid History: Anemia, Congestive Heart Anemia, Congestive Heart Anemia, Congestive Heart Failure, Coronary Artery Failure, Coronary Artery Failure, Coronary Artery Disease, Hypertension, Disease, Hypertension, Disease, Hypertension, Peripheral Arterial Disease, Peripheral Arterial Disease, Peripheral Arterial Disease, Peripheral Venous Disease, Peripheral Venous Disease, Peripheral Venous Disease, End Stage Renal Disease End Stage  Renal Disease End Stage Renal Disease Date Acquired: 03/16/2018 04/18/2019 04/18/2019 Weeks of Treatment: 5 2 2  Wound Status: Open Open Open Pending Amputation on Yes No No Presentation: Measurements L x W x D 3.2x3x1.5 4.5x5x0.1 0.7x1.1x0.2 (cm) Area (cm) : 7.54 17.671 0.605 Volume (cm) : 11.31 1.767 0.121 % Reduction in Area: 29.40% -75.80% 35.80% % Reduction in Volume: 47.10% -75.80% -28.70% Classification: Full Thickness With Exposed Partial Thickness Partial Thickness Support Structures Exudate Amount: Medium Large Large Exudate Type: Serous Serous Serous Exudate Color: amber amber amber Foul Odor After Cleansing: Yes No No Odor Anticipated Due to No N/A N/A Product Use: SHELVEY, STAEBELL ([id number]) Wound Margin: Flat and Intact Flat and Intact Flat and Intact Granulation Amount: Small (1-33%) Large (67-100%) Medium (34-66%) Granulation Quality: Pink Pink Pink Necrotic Amount: Large (67-100%) Small (1-33%) Medium (34-66%) Necrotic Tissue: Eschar, Adherent Slough N/A N/A Exposed Structures: Fat Layer (Subcutaneous Fat Layer (Subcutaneous Fat Layer (Subcutaneous Tissue)  Exposed: Yes Tissue) Exposed: Yes Tissue) Exposed: Yes Muscle: Yes Fascia: No Fascia: No Bone: Yes Tendon: No Tendon: No Fascia: No Muscle: No Muscle: No Tendon: No Joint: No Joint: No Joint: No Bone: No Bone: No Epithelialization: None Medium (34-66%) Small (1-33%) Wound Number: 4 5 N/A Photos: N/A Wound Location: Right Gluteus Left Toe Third N/A Wounding Event: Pressure Injury Gradually Appeared N/A Primary Etiology: Pressure Ulcer Arterial Insufficiency Ulcer N/A Comorbid History: Anemia, Congestive Heart Anemia, Congestive Heart N/A Failure, Coronary Artery Failure, Coronary Artery Disease, Hypertension, Disease, Hypertension, Peripheral Arterial Disease, Peripheral Arterial Disease, Peripheral Venous Disease, Peripheral Venous Disease, End Stage Renal Disease End Stage Renal Disease Date Acquired: 10/15/2018 04/17/2019 N/A Weeks of Treatment: 2 0 N/A Wound Status: Open Open N/A Pending Amputation on No No N/A Presentation: Measurements L x W x D 3.5x1.5x0.1 3x1.5x0.1 N/A (cm) Area (cm) : 4.123 3.534 N/A Volume (cm) : 0.412 0.353 N/A % Reduction in Area: 37.50% 0.00% N/A % Reduction in Volume: 37.60% 0.00% N/A Classification: Category/Stage II Unclassifiable N/A Exudate Amount: Medium Medium N/A Exudate Type: Serous Serous N/A Exudate Color: amber amber N/A Foul Odor After Cleansing: No No N/A Odor Anticipated Due to N/A N/A N/A Product Use: Wound Margin: Flat and Intact Indistinct, nonvisible N/A Granulation Amount: Small (1-33%) None Present (0%) N/A Granulation Quality: Pink N/A N/A Necrotic Amount: Medium (34-66%) Large (67-100%) N/A Necrotic Tissue: Adherent Slough Eschar N/A Exposed Structures: Fat Layer (Subcutaneous Fascia: No N/A Tissue) Exposed: Yes Fat Layer (Subcutaneous Quirino, Amnah ([id number]) Fascia: No Tissue) Exposed: No Tendon: No Tendon: No Muscle: No Muscle: No Joint: No Joint: No Bone: No Bone: No Epithelialization:  None None N/A Treatment Notes Electronic Signature(s) Signed: 05/11/2019 12:59:59 PM By: Army Melia Entered By: Army Melia on 05/08/2019 10:14:29 Sandy Roberson ([id number]) -------------------------------------------------------------------------------- [place] Details Patient Name: Sandy Roberson Date of Service: 05/08/2019 9:15 AM Medical Record Number: [id number] Patient Account Number: [id number] Date of Birth/Sex: [birth year] (86 y.o. F) Treating RN: Army Melia Primary Care Henritta Mutz: Tracie Harrier Other Clinician: Referring Jaime Dome: Tracie Harrier Treating Jhanae Jaskowiak/Extender: Melburn Hake, HOYT Weeks in Treatment: 5 Active Inactive Venous Leg Ulcer Nursing Diagnoses: Knowledge deficit related to disease process and management Goals: Patient/caregiver will verbalize understanding of disease process and disease management Date Initiated: 03/30/2019 Target Resolution Date: 03/30/2019 Goal Status: Active Interventions: Assess peripheral edema status every visit. Notes: Wound/Skin Impairment Nursing Diagnoses: Impaired tissue integrity Knowledge deficit related to ulceration/compromised skin integrity Goals: Ulcer/skin breakdown will have a volume reduction of 30% by week 4 Date Initiated: 03/30/2019 Target Resolution Date: 04/30/2019  Goal Status: Active Interventions: Assess patient/caregiver ability to obtain necessary supplies Assess patient/caregiver ability to perform ulcer/skin care regimen upon admission and as needed Assess ulceration(s) every visit Provide education on ulcer and skin care Notes: Electronic Signature(s) Signed: 05/11/2019 12:59:59 PM By: Army Melia Entered By: Army Melia on 05/08/2019 10:14:09 Sandy Roberson ([id number]) -------------------------------------------------------------------------------- Pain Assessment Details Patient Name: Sandy Roberson Date of Service: 05/08/2019 9:15  AM Medical Record Number: [id number] Patient Account Number: [id number] Date of Birth/Sex: 26-May-1915 (86 y.o. F) Treating RN: Cornell Barman Primary Care Annais Crafts: Tracie Harrier Other Clinician: Referring Aloysuis Ribaudo: Tracie Harrier Treating Valmai Vandenberghe/Extender: Melburn Hake, HOYT Weeks in Treatment: 5 Active Problems Location of Pain Severity and Description of Pain Patient Has Paino No Site Locations Pain Management and Medication Current Pain Management: Electronic Signature(s) Signed: 05/08/2019 4:22:15 PM By: Lorine Bears RCP, RRT, CHT Signed: 05/08/2019 5:05:43 PM By: Gretta Cool, BSN, RN, CWS, Kim RN, BSN Entered By: Lorine Bears on 05/08/2019 09:17:36 Sandy Roberson ([id number]) -------------------------------------------------------------------------------- Patient/Caregiver Education Details Patient Name: Sandy Roberson Date of Service: 05/08/2019 9:15 AM Medical Record Number: [id number] Patient Account Number: [id number] Date of Birth/Gender: 02-13-1916 (86 y.o. F) Treating RN: Army Melia Primary Care Physician: Tracie Harrier Other Clinician: Referring Physician: Tracie Harrier Treating Physician/Extender: Sharalyn Ink in Treatment: 5 Education Assessment Education Provided To: Patient Education Topics Provided Wound/Skin Impairment: Handouts: Caring for Your Ulcer Methods: Demonstration, Explain/Verbal Responses: State content correctly Electronic Signature(s) Signed: 05/11/2019 12:59:59 PM By: Army Melia Entered By: Army Melia on 05/08/2019 10:36:07 Sandy Roberson ([id number]) -------------------------------------------------------------------------------- Wound Assessment Details Patient Name: Sandy Roberson Date of Service: 05/08/2019 9:15 AM Medical Record Number: [id number] Patient Account Number: [id number] Date of Birth/Sex: [birth year] (86 y.o. F) Treating RN: Cornell Barman Primary Care Annalyce Lanpher:  Tracie Harrier Other Clinician: Referring Joaopedro Eschbach: Tracie Harrier Treating Onur Mori/Extender: Melburn Hake, HOYT Weeks in Treatment: 5 Wound Status Wound Number: 1 Primary Arterial Insufficiency Ulcer Etiology: Wound Location: Right Metatarsal head first - Medial Wound Open Wounding Event: Gradually Appeared Status: Date Acquired: 03/16/2018 Comorbid Anemia, Congestive Heart Failure, Coronary Weeks Of Treatment: 5 History: Artery Disease, Hypertension, Peripheral Arterial Clustered Wound: No Disease, Peripheral Venous Disease, End Stage Pending Amputation On Presentation Renal Disease Photos Wound Measurements Length: (cm) 3.2 Width: (cm) 3 Depth: (cm) 1.5 Area: (cm) 7.54 Volume: (cm) 11.31 % Reduction in Area: 29.4% % Reduction in Volume: 47.1% Epithelialization: None Wound Description Full Thickness With Exposed Support Classification: Structures Wound Margin: Flat and Intact Exudate Medium Amount: Exudate Type: Serous Exudate Color: amber Foul Odor After Cleansing: Yes Due to Product Use: No Slough/Fibrino Yes Wound Bed Granulation Amount: Small (1-33%) Exposed Structure Granulation Quality: Pink Fascia Exposed: No Necrotic Amount: Large (67-100%) Fat Layer (Subcutaneous Tissue) Exposed: Yes Necrotic Quality: Eschar, Adherent Slough Tendon Exposed: No Muscle Exposed: Yes Necrosis of Muscle: Yes Joint Exposed: No Buehner, Haani ([id number]) Bone Exposed: Yes Treatment Notes Wound #1 (Right, Medial Metatarsal head first) Notes iodoflex to right foot with abd and conform (zinc) periwound, silver ag to leg leg wounds and toe with conform, Zinc to sacrum Electronic Signature(s) Signed: 05/08/2019 5:05:43 PM By: Gretta Cool, BSN, RN, CWS, Kim RN, BSN Entered By: Gretta Cool, BSN, RN, CWS, Kim on 05/08/2019 09:55:26 Sandy Roberson ([id number]) -------------------------------------------------------------------------------- Wound Assessment  Details Patient Name: Sandy Roberson Date of Service: 05/08/2019 9:15 AM Medical Record Number: [id number] Patient Account Number: [id number] Date of Birth/Sex: 1916-03-31 (86 y.o. F) Treating RN: Cornell Barman Primary Care Tayveon Lombardo: Tracie Harrier Other Clinician: Referring Reema Chick: Tracie Harrier Treating Teryl Mcconaghy/Extender: Melburn Hake, HOYT Weeks  in Treatment: 5 Wound Status Wound Number: 2 Primary Venous Leg Ulcer Etiology: Wound Location: Left Lower Leg - Medial Wound Open Wounding Event: Blister Status: Date Acquired: 04/18/2019 Comorbid Anemia, Congestive Heart Failure, Coronary Weeks Of Treatment: 2 History: Artery Disease, Hypertension, Peripheral Arterial Clustered Wound: No Disease, Peripheral Venous Disease, End Stage Renal Disease Photos Wound Measurements Length: (cm) 4.5 % Reduction in Width: (cm) 5 % Reduction in Depth: (cm) 0.1 Epithelializat Area: (cm) 17.671 Volume: (cm) 1.767 Area: -75.8% Volume: -75.8% ion: Medium (34-66%) Wound Description Classification: Partial Thickness Foul Odor Afte Wound Margin: Flat and Intact Slough/Fibrino Exudate Amount: Large Exudate Type: Serous Exudate Color: amber r Cleansing: No No Wound Bed Granulation Amount: Large (67-100%) Exposed Structure Granulation Quality: Pink Fascia Exposed: No Necrotic Amount: Small (1-33%) Fat Layer (Subcutaneous Tissue) Exposed: Yes Tendon Exposed: No Muscle Exposed: No Joint Exposed: No Bone Exposed: No [place], [place] ([id number]) Treatment Notes Wound #2 (Left, Medial Lower Leg) Notes iodoflex to right foot with abd and conform (zinc) periwound, silver ag to leg leg wounds and toe with conform, Zinc to sacrum Electronic Signature(s) Signed: 05/08/2019 5:05:43 PM By: Gretta Cool, BSN, RN, CWS, Kim RN, BSN Entered By: Gretta Cool, BSN, RN, CWS, Kim on 05/08/2019 09:56:36 Sandy Roberson  ([id number]) -------------------------------------------------------------------------------- Wound Assessment Details Patient Name: Sandy Roberson Date of Service: 05/08/2019 9:15 AM Medical Record Number: [id number] Patient Account Number: [id number] Date of Birth/Sex: [birth year] (86 y.o. F) Treating RN: Cornell Barman Primary Care Shepherd Finnan: Tracie Harrier Other Clinician: Referring Alfred Harrel: Tracie Harrier Treating Keylen Eckenrode/Extender: Melburn Hake, HOYT Weeks in Treatment: 5 Wound Status Wound Number: 3 Primary Venous Leg Ulcer Etiology: Wound Location: Left Ankle - Medial Wound Open Wounding Event: Blister Status: Date Acquired: 04/18/2019 Comorbid Anemia, Congestive Heart Failure, Coronary Weeks Of Treatment: 2 History: Artery Disease, Hypertension, Peripheral Arterial Clustered Wound: No Disease, Peripheral Venous Disease, End Stage Renal Disease Photos Wound Measurements Length: (cm) 0.7 % Reduction in Width: (cm) 1.1 % Reduction in Depth: (cm) 0.2 Epithelializat Area: (cm) 0.605 Volume: (cm) 0.121 Area: 35.8% Volume: -28.7% ion: Small (1-33%) Wound Description Classification: Partial Thickness Foul Odor Afte Wound Margin: Flat and Intact Slough/Fibrino Exudate Amount: Large Exudate Type: Serous Exudate Color: amber r Cleansing: No No Wound Bed Granulation Amount: Medium (34-66%) Exposed Structure Granulation Quality: Pink Fascia Exposed: No Necrotic Amount: Medium (34-66%) Fat Layer (Subcutaneous Tissue) Exposed: Yes Tendon Exposed: No Muscle Exposed: No Joint Exposed: No Bone Exposed: No Panella, Odilia ([id number]) Treatment Notes Wound #3 (Left, Medial Ankle) Notes iodoflex to right foot with abd and conform (zinc) periwound, silver ag to leg leg wounds and toe with conform, Zinc to sacrum Electronic Signature(s) Signed: 05/08/2019 5:05:43 PM By: Gretta Cool, BSN, RN, CWS, Kim RN, BSN Entered By: Gretta Cool, BSN, RN, CWS, Kim on 05/08/2019  09:57:09 Sandy Roberson ([id number]) -------------------------------------------------------------------------------- Wound Assessment Details Patient Name: Sandy Roberson Date of Service: 05/08/2019 9:15 AM Medical Record Number: [id number] Patient Account Number: [id number] Date of Birth/Sex: 06-Mar-1916 (86 y.o. F) Treating RN: Cornell Barman Primary Care Macyn Remmert: Tracie Harrier Other Clinician: Referring Annah Jasko: Tracie Harrier Treating Hafiz Irion/Extender: Melburn Hake, HOYT Weeks in Treatment: 5 Wound Status Wound Number: 4 Primary Pressure Ulcer Etiology: Wound Location: Right Gluteus Wound Open Wounding Event: Pressure Injury Status: Date Acquired: 10/15/2018 Comorbid Anemia, Congestive Heart Failure, Coronary Weeks Of Treatment: 2 History: Artery Disease, Hypertension, Peripheral Arterial Clustered Wound: No Disease, Peripheral Venous Disease, End Stage Renal Disease Photos Wound Measurements Length: (cm) 3.5 % Reduction in Width: (cm) 1.5 % Reduction in Depth: (cm) 0.1 Epithelializati Area: (cm)  4.123 Volume: (cm) 0.412 Area: 37.5% Volume: 37.6% on: None Wound Description Classification: Category/Stage II Foul Odor After Wound Margin: Flat and Intact Slough/Fibrino Exudate Amount: Medium Exudate Type: Serous Exudate Color: amber Cleansing: No Yes Wound Bed Granulation Amount: Small (1-33%) Exposed Structure Granulation Quality: Pink Fascia Exposed: No Necrotic Amount: Medium (34-66%) Fat Layer (Subcutaneous Tissue) Exposed: Yes Necrotic Quality: Adherent Slough Tendon Exposed: No Muscle Exposed: No Joint Exposed: No Bone Exposed: No Couey, Kamaria ([id number]) Treatment Notes Wound #4 (Right Gluteus) Notes iodoflex to right foot with abd and conform (zinc) periwound, silver ag to leg leg wounds and toe with conform, Zinc to sacrum Electronic Signature(s) Signed: 05/08/2019 5:05:43 PM By: Gretta Cool, BSN, RN, CWS, Kim RN, BSN Entered By:  Gretta Cool, BSN, RN, CWS, Kim on 05/08/2019 09:58:27 Sandy Roberson ([id number]) -------------------------------------------------------------------------------- Wound Assessment Details Patient Name: Sandy Roberson Date of Service: 05/08/2019 9:15 AM Medical Record Number: [id number] Patient Account Number: [id number] Date of Birth/Sex: July 20, 1915 (86 y.o. F) Treating RN: Cornell Barman Primary Care Ylonda Storr: Tracie Harrier Other Clinician: Referring Porter Moes: Tracie Harrier Treating Abdoul Encinas/Extender: Melburn Hake, HOYT Weeks in Treatment: 5 Wound Status Wound Number: 5 Primary Arterial Insufficiency Ulcer Etiology: Wound Location: Left Toe Third Wound Open Wounding Event: Gradually Appeared Status: Date Acquired: 04/17/2019 Comorbid Anemia, Congestive Heart Failure, Coronary Weeks Of Treatment: 0 History: Artery Disease, Hypertension, Peripheral Arterial Clustered Wound: No Disease, Peripheral Venous Disease, End Stage Renal Disease Photos Wound Measurements Length: (cm) 3 % Reduction i Width: (cm) 1.5 % Reduction i Depth: (cm) 0.1 Epithelializa Area: (cm) 3.534 Tunneling: Volume: (cm) 0.353 Undermining: n Area: 0% n Volume: 0% tion: None No No Wound Description Classification: Unclassifiable Foul Odor Aft Wound Margin: Indistinct, nonvisible Slough/Fibrin Exudate Amount: Medium Exudate Type: Serous Exudate Color: amber er Cleansing: No o No Wound Bed Granulation Amount: None Present (0%) Exposed Structure Necrotic Amount: Large (67-100%) Fascia Exposed: No Necrotic Quality: Eschar Fat Layer (Subcutaneous Tissue) Exposed: No Tendon Exposed: No Muscle Exposed: No Joint Exposed: No Bone Exposed: No Harman, Allicia ([id number]) Treatment Notes Wound #5 (Left Toe Third) Notes iodoflex to right foot with abd and conform (zinc) periwound, silver ag to leg leg wounds and toe with conform, Zinc to sacrum Electronic Signature(s) Signed: 05/08/2019  5:05:43 PM By: Gretta Cool, BSN, RN, CWS, Kim RN, BSN Entered By: Gretta Cool, BSN, RN, CWS, Kim on 05/08/2019 09:59:22 Sandy Roberson ([id number]) -------------------------------------------------------------------------------- [place] Details Patient Name: Sandy Roberson Date of Service: 05/08/2019 9:15 AM Medical Record Number: [id number] Patient Account Number: [id number] Date of Birth/Sex: Jan 21, 1916 (86 y.o. F) Treating RN: Cornell Barman Primary Care Kynlei Piontek: Tracie Harrier Other Clinician: Referring Metzli Pollick: Tracie Harrier Treating Tamma Brigandi/Extender: Melburn Hake, HOYT Weeks in Treatment: 5 Vital Signs Time Taken: 09:17 Temperature (F): 96.9 Height (in): 63 Pulse (bpm): 84 Weight (lbs): 118 Respiratory Rate (breaths/min): 16 Body Mass Index (BMI): 20.9 Blood Pressure (mmHg): 102/58 Reference Range: 80 - 120 mg / dl Electronic Signature(s) Signed: 05/08/2019 4:22:15 PM By: Lorine Bears RCP, RRT, CHT Entered By: Lorine Bears on 05/08/2019 09:18:13

## 2019-05-15 ENCOUNTER — Other Ambulatory Visit: Payer: Self-pay

## 2019-05-15 ENCOUNTER — Encounter: Payer: Medicare Other | Admitting: Physician Assistant

## 2019-05-15 DIAGNOSIS — L97821 Non-pressure chronic ulcer of other part of left lower leg limited to breakdown of skin: Secondary | ICD-10-CM | POA: Diagnosis not present

## 2019-05-15 NOTE — Progress Notes (Addendum)
FRANCYNE, KAZAN ([id number]) Visit Report for 05/15/2019 Arrival Information Details Patient Name: Sandy Roberson, Sandy Roberson Date of Service: 05/15/2019 10:30 AM Medical Record Number: [id number] Patient Account Number: [id number] Date of Birth/Sex: [birth year] (86 y.o. F) Treating RN: Army Melia Primary Care Nicole Hafley: Tracie Harrier Other Clinician: Referring Narissa Beaufort: Tracie Harrier Treating Obryan Radu/Extender: Melburn Hake, HOYT Weeks in Treatment: 6 Visit Information History Since Last Visit Added or deleted any medications: No Patient Arrived: Wheel Chair Any new allergies or adverse reactions: No Arrival Time: 10:46 Had a fall or experienced change in No Accompanied By: son activities of daily living that may affect Transfer Assistance: None risk of falls: Patient Identification Verified: Yes Signs or symptoms of abuse/neglect since last visito No Patient Has Alerts: Yes Hospitalized since last visit: No Patient Alerts: Patient on Blood Thinner Has Dressing in Place as Prescribed: Yes aspirin 81 Pain Present Now: No 03/10/19 ABI L .77 R .67 TBI L .24 R .16 Electronic Signature(s) Signed: 05/15/2019 12:27:29 PM By: Army Melia Entered By: Army Melia on 05/15/2019 10:47:02 Sandy Roberson ([id number]) -------------------------------------------------------------------------------- Lower Extremity Assessment Details Patient Name: Sandy Roberson Date of Service: 05/15/2019 10:30 AM Medical Record Number: [id number] Patient Account Number: [id number] Date of Birth/Sex: 11-27-1915 (86 y.o. F) Treating RN: Army Melia Primary Care Ziyana Morikawa: Tracie Harrier Other Clinician: Referring Jonee Lamore: Tracie Harrier Treating Jayshaun Phillips/Extender: STONE III, HOYT Weeks in Treatment: 6 Edema Assessment Assessed: [Left: No] [Right: No] Edema: [Left: No] [Right: No] Vascular Assessment Pulses: Dorsalis Pedis Palpable: [Left:Yes] [Right:Yes] Electronic  Signature(s) Signed: 05/15/2019 12:27:29 PM By: Army Melia Entered By: Army Melia on 05/15/2019 11:00:08 Sandy Roberson ([id number]) -------------------------------------------------------------------------------- Multi Wound Chart Details Patient Name: Sandy Roberson Date of Service: 05/15/2019 10:30 AM Medical Record Number: [id number] Patient Account Number: [id number] Date of Birth/Sex: Aug 17, 1915 (86 y.o. 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 Venous Leg Ulcer Comorbid History: Anemia, Congestive Heart Anemia, Congestive Heart Anemia, Congestive Heart Failure, Coronary Artery Failure, Coronary Artery Failure, Coronary Artery Disease, Hypertension, Disease, Hypertension, Disease, Hypertension, Peripheral Arterial Disease, Peripheral Arterial Disease, Peripheral Arterial Disease, Peripheral Venous Disease, Peripheral Venous Disease, Peripheral Venous Disease, End Stage Renal Disease End Stage Renal Disease End Stage Renal Disease Date Acquired: 03/16/2018 04/18/2019 04/18/2019 Weeks of Treatment: 6 3 3  Wound Status: Open Open Open Pending Amputation on Yes No No Presentation: Measurements L x W x D 3.5x2.5x1.5 4.4x4.2x0.1 0.6x0.5x0.2 (cm) Area (cm) : 6.872 14.514 0.236 Volume (cm) : 10.308 1.451 0.047 % Reduction in Area: 35.70% -44.40% 74.90% % Reduction in Volume: 51.70% -44.40% 50.00% Classification: Full Thickness With  Exposed Partial Thickness Partial Thickness Support Structures Exudate Amount: Medium Large Large Exudate Type: Serous Serous Serous Exudate Color: amber amber amber Foul Odor After Cleansing: Yes No No Odor Anticipated Due to No N/A N/A Product Use: MILAGRO, WEISBECKER ([id number]) Wound Margin: Flat and Intact Flat and Intact Flat and Intact Granulation Amount: Small (1-33%) Large (67-100%) Medium (34-66%) Granulation Quality: Pink Pink Pink Necrotic Amount: Large (67-100%) Small (1-33%) Medium (34-66%) Necrotic Tissue: Eschar, Adherent Slough N/A N/A Exposed Structures: Fat Layer (Subcutaneous Fat Layer (Subcutaneous Fat Layer (Subcutaneous Tissue) Exposed: Yes Tissue) Exposed:  Yes Tissue) Exposed: Yes Muscle: Yes Fascia: No Fascia: No Bone: Yes Tendon: No Tendon: No Fascia: No Muscle: No Muscle: No Tendon: No Joint: No Joint: No Joint: No Bone: No Bone: No Epithelialization: None Medium (34-66%) Small (1-33%) Wound Number: 4 5 N/A Photos: N/A Wound Location: Right Gluteus Left Toe Third N/A Wounding Event: Pressure Injury Gradually Appeared N/A Primary Etiology: Pressure Ulcer Arterial Insufficiency Ulcer N/A Comorbid History: Anemia, Congestive Heart Anemia, Congestive Heart N/A Failure, Coronary Artery Failure, Coronary Artery Disease, Hypertension, Disease, Hypertension, Peripheral Arterial Disease, Peripheral Arterial Disease, Peripheral Venous Disease, Peripheral Venous Disease, End Stage Renal Disease End Stage Renal Disease Date Acquired: 10/15/2018 04/17/2019 N/A Weeks of Treatment: 3 1 N/A Wound Status: Open Open N/A Pending Amputation on No No N/A Presentation: Measurements L x W x D 1.2x1.5x0.1 0.1x0.1x0.1 N/A (cm) Area (cm) : 1.414 0.008 N/A Volume (cm) : 0.141 0.001 N/A % Reduction in Area: 78.60% 99.80% N/A % Reduction in Volume: 78.60% 99.70% N/A Classification: Category/Stage II Unclassifiable N/A Exudate Amount: Medium Medium N/A Exudate  Type: Serous Serous N/A Exudate Color: amber amber N/A Foul Odor After Cleansing: No No N/A Odor Anticipated Due to N/A N/A N/A Product Use: Wound Margin: Flat and Intact Indistinct, nonvisible N/A Granulation Amount: Large (67-100%) None Present (0%) N/A Granulation Quality: Pink N/A N/A Necrotic Amount: None Present (0%) Large (67-100%) N/A Necrotic Tissue: N/A Eschar N/A Exposed Structures: Fat Layer (Subcutaneous Fascia: No N/A Tissue) Exposed: Yes Fat Layer (Subcutaneous Weisbecker, Rosabel ([id number]) Fascia: No Tissue) Exposed: No Tendon: No Tendon: No Muscle: No Muscle: No Joint: No Joint: No Bone: No Bone: No Epithelialization: None None N/A Treatment Notes Electronic Signature(s) Signed: 05/15/2019 12:27:29 PM By: Army Melia Entered By: Army Melia on 05/15/2019 11:11:11 Sandy Roberson ([id number]) -------------------------------------------------------------------------------- [place] Details Patient Name: Sandy Roberson Date of Service: 05/15/2019 10:30 AM Medical Record Number: [id number] Patient Account Number: [id number] Date of Birth/Sex: 01-06-1916 (86 y.o. F) Treating RN: Army Melia Primary Care Calyse Murcia: Tracie Harrier Other Clinician: Referring Keidy Thurgood: Tracie Harrier Treating Abbigale Mcelhaney/Extender: Melburn Hake, HOYT Weeks in Treatment: 6 Active Inactive Venous Leg Ulcer Nursing Diagnoses: Knowledge deficit related to disease process and management Goals: Patient/caregiver will verbalize understanding of disease process and disease management Date Initiated: 03/30/2019 Target Resolution Date: 03/30/2019 Goal Status: Active Interventions: Assess peripheral edema status every visit. Notes: Wound/Skin Impairment Nursing Diagnoses: Impaired tissue integrity Knowledge deficit related to ulceration/compromised skin integrity Goals: Ulcer/skin breakdown will have a volume reduction of 30% by week 4 Date  Initiated: 03/30/2019 Target Resolution Date: 04/30/2019 Goal Status: Active Interventions: Assess patient/caregiver ability to obtain necessary supplies Assess patient/caregiver ability to perform ulcer/skin care regimen upon admission and as needed Assess ulceration(s) every visit Provide education on ulcer and skin care Notes: Electronic Signature(s) Signed: 05/15/2019 12:27:29 PM By: Army Melia Entered By: Army Melia on 05/15/2019 11:10:37 Sandy Roberson ([id number]) -------------------------------------------------------------------------------- Pain Assessment Details Patient Name: Sandy Roberson Date of Service: 05/15/2019 10:30 AM Medical Record Number: [id number] Patient Account Number: [id number] Date of Birth/Sex: [birth year] (87 y.o. F) Treating RN: Army Melia Primary Care Darene Nappi: Tracie Harrier Other Clinician: Referring Masayuki Sakai: Tracie Harrier Treating Correll Denbow/Extender: Melburn Hake, HOYT Weeks in Treatment: 6 Active Problems Location of Pain Severity and Description of Pain Patient Has Paino No Site Locations Pain Management and Medication Current Pain Management: Electronic Signature(s) Signed: 05/15/2019 12:27:29 PM By: Army Melia Entered By: Army Melia on 05/15/2019 10:47:07 Sandy Roberson ([id number]) -------------------------------------------------------------------------------- Patient/Caregiver Education Details Patient Name: Sandy Roberson Date of Service: 05/15/2019 10:30 AM Medical  Record Number: [id number] Patient Account Number: [id number] Date of Birth/Gender: 25-May-1915 (86 y.o. F) Treating RN: Army Melia Primary Care Physician: Tracie Harrier Other Clinician: Referring Physician: Tracie Harrier Treating Physician/Extender: Sharalyn Ink in Treatment: 6 Education Assessment Education Provided To: Patient Education Topics Provided Wound/Skin Impairment: Handouts: Caring for Your Ulcer Methods:  Demonstration, Explain/Verbal Responses: State content correctly Electronic Signature(s) Signed: 05/15/2019 12:27:29 PM By: Army Melia Entered By: Army Melia on 05/15/2019 11:46:04 Sandy Roberson ([id number]) -------------------------------------------------------------------------------- Wound Assessment Details Patient Name: Sandy Roberson Date of Service: 05/15/2019 10:30 AM Medical Record Number: [id number] Patient Account Number: [id number] Date of Birth/Sex: [birth year] (86 y.o. F) Treating RN: Army Melia Primary Care Decoda Van: Tracie Harrier Other Clinician: Referring Asucena Galer: Tracie Harrier Treating Lanijah Warzecha/Extender: Melburn Hake, HOYT Weeks in Treatment: 6 Wound Status Wound Number: 1 Primary Arterial Insufficiency Ulcer Etiology: Wound Location: Right Metatarsal head first - Medial Wound Open Wounding Event: Gradually Appeared Status: Date Acquired: 03/16/2018 Comorbid Anemia, Congestive Heart Failure, Coronary Weeks Of Treatment: 6 History: Artery Disease, Hypertension, Peripheral Arterial Clustered Wound: No Disease, Peripheral Venous Disease, End Stage Pending Amputation On Presentation Renal Disease Photos Wound Measurements Length: (cm) 3.5 Width: (cm) 2.5 Depth: (cm) 1.5 Area: (cm) 6.872 Volume: (cm) 10.308 % Reduction in Area: 35.7% % Reduction in Volume: 51.7% Epithelialization: None Tunneling: No Undermining: No Wound Description Full Thickness With Exposed Support Classification: Structures Wound Margin: Flat and Intact Exudate Medium Amount: Exudate Type: Serous Exudate Color: amber Foul Odor After Cleansing: Yes Due to Product Use: No Slough/Fibrino Yes Wound Bed Granulation Amount: Small (1-33%) Exposed Structure Granulation Quality: Pink Fascia Exposed: No Necrotic Amount: Large (67-100%) Fat Layer (Subcutaneous Tissue) Exposed: Yes Necrotic Quality: Eschar, Adherent Slough Tendon Exposed: No Muscle Exposed:  Yes Necrosis of Muscle: Yes Joint Exposed: No Hayner, Rosalynn ([id number]) Bone Exposed: Yes Electronic Signature(s) Signed: 05/15/2019 12:27:29 PM By: Army Melia Entered By: Army Melia on 05/15/2019 10:58:19 Sandy Roberson ([id number]) -------------------------------------------------------------------------------- Wound Assessment Details Patient Name: Sandy Roberson Date of Service: 05/15/2019 10:30 AM Medical Record Number: [id number] Patient Account Number: [id number] Date of Birth/Sex: 02/14/1916 (86 y.o. F) Treating RN: Army Melia Primary Care Nefi Musich: Tracie Harrier Other Clinician: Referring Naif Alabi: Tracie Harrier Treating Marianita Botkin/Extender: Melburn Hake, HOYT Weeks in Treatment: 6 Wound Status Wound Number: 2 Primary Venous Leg Ulcer Etiology: Wound Location: Left Lower Leg - Medial Wound Open Wounding Event: Blister Status: Date Acquired: 04/18/2019 Comorbid Anemia, Congestive Heart Failure, Coronary Weeks Of Treatment: 3 History: Artery Disease, Hypertension, Peripheral Arterial Clustered Wound: No Disease, Peripheral Venous Disease, End Stage Renal Disease Photos Wound Measurements Length: (cm) 4.4 % Reduction in Width: (cm) 4.2 % Reduction in Depth: (cm) 0.1 Epithelializat Area: (cm) 14.514 Tunneling: Volume: (cm) 1.451 Undermining: Area: -44.4% Volume: -44.4% ion: Medium (34-66%) No No Wound Description Classification: Partial Thickness Foul Odor Afte Wound Margin: Flat and Intact Slough/Fibrino Exudate Amount: Large Exudate Type: Serous Exudate Color: amber r Cleansing: No No Wound Bed Granulation Amount: Large (67-100%) Exposed Structure Granulation Quality: Pink Fascia Exposed: No Necrotic Amount: Small (1-33%) Fat Layer (Subcutaneous Tissue) Exposed: Yes Tendon Exposed: No Muscle Exposed: No Joint Exposed: No Bone Exposed: No VALERIA, GRICE ([id number]) Electronic Signature(s) Signed: 05/15/2019 12:27:29  PM By: Army Melia Entered By: Army Melia on 05/15/2019 10:58:38 Sandy Roberson ([id number]) -------------------------------------------------------------------------------- Wound Assessment Details Patient Name: Sandy Roberson Date of Service: 05/15/2019 10:30 AM Medical Record Number: [id number] Patient Account Number: [id number] Date of Birth/Sex: 26-Apr-1915 (86 y.o. F) Treating RN: Army Melia Primary Care Cap Massi: Tracie Harrier Other Clinician: Referring Jeannett Dekoning:  Hande, Vishwanath Treating Rosaly Labarbera/Extender: STONE III, HOYT Weeks in Treatment: 6 Wound Status Wound Number: 3 Primary Venous Leg Ulcer Etiology: Wound Location: Left Ankle - Medial Wound Open Wounding Event: Blister Status: Date Acquired: 04/18/2019 Comorbid Anemia, Congestive Heart Failure, Coronary Weeks Of Treatment: 3 History: Artery Disease, Hypertension, Peripheral Arterial Clustered Wound: No Disease, Peripheral Venous Disease, End Stage Renal Disease Photos Wound Measurements Length: (cm) 0.6 % Reduction in Width: (cm) 0.5 % Reduction in Depth: (cm) 0.2 Epithelializat Area: (cm) 0.236 Volume: (cm) 0.047 Area: 74.9% Volume: 50% ion: Small (1-33%) Wound Description Classification: Partial Thickness Foul Odor Afte Wound Margin: Flat and Intact Slough/Fibrino Exudate Amount: Large Exudate Type: Serous Exudate Color: amber r Cleansing: No No Wound Bed Granulation Amount: Medium (34-66%) Exposed Structure Granulation Quality: Pink Fascia Exposed: No Necrotic Amount: Medium (34-66%) Fat Layer (Subcutaneous Tissue) Exposed: Yes Tendon Exposed: No Muscle Exposed: No Joint Exposed: No Bone Exposed: No MARYIA, CANARD ([id number]) Electronic Signature(s) Signed: 05/15/2019 12:27:29 PM By: Army Melia Entered By: Army Melia on 05/15/2019 10:59:03 Sandy Roberson ([id number]) -------------------------------------------------------------------------------- Wound  Assessment Details Patient Name: Sandy Roberson Date of Service: 05/15/2019 10:30 AM Medical Record Number: [id number] Patient Account Number: [id number] Date of Birth/Sex: 1915/07/26 (86 y.o. F) Treating RN: Army Melia Primary Care Maninder Deboer: Tracie Harrier Other Clinician: Referring Kimberly Nieland: Tracie Harrier Treating Benedicto Capozzi/Extender: Melburn Hake, HOYT Weeks in Treatment: 6 Wound Status Wound Number: 4 Primary Pressure Ulcer Etiology: Wound Location: Right Gluteus Wound Open Wounding Event: Pressure Injury Status: Date Acquired: 10/15/2018 Comorbid Anemia, Congestive Heart Failure, Coronary Weeks Of Treatment: 3 History: Artery Disease, Hypertension, Peripheral Arterial Clustered Wound: No Disease, Peripheral Venous Disease, End Stage Renal Disease Photos Wound Measurements Length: (cm) 1.2 % Reduction in Width: (cm) 1.5 % Reduction in Depth: (cm) 0.1 Epithelializat Area: (cm) 1.414 Volume: (cm) 0.141 Area: 78.6% Volume: 78.6% ion: None Wound Description Classification: Category/Stage II Foul Odor Afte Wound Margin: Flat and Intact Slough/Fibrino Exudate Amount: Medium Exudate Type: Serous Exudate Color: amber r Cleansing: No Yes Wound Bed Granulation Amount: Large (67-100%) Exposed Structure Granulation Quality: Pink Fascia Exposed: No Necrotic Amount: None Present (0%) Fat Layer (Subcutaneous Tissue) Exposed: Yes Tendon Exposed: No Muscle Exposed: No Joint Exposed: No Bone Exposed: No BAREERAH, AMRHEIN ([id number]) Electronic Signature(s) Signed: 05/15/2019 12:27:29 PM By: Army Melia Entered By: Army Melia on 05/15/2019 10:59:24 Sandy Roberson ([id number]) -------------------------------------------------------------------------------- Wound Assessment Details Patient Name: Sandy Roberson Date of Service: 05/15/2019 10:30 AM Medical Record Number: [id number] Patient Account Number: [id number] Date of Birth/Sex: March 06, 1916 (86 y.o.  F) Treating RN: Army Melia Primary Care Corby Vandenberghe: Tracie Harrier Other Clinician: Referring Zayde Stroupe: Tracie Harrier Treating Deontrey Massi/Extender: Melburn Hake, HOYT Weeks in Treatment: 6 Wound Status Wound Number: 5 Primary Arterial Insufficiency Ulcer Etiology: Wound Location: Left Toe Third Wound Open Wounding Event: Gradually Appeared Status: Date Acquired: 04/17/2019 Comorbid Anemia, Congestive Heart Failure, Coronary Weeks Of Treatment: 1 History: Artery Disease, Hypertension, Peripheral Arterial Clustered Wound: No Disease, Peripheral Venous Disease, End Stage Renal Disease Photos Wound Measurements Length: (cm) 0.1 % Reduction i Width: (cm) 0.1 % Reduction i Depth: (cm) 0.1 Epithelializa Area: (cm) 0.008 Tunneling: Volume: (cm) 0.001 Undermining: n Area: 99.8% n Volume: 99.7% tion: None No No Wound Description Classification: Unclassifiable Foul Odor Aft Wound Margin: Indistinct, nonvisible Slough/Fibrin Exudate Amount: Medium Exudate Type: Serous Exudate Color: amber er Cleansing: No o No Wound Bed Granulation Amount: None Present (0%) Exposed Structure Necrotic Amount: Large (67-100%) Fascia Exposed: No Necrotic Quality: Eschar Fat Layer (Subcutaneous Tissue) Exposed: No Tendon Exposed: No Muscle Exposed:  No Joint Exposed: No Bone Exposed: No TRYSTIN, ETTEN ([id number]) Electronic Signature(s) Signed: 05/15/2019 12:27:29 PM By: Army Melia Entered By: Army Melia on 05/15/2019 10:59:53 Sandy Roberson ([id number]) -------------------------------------------------------------------------------- [place] Details Patient Name: Sandy Roberson Date of Service: 05/15/2019 10:30 AM Medical Record Number: [id number] Patient Account Number: [id number] Date of Birth/Sex: May 29, 1915 (86 y.o. F) Treating RN: Army Melia Primary Care Lorijean Husser: Tracie Harrier Other Clinician: Referring Hajra Port: Tracie Harrier Treating  Mirtie Bastyr/Extender: Melburn Hake, HOYT Weeks in Treatment: 6 Vital Signs Time Taken: 10:47 Temperature (F): 97.5 Height (in): 63 Pulse (bpm): 99 Weight (lbs): 118 Respiratory Rate (breaths/min): 16 Body Mass Index (BMI): 20.9 Blood Pressure (mmHg): 136/57 Reference Range: 80 - 120 mg / dl Electronic Signature(s) Signed: 05/15/2019 12:27:29 PM By: Army Melia Entered By: Army Melia on 05/15/2019 10:47:23

## 2019-05-15 NOTE — Progress Notes (Addendum)
Sandy Roberson, Sandy Roberson ([id number]) Visit Report for 05/15/2019 Chief Complaint Document Details Patient Name: Sandy Roberson, Sandy Roberson Date of Service: 05/15/2019 10:30 AM Medical Record Number: [id number] Patient Account Number: [id number] Date of Birth/Sex: 1915-07-10 (86 y.o. F) Treating RN: Cornell Barman Primary Care Provider: Tracie Harrier Other Clinician: Referring Provider: Tracie Harrier Treating Provider/Extender: Melburn Hake, Shanesha Bednarz Weeks in Treatment: 6 Information Obtained from: Patient Chief Complaint Right medial foot surgical ulcer, Left LE ulcers, and right gluteal ulcer Electronic Signature(s) Signed: 05/15/2019 10:39:24 AM By: Worthy Keeler PA-C Entered By: Worthy Keeler on 05/15/2019 10:39:24 Sandy Roberson ([id number]) -------------------------------------------------------------------------------- Debridement Details Patient Name: Sandy Roberson Date of Service: 05/15/2019 10:30 AM Medical Record Number: [id number] Patient Account Number: [id number] Date of Birth/Sex: June 27, 1915 (86 y.o. F) Treating RN: Army Melia Primary Care Provider: Tracie Harrier Other Clinician: Referring Provider: Tracie Harrier Treating Provider/Extender: Melburn Hake, Jonte Shiller Weeks in Treatment: 6 Debridement Performed for Wound #5 Left Toe Third Assessment: Performed By: Physician STONE III, Christle Nolting E., PA-C Debridement Type: Debridement Severity of Tissue Pre Bone involvement without necrosis Debridement: Level of Consciousness (Pre- Awake and Alert procedure): Pre-procedure Verification/Time Yes - 11:10 Out Taken: Start Time: 11:10 Pain Control: Lidocaine Total Area Debrided (L x W): 1.5 (cm) x 2 (cm) = 3 (cm) Tissue and other material Viable, Eschar, Slough, Subcutaneous, Slough debrided: Level: Skin/Subcutaneous Tissue Debridement Description: Excisional Instrument: Curette Bleeding: Minimum Hemostasis Achieved: Pressure End Time: 11:15 Response to Treatment:  Procedure was tolerated well Level of Consciousness Awake and Alert (Post-procedure): Post Debridement Measurements of Total Wound Length: (cm) 1.5 Width: (cm) 2 Depth: (cm) 0.5 Volume: (cm) 1.178 Character of Wound/Ulcer Post Debridement: Stable Severity of Tissue Post Debridement: Bone involvement without necrosis Post Procedure Diagnosis Same as Pre-procedure Electronic Signature(s) Signed: 05/15/2019 12:19:09 PM By: Worthy Keeler PA-C Signed: 05/15/2019 12:27:29 PM By: Army Melia Entered By: Army Melia on 05/15/2019 11:16:00 Sandy Roberson ([id number]) -------------------------------------------------------------------------------- HPI Details Patient Name: Sandy Roberson Date of Service: 05/15/2019 10:30 AM Medical Record Number: [id number] Patient Account Number: [id number] Date of Birth/Sex: [birth year] (86 y.o. F) Treating RN: Cornell Barman Primary Care Provider: Tracie Harrier Other Clinician: Referring Provider: Tracie Harrier Treating Provider/Extender: Melburn Hake, Yomaira Solar Weeks in Treatment: 6 History of Present Illness HPI Description: 03/30/2019 on evaluation today patient presents for initial evaluation here in our clinic as a referral from Dr. Selina Cooley office who is a local podiatrist. Unfortunately the patient has experienced significant issues with decreased ability to heal in regard to a surgical site and again this is a fairly complicated history. The patient has undergone 3 vascular interventions the most recent of which was actually performed on 12//20. This was subsequent to the patient having ABIs which were somewhat poor registering at 0.67 on the right with a TBI of 0.16 and on the left she was 0.77 with a TBI of 0.24. Subsequently the patient had had surgery to actually remove a portion of the first metatarsal on her right foot. They did not remove the toe although again from the patient's standpoint I am not 100% sure exactly the reasoning  behind what was done or was not done but again I think a big part of the issue was trying to be as minimally invasive for the patient as possible do #1 to her arterial status #2 due to her age. Nonetheless unfortunately she has had some trouble with healing and when the sutures were removed the wound apparently dehisced. The patient does currently have more recent vascular intervention which again was on December 4  with Dr. Lazaro Arms and at this point the patient did have angioplasty which upon completion showed inline flow into the foot with less than 20% residual stenosis after angioplasties. There were multiple sites which had to be worked on during the procedure by Dr. Lucky Cowboy. Again the patient has very problematic blood flow with regard to her arterial status at this point. She does have a history of dysphagia, stage III kidney disease, and congestive heart failure as well. 04/06/2019 on evaluation today patient appears to be doing in my opinion about the same there may be slightly better with regard to the overall. With that being said she still is healing slowly obviously has only been 1 week and now the wounds do appear to be clean enough I still think there is can be quite a bit of work to do going forward before will be where we really need to be here. She does have an appointment with vascular on Wednesday which is just 2 days away for repeat vascular studies along with evaluation with the physician as well. This will be good information for [place] to know going forward as far as her arterial status and what we can or cannot do from the standpoint of debridement or otherwise. 12/28-Patient returns at 1 week with the right medial metatarsal wound, this appears to be better with the rim of granulation tissue noted. We have been using Iodoflex. Patient had vascular studies done that indicate good flow to this foot, the left foot has ABI of 0.59 and a TBI 0.34 and patient has vascular follow-up for  revascularization efforts. 04/20/2019 on evaluation today patient's right first metatarsal ulcer actually appears to be doing about the same is making slow progress. With that being said she unfortunately has blisters over the left lower extremity, left medial ankle, and she still has apparently a pressure ulcer in the gluteal region which is in the right gluteus and is completely new to [place]. This is something that was not known of previous. In fact they did not even tell us about it today until at the end of the visit when we were getting ready to go and her son mention this. 04/27/2019 upon evaluation today patient appears to be doing excellent with regard to her wounds all things considered. I do feel like she is making some progress here she did have her vascular intervention/angiogram which was on 04/23/2019. With that being said I do not have the records for review today as the note is not actually in yet with looking in the system. Nonetheless Dr. Lucky Cowboy did perform this procedure. The patient has had some increased swelling since the procedure in the left lower extremity but this is to be expected. Obviously that will cause the wounds to drain more. Nonetheless overall I do not see any signs of active infection. Her right foot ulcer seems to be making progress albeit slow. 05/08/2019 on evaluation today patient appears to be doing well with regard to her wounds in general. I feel like that other than the fact that her toe ulcer on the left third toe worsened from where she had just a small blood blister to where it actually developed into more of a wound. This is something that now hopefully should reverse now that she has had the intervention on the left side and hopefully is better able to heal some of these wound areas in fact I really feel like a lot is drying up and appearing much better than what it was  previous. Fortunately there is no signs of active infection at this time. No fevers,  chills, nausea, vomiting, or diarrhea. 05/15/2019 on evaluation today patient appears to be doing okay in general with regard to her wounds. The wound in the first [place] ([id number]) metatarsal region on the right seems to be doing quite well. I see definite improvement. The lower extremity ulcers both appear to be doing well on the leg as well as the ankle on the left and the right gluteal ulcer is very dry I think we may need to use collagen and a dressing to keep this more moist allow to heal more appropriately. 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 Electronic Signature(s) Signed: 05/15/2019 11:50:22 AM By: Worthy Keeler PA-C Entered By: Worthy Keeler on 05/15/2019 11:50:21 Sandy Roberson, Sandy Roberson ([id number]) -------------------------------------------------------------------------------- Physical Exam Details Patient Name: Sandy Roberson Date of Service: 05/15/2019 10:30 AM Medical Record Number: [id number] Patient Account Number: [id number] Date of Birth/Sex: Mar 24, 1916 (86 y.o. F) Treating RN: Cornell Barman Primary Care Provider: Tracie Harrier Other Clinician: Referring Provider: Tracie Harrier Treating Provider/Extender: Melburn Hake, Alexus Galka Weeks in Treatment: 6 Constitutional Well-nourished and well-hydrated in no acute distress. Respiratory normal breathing without difficulty. Psychiatric this patient is able to make decisions and demonstrates good insight into disease process. Alert and Oriented x 3. pleasant and cooperative. Notes Patient's wound currently on the left third toe did require sharp debridement to clear away necrotic debris she tolerated this with some discomfort but fortunately this was not too significant as I was debriding today. No other wounds required  any sharp debridement at this point which is good news everything seems to be in general measuring and doing a little bit better. Electronic Signature(s) Signed: 05/15/2019 11:51:01 AM By: Worthy Keeler PA-C Entered By: Worthy Keeler on 05/15/2019 11:51:00 Sandy Roberson ([id number]) -------------------------------------------------------------------------------- Physician Orders Details Patient Name: Sandy Roberson Date of Service: 05/15/2019 10:30 AM Medical Record Number: [id number] Patient Account Number: [id number] Date of Birth/Sex: 11-25-1915 (86 y.o. F) Treating RN: Army Melia Primary Care Provider: Tracie Harrier Other Clinician: Referring Provider: Tracie Harrier Treating Provider/Extender: Melburn Hake, Danthony Kendrix Weeks in Treatment: 6 Verbal / Phone Orders: No Diagnosis Coding ICD-10 Coding Code Description I73.89 Other specified peripheral vascular diseases T81.31XA Disruption of external operation (surgical) wound, not elsewhere classified, initial encounter L97.513 Non-pressure chronic ulcer of other part of right foot with necrosis of muscle L89.312 Pressure ulcer of right buttock, stage 2 L97.821 Non-pressure chronic ulcer of other part of left lower leg limited to breakdown of skin R13.12 Dysphagia, oropharyngeal phase N18.30 Chronic kidney disease, stage 3 unspecified I50.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 (congestive) heart failure Wound Cleansing Wound #1 Right,Medial Metatarsal head first o Dial antibacterial soap, wash wounds, rinse and pat dry prior to dressing wounds Wound #2 Left,Medial Lower Leg o Dial antibacterial soap, wash wounds, rinse and pat dry prior to dressing wounds Wound #3 Left,Medial Ankle o Dial antibacterial soap, wash wounds, rinse and pat dry prior to dressing wounds Wound #4 Right Gluteus o Dial antibacterial soap, wash wounds, rinse and pat dry prior to dressing wounds Wound #5 Left Toe  Third o Dial antibacterial soap, wash wounds, rinse and pat dry prior to dressing wounds Skin Barriers/Peri-Wound Care Wound #4 Right Gluteus o Barrier cream Primary  Wound Dressing Wound #1 Right,Medial Metatarsal head first o Iodoflex Wound #2 Left,Medial Lower Leg o Silver Alginate Wound #3 Left,Medial Ankle o Silver Alginate Wound #5 Left Toe Third Sandy Roberson, Sandy Roberson ([id number]) o Silver Alginate Secondary Dressing Wound #1 Right,Medial Metatarsal head first o ABD pad o Conform/Kerlix - conform to secure o Drawtex - Place Drawtex right behind iodoflex Wound #2 Left,Medial Lower Leg o ABD pad o Conform/Kerlix - conform to secure o Drawtex - Place Drawtex right behind iodoflex Wound #3 Left,Medial Ankle o ABD pad o Conform/Kerlix - conform to secure o Drawtex - Place Drawtex right behind iodoflex Dressing Change Frequency Wound #1 Right,Medial Metatarsal head first o Change Dressing Monday, Wednesday, Friday Wound #2 Left,Medial Lower Leg o Change Dressing Monday, Wednesday, Friday Wound #3 Left,Medial Ankle o Change Dressing Monday, Wednesday, Friday Wound #4 Right Gluteus o Change Dressing Monday, Wednesday, Friday Wound #5 Left Toe Third o Change Dressing Monday, Wednesday, Friday Follow-up Appointments Wound #1 Right,Medial Metatarsal head first o Return Appointment in 1 week. Wound #2 Left,Medial Lower Leg o Return Appointment in 1 week. Wound #3 Left,Medial Ankle o Return Appointment in 1 week. Wound #4 Right Gluteus o Return Appointment in 1 week. Wound #5 Left Toe Third o Return Appointment in 1 week. Edema Control Wound #2 Left,Medial Lower Leg o Other: - Tubi Grip single layer Wound #3 Left,Medial Ankle o Other: - Tubi Grip single layer Sandy Roberson, Sandy Roberson ([id number]) Home Health Wound #1 Right,Medial Metatarsal head first o [place] for [place] to  change Wednesdays and Fridays o [place] Visits -  to change Wednesdays and Fridays o Home Health Nurse may visit PRN to address patientos wound care needs. o Home Health Nurse may visit PRN to address patientos wound care needs. o FACE TO FACE ENCOUNTER: MEDICARE and MEDICAID PATIENTS: I certify that this patient is under my care and that I had a face-to-face encounter that meets the physician face-to-face encounter requirements with this patient on this date. The encounter with the patient was in whole or in part for the following MEDICAL CONDITION: (primary reason for [place]) MEDICAL NECESSITY: I certify, that based on my findings, NURSING services are a medically necessary home health service. HOME BOUND STATUS: I certify that my clinical findings support that this patient is homebound (i.e., Due to illness or injury, pt requires aid of supportive devices such as crutches, cane, wheelchairs, walkers, the use of special transportation or the assistance of another person to leave their place of residence. There is a normal inability to leave the home and doing so requires considerable and taxing effort. Other absences are for medical reasons / religious services and are infrequent or of short duration when for other reasons). o FACE TO FACE ENCOUNTER: MEDICARE and MEDICAID PATIENTS: I certify that this patient is under my care and that I had a face-to-face encounter that meets the physician face-to-face encounter requirements with this patient on this date. The encounter with the patient was in whole or in part for the following MEDICAL CONDITION: (primary reason for [place]) MEDICAL NECESSITY: I certify, that based on my findings, NURSING services are a medically necessary home health service. HOME BOUND STATUS: I certify that my clinical findings support that this patient is homebound (i.e., Due to  illness or injury, pt requires aid of supportive devices such as crutches, cane, wheelchairs, walkers, the use of special transportation or the assistance of another  person to leave their place of residence. There is a normal inability to leave the home and doing so requires considerable and taxing effort. Other absences are for medical reasons / religious services and are infrequent or of short duration when for other reasons). o If current dressing causes regression in wound condition, may D/C ordered dressing product/s and apply Normal Saline Moist Dressing daily until next [place] / Other MD appointment. [place] of regression in wound condition at [phone]. o If current dressing causes regression in wound condition, may D/C ordered dressing product/s and apply Normal Saline Moist Dressing daily until next [place] / Other MD appointment. [place] of regression in wound condition at [phone]. o Please direct any NON-WOUND related issues/requests for orders to patient's Primary Care Physician o Please direct any NON-WOUND related issues/requests for orders to patient's Primary Care Physician Wound #2 Left,Medial Lower Leg o [place] Visits - [place] to change Wednesdays and Fridays o Home Health Nurse may visit PRN to address patientos wound care needs. o FACE TO FACE ENCOUNTER: MEDICARE and MEDICAID PATIENTS: I certify that this patient is under my care and that I had a face-to-face encounter that meets the physician face-to-face encounter requirements with this patient on this date. The encounter with the patient was in whole or in part for the following MEDICAL CONDITION: (primary reason for [place]) MEDICAL NECESSITY: I certify, that based on my findings, NURSING services are a medically necessary home health service. HOME BOUND STATUS: I certify that my clinical findings  support that this patient is homebound (i.e., Due to illness or injury, pt requires aid of supportive devices such as crutches, cane, wheelchairs, walkers, the use of special transportation or the assistance of another person to leave their place of residence. There is a normal inability to leave the home and doing so requires considerable and taxing effort. Other absences are for medical reasons / religious services and are infrequent or of short duration when for other reasons). o If current dressing causes regression in wound condition, may D/C ordered dressing product/s and apply Normal Saline Moist Dressing daily until next [place] / Other MD appointment. [place] of regression in wound condition at [phone]. o Please direct any NON-WOUND related issues/requests for orders to patient's Primary Care Physician Wound #3 [place] Visits - [place] to change Wednesdays and Fridays o Home Health Nurse may visit PRN to address patientos wound care needs. Sandy Roberson, Sandy Roberson ([id number]) o FACE TO FACE ENCOUNTER: MEDICARE and MEDICAID PATIENTS: I certify that this patient is under my care and that I had a face-to-face encounter that meets the physician face-to-face encounter requirements with this patient on this date. The encounter with the patient was in whole or in part for the following MEDICAL CONDITION: (primary reason for [place]) MEDICAL NECESSITY: I certify, that based on my findings, NURSING services are a medically necessary home health service. HOME BOUND STATUS: I certify that my clinical findings support that this patient is homebound (i.e., Due to illness or injury, pt requires aid of supportive devices such as crutches, cane, wheelchairs, walkers, the use of special transportation or the assistance of another person to leave their place of residence. There is a normal inability to leave  the home and doing so requires considerable and taxing effort. Other absences are for medical reasons / religious services and are infrequent or of short  duration when for other reasons). o If current dressing causes regression in wound condition, may D/C ordered dressing product/s and apply Normal Saline Moist Dressing daily until next [place] / Other MD appointment. [place] of regression in wound condition at [phone]. o Please direct any NON-WOUND related issues/requests for orders to patient's Primary Care Physician Wound #4 Right [place] Visits - [place] to change Wednesdays and Fridays o Home Health Nurse may visit PRN to address patientos wound care needs. o FACE TO FACE ENCOUNTER: MEDICARE and MEDICAID PATIENTS: I certify that this patient is under my care and that I had a face-to-face encounter that meets the physician face-to-face encounter requirements with this patient on this date. The encounter with the patient was in whole or in part for the following MEDICAL CONDITION: (primary reason for [place]) MEDICAL NECESSITY: I certify, that based on my findings, NURSING services are a medically necessary home health service. HOME BOUND STATUS: I certify that my clinical findings support that this patient is homebound (i.e., Due to illness or injury, pt requires aid of supportive devices such as crutches, cane, wheelchairs, walkers, the use of special transportation or the assistance of another person to leave their place of residence. There is a normal inability to leave the home and doing so requires considerable and taxing effort. Other absences are for medical reasons / religious services and are infrequent or of short duration when for other reasons). o If current dressing causes regression in wound condition, may D/C ordered dressing product/s and apply Normal Saline Moist Dressing daily  until next [place] / Other MD appointment. [place] of regression in wound condition at [phone]. o Please direct any NON-WOUND related issues/requests for orders to patient's Primary Care Physician Wound #5 Left Toe [place] Visits - [place] to change Wednesdays and Fridays o Home Health Nurse may visit PRN to address patientos wound care needs. o FACE TO FACE ENCOUNTER: MEDICARE and MEDICAID PATIENTS: I certify that this patient is under my care and that I had a face-to-face encounter that meets the physician face-to-face encounter requirements with this patient on this date. The encounter with the patient was in whole or in part for the following MEDICAL CONDITION: (primary reason for [place]) MEDICAL NECESSITY: I certify, that based on my findings, NURSING services are a medically necessary home health service. HOME BOUND STATUS: I certify that my clinical findings support that this patient is homebound (i.e., Due to illness or injury, pt requires aid of supportive devices such as crutches, cane, wheelchairs, walkers, the use of special transportation or the assistance of another person to leave their place of residence. There is a normal inability to leave the home and doing so requires considerable and taxing effort. Other absences are for medical reasons / religious services and are infrequent or of short duration when for other reasons). o If current dressing causes regression in wound condition, may D/C ordered dressing product/s and apply Normal Saline Moist Dressing daily until next [place] / Other MD appointment. [place] of regression in wound condition at [phone]. o Please direct any NON-WOUND related issues/requests for orders to patient's Primary Care Physician Electronic Signature(s) Signed: 05/15/2019 12:19:09 PM By: Worthy Keeler PA-C Signed:  05/15/2019 12:27:29 PM By: Army Melia Entered By: Army Melia on 05/15/2019 11:44:47 Sandy Roberson, Sandy Roberson ([id number]) Sandy Roberson, Sandy Roberson ([id number]) -------------------------------------------------------------------------------- Problem List Details Patient Name:  Sandy Roberson Date of Service: 05/15/2019 10:30 AM Medical Record Number: [id number] Patient Account Number: [id number] Date of Birth/Sex: April 05, 1916 (86 y.o. F) Treating RN: Cornell Barman Primary Care Provider: Tracie Harrier Other Clinician: Referring Provider: Tracie Harrier Treating Provider/Extender: Melburn Hake, Sya Nestler Weeks in Treatment: 6 Active Problems ICD-10 Evaluated Encounter Code Description Active Date Today Diagnosis I73.89 Other specified peripheral vascular diseases 03/30/2019 No Yes T81.31XA Disruption of external operation (surgical) wound, not 03/30/2019 No Yes elsewhere classified, initial encounter L97.513 Non-pressure chronic ulcer of other part of right foot with 03/30/2019 No Yes necrosis of muscle L89.312 Pressure ulcer of right buttock, stage 2 04/22/2019 No Yes L97.821 Non-pressure chronic ulcer of other part of left lower leg 04/22/2019 No Yes limited to breakdown of skin 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 05/15/2019 No Yes involvement without evidence of necrosis R13.12 Dysphagia, oropharyngeal phase 03/30/2019 No Yes N18.30 Chronic kidney disease, stage 3 unspecified 03/30/2019 No Yes I50.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 0000000 No Yes (congestive) heart failure Sandy Roberson, Sandy Roberson ([id number]) Inactive Problems Resolved Problems Electronic Signature(s) Signed: 05/15/2019 11:52:45 AM By: Worthy Keeler PA-C Previous Signature: 05/15/2019 10:39:18 AM Version By: Worthy Keeler PA-C Entered By: Worthy Keeler on 05/15/2019 11:52:45 Sandy Roberson  ([id number]) -------------------------------------------------------------------------------- Progress Note Details Patient Name: Sandy Roberson Date of Service: 05/15/2019 10:30 AM Medical Record Number: [id number] Patient Account Number: [id number] Date of Birth/Sex: 1916/04/06 (86 y.o. F) Treating RN: Cornell Barman Primary Care Provider: Tracie Harrier Other Clinician: Referring Provider: Tracie Harrier Treating Provider/Extender: Melburn Hake, Kelita Wallis Weeks in Treatment: 6 Subjective Chief Complaint Information obtained from Patient Right medial foot surgical ulcer, Left LE ulcers, and right gluteal ulcer History of Present Illness (HPI) 03/30/2019 on evaluation today patient presents for initial evaluation here in our clinic as a referral from Dr. Selina Cooley office who is a local podiatrist. Unfortunately the patient has experienced significant issues with decreased ability to heal in regard to a surgical site and again this is a fairly complicated history. The patient has undergone 3 vascular interventions the most recent of which was actually performed on 12//20. This was subsequent to the patient having ABIs which were somewhat poor registering at 0.67 on the right with a TBI of 0.16 and on the left she was 0.77 with a TBI of 0.24. Subsequently the patient had had surgery to actually remove a portion of the first metatarsal on her right foot. They did not remove the toe although again from the patient's standpoint I am not 100% sure exactly the reasoning behind what was done or was not done but again I think a big part of the issue was trying to be as minimally invasive for the patient as possible do #1 to her arterial status #2 due to her age. Nonetheless unfortunately she has had some trouble with healing and when the sutures were removed the wound apparently dehisced. The patient does currently have more recent vascular intervention which again was on December 4 with Dr. Lazaro Arms and at  this point the patient did have angioplasty which upon completion showed inline flow into the foot with less than 20% residual stenosis after angioplasties. There were multiple sites which had to be worked on during the procedure by Dr. Lucky Cowboy. Again the patient has very problematic blood flow with regard to her arterial status at this point. She does have a history of dysphagia, stage III kidney disease, and congestive heart failure as well. 04/06/2019 on evaluation today patient appears  to be doing in my opinion about the same there may be slightly better with regard to the overall. With that being said she still is healing slowly obviously has only been 1 week and now the wounds do appear to be clean enough I still think there is can be quite a bit of work to do going forward before will be where we really need to be here. She does have an appointment with vascular on Wednesday which is just 2 days away for repeat vascular studies along with evaluation with the physician as well. This will be good information for [place] to know going forward as far as her arterial status and what we can or cannot do from the standpoint of debridement or otherwise. 12/28-Patient returns at 1 week with the right medial metatarsal wound, this appears to be better with the rim of granulation tissue noted. We have been using Iodoflex. Patient had vascular studies done that indicate good flow to this foot, the left foot has ABI of 0.59 and a TBI 0.34 and patient has vascular follow-up for revascularization efforts. 04/20/2019 on evaluation today patient's right first metatarsal ulcer actually appears to be doing about the same is making slow progress. With that being said she unfortunately has blisters over the left lower extremity, left medial ankle, and she still has apparently a pressure ulcer in the gluteal region which is in the right gluteus and is completely new to [place]. This is something that was not known of previous. In  fact they did not even tell us about it today until at the end of the visit when we were getting ready to go and her son mention this. 04/27/2019 upon evaluation today patient appears to be doing excellent with regard to her wounds all things considered. I do feel like she is making some progress here she did have her vascular intervention/angiogram which was on 04/23/2019. With that being said I do not have the records for review today as the note is not actually in yet with looking in the system. Nonetheless Dr. Lucky Cowboy did perform this procedure. The patient has had some increased swelling since the procedure in the left lower extremity but this is to be expected. Obviously that will cause the wounds to drain more. Nonetheless overall I do not see any signs of active infection. Her right foot ulcer seems to be making progress albeit slow. 05/08/2019 on evaluation today patient appears to be doing well with regard to her wounds in general. I feel like that other than the fact that her toe ulcer on the left third toe worsened from where she had just a small blood blister to where it actually Sandy Roberson, Sandy Roberson ([id number]) developed into more of a wound. This is something that now hopefully should reverse now that she has had the intervention on the left side and hopefully is better able to heal some of these wound areas in fact I really feel like a lot is drying up and appearing much better than what it was previous. Fortunately there is no signs of active infection at this time. No fevers, chills, nausea, vomiting, or diarrhea. 05/15/2019 on evaluation today patient appears to be doing okay in general with regard to her wounds. The wound in the first metatarsal region on the right seems to be doing quite well. I see definite improvement. The lower extremity ulcers both appear to be doing well on the leg as well as the ankle on the left and the right gluteal ulcer  is very dry I think we may need to use  collagen and a dressing to keep this more moist allow to heal more appropriately. 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 Objective Constitutional Well-nourished and well-hydrated in no acute distress. Vitals Time Taken: 10:47 AM, Height: 63 in, Weight: 118 lbs, BMI: 20.9, Temperature: 97.5 F, Pulse: 99 bpm, Respiratory Rate: 16 breaths/min, Blood Pressure: 136/57 mmHg. Respiratory normal breathing without difficulty. Psychiatric this patient is able to make decisions and demonstrates good insight into disease process. Alert and Oriented x 3. pleasant and cooperative. General Notes: Patient's wound currently on the left third toe did require sharp debridement to clear away necrotic debris she tolerated this with some discomfort but fortunately this was not too significant as I was debriding today. No other wounds required any sharp debridement at this point which is good news everything seems to be in general measuring and doing a little bit better. Integumentary (Hair, Skin) Wound #1 status is Open. Original cause of wound was Gradually Appeared. The wound is located on the Right,Medial Metatarsal head first. The wound measures 3.5cm length x 2.5cm width x 1.5cm depth; 6.872cm^2 area and 10.308cm^3 volume. There is bone, muscle, and Fat Layer (Subcutaneous Tissue) Exposed exposed. There is no tunneling or undermining noted. There is a medium amount of serous drainage noted. Foul odor after cleansing was noted. The wound margin is flat and intact. There is small (1-33%) pink granulation within the wound bed. There is a large (67-100%) amount of necrotic tissue within the wound bed including Eschar, Adherent Slough and Necrosis of Muscle. Wound #2 status is Open. Original cause of wound was Blister. The wound  is located on the Left,Medial Lower Leg. The wound measures 4.4cm length x 4.2cm width x 0.1cm depth; 14.514cm^2 area and 1.451cm^3 volume. There is Fat Layer (Subcutaneous Tissue) Exposed exposed. There is no tunneling or undermining noted. There is a large amount of serous drainage noted. The wound margin is flat and intact. There is large (67-100%) pink granulation within the wound bed. There is a small (1-33%) amount of necrotic tissue within the wound bed. Wound #3 status is Open. Original cause of wound was Blister. The wound is located on the Left,Medial Ankle. The wound measures 0.6cm length x 0.5cm width x 0.2cm depth; 0.236cm^2 area and 0.047cm^3 volume. There is Fat Layer (Subcutaneous Tissue) Exposed exposed. There is a large amount of serous drainage noted. The wound margin is flat and intact. There is medium (34-66%) pink granulation within the wound bed. There is a medium (34-66%) amount of necrotic [place], [place]lin ([id number]) tissue within the wound bed. Wound #4 status is Open. Original cause of wound was Pressure Injury. The wound is located on the Right Gluteus. The wound measures 1.2cm length x 1.5cm width x 0.1cm depth; 1.414cm^2 area and 0.141cm^3 volume. There is Fat Layer (Subcutaneous Tissue) Exposed exposed. There is a medium amount of serous drainage noted. The wound margin is flat and intact. There is large (67-100%) pink granulation within the wound bed. There is no necrotic tissue within the wound bed. Wound #5 status is Open. Original cause of wound was Gradually Appeared. The wound is located on the Left Toe Third. The wound measures 0.1cm length x 0.1cm width x 0.1cm depth; 0.008cm^2 area and 0.001cm^3  volume. There is no tunneling or undermining noted. There is a medium amount of serous drainage noted. The wound margin is indistinct and nonvisible. There is no granulation within the wound bed. There is a large (67-100%) amount of necrotic tissue within the  wound bed including Eschar. 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 There was a Excisional Skin/Subcutaneous Tissue Debridement with a total area of 3 sq cm performed by STONE III, Kerissa Coia E., PA-C. With the following instrument(s): Curette to remove Viable tissue/material. Material removed includes Eschar, Subcutaneous Tissue, and Slough after achieving pain control using Lidocaine. No specimens were taken. A time out was conducted at 11:10, prior to the start of the procedure. A Minimum amount of bleeding was controlled with Pressure. The procedure was tolerated well. Post Debridement Measurements: 1.5cm length x 2cm width x 0.5cm depth; 1.178cm^3 volume. Character of Wound/Ulcer Post Debridement is stable. Severity of Tissue Post Debridement is: Bone involvement without necrosis. Post procedure Diagnosis Wound #5: Same as Pre-Procedure Plan Wound Cleansing: Sandy Roberson, Sandy Roberson ([id number]) Wound #1 Right,Medial Metatarsal head first: Dial antibacterial soap, wash wounds, rinse and pat dry prior to dressing wounds Wound #2 Left,Medial Lower Leg: Dial antibacterial soap, wash wounds, rinse and pat dry  prior to dressing wounds Wound #3 Left,Medial Ankle: Dial antibacterial soap, wash wounds, rinse and pat dry prior to dressing wounds Wound #4 Right Gluteus: Dial antibacterial soap, wash wounds, rinse and pat dry prior to dressing wounds Wound #5 Left Toe Third: Dial antibacterial soap, wash wounds, rinse and pat dry prior to dressing wounds Skin Barriers/Peri-Wound Care: Wound #4 Right Gluteus: Barrier cream Primary Wound Dressing: Wound #1 Right,Medial Metatarsal head first: Iodoflex Wound #2 Left,Medial Lower Leg: Silver Alginate Wound #3 Left,Medial Ankle: Silver Alginate Wound #5 Left Toe Third: Silver Alginate Secondary Dressing: Wound #1 Right,Medial Metatarsal head first: ABD pad Conform/Kerlix - conform to secure Drawtex - Place Drawtex right behind iodoflex Wound #2 Left,Medial Lower Leg: ABD pad Conform/Kerlix - conform to secure Drawtex - Place Drawtex right behind iodoflex Wound #3 Left,Medial Ankle: ABD pad Conform/Kerlix - conform to secure Drawtex - Place Drawtex right behind iodoflex Dressing Change Frequency: Wound #1 Right,Medial Metatarsal head first: Change Dressing Monday, Wednesday, Friday Wound #2 Left,Medial Lower Leg: Change Dressing Monday, Wednesday, Friday Wound #3 Left,Medial Ankle: Change Dressing Monday, Wednesday, Friday Wound #4 Right Gluteus: Change Dressing Monday, Wednesday, Friday Wound #5 Left Toe Third: Change Dressing Monday, Wednesday, Friday Follow-up Appointments: Wound #1 Right,Medial Metatarsal head first: Return Appointment in 1 week. Wound #2 Left,Medial Lower Leg: Return Appointment in 1 week. Wound #3 Left,Medial Ankle: Return Appointment in 1 week. Wound #4 Right Gluteus: Return Appointment in 1 week. Wound #5 Left Toe Third: Return Appointment in 1 week. Edema Control: Wound #2 Left,Medial Lower Leg: Other: - Tubi Grip single layer Sandy Roberson, Sandy Roberson ([id number]) Wound #3 Left,Medial Ankle: Other: -  Tubi  Grip single layer Home Health: Wound #1 Right,Medial Metatarsal head first: [place] for [place] to change Wednesdays and Fridays [place] to change Wednesdays and Fridays Home Health Nurse may visit PRN to address patient s wound care needs. Home Health Nurse may visit PRN to address patient s wound care needs. FACE TO FACE ENCOUNTER: MEDICARE and MEDICAID PATIENTS: I certify that this patient is under my care and that I had a face-to-face encounter that meets the physician face-to-face encounter requirements with this patient on this date. The encounter with the patient was in whole or in part for the following MEDICAL CONDITION: (primary reason for [place]) MEDICAL NECESSITY: I certify, that based on my findings, NURSING services are a medically necessary home health service. HOME BOUND STATUS: I certify that my clinical findings support that this patient is homebound (i.e., Due to illness or injury, pt requires aid of supportive devices such as crutches, cane, wheelchairs, walkers, the use of special transportation or the assistance of another person to leave their place of residence. There is a normal inability to leave the home and doing so requires considerable and taxing effort. Other absences are for medical reasons / religious services and are infrequent or of short duration when for other reasons). FACE TO FACE ENCOUNTER: MEDICARE and MEDICAID PATIENTS: I certify that this patient is under my care and that I had a face-to-face encounter that meets the physician face-to-face encounter requirements with this patient on this date. The encounter with the patient was in whole or in part for the following MEDICAL CONDITION: (primary reason for [place]) MEDICAL NECESSITY: I certify, that based on my findings, NURSING services are a medically necessary home health  service. HOME BOUND STATUS: I certify that my clinical findings support that this patient is homebound (i.e., Due to illness or injury, pt requires aid of supportive devices such as crutches, cane, wheelchairs, walkers, the use of special transportation or the assistance of another person to leave their place of residence. There is a normal inability to leave the home and doing so requires considerable and taxing effort. Other absences are for medical reasons / religious services and are infrequent or of short duration when for other reasons). If current dressing causes regression in wound condition, may D/C ordered dressing product/s and apply Normal Saline Moist Dressing daily until next [place] / Other MD appointment. [place] of regression in wound condition at [phone]. If current dressing causes regression in wound condition, may D/C ordered dressing product/s and apply Normal Saline Moist Dressing daily until next [place] / Other MD appointment. [place] of regression in wound condition at [phone]. Please direct any NON-WOUND related issues/requests for orders to patient's Primary Care Physician Please direct any NON-WOUND related issues/requests for orders to patient's Primary Care Physician Wound #2 Left,Medial Lower Leg: [place] to change Wednesdays and Fridays Home Health Nurse may visit PRN to address patient s wound care needs. FACE TO FACE ENCOUNTER: MEDICARE and MEDICAID PATIENTS: I certify that this patient is under my care and that I had a face-to-face encounter that meets the physician face-to-face encounter requirements with this patient on this date. The encounter with the patient was in whole or in part for the following MEDICAL CONDITION: (primary reason for [employer]) MEDICAL NECESSITY: I certify, that based on  my findings, NURSING services are a  medically necessary home health service. HOME BOUND STATUS: I certify that my clinical findings support that this patient is homebound (i.e., Due to illness or injury, pt requires aid of supportive devices such as crutches, cane, wheelchairs, walkers, the use of special transportation or the assistance of another person to leave their place of residence. There is a normal inability to leave the home and doing so requires considerable and taxing effort. Other absences are for medical reasons / religious services and are infrequent or of short duration when for other reasons). If current dressing causes regression in wound condition, may D/C ordered dressing product/s and apply Normal Saline Moist Dressing daily until next [place] / Other MD appointment. [place] of regression in wound condition at [phone]. Please direct any NON-WOUND related issues/requests for orders to patient's Primary Care Physician Wound #3 Left,Medial Ankle: [place] to change Wednesdays and Fridays Home Health Nurse may visit PRN to address patient s wound care needs. FACE TO FACE ENCOUNTER: MEDICARE and MEDICAID PATIENTS: I certify that this patient is under my care and that I had a face-to-face encounter that meets the physician face-to-face encounter requirements with this patient on this date. The encounter with the patient was in whole or in part for the following MEDICAL CONDITION: (primary reason for [place]) MEDICAL NECESSITY: I certify, that based on my findings, NURSING services are a medically necessary home health service. HOME BOUND STATUS: I certify that my clinical findings support that this patient is homebound (i.e., Due to illness or injury, pt requires aid of supportive devices such as crutches, cane, wheelchairs, walkers, the use of special Sandy Roberson, Shanin ([id number]) transportation or the assistance of another  person to leave their place of residence. There is a normal inability to leave the home and doing so requires considerable and taxing effort. Other absences are for medical reasons / religious services and are infrequent or of short duration when for other reasons). If current dressing causes regression in wound condition, may D/C ordered dressing product/s and apply Normal Saline Moist Dressing daily until next [place] / Other MD appointment. [place] of regression in wound condition at [phone]. Please direct any NON-WOUND related issues/requests for orders to patient's Primary Care Physician Wound #4 Right Gluteus: [place] to change Wednesdays and Fridays Home Health Nurse may visit PRN to address patient s wound care needs. FACE TO FACE ENCOUNTER: MEDICARE and MEDICAID PATIENTS: I certify that this patient is under my care and that I had a face-to-face encounter that meets the physician face-to-face encounter requirements with this patient on this date. The encounter with the patient was in whole or in part for the following MEDICAL CONDITION: (primary reason for [place]) MEDICAL NECESSITY: I certify, that based on my findings, NURSING services are a medically necessary home health service. HOME BOUND STATUS: I certify that my clinical findings support that this patient is homebound (i.e., Due to illness or injury, pt requires aid of supportive devices such as crutches, cane, wheelchairs, walkers, the use of special transportation or the assistance of another person to leave their place of residence. There is a normal inability to leave the home and doing so requires considerable and taxing effort. Other absences are for medical reasons / religious services and are infrequent or of short duration when for other reasons). If current dressing causes regression  in wound condition, may D/C ordered dressing  product/s and apply Normal Saline Moist Dressing daily until next [place] / Other MD appointment. [place] of regression in wound condition at [phone]. Please direct any NON-WOUND related issues/requests for orders to patient's Primary Care Physician Wound #5 Left Toe Third: [place] Visits - [place] to change Wednesdays and Fridays Home Health Nurse may visit PRN to address patient s wound care needs. FACE TO FACE ENCOUNTER: MEDICARE and MEDICAID PATIENTS: I certify that this patient is under my care and that I had a face-to-face encounter that meets the physician face-to-face encounter requirements with this patient on this date. The encounter with the patient was in whole or in part for the following MEDICAL CONDITION: (primary reason for [place]) MEDICAL NECESSITY: I certify, that based on my findings, NURSING services are a medically necessary home health service. HOME BOUND STATUS: I certify that my clinical findings support that this patient is homebound (i.e., Due to illness or injury, pt requires aid of supportive devices such as crutches, cane, wheelchairs, walkers, the use of special transportation or the assistance of another person to leave their place of residence. There is a normal inability to leave the home and doing so requires considerable and taxing effort. Other absences are for medical reasons / religious services and are infrequent or of short duration when for other reasons). If current dressing causes regression in wound condition, may D/C ordered dressing product/s and apply Normal Saline Moist Dressing daily until next [place] / Other MD appointment. [place] of regression in wound condition at [phone]. Please direct any NON-WOUND related issues/requests for orders to patient's Primary Care Physician 1. My suggestion at this time is good to be that we continue with  the current wound care measures across the board except for over the left third toe we will switch to Iodoflex at this time I think that is good to be a better option. 2. With regard to the gluteal region I am going to recommend that we use collagen and a border foam to cover I think this is good to be the best as honestly the wound is much too dry I think to heal quickly and appropriately. 3. We will continue monitoring for infection I do not think there is any overt infection right now and from an arterial standpoint she seems to be doing much better following intervention. My suggestion is to be that we watch for any signs of infection currently. This was discussed with the patient and her son. We will see patient back for reevaluation in 1 week here in the clinic. If anything worsens or changes patient will contact our office for additional recommendations. Electronic Signature(s) Signed: 05/15/2019 11:53:11 AM By: Worthy Keeler PA-C Previous Signature: 05/15/2019 11:51:54 AM Version By: Benjamine Sprague, Emmie ([id number]) Entered By: Worthy Keeler on 05/15/2019 11:53:10 Sandy Roberson ([id number]) -------------------------------------------------------------------------------- SuperBill Details Patient Name: Sandy Roberson Date of Service: 05/15/2019 Medical Record Number: [id number] Patient Account Number: [id number] Date of Birth/Sex: March 06, 1916 (86 y.o. 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 R13.12 Dysphagia, oropharyngeal phase N18.30 Chronic kidney disease, stage 3 unspecified I50.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 (congestive) heart failure Facility Procedures CPT4: Description Modifier Quantity Code [id number] - DEB SUBQ TISSUE 20 SQ CM/< 1 ICD-10 Diagnosis Description 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 Physician Procedures CPT4: Description Modifier Quantity Code [id number] - WC PHYS LEVEL 3 - EST PT 25 1 ICD-10 Diagnosis Description I73.89 Other specified peripheral vascular diseases T81.31XA Disruption of external operation (surgical) wound, not elsewhere classified,  initial encounter L97.513 Non-pressure chronic ulcer of other part of right foot with necrosis of muscle L89.312 Pressure ulcer of right buttock, stage 2 CPT4: [id number] - WC PHYS SUBQ TISS 20 SQ CM 1 ICD-10 Diagnosis Description 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

## 2019-05-21 ENCOUNTER — Other Ambulatory Visit: Payer: Self-pay

## 2019-05-21 ENCOUNTER — Ambulatory Visit (INDEPENDENT_AMBULATORY_CARE_PROVIDER_SITE_OTHER): Payer: Medicare Other

## 2019-05-21 ENCOUNTER — Encounter (INDEPENDENT_AMBULATORY_CARE_PROVIDER_SITE_OTHER): Payer: Self-pay | Admitting: Nurse Practitioner

## 2019-05-21 ENCOUNTER — Ambulatory Visit (INDEPENDENT_AMBULATORY_CARE_PROVIDER_SITE_OTHER): Payer: Medicare Other | Admitting: Nurse Practitioner

## 2019-05-21 VITALS — BP 145/92 | HR 86 | Resp 14

## 2019-05-21 DIAGNOSIS — I10 Essential (primary) hypertension: Secondary | ICD-10-CM

## 2019-05-21 DIAGNOSIS — E785 Hyperlipidemia, unspecified: Secondary | ICD-10-CM | POA: Diagnosis not present

## 2019-05-21 DIAGNOSIS — Z95820 Peripheral vascular angioplasty status with implants and grafts: Secondary | ICD-10-CM | POA: Diagnosis not present

## 2019-05-21 DIAGNOSIS — I70222 Atherosclerosis of native arteries of extremities with rest pain, left leg: Secondary | ICD-10-CM

## 2019-05-21 DIAGNOSIS — I7025 Atherosclerosis of native arteries of other extremities with ulceration: Secondary | ICD-10-CM

## 2019-05-22 ENCOUNTER — Encounter: Payer: Medicare Other | Attending: Physician Assistant | Admitting: Physician Assistant

## 2019-05-22 DIAGNOSIS — L97821 Non-pressure chronic ulcer of other part of left lower leg limited to breakdown of skin: Secondary | ICD-10-CM | POA: Diagnosis not present

## 2019-05-22 DIAGNOSIS — L89312 Pressure ulcer of right buttock, stage 2: Secondary | ICD-10-CM | POA: Insufficient documentation

## 2019-05-22 DIAGNOSIS — L97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 of necrosis: Secondary | ICD-10-CM | POA: Insufficient documentation

## 2019-05-22 DIAGNOSIS — L97513 Non-pressure chronic ulcer of other part of right foot with necrosis of muscle: Secondary | ICD-10-CM | POA: Insufficient documentation

## 2019-05-22 DIAGNOSIS — I132 Hypertensive heart and chronic kidney disease with heart failure and with stage 5 chronic kidney disease, or end stage renal disease: Secondary | ICD-10-CM | POA: Diagnosis not present

## 2019-05-22 DIAGNOSIS — I50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 (congestive) heart failure: Secondary | ICD-10-CM | POA: Insufficient documentation

## 2019-05-22 DIAGNOSIS — D649 Anemia, unspecified: Secondary | ICD-10-CM | POA: Diagnosis not present

## 2019-05-22 DIAGNOSIS — R1312 Dysphagia, oropharyngeal phase: Secondary | ICD-10-CM | POA: Diagnosis not present

## 2019-05-22 DIAGNOSIS — I7389 Other specified peripheral vascular diseases: Secondary | ICD-10-CM | POA: Diagnosis present

## 2019-05-22 DIAGNOSIS — I2510 Atherosclerotic heart disease of native coronary artery without angina pectoris: Secondary | ICD-10-CM | POA: Insufficient documentation

## 2019-05-22 DIAGNOSIS — X58XXXA Exposure to other specified factors, initial encounter: Secondary | ICD-10-CM | POA: Insufficient documentation

## 2019-05-22 DIAGNOSIS — N186 End stage renal disease: Secondary | ICD-10-CM | POA: Insufficient documentation

## 2019-05-22 DIAGNOSIS — N183 Chronic kidney disease, stage 3 unspecified: Secondary | ICD-10-CM | POA: Insufficient documentation

## 2019-05-22 DIAGNOSIS — T8131XA Disruption of external operation (surgical) wound, not elsewhere classified, initial encounter: Secondary | ICD-10-CM | POA: Diagnosis not present

## 2019-05-22 NOTE — Progress Notes (Signed)
TAVARIA, SPERBECK ([id number]) Visit Report for 05/22/2019 Arrival Information Details Patient Name: Sandy Roberson, Sandy Roberson Date of Service: 05/22/2019 10:45 AM Medical Record Number: [id number] Patient Account Number: [id number] Date of Birth/Sex: Aug 29, 1915 (86 y.o. F) Treating RN: Montey Hora Primary Care Luiscarlos Kaczmarczyk: Tracie Harrier Other Clinician: Referring Shelaine Frie: Tracie Harrier Treating Mataio Mele/Extender: Melburn Hake, HOYT Weeks in Treatment: 7 Visit Information History Since Last Visit Added or deleted any medications: Yes Patient Arrived: Wheel Chair Any new allergies or adverse reactions: No Arrival Time: 10:49 Had a fall or experienced change in No Accompanied By: son activities of daily living that may affect Transfer Assistance: Manual risk of falls: Patient Identification Verified: Yes Signs or symptoms of abuse/neglect since last visito No Secondary Verification Process Yes Hospitalized since last visit: No Completed: Implantable device outside of the clinic excluding No Patient Has Alerts: Yes cellular tissue based products placed in the center Patient Alerts: Patient on Blood since last visit: Thinner Has Dressing in Place as Prescribed: Yes aspirin 81 Pain Present Now: No 03/10/19 ABI L .77 R .67 TBI L .24 R .16 Electronic Signature(s) Signed: 05/22/2019 2:17:28 PM By: Lorine Bears RCP, RRT, CHT Entered By: Lorine Bears on 05/22/2019 10:50:19 Sandy Roberson ([id number]) -------------------------------------------------------------------------------- Clinic Level of Care Assessment Details Patient Name: Sandy Roberson Date of Service: 05/22/2019 10:45 AM Medical Record Number: [id number] Patient Account Number: [id number] Date of Birth/Sex: 1915/06/04 (86 y.o. F) Treating RN: Montey Hora Primary Care Kamiah Fite: Tracie Harrier Other Clinician: Referring Eniyah Eastmond: Tracie Harrier Treating Jaeley Wiker/Extender:  Melburn Hake, HOYT Weeks in Treatment: 7 Clinic Level of Care Assessment Items TOOL 4 Quantity Score []  - Use when only an EandM is performed on FOLLOW-UP visit 0 ASSESSMENTS - Nursing Assessment / Reassessment X - Reassessment of Co-morbidities (includes updates in patient status) 1 10 X- 1 5 Reassessment of Adherence to Treatment Plan ASSESSMENTS - Wound and Skin Assessment / Reassessment []  - Simple Wound Assessment / Reassessment - one wound 0 X- 5 5 Complex Wound Assessment / Reassessment - multiple wounds []  - 0 Dermatologic / Skin Assessment (not related to wound area) ASSESSMENTS - Focused Assessment []  - Circumferential Edema Measurements - multi extremities 0 []  - 0 Nutritional Assessment / Counseling / Intervention X- 1 5 Lower Extremity Assessment (monofilament, tuning fork, pulses) []  - 0 Peripheral Arterial Disease Assessment (using hand held doppler) ASSESSMENTS - Ostomy and/or Continence Assessment and Care []  - Incontinence Assessment and Management 0 []  - 0 Ostomy Care Assessment and Management (repouching, etc.) PROCESS - Coordination of Care X - Simple Patient / Family Education for ongoing care 1 15 []  - 0 Complex (extensive) Patient / Family Education for ongoing care X- 1 10 Staff obtains Programmer, systems, Records, Test Results / Process Orders []  - 0 Staff telephones HHA, Nursing Homes / Clarify orders / etc []  - 0 Routine Transfer to another Facility (non-emergent condition) []  - 0 Routine Hospital Admission (non-emergent condition) []  - 0 New Admissions / Biomedical engineer / Ordering NPWT, Apligraf, etc. []  - 0 Emergency Hospital Admission (emergent condition) X- 1 10 Simple Discharge Coordination KATONA, SECKER ([id number]) []  - 0 Complex (extensive) Discharge Coordination PROCESS - Special Needs []  - Pediatric / Minor Patient Management 0 []  - 0 Isolation Patient Management []  - 0 Hearing / Language / Visual special needs []  -  0 Assessment of Community assistance (transportation, D/C planning, etc.) []  - 0 Additional assistance / Altered mentation []  - 0 Support Surface(s) Assessment (bed, cushion, seat, etc.) INTERVENTIONS - Wound Cleansing / Measurement []  -  Simple Wound Cleansing - one wound 0 X- 5 5 Complex Wound Cleansing - multiple wounds X- 1 5 Wound Imaging (photographs - any number of wounds) []  - 0 Wound Tracing (instead of photographs) []  - 0 Simple Wound Measurement - one wound X- 5 5 Complex Wound Measurement - multiple wounds INTERVENTIONS - Wound Dressings X - Small Wound Dressing one or multiple wounds 4 10 []  - 0 Medium Wound Dressing one or multiple wounds []  - 0 Large Wound Dressing one or multiple wounds []  - 0 Application of Medications - topical []  - 0 Application of Medications - injection INTERVENTIONS - Miscellaneous []  - External ear exam 0 []  - 0 Specimen Collection (cultures, biopsies, blood, body fluids, etc.) []  - 0 Specimen(s) / Culture(s) sent or taken to Lab for analysis []  - 0 Patient Transfer (multiple staff / Civil Service fast streamer / Similar devices) []  - 0 Simple Staple / Suture removal (25 or less) []  - 0 Complex Staple / Suture removal (26 or more) []  - 0 Hypo / Hyperglycemic Management (close monitor of Blood Glucose) []  - 0 Ankle / Brachial Index (ABI) - do not check if billed separately X- 1 5 Vital Signs Leibold, Monesha ([id number]) Has the patient been seen at the hospital within the last three years: Yes Total Score: 180 Level Of Care: New/Established - Level 5 Electronic Signature(s) Signed: 05/22/2019 1:41:09 PM By: Montey Hora Entered By: Montey Hora on 05/22/2019 11:32:26 Sandy Roberson ([id number]) -------------------------------------------------------------------------------- Encounter Discharge Information Details Patient Name: Sandy Roberson Date of Service: 05/22/2019 10:45 AM Medical Record Number: [id number] Patient  Account Number: [id number] Date of Birth/Sex: 01-22-1916 (86 y.o. F) Treating RN: Montey Hora Primary Care Tallia Moehring: Tracie Harrier Other Clinician: Referring Laticha Ferrucci: Tracie Harrier Treating Ashtan Laton/Extender: Melburn Hake, HOYT Weeks in Treatment: 7 Encounter Discharge Information Items Discharge Condition: Stable Ambulatory Status: Ambulatory Discharge Destination: Home Transportation: Private Auto Accompanied By: son Schedule Follow-up Appointment: Yes Clinical Summary of Care: Electronic Signature(s) Signed: 05/22/2019 1:41:09 PM By: Montey Hora Entered By: Montey Hora on 05/22/2019 11:34:28 Sandy Roberson ([id number]) -------------------------------------------------------------------------------- Lower Extremity Assessment Details Patient Name: Sandy Roberson Date of Service: 05/22/2019 10:45 AM Medical Record Number: [id number] Patient Account Number: [id number] Date of Birth/Sex: August 06, 1915 (86 y.o. F) Treating RN: Army Melia Primary Care Auriah Hollings: Tracie Harrier Other Clinician: Referring Jenniffer Vessels: Tracie Harrier Treating Dwyne Hasegawa/Extender: STONE III, HOYT Weeks in Treatment: 7 Edema Assessment Assessed: [Left: No] [Right: No] Edema: [Left: No] [Right: No] Vascular Assessment Pulses: Dorsalis Pedis Palpable: [Left:Yes] Electronic Signature(s) Signed: 05/22/2019 11:13:24 AM By: Army Melia Entered By: Army Melia on 05/22/2019 11:13:23 Sandy Roberson ([id number]) -------------------------------------------------------------------------------- Multi Wound Chart Details Patient Name: Sandy Roberson Date of Service: 05/22/2019 10:45 AM Medical Record Number: [id number] Patient Account Number: [id number] Date of Birth/Sex: [birth year] (86 y.o. 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 Venous Leg Ulcer Comorbid History: Anemia, Congestive Heart Anemia, Congestive Heart Anemia, Congestive Heart Failure, Coronary Artery Failure, Coronary Artery Failure, Coronary Artery Disease, Hypertension, Disease, Hypertension, Disease, Hypertension, Peripheral Arterial Disease, Peripheral Arterial Disease, Peripheral Arterial Disease, Peripheral Venous Disease, Peripheral Venous Disease, Peripheral Venous Disease, End Stage Renal Disease End  Stage Renal Disease End Stage Renal Disease Date Acquired: 03/16/2018 04/18/2019 04/18/2019 Weeks of Treatment: 7 4 4  Wound Status: Open Open Healed - Epithelialized Pending Amputation on Yes No No Presentation: Measurements L x W x D 2.9x2.4x1 4x3.9x0.1 0x0x0 (cm) Area (cm) : 5.466 12.252 0 Volume (cm) : 5.466 1.225 0 % Reduction in Area: 48.80% -21.90% 100.00% % Reduction in Volume: 74.40% -21.90% 100.00% Classification: Full Thickness With Exposed Partial Thickness Partial Thickness Support Structures Exudate Amount: Medium Large Large Exudate Type: Serous Serous Serous Exudate Color: amber amber amber Foul Odor After Cleansing: Yes No No Odor Anticipated Due to No N/A N/A Product Use: ANNELIESE, GOKHALE ([id number]) Wound Margin: Flat and Intact Flat and Intact Flat and Intact Granulation Amount: Small (1-33%) Large (67-100%) Medium (34-66%) Granulation Quality: Pink Pink Pink Necrotic Amount: Large (67-100%) Small (1-33%) Medium (34-66%) Necrotic Tissue: Eschar, Adherent Slough N/A N/A Exposed Structures: Fat Layer (Subcutaneous Fat Layer  (Subcutaneous Fat Layer (Subcutaneous Tissue) Exposed: Yes Tissue) Exposed: Yes Tissue) Exposed: Yes Muscle: Yes Fascia: No Fascia: No Bone: Yes Tendon: No Tendon: No Fascia: No Muscle: No Muscle: No Tendon: No Joint: No Joint: No Joint: No Bone: No Bone: No Epithelialization: None Medium (34-66%) Small (1-33%) Wound Number: 4 5 N/A Photos: N/A Wound Location: Right Gluteus Left Toe Third N/A Wounding Event: Pressure Injury Gradually Appeared N/A Primary Etiology: Pressure Ulcer Arterial Insufficiency Ulcer N/A Comorbid History: Anemia, Congestive Heart Anemia, Congestive Heart N/A Failure, Coronary Artery Failure, Coronary Artery Disease, Hypertension, Disease, Hypertension, Peripheral Arterial Disease, Peripheral Arterial Disease, Peripheral Venous Disease, Peripheral Venous Disease, End Stage Renal Disease End Stage Renal Disease Date Acquired: 10/15/2018 04/17/2019 N/A Weeks of Treatment: 4 2 N/A Wound Status: Open Open N/A Pending Amputation on No No N/A Presentation: Measurements L x W x D 1.5x0.8x0.1 0.3x0.3x0.1 N/A (cm) Area (cm) : 0.942 0.071 N/A Volume (cm) : 0.094 0.007 N/A % Reduction in Area: 85.70% 98.00% N/A % Reduction in Volume: 85.80% 98.00% N/A Classification: Category/Stage II Unclassifiable N/A Exudate Amount: Medium Medium N/A Exudate Type: Serous Serous N/A Exudate Color: amber amber N/A Foul Odor After Cleansing: No No N/A Odor Anticipated Due to N/A N/A N/A Product Use: Wound Margin: Flat and Intact Indistinct, nonvisible N/A Granulation Amount: Large (67-100%) None Present (0%) N/A Granulation Quality: Pink N/A N/A Necrotic Amount: None Present (0%) Large (67-100%) N/A Necrotic Tissue: N/A Eschar N/A Exposed Structures: Fat Layer (Subcutaneous Fascia: No N/A Tissue) Exposed: Yes Fat Layer (Subcutaneous Sandy Roberson, Sandy Roberson ([id number]) Fascia: No Tissue) Exposed: No Tendon: No Tendon: No Muscle: No Muscle: No Joint: No Joint:  No Bone: No Bone: No Epithelialization: None None N/A Treatment Notes Electronic Signature(s) Signed: 05/22/2019 1:41:09 PM By: Montey Hora Entered By: Montey Hora on 05/22/2019 11:28:36 Sandy Roberson ([id number]) -------------------------------------------------------------------------------- Multi-Disciplinary Care Plan Details Patient Name: Sandy Roberson Date of Service: 05/22/2019 10:45 AM Medical Record Number: [id number] Patient Account Number: [id number] Date of Birth/Sex: 08-Dec-1915 (86 y.o. F) Treating RN: Montey Hora Primary Care Kiara Keep: Tracie Harrier Other Clinician: Referring Shayley Medlin: Tracie Harrier Treating Samaia Iwata/Extender: Melburn Hake, HOYT Weeks in Treatment: 7 Active Inactive Venous Leg Ulcer Nursing Diagnoses: Knowledge deficit related to disease process and management Goals: Patient/caregiver will verbalize understanding of disease process and disease management Date Initiated: 03/30/2019 Target Resolution Date: 03/30/2019 Goal Status: Active Interventions: Assess peripheral edema status every visit. Notes: Wound/Skin Impairment Nursing Diagnoses: Impaired tissue integrity Knowledge deficit related to ulceration/compromised skin integrity Goals: Ulcer/skin breakdown will have a volume reduction of 30% by week 4 Date Initiated: 03/30/2019 Target  Resolution Date: 04/30/2019 Goal Status: Active Interventions: Assess patient/caregiver ability to obtain necessary supplies Assess patient/caregiver ability to perform ulcer/skin care regimen upon admission and as needed Assess ulceration(s) every visit Provide education on ulcer and skin care Notes: Electronic Signature(s) Signed: 05/22/2019 1:41:09 PM By: Montey Hora Entered By: Montey Hora on 05/22/2019 11:28:27 Sandy Roberson ([id number]) -------------------------------------------------------------------------------- Pain Assessment Details Patient Name: Sandy Roberson Date of Service: 05/22/2019 10:45 AM Medical Record Number: [id number] Patient Account Number: [id number] Date of Birth/Sex: 24-Aug-1915 (86 y.o. F) Treating RN: Montey Hora Primary Care Brandace Cargle: Tracie Harrier Other Clinician: Referring Travarius Lange: Tracie Harrier Treating Sentoria Brent/Extender: Melburn Hake, HOYT Weeks in Treatment: 7 Active Problems Location of Pain Severity and Description of Pain Patient Has Paino No Site Locations Pain Management and Medication Current Pain Management: Electronic Signature(s) Signed: 05/22/2019 1:41:09 PM By: Montey Hora Signed: 05/22/2019 2:17:28 PM By: Lorine Bears RCP, RRT, CHT Entered By: Lorine Bears on 05/22/2019 10:50:27 Sandy Roberson ([id number]) -------------------------------------------------------------------------------- Patient/Caregiver Education Details Patient Name: Sandy Roberson Date of Service: 05/22/2019 10:45 AM Medical Record Number: [id number] Patient Account Number: [id number] Date of Birth/Gender: May 07, 1915 (86 y.o. F) Treating RN: Montey Hora Primary Care Physician: Tracie Harrier Other Clinician: Referring Physician: Tracie Harrier Treating Physician/Extender: Sharalyn Ink in Treatment: 7 Education Assessment Education Provided To: Patient and Caregiver Education Topics Provided Wound/Skin Impairment: Handouts: Other: wound care as ordered Methods: Demonstration, Explain/Verbal Responses: State content correctly Electronic Signature(s) Signed: 05/22/2019 1:41:09 PM By: Montey Hora Entered By: Montey Hora on 05/22/2019 11:33:47 Sandy Roberson ([id number]) -------------------------------------------------------------------------------- Wound Assessment Details Patient Name: Sandy Roberson Date of Service: 05/22/2019 10:45 AM Medical Record Number: [id number] Patient Account Number: [id number] Date of Birth/Sex: 10/11/1915 (86  y.o. F) Treating RN: Montey Hora Primary Care Rohini Jaroszewski: Tracie Harrier Other Clinician: Referring Otilio Groleau: Tracie Harrier Treating Alane Hanssen/Extender: Melburn Hake, HOYT Weeks in Treatment: 7 Wound Status Wound Number: 1 Primary Arterial Insufficiency Ulcer Etiology: Wound Location: Right, Medial Metatarsal head first Wound Open Wounding Event: Gradually Appeared Status: Date Acquired: 03/16/2018 Comorbid Anemia, Congestive Heart Failure, Coronary Weeks Of Treatment: 7 History: Artery Disease, Hypertension, Peripheral Arterial Clustered Wound: No Disease, Peripheral Venous Disease, End Stage Pending Amputation On Presentation Renal Disease Photos Wound Measurements Length: (cm) 2.9 Width: (cm) 2.4 Depth: (cm) 1 Area: (cm) 5.466 Volume: (cm) 5.466 % Reduction in Area: 48.8% % Reduction in Volume: 74.4% Epithelialization: None Tunneling: No Undermining: No Wound Description Full Thickness With Exposed Support Classification: Structures Wound Margin: Flat and Intact Exudate Medium Amount: Exudate Type: Serous Exudate Color: amber Foul Odor After Cleansing: Yes Due to Product Use: No Slough/Fibrino Yes Wound Bed Granulation Amount: Small (1-33%) Exposed Structure Granulation Quality: Pink Fascia Exposed: No Necrotic Amount: Large (67-100%) Fat Layer (Subcutaneous Tissue) Exposed: Yes Necrotic Quality: Eschar, Adherent Slough Tendon Exposed: No Muscle Exposed: Yes Necrosis of Muscle: Yes Joint Exposed: No Limas, Elyce ([id number]) Bone Exposed: Yes Treatment Notes Wound #1 (Right, Medial Metatarsal head first) Notes iodoflex to right foot with abd and conform (zinc) periwound, silver ag to leg leg wounds and toe with conform, Zinc to sacrum Electronic Signature(s) Signed: 05/22/2019 1:41:09 PM By: Montey Hora Previous Signature: 05/22/2019 11:11:31 AM Version By: Army Melia Entered By: Montey Hora on 05/22/2019 11:28:17 Sandy Roberson ([id number]) -------------------------------------------------------------------------------- Wound Assessment Details Patient Name: Sandy Roberson Date of Service: 05/22/2019 10:45 AM Medical Record Number: [id number] Patient Account Number: [id number] Date of Birth/Sex: Feb 27, 1916 (86 y.o. F) Treating RN: Montey Hora Primary Care Xaviar Lunn: Tracie Harrier Other Clinician: Referring Analyse Angst: Tracie Harrier Treating  Davit Vassar/Extender: Melburn Hake, HOYT Weeks in Treatment: 7 Wound Status Wound Number: 2 Primary Venous Leg Ulcer Etiology: Wound Location: Left, Medial Lower Leg Wound Open Wounding Event: Blister Status: Date Acquired: 04/18/2019 Comorbid Anemia, Congestive Heart Failure, Coronary Weeks Of Treatment: 4 History: Artery Disease, Hypertension, Peripheral Arterial Clustered Wound: No Disease, Peripheral Venous Disease, End Stage Renal Disease Photos Wound Measurements Length: (cm) 4 % Reduction in Width: (cm) 3.9 % Reduction in Depth: (cm) 0.1 Epithelializat Area: (cm) 12.252 Volume: (cm) 1.225 Area: -21.9% Volume: -21.9% ion: Medium (34-66%) Wound Description Classification: Partial Thickness Foul Odor Afte Wound Margin: Flat and Intact Slough/Fibrino Exudate Amount: Large Exudate Type: Serous Exudate Color: amber r Cleansing: No No Wound Bed Granulation Amount: Large (67-100%) Exposed Structure Granulation Quality: Pink Fascia Exposed: No Necrotic Amount: Small (1-33%) Fat Layer (Subcutaneous Tissue) Exposed: Yes Tendon Exposed: No Muscle Exposed: No Joint Exposed: No Bone Exposed: No Sandy Roberson, Sandy Roberson ([id number]) Treatment Notes Wound #2 (Left, Medial Lower Leg) Notes iodoflex to right foot with abd and conform (zinc) periwound, silver ag to leg leg wounds and toe with conform, Zinc to sacrum Electronic Signature(s) Signed: 05/22/2019 1:41:09 PM By: Montey Hora Previous Signature: 05/22/2019 11:11:51 AM Version By: Army Melia Entered By: Montey Hora on 05/22/2019 11:28:18 Sandy Roberson ([id number]) -------------------------------------------------------------------------------- Wound Assessment Details Patient Name: Sandy Roberson Date of Service: 05/22/2019 10:45 AM Medical Record Number: [id number] Patient Account Number: [id number] Date of Birth/Sex: 18-Aug-1915 (86 y.o. F) Treating RN: Montey Hora Primary Care Zailah Zagami: Tracie Harrier Other Clinician: Referring Jolicia Delira: Tracie Harrier Treating Izabellah Dadisman/Extender: Melburn Hake, HOYT Weeks in Treatment: 7 Wound Status Wound Number: 3 Primary Venous Leg Ulcer Etiology: Wound Location: Left, Medial Ankle Wound Healed - Epithelialized Wounding Event: Blister Status: Date Acquired: 04/18/2019 Comorbid Anemia, Congestive Heart Failure, Coronary Weeks Of Treatment: 4 History: Artery Disease, Hypertension, Peripheral Arterial Clustered Wound: No Disease, Peripheral Venous Disease, End Stage Renal Disease Photos Wound Measurements Length: (cm) 0 % Reductio Width: (cm) 0 % Reductio Depth: (cm) 0 Epithelial Area: (cm) 0 Volume: (cm) 0 n in Area: 100% n in Volume: 100% ization: Small (1-33%) Wound Description Classification: Partial Thickness Foul Odor Wound Margin: Flat and Intact Slough/Fib Exudate Amount: Large Exudate Type: Serous Exudate Color: amber After Cleansing: No rino No Wound Bed Granulation Amount: Medium (34-66%) Exposed Structure Granulation Quality: Pink Fascia Exposed: No Necrotic Amount: Medium (34-66%) Fat Layer (Subcutaneous Tissue) Exposed: Yes Tendon Exposed: No Muscle Exposed: No Joint Exposed: No Bone Exposed: No Sandy Roberson, Sandy Roberson ([id number]) Electronic Signature(s) Signed: 05/22/2019 1:41:09 PM By: Montey Hora Previous Signature: 05/22/2019 11:12:34 AM Version By: Army Melia Entered By: Montey Hora on 05/22/2019 11:28:19 Sandy Roberson  ([id number]) -------------------------------------------------------------------------------- Wound Assessment Details Patient Name: Sandy Roberson Date of Service: 05/22/2019 10:45 AM Medical Record Number: [id number] Patient Account Number: [id number] Date of Birth/Sex: 1915/06/09 (86 y.o. F) Treating RN: Montey Hora Primary Care Makhi Muzquiz: Tracie Harrier Other Clinician: Referring Andrienne Havener: Tracie Harrier Treating Demyan Fugate/Extender: Melburn Hake, HOYT Weeks in Treatment: 7 Wound Status Wound Number: 4 Primary Pressure Ulcer Etiology: Wound Location: Right Gluteus Wound Open Wounding Event: Pressure Injury Status: Date Acquired: 10/15/2018 Comorbid Anemia, Congestive Heart Failure, Coronary Weeks Of Treatment: 4 History: Artery Disease, Hypertension, Peripheral Arterial Clustered Wound: No Disease, Peripheral Venous Disease, End Stage Renal Disease Photos Wound Measurements Length: (cm) 1.5 % Reduction in Width: (cm) 0.8 % Reduction in Depth: (cm) 0.1 Epithelializat Area: (cm) 0.942 Volume: (cm) 0.094 Area: 85.7% Volume: 85.8% ion: None Wound Description Classification: Category/Stage II Foul Odor Afte Wound Margin: Flat and  Intact Slough/Fibrino Exudate Amount: Medium Exudate Type: Serous Exudate Color: amber r Cleansing: No Yes Wound Bed Granulation Amount: Large (67-100%) Exposed Structure Granulation Quality: Pink Fascia Exposed: No Necrotic Amount: None Present (0%) Fat Layer (Subcutaneous Tissue) Exposed: Yes Tendon Exposed: No Muscle Exposed: No Joint Exposed: No Bone Exposed: No Sandy Roberson, Sandy Roberson ([id number]) Treatment Notes Wound #4 (Right Gluteus) Notes iodoflex to right foot with abd and conform (zinc) periwound, silver ag to leg leg wounds and toe with conform, Zinc to sacrum Electronic Signature(s) Signed: 05/22/2019 1:41:09 PM By: Montey Hora Previous Signature: 05/22/2019 11:12:53 AM Version By: Army Melia Entered By: Montey Hora on 05/22/2019 11:28:20 Sandy Roberson ([id number]) -------------------------------------------------------------------------------- Wound Assessment Details Patient Name: Sandy Roberson Date of Service: 05/22/2019 10:45 AM Medical Record Number: [id number] Patient Account Number: [id number] Date of Birth/Sex: [birth year] (86 y.o. F) Treating RN: Montey Hora Primary Care Wm Sahagun: Tracie Harrier Other Clinician: Referring Chay Mazzoni: Tracie Harrier Treating Godwin Tedesco/Extender: Melburn Hake, HOYT Weeks in Treatment: 7 Wound Status Wound Number: 5 Primary Arterial Insufficiency Ulcer Etiology: Wound Location: Left Toe Third Wound Open Wounding Event: Gradually Appeared Status: Date Acquired: 04/17/2019 Comorbid Anemia, Congestive Heart Failure, Coronary Weeks Of Treatment: 2 History: Artery Disease, Hypertension, Peripheral Arterial Clustered Wound: No Disease, Peripheral Venous Disease, End Stage Renal Disease Photos Wound Measurements Length: (cm) 0.3 % Reduction i Width: (cm) 0.3 % Reduction i Depth: (cm) 0.1 Epithelializa Area: (cm) 0.071 Volume: (cm) 0.007 n Area: 98% n Volume: 98% tion: None Wound Description Classification: Unclassifiable Foul Odor Aft Wound Margin: Indistinct, nonvisible Slough/Fibrin Exudate Amount: Medium Exudate Type: Serous Exudate Color: amber er Cleansing: No o No Wound Bed Granulation Amount: None Present (0%) Exposed Structure Necrotic Amount: Large (67-100%) Fascia Exposed: No Necrotic Quality: Eschar Fat Layer (Subcutaneous Tissue) Exposed: No Tendon Exposed: No Muscle Exposed: No Joint Exposed: No Bone Exposed: No Sandy Roberson, Sandy Roberson ([id number]) Treatment Notes Wound #5 (Left Toe Third) Notes iodoflex to right foot with abd and conform (zinc) periwound, silver ag to leg leg wounds and toe with conform, Zinc to sacrum Electronic Signature(s) Signed: 05/22/2019 1:41:09 PM By: Montey Hora Previous Signature:  05/22/2019 11:13:11 AM Version By: Army Melia Entered By: Montey Hora on 05/22/2019 11:28:20 Sandy Roberson ([id number]) -------------------------------------------------------------------------------- [place] Details Patient Name: Sandy Roberson Date of Service: 05/22/2019 10:45 AM Medical Record Number: [id number] Patient Account Number: [id number] Date of Birth/Sex: 10/19/1915 (86 y.o. F) Treating RN: Montey Hora Primary Care Zelphia Glover: Tracie Harrier Other Clinician: Referring Danecia Underdown: Tracie Harrier Treating [place] Giammona/Extender: Melburn Hake, HOYT Weeks in Treatment: 7 Vital Signs Time Taken: 10:50 Temperature (F): 98.3 Height (in): 63 Pulse (bpm): 89 Weight (lbs): 118 Respiratory Rate (breaths/min): 16 Body Mass Index (BMI): 20.9 Blood Pressure (mmHg): 125/48 Reference Range: 80 - 120 mg / dl Electronic Signature(s) Signed: 05/22/2019 2:17:28 PM By: Lorine Bears RCP, RRT, CHT Entered By: Lorine Bears on 05/22/2019 10:50:51

## 2019-05-22 NOTE — Progress Notes (Addendum)
Sandy Roberson ([id number]) Visit Report for 05/22/2019 Chief Complaint Document Details Patient Name: Sandy Roberson Date of Service: 05/22/2019 10:45 AM Medical Record Number: [id number] Patient Account Number: [id number] Date of Birth/Sex: September 25, 1915 (86 y.o. F) Treating RN: Montey Hora Primary Care Provider: Tracie Harrier Other Clinician: Referring Provider: Tracie Harrier Treating Provider/Extender: Melburn Hake, Jaquari Reckner Weeks in Treatment: 7 Information Obtained from: Patient Chief Complaint Right medial foot surgical ulcer, Left LE ulcers, and right gluteal ulcer Electronic Signature(s) Signed: 05/22/2019 10:52:16 AM By: Worthy Keeler PA-C Entered By: Worthy Keeler on 05/22/2019 10:52:15 Sandy Roberson ([id number]) -------------------------------------------------------------------------------- HPI Details Patient Name: Sandy Roberson Date of Service: 05/22/2019 10:45 AM Medical Record Number: [id number] Patient Account Number: [id number] Date of Birth/Sex: April 09, 1916 (86 y.o. F) Treating RN: Montey Hora Primary Care Provider: Tracie Harrier Other Clinician: Referring Provider: Tracie Harrier Treating Provider/Extender: Melburn Hake, Zuriah Bordas Weeks in Treatment: 7 History of Present Illness HPI Description: 03/30/2019 on evaluation today patient presents for initial evaluation here in our clinic as a referral from Dr. Selina Cooley office who is a local podiatrist. Unfortunately the patient has experienced significant issues with decreased ability to heal in regard to a surgical site and again this is a fairly complicated history. The patient has undergone 3 vascular interventions the most recent of which was actually performed on 12//20. This was subsequent to the patient having ABIs which were somewhat poor registering at 0.67 on the right with a TBI of 0.16 and on the left she was 0.77 with a TBI of 0.24. Subsequently the patient had had surgery to  actually remove a portion of the first metatarsal on her right foot. They did not remove the toe although again from the patient's standpoint I am not 100% sure exactly the reasoning behind what was done or was not done but again I think a big part of the issue was trying to be as minimally invasive for the patient as possible do #1 to her arterial status #2 due to her age. Nonetheless unfortunately she has had some trouble with healing and when the sutures were removed the wound apparently dehisced. The patient does currently have more recent vascular intervention which again was on December 4 with Dr. Lazaro Arms and at this point the patient did have angioplasty which upon completion showed inline flow into the foot with less than 20% residual stenosis after angioplasties. There were multiple sites which had to be worked on during the procedure by Dr. Lucky Cowboy. Again the patient has very problematic blood flow with regard to her arterial status at this point. She does have a history of dysphagia, stage III kidney disease, and congestive heart failure as well. 04/06/2019 on evaluation today patient appears to be doing in my opinion about the same there may be slightly better with regard to the overall. With that being said she still is healing slowly obviously has only been 1 week and now the wounds do appear to be clean enough I still think there is can be quite a bit of work to do going forward before will be where we really need to be here. She does have an appointment with vascular on Wednesday which is just 2 days away for repeat vascular studies along with evaluation with the physician as well. This will be good information for [place] to know going forward as far as her arterial status and what we can or cannot do from the standpoint of debridement or otherwise. 12/28-Patient returns at 1 week with the right medial metatarsal wound,  this appears to be better with the rim of granulation tissue noted. We have  been using Iodoflex. Patient had vascular studies done that indicate good flow to this foot, the left foot has ABI of 0.59 and a TBI 0.34 and patient has vascular follow-up for revascularization efforts. 04/20/2019 on evaluation today patient's right first metatarsal ulcer actually appears to be doing about the same is making slow progress. With that being said she unfortunately has blisters over the left lower extremity, left medial ankle, and she still has apparently a pressure ulcer in the gluteal region which is in the right gluteus and is completely new to [place]. This is something that was not known of previous. In fact they did not even tell us about it today until at the end of the visit when we were getting ready to go and her son mention this. 04/27/2019 upon evaluation today patient appears to be doing excellent with regard to her wounds all things considered. I do feel like she is making some progress here she did have her vascular intervention/angiogram which was on 04/23/2019. With that being said I do not have the records for review today as the note is not actually in yet with looking in the system. Nonetheless Dr. Lucky Cowboy did perform this procedure. The patient has had some increased swelling since the procedure in the left lower extremity but this is to be expected. Obviously that will cause the wounds to drain more. Nonetheless overall I do not see any signs of active infection. Her right foot ulcer seems to be making progress albeit slow. 05/08/2019 on evaluation today patient appears to be doing well with regard to her wounds in general. I feel like that other than the fact that her toe ulcer on the left third toe worsened from where she had just a small blood blister to where it actually developed into more of a wound. This is something that now hopefully should reverse now that she has had the intervention on the left side and hopefully is better able to heal some of these wound areas in  fact I really feel like a lot is drying up and appearing much better than what it was previous. Fortunately there is no signs of active infection at this time. No fevers, chills, nausea, vomiting, or diarrhea. 05/15/2019 on evaluation today patient appears to be doing okay in general with regard to her wounds. The wound in the first [place] ([id number]) metatarsal region on the right seems to be doing quite well. I see definite improvement. The lower extremity ulcers both appear to be doing well on the leg as well as the ankle on the left and the right gluteal ulcer is very dry I think we may need to use collagen and a dressing to keep this more moist allow to heal more appropriately. 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 05/22/2019 upon evaluation today patient actually appears to be making some progress here which is excellent news. She is specifically having good improvement with regard to her left ankle which appears healed the left lower extremity is making good progress. The left toe also seems to be improving. All of this I think was due to the procedure or rather lack of procedure on this leg that she finally had  done she did have a follow-up with vascular and they noted that her blood flow is now excellent following her vascular intervention. Nonetheless overall I am very pleased. Her right foot ulcer is doing much better and I do feel like it is healing quite nicely is just taking its time but nonetheless were seeing improvement. The gluteal wound also appears to be smaller and better today overall I am very pleased with how things are progressing. Electronic Signature(s) Signed: 05/22/2019 11:32:28 AM By: Worthy Keeler PA-C Entered By: Worthy Keeler on 05/22/2019 11:32:28 Sandy Roberson, Sandy Roberson ([id number]) -------------------------------------------------------------------------------- Physical Exam Details Patient Name: Sandy Roberson Date of Service: 05/22/2019 10:45 AM Medical Record Number: [id number] Patient Account Number: [id number] Date of Birth/Sex: 1916-04-11 (86 y.o. F) Treating RN: Montey Hora Primary Care Provider: Tracie Harrier Other Clinician: Referring Provider: Tracie Harrier Treating Provider/Extender: STONE III, Neysha Criado Weeks in Treatment: 7 Constitutional Well-nourished and well-hydrated in no acute distress. Respiratory normal breathing without difficulty. Psychiatric this patient is able to make decisions and demonstrates good insight into disease process. Alert and Oriented x 3. pleasant and cooperative. Notes Patient's wounds currently all showed signs of improvement. I am very pleased with this. No sharp debridement was needed at any location today which is also excellent news. I do believe that I am likely going to continue with the wound orders for each wound as previously discussed that seems to be doing a good job. Electronic Signature(s) Signed: 05/22/2019 11:32:49 AM By: Worthy Keeler PA-C Entered By: Worthy Keeler on 05/22/2019 11:32:48 Sandy Roberson ([id number]) -------------------------------------------------------------------------------- Physician Orders Details Patient Name: Sandy Roberson Date of Service: 05/22/2019 10:45 AM Medical Record Number: [id number] Patient Account Number: [id number] Date of Birth/Sex: 09/24/1915 (86 y.o. F) Treating RN: Montey Hora Primary Care Provider: Tracie Harrier Other Clinician: Referring Provider: Tracie Harrier Treating Provider/Extender: Melburn Hake, Jamorion Gomillion Weeks in Treatment: 7 Verbal / Phone Orders: No Diagnosis Coding ICD-10 Coding Code Description I73.89 Other specified peripheral vascular diseases T81.31XA Disruption of external operation (surgical)  wound, not elsewhere classified, initial encounter L97.513 Non-pressure chronic ulcer of other part of right foot with necrosis of muscle L89.312 Pressure ulcer of right buttock, stage 2 L97.821 Non-pressure chronic ulcer of other part of left lower leg limited to breakdown of skin 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 R13.12 Dysphagia, oropharyngeal phase N18.30 Chronic kidney disease, stage 3 unspecified I50.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 (congestive) heart failure Wound Cleansing Wound #1 Right,Medial Metatarsal head first o Dial antibacterial soap, wash wounds, rinse and pat dry prior to dressing wounds Wound #2 Left,Medial Lower Leg o Dial antibacterial soap, wash wounds, rinse and pat dry prior to dressing wounds Wound #4 Right Gluteus o Dial antibacterial soap, wash wounds, rinse and pat dry prior to dressing wounds Wound #5 Left Toe Third o Dial antibacterial soap, wash wounds, rinse and pat dry prior to dressing wounds Skin Barriers/Peri-Wound Care Wound #4 Right Gluteus o Barrier cream Primary Wound Dressing Wound #1 Right,Medial Metatarsal head first o Iodoflex Wound #2 Left,Medial Lower Leg o Silver Alginate Wound #5 Left Toe Third o Silver Alginate Wound #4 Right Gluteus o Silver Collagen Wiedel, Sandy Roberson ([id number]) Secondary Dressing Wound #1 Right,Medial Metatarsal head first o ABD pad o Conform/Kerlix - conform to secure Wound #2 Left,Medial Lower Leg o ABD pad o Conform/Kerlix - conform to secure Wound #5 Left Toe Third o Other - bandaid Wound #4 Right Gluteus o Boardered Foam Dressing  Dressing Change Frequency Wound #1 Right,Medial Metatarsal head first o Change Dressing Monday, Wednesday, Friday Wound #2 Left,Medial Lower Leg o Change Dressing Monday, Wednesday, Friday Wound #4 Right Gluteus o Change Dressing  Monday, Wednesday, Friday Wound #5 Left Toe Third o Change Dressing Monday, Wednesday, Friday Follow-up Appointments o Return Appointment in 1 week. Edema Control Wound #2 Left,Medial Lower Leg o Other: - Tubi Grip single layer Home Health Wound #1 Right,Medial Metatarsal head first o [place] Visits - [place] Nurse may visit PRN to address patientos wound care needs. o Home Health Nurse may visit PRN to address patientos wound care needs. o FACE TO FACE ENCOUNTER: MEDICARE and MEDICAID PATIENTS: I certify that this patient is under my care and that I had a face-to-face encounter that meets the physician face-to-face encounter requirements with this patient on this date. The encounter with the patient was in whole or in part for the following MEDICAL CONDITION: (primary reason for [place]) MEDICAL NECESSITY: I certify, that based on my findings, NURSING services are a medically necessary home health service. HOME BOUND STATUS: I certify that my clinical findings support that this patient is homebound (i.e., Due to illness or injury, pt requires aid of supportive devices such as crutches, cane, wheelchairs, walkers, the use of special transportation or the assistance of another person to leave their place of residence. There is a normal inability to leave the home and doing so requires considerable and taxing effort. Other absences are for medical reasons / religious services and are infrequent or of short duration when for other reasons). o FACE TO FACE ENCOUNTER: MEDICARE and MEDICAID PATIENTS: I certify that this patient is under my care and that I had a face-to-face encounter that meets the physician face-to-face encounter requirements with this patient on this date. The encounter with the patient was in whole or in part for the following MEDICAL CONDITION: (primary reason for [place]) MEDICAL NECESSITY: I certify, that based on  my findings, NURSING services are a medically necessary home health service. HOME BOUND STATUS: I certify that my clinical findings support that this patient is homebound (i.e., Due to illness or injury, pt requires aid of Sandy Roberson, Sandy Roberson ([id number]) supportive devices such as crutches, cane, wheelchairs, walkers, the use of special transportation or the assistance of another person to leave their place of residence. There is a normal inability to leave the home and doing so requires considerable and taxing effort. Other absences are for medical reasons / religious services and are infrequent or of short duration when for other reasons). o If current dressing causes regression in wound condition, may D/C ordered dressing product/s and apply Normal Saline Moist Dressing daily until next [place] / Other MD appointment. [place] of regression in wound condition at [phone]. o If current dressing causes regression in wound condition, may D/C ordered dressing product/s and apply Normal Saline Moist Dressing daily until next [place] / Other MD appointment. [place] of regression in wound condition at [phone]. o Please direct any NON-WOUND related issues/requests for orders to patient's Primary Care Physician o Please direct any NON-WOUND related issues/requests for orders to patient's Primary Care Physician Wound #2 Left,Medial Lower Leg o [place] Visits - [place] Nurse may visit PRN to address patientos wound care needs. o FACE TO FACE ENCOUNTER: MEDICARE and MEDICAID PATIENTS: I certify that this patient is under my care and that I had  a face-to-face encounter that meets the physician face-to-face encounter requirements with this patient on this date. The encounter with the patient was in whole or in part for the following MEDICAL CONDITION: (primary reason for [place])  MEDICAL NECESSITY: I certify, that based on my findings, NURSING services are a medically necessary home health service. HOME BOUND STATUS: I certify that my clinical findings support that this patient is homebound (i.e., Due to illness or injury, pt requires aid of supportive devices such as crutches, cane, wheelchairs, walkers, the use of special transportation or the assistance of another person to leave their place of residence. There is a normal inability to leave the home and doing so requires considerable and taxing effort. Other absences are for medical reasons / religious services and are infrequent or of short duration when for other reasons). o If current dressing causes regression in wound condition, may D/C ordered dressing product/s and apply Normal Saline Moist Dressing daily until next [place] / Other MD appointment. [place] of regression in wound condition at [phone]. o Please direct any NON-WOUND related issues/requests for orders to patient's Primary Care Physician Wound #4 Right [place] Visits - [place] Nurse may visit PRN to address patientos wound care needs. o FACE TO FACE ENCOUNTER: MEDICARE and MEDICAID PATIENTS: I certify that this patient is under my care and that I had a face-to-face encounter that meets the physician face-to-face encounter requirements with this patient on this date. The encounter with the patient was in whole or in part for the following MEDICAL CONDITION: (primary reason for [place]) MEDICAL NECESSITY: I certify, that based on my findings, NURSING services are a medically necessary home health service. HOME BOUND STATUS: I certify that my clinical findings support that this patient is homebound (i.e., Due to illness or injury, pt requires aid of supportive devices such as crutches, cane, wheelchairs, walkers, the use of special transportation or  the assistance of another person to leave their place of residence. There is a normal inability to leave the home and doing so requires considerable and taxing effort. Other absences are for medical reasons / religious services and are infrequent or of short duration when for other reasons). o If current dressing causes regression in wound condition, may D/C ordered dressing product/s and apply Normal Saline Moist Dressing daily until next [place] / Other MD appointment. [place] of regression in wound condition at [phone]. o Please direct any NON-WOUND related issues/requests for orders to patient's Primary Care Physician Wound #5 Left Toe [place] Visits - [place] Nurse may visit PRN to address patientos wound care needs. o FACE TO FACE ENCOUNTER: MEDICARE and MEDICAID PATIENTS: I certify that this patient is under my care and that I had a face-to-face encounter that meets the physician face-to-face encounter requirements with this patient on this date. The encounter with the patient was in whole or in part for the following MEDICAL CONDITION: (primary reason for [place]) MEDICAL NECESSITY: I certify, that based on my findings, NURSING services are a medically necessary home health service. HOME BOUND STATUS: I certify that my clinical findings support that this patient is homebound (i.e., Due to illness or injury, pt requires aid of supportive devices such as crutches, cane, wheelchairs, walkers, the use of special transportation or the [place], [place] ([id number]) assistance of another person to leave their place of residence. There is a  normal inability to leave the home and doing so requires considerable and taxing effort. Other absences are for medical reasons / religious services and are infrequent or of short duration when for other reasons). o If current dressing causes regression in wound  condition, may D/C ordered dressing product/s and apply Normal Saline Moist Dressing daily until next [place] / Other MD appointment. [place] of regression in wound condition at [phone]. o Please direct any NON-WOUND related issues/requests for orders to patient's Primary Care Physician Electronic Signature(s) Signed: 05/22/2019 1:41:09 PM By: Montey Hora Signed: 05/22/2019 1:53:22 PM By: Worthy Keeler PA-C Entered By: Montey Hora on 05/22/2019 11:31:28 Sandy Roberson ([id number]) -------------------------------------------------------------------------------- Problem List Details Patient Name: Sandy Roberson Date of Service: 05/22/2019 10:45 AM Medical Record Number: [id number] Patient Account Number: [id number] Date of Birth/Sex: 1915-05-04 (86 y.o. F) Treating RN: Montey Hora Primary Care Provider: Tracie Harrier Other Clinician: Referring Provider: Tracie Harrier Treating Provider/Extender: Melburn Hake, Chavon Lucarelli Weeks in Treatment: 7 Active Problems ICD-10 Evaluated Encounter Code Description Active Date Today Diagnosis I73.89 Other specified peripheral vascular diseases 03/30/2019 No Yes T81.31XA Disruption of external operation (surgical) wound, not 03/30/2019 No Yes elsewhere classified, initial encounter L97.513 Non-pressure chronic ulcer of other part of right foot with 03/30/2019 No Yes necrosis of muscle L89.312 Pressure ulcer of right buttock, stage 2 04/22/2019 No Yes L97.821 Non-pressure chronic ulcer of other part of left lower leg 04/22/2019 No Yes limited to breakdown of skin 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 05/15/2019 No Yes involvement without evidence of necrosis R13.12 Dysphagia, oropharyngeal phase 03/30/2019 No Yes N18.30 Chronic kidney disease, stage 3 unspecified 03/30/2019 No Yes I50.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 0000000 No  Yes (congestive) heart failure Sandy Roberson, Sandy Roberson ([id number]) Inactive Problems Resolved Problems Electronic Signature(s) Signed: 05/22/2019 10:52:09 AM By: Worthy Keeler PA-C Entered By: Worthy Keeler on 05/22/2019 10:52:08 Sandy Roberson ([id number]) -------------------------------------------------------------------------------- Progress Note Details Patient Name: Sandy Roberson Date of Service: 05/22/2019 10:45 AM Medical Record Number: [id number] Patient Account Number: [id number] Date of Birth/Sex: Oct 30, 1915 (86 y.o. F) Treating RN: Montey Hora Primary Care Provider: Tracie Harrier Other Clinician: Referring Provider: Tracie Harrier Treating Provider/Extender: Melburn Hake, Nia Nathaniel Weeks in Treatment: 7 Subjective Chief Complaint Information obtained from Patient Right medial foot surgical ulcer, Left LE ulcers, and right gluteal ulcer History of Present Illness (HPI) 03/30/2019 on evaluation today patient presents for initial evaluation here in our clinic as a referral from Dr. Selina Cooley office who is a local podiatrist. Unfortunately the patient has experienced significant issues with decreased ability to heal in regard to a surgical site and again this is a fairly complicated history. The patient has undergone 3 vascular interventions the most recent of which was actually performed on 12//20. This was subsequent to the patient having ABIs which were somewhat poor registering at 0.67 on the right with a TBI of 0.16 and on the left she was 0.77 with a TBI of 0.24. Subsequently the patient had had surgery to actually remove a portion of the first metatarsal on her right foot. They did not remove the toe although again from the patient's standpoint I am not 100% sure exactly the reasoning behind what was done or was not done but again I think a big part of the issue was trying to be as minimally invasive for the patient as possible do #1 to her arterial status #2  due to her age. Nonetheless unfortunately she has had some  trouble with healing and when the sutures were removed the wound apparently dehisced. The patient does currently have more recent vascular intervention which again was on December 4 with Dr. Lazaro Arms and at this point the patient did have angioplasty which upon completion showed inline flow into the foot with less than 20% residual stenosis after angioplasties. There were multiple sites which had to be worked on during the procedure by Dr. Lucky Cowboy. Again the patient has very problematic blood flow with regard to her arterial status at this point. She does have a history of dysphagia, stage III kidney disease, and congestive heart failure as well. 04/06/2019 on evaluation today patient appears to be doing in my opinion about the same there may be slightly better with regard to the overall. With that being said she still is healing slowly obviously has only been 1 week and now the wounds do appear to be clean enough I still think there is can be quite a bit of work to do going forward before will be where we really need to be here. She does have an appointment with vascular on Wednesday which is just 2 days away for repeat vascular studies along with evaluation with the physician as well. This will be good information for [place] to know going forward as far as her arterial status and what we can or cannot do from the standpoint of debridement or otherwise. 12/28-Patient returns at 1 week with the right medial metatarsal wound, this appears to be better with the rim of granulation tissue noted. We have been using Iodoflex. Patient had vascular studies done that indicate good flow to this foot, the left foot has ABI of 0.59 and a TBI 0.34 and patient has vascular follow-up for revascularization efforts. 04/20/2019 on evaluation today patient's right first metatarsal ulcer actually appears to be doing about the same is making slow progress. With that being said  she unfortunately has blisters over the left lower extremity, left medial ankle, and she still has apparently a pressure ulcer in the gluteal region which is in the right gluteus and is completely new to [place]. This is something that was not known of previous. In fact they did not even tell us about it today until at the end of the visit when we were getting ready to go and her son mention this. 04/27/2019 upon evaluation today patient appears to be doing excellent with regard to her wounds all things considered. I do feel like she is making some progress here she did have her vascular intervention/angiogram which was on 04/23/2019. With that being said I do not have the records for review today as the note is not actually in yet with looking in the system. Nonetheless Dr. Lucky Cowboy did perform this procedure. The patient has had some increased swelling since the procedure in the left lower extremity but this is to be expected. Obviously that will cause the wounds to drain more. Nonetheless overall I do not see any signs of active infection. Her right foot ulcer seems to be making progress albeit slow. 05/08/2019 on evaluation today patient appears to be doing well with regard to her wounds in general. I feel like that other than the fact that her toe ulcer on the left third toe worsened from where she had just a small blood blister to where it actually Sandy Roberson, Sandy Roberson ([id number]) developed into more of a wound. This is something that now hopefully should reverse now that she has had the intervention on the left side  and hopefully is better able to heal some of these wound areas in fact I really feel like a lot is drying up and appearing much better than what it was previous. Fortunately there is no signs of active infection at this time. No fevers, chills, nausea, vomiting, or diarrhea. 05/15/2019 on evaluation today patient appears to be doing okay in general with regard to her wounds. The wound in the  first metatarsal region on the right seems to be doing quite well. I see definite improvement. The lower extremity ulcers both appear to be doing well on the leg as well as the ankle on the left and the right gluteal ulcer is very dry I think we may need to use collagen and a dressing to keep this more moist allow to heal more appropriately. 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 05/22/2019 upon evaluation today patient actually appears to be making some progress here which is excellent news. She is specifically having good improvement with regard to her left ankle which appears healed the left lower extremity is making good progress. The left toe also seems to be improving. All of this I think was due to the procedure or rather lack of procedure on this leg that she finally had done she did have a follow-up with vascular and they noted that her blood flow is now excellent following her vascular intervention. Nonetheless overall I am very pleased. Her right foot ulcer is doing much better and I do feel like it is healing quite nicely is just taking its time but nonetheless were seeing improvement. The gluteal wound also appears to be smaller and better today overall I am very pleased with how things are progressing. Objective Constitutional Well-nourished and well-hydrated in no acute distress. Vitals Time Taken: 10:50 AM, Height: 63 in, Weight: 118 lbs, BMI: 20.9, Temperature: 98.3 F, Pulse: 89 bpm, Respiratory Rate: 16 breaths/min, Blood Pressure: 125/48 mmHg. Respiratory normal breathing without difficulty. Psychiatric this patient is able to make decisions and demonstrates good insight into disease process. Alert and Oriented x 3. pleasant and cooperative. General Notes: Patient's wounds currently all  showed signs of improvement. I am very pleased with this. No sharp debridement was needed at any location today which is also excellent news. I do believe that I am likely going to continue with the wound orders for each wound as previously discussed that seems to be doing a good job. Integumentary (Hair, Skin) Wound #1 status is Open. Original cause of wound was Gradually Appeared. The wound is located on the Right,Medial Metatarsal head first. The wound measures 2.9cm length x 2.4cm width x 1cm depth; 5.466cm^2 area and 5.466cm^3 volume. There is bone, muscle, and Fat Layer (Subcutaneous Tissue) Exposed exposed. There is no tunneling or undermining noted. There is a medium amount of serous drainage noted. Foul odor after cleansing was noted. The wound margin is flat and intact. There is small (1-33%) pink granulation within the wound bed. There is a large (67-100%) amount of necrotic tissue within the wound bed including Eschar, Adherent Slough and Necrosis of Muscle. Wound #2 status is Open. Original cause of wound was Blister. The wound is located on the Left,Medial Lower Leg. The wound measures 4cm length x 3.9cm width x 0.1cm depth; 12.252cm^2 area and 1.225cm^3 volume.  There is Fat Layer Sandy Roberson, Sandy Roberson ([id number]) (Subcutaneous Tissue) Exposed exposed. There is a large amount of serous drainage noted. The wound margin is flat and intact. There is large (67-100%) pink granulation within the wound bed. There is a small (1-33%) amount of necrotic tissue within the wound bed. Wound #3 status is Healed - Epithelialized. Original cause of wound was Blister. The wound is located on the Left,Medial Ankle. The wound measures 0cm length x 0cm width x 0cm depth; 0cm^2 area and 0cm^3 volume. There is Fat Layer (Subcutaneous Tissue) Exposed exposed. There is a large amount of serous drainage noted. The wound margin is flat and intact. There is medium (34-66%) pink granulation within the wound bed.  There is a medium (34-66%) amount of necrotic tissue within the wound bed. Wound #4 status is Open. Original cause of wound was Pressure Injury. The wound is located on the Right Gluteus. The wound measures 1.5cm length x 0.8cm width x 0.1cm depth; 0.942cm^2 area and 0.094cm^3 volume. There is Fat Layer (Subcutaneous Tissue) Exposed exposed. There is a medium amount of serous drainage noted. The wound margin is flat and intact. There is large (67-100%) pink granulation within the wound bed. There is no necrotic tissue within the wound bed. Wound #5 status is Open. Original cause of wound was Gradually Appeared. The wound is located on the Left Toe Third. The wound measures 0.3cm length x 0.3cm width x 0.1cm depth; 0.071cm^2 area and 0.007cm^3 volume. There is a medium amount of serous drainage noted. The wound margin is indistinct and nonvisible. There is no granulation within the wound bed. There is a large (67-100%) amount of necrotic tissue within the wound bed including Eschar. 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 Dysphagia, oropharyngeal phase Chronic kidney disease, stage 3 unspecified Chronic combined systolic (congestive) and diastolic (congestive) heart failure Plan Wound Cleansing: Wound #1 Right,Medial Metatarsal head first: Dial antibacterial soap, wash wounds, rinse and pat dry prior to dressing wounds Wound #2 Left,Medial Lower Leg: Dial antibacterial soap, wash wounds, rinse and pat dry prior to dressing wounds Wound #4 Right  Gluteus: Dial antibacterial soap, wash wounds, rinse and pat dry prior to dressing wounds Wound #5 Left Toe Third: Dial antibacterial soap, wash wounds, rinse and pat dry prior to dressing wounds Skin Barriers/Peri-Wound Care: Wound #4 Right Gluteus: Barrier cream Primary Wound Dressing: Sandy Roberson, Sandy Roberson ([id number]) Wound #1 Right,Medial Metatarsal head first: Iodoflex Wound #2 Left,Medial Lower Leg: Silver Alginate Wound #5 Left Toe Third: Silver Alginate Wound #4 Right Gluteus: Silver Collagen Secondary Dressing: Wound #1 Right,Medial Metatarsal head first: ABD pad Conform/Kerlix - conform to secure Wound #2 Left,Medial Lower Leg: ABD pad Conform/Kerlix - conform to secure Wound #5 Left Toe Third: Other - bandaid Wound #4 Right Gluteus: Boardered Foam Dressing Dressing Change Frequency: Wound #1 Right,Medial Metatarsal head first: Change Dressing Monday, Wednesday, Friday Wound #2 Left,Medial Lower Leg: Change Dressing Monday, Wednesday, Friday Wound #4 Right Gluteus: Change Dressing Monday, Wednesday, Friday Wound #5 Left Toe Third: Change Dressing Monday, Wednesday, Friday Follow-up Appointments: Return Appointment in 1 week. Edema Control: Wound #2 Left,Medial Lower Leg: Other: - Tubi Grip  single layer Home Health: Wound #1 Right,Medial Metatarsal head first: [place] Nurse may visit PRN to address patient s wound care needs. Home Health Nurse may visit PRN to address patient s wound care needs. FACE TO FACE ENCOUNTER: MEDICARE and MEDICAID PATIENTS: I certify that this patient is under my care and that I had a face-to-face encounter that meets the physician face-to-face encounter requirements with this patient on this date. The encounter with the patient was in whole or in part for the following MEDICAL CONDITION: (primary reason for [place]) MEDICAL NECESSITY: I certify, that based on my findings, NURSING  services are a medically necessary home health service. HOME BOUND STATUS: I certify that my clinical findings support that this patient is homebound (i.e., Due to illness or injury, pt requires aid of supportive devices such as crutches, cane, wheelchairs, walkers, the use of special transportation or the assistance of another person to leave their place of residence. There is a normal inability to leave the home and doing so requires considerable and taxing effort. Other absences are for medical reasons / religious services and are infrequent or of short duration when for other reasons). FACE TO FACE ENCOUNTER: MEDICARE and MEDICAID PATIENTS: I certify that this patient is under my care and that I had a face-to-face encounter that meets the physician face-to-face encounter requirements with this patient on this date. The encounter with the patient was in whole or in part for the following MEDICAL CONDITION: (primary reason for [place]) MEDICAL NECESSITY: I certify, that based on my findings, NURSING services are a medically necessary home health service. HOME BOUND STATUS: I certify that my clinical findings support that this patient is homebound (i.e., Due to illness or injury, pt requires aid of supportive devices such as crutches, cane, wheelchairs, walkers, the use of special transportation or the assistance of another person to leave their place of residence. There is a normal inability to leave the home and doing so requires considerable and taxing effort. Other absences are for medical reasons / religious services and are infrequent or of short duration when for other reasons). If current dressing causes regression in wound condition, may D/C ordered dressing product/s and apply Normal Saline Moist Dressing daily until next [place] / Other MD appointment. [place] of regression in wound condition at [phone]. If current dressing causes regression  in wound condition, may D/C ordered dressing product/s and apply Normal Saline Sandy Roberson, Sandy Roberson ([id number]) Moist Dressing daily until next [place] / Other MD appointment. [place] of regression in wound condition at [phone]. Please direct any NON-WOUND related issues/requests for orders to patient's Primary Care Physician Please direct any NON-WOUND related issues/requests for orders to patient's Primary Care Physician Wound #2 Left,Medial Lower Leg: [place] Nurse may visit PRN to address patient s wound care needs. FACE TO FACE ENCOUNTER: MEDICARE and MEDICAID PATIENTS: I certify that this patient is under my care and that I had a face-to-face encounter that meets the physician face-to-face encounter requirements with this patient on this date. The encounter with the patient was in whole or in part for the following MEDICAL CONDITION: (primary reason for [place]) MEDICAL NECESSITY: I certify, that based on my findings, NURSING services are a medically necessary home health service. HOME BOUND STATUS: I certify that my clinical findings support that this patient is homebound (i.e., Due to illness or injury,  pt requires aid of supportive devices such as crutches, cane, wheelchairs, walkers, the use of special transportation or the assistance of another person to leave their place of residence. There is a normal inability to leave the home and doing so requires considerable and taxing effort. Other absences are for medical reasons / religious services and are infrequent or of short duration when for other reasons). If current dressing causes regression in wound condition, may D/C ordered dressing product/s and apply Normal Saline Moist Dressing daily until next [place] / Other MD appointment. [place] of regression in wound condition at [phone]. Please direct any  NON-WOUND related issues/requests for orders to patient's Primary Care Physician Wound #4 Right Gluteus: [place] Nurse may visit PRN to address patient s wound care needs. FACE TO FACE ENCOUNTER: MEDICARE and MEDICAID PATIENTS: I certify that this patient is under my care and that I had a face-to-face encounter that meets the physician face-to-face encounter requirements with this patient on this date. The encounter with the patient was in whole or in part for the following MEDICAL CONDITION: (primary reason for [place]) MEDICAL NECESSITY: I certify, that based on my findings, NURSING services are a medically necessary home health service. HOME BOUND STATUS: I certify that my clinical findings support that this patient is homebound (i.e., Due to illness or injury, pt requires aid of supportive devices such as crutches, cane, wheelchairs, walkers, the use of special transportation or the assistance of another person to leave their place of residence. There is a normal inability to leave the home and doing so requires considerable and taxing effort. Other absences are for medical reasons / religious services and are infrequent or of short duration when for other reasons). If current dressing causes regression in wound condition, may D/C ordered dressing product/s and apply Normal Saline Moist Dressing daily until next [place] / Other MD appointment. [place] of regression in wound condition at [phone]. Please direct any NON-WOUND related issues/requests for orders to patient's Primary Care Physician Wound #5 Left Toe Third: [place] Nurse may visit PRN to address patient s wound care needs. FACE TO FACE ENCOUNTER: MEDICARE and MEDICAID PATIENTS: I certify that this patient is under my care and that I had a face-to-face encounter that meets the physician face-to-face  encounter requirements with this patient on this date. The encounter with the patient was in whole or in part for the following MEDICAL CONDITION: (primary reason for [place]) MEDICAL NECESSITY: I certify, that based on my findings, NURSING services are a medically necessary home health service. HOME BOUND STATUS: I certify that my clinical findings support that this patient is homebound (i.e., Due to illness or injury, pt requires aid of supportive devices such as crutches, cane, wheelchairs, walkers, the use of special transportation or the assistance of another person to leave their place of residence. There is a normal inability to leave the home and doing so requires considerable and taxing effort. Other absences are for medical reasons / religious services and are infrequent or of short duration when for other reasons). If current dressing causes regression in wound condition, may D/C ordered dressing product/s and apply Normal Saline Moist Dressing daily until next [place] / Other MD appointment. [place] of regression in wound condition at [phone]. Please direct any NON-WOUND related issues/requests for orders to patient's Primary Care Physician  1. We will continue with the Iodoflex for the right foot ulcer which is doing extremely well. 2. I would recommend as well that we also continue with the silver alginate to the left third toe as well as the left lower extremity which seems to be doing very well. Sandy Roberson, Sandy Roberson ([id number]) 3. I am also going to suggest that we continue with the collagen to the gluteal region next week we may switch to using collagen on the toe as well depending on how it appears. We will see patient back for reevaluation in 1 week here in the clinic. If anything worsens or changes patient will contact our office for additional recommendations. Electronic Signature(s) Signed: 05/22/2019 11:33:21 AM By: Worthy Keeler PA-C Entered By: Worthy Keeler on 05/22/2019 11:33:21 Sandy Roberson ([id number]) -------------------------------------------------------------------------------- SuperBill Details Patient Name: Sandy Roberson Date of Service: 05/22/2019 Medical Record Number: [id number] Patient Account Number: [id number] Date of Birth/Sex: [birth year] (86 y.o. F) Treating RN: Montey Hora Primary Care Provider: Tracie Harrier Other Clinician: Referring Provider: Tracie Harrier Treating Provider/Extender: Melburn Hake, Elija Mccamish Weeks in Treatment: 7 Diagnosis Coding ICD-10 Codes Code Description I73.89 Other specified peripheral vascular diseases T81.31XA Disruption of external operation (surgical) wound, not elsewhere classified, initial encounter L97.513 Non-pressure chronic ulcer of other part of right foot with necrosis of muscle L89.312 Pressure ulcer of right buttock, stage 2 L97.821 Non-pressure chronic ulcer of other part of left lower leg limited to breakdown of skin 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 R13.12 Dysphagia, oropharyngeal phase N18.30 Chronic kidney disease, stage 3 unspecified I50.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 (congestive) heart failure Facility Procedures CPT4 Code: [id number] Description: [id number] - WOUND CARE VISIT-LEV 5 EST PT Modifier: Quantity: 1 Physician Procedures CPT4: Description Modifier Quantity Code [id number] - WC PHYS LEVEL 3 - EST PT 1 ICD-10 Diagnosis Description I73.89 Other specified peripheral vascular diseases T81.31XA Disruption of external operation (surgical) wound, not elsewhere classified,  initial encounter L97.513 Non-pressure chronic ulcer of other part of right foot with necrosis of muscle L89.312 Pressure ulcer of right buttock, stage 2 Electronic Signature(s) Signed: 05/22/2019 11:34:00 AM By: Worthy Keeler PA-C Entered By: Worthy Keeler on 05/22/2019 11:34:00

## 2019-05-25 ENCOUNTER — Encounter (INDEPENDENT_AMBULATORY_CARE_PROVIDER_SITE_OTHER): Payer: Self-pay | Admitting: Nurse Practitioner

## 2019-05-25 NOTE — Progress Notes (Signed)
SUBJECTIVE:  Patient ID: Sandy Roberson, female    DOB: 1916-02-26, 86 y.o.   MRN: [id number] Chief Complaint  Patient presents with  . Follow-up    [facility] 4week post angio    HPI  Sandy Roberson is a 86 y.o. female The patient returns to the office for followup and review of the noninvasive studies. There have been no interval changes in lower extremity symptoms. No interval shortening of the patient's claudication distance or development of rest pain symptoms. No new ulcers or wounds have occurred since the last visit.  The patient son does report that the ulceration on her right lower extremity has been healing well and the wound is getting smaller.  The patient also reports she has had some transient episodes of confusion so he stopped her Lipitor and Plavix and concerned that may be making her confusion worse.  There have been no significant changes to the patient's overall health care.  The patient denies amaurosis fugax or recent TIA symptoms. There are no recent neurological changes noted. The patient denies history of DVT, PE or superficial thrombophlebitis. The patient denies recent episodes of angina or shortness of breath.   ABI Rt=1.10 and Lt=1.16  (previous ABI's Rt=1.05 and Lt=0.59) Duplex ultrasound of the right lower extremity reveals triphasic waveforms in the tibial arteries with biphasic waveforms in the anterior tibial artery and monophasic in the posterior tibial artery.  The bilateral toe waveforms are dampened however they are greatly improved from previous studies. Past Medical History:  Diagnosis Date  . Coronary artery disease   . Hyperlipidemia   . Hypertension   . Renal disorder     Past Surgical History:  Procedure Laterality Date  . CORONARY STENT PLACEMENT    . ESOPHAGOGASTRODUODENOSCOPY (EGD) WITH PROPOFOL N/A 10/20/2018   Procedure: ESOPHAGOGASTRODUODENOSCOPY (EGD) WITH PROPOFOL;  Surgeon: Lucilla Lame, MD;  Location: [facility] ENDOSCOPY;   Service: Endoscopy;  Laterality: N/A;  . FRACTURE SURGERY    . LOWER EXTREMITY ANGIOGRAPHY Right 11/24/2018   Procedure: LOWER EXTREMITY ANGIOGRAPHY;  Surgeon: Algernon Huxley, MD;  Location: [place] [facility];  Service: Cardiovascular;  Laterality: Right;  . LOWER EXTREMITY ANGIOGRAPHY Right 01/05/2019   Procedure: LOWER EXTREMITY ANGIOGRAPHY;  Surgeon: Algernon Huxley, MD;  Location: [place] [facility];  Service: Cardiovascular;  Laterality: Right;  . LOWER EXTREMITY ANGIOGRAPHY Left 03/20/2019   Procedure: LOWER EXTREMITY ANGIOGRAPHY;  Surgeon: Algernon Huxley, MD;  Location: [place] [facility];  Service: Cardiovascular;  Laterality: Left;  . LOWER EXTREMITY ANGIOGRAPHY Left 04/23/2019   Procedure: LOWER EXTREMITY ANGIOGRAPHY;  Surgeon: Algernon Huxley, MD;  Location: [place] [facility];  Service: Cardiovascular;  Laterality: Left;  . STENT PLACEMENT RT URETER [facility])     since been removed   . 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 Marital status: Widowed    Spouse name: Not on file  . Number of children: Not on file  . Years of education: Not on file  . Highest education level: Not on file  Occupational History  . Not on file  Tobacco Use  . Smoking status: Never Smoker  . Smokeless tobacco: Never Used  Substance and Sexual Activity  . Alcohol use: No  . Drug use: Never  . Sexual activity: Not Currently  Other Topics Concern  . Not on  file  Social History Narrative  . Not on file   Social Determinants of Health   Financial Resource Strain:   . Difficulty of Paying Living Expenses: Not on file  Food Insecurity:   . Worried About Charity fundraiser in the Last Year: Not on file  . Ran Out of Food in the Last Year: Not on file  Transportation Needs:   . Lack of Transportation (Medical): Not on  file  . Lack of Transportation (Non-Medical): Not on file  Physical Activity:   . Days of Exercise per Week: Not on file  . Minutes of Exercise per Session: Not on file  Stress:   . Feeling of Stress : Not on file  Social Connections:   . Frequency of Communication with Friends and Family: Not on file  . Frequency of Social Gatherings with Friends and Family: Not on file  . Attends Religious Services: Not on file  . Active Member of Clubs or Organizations: Not on file  . Attends Archivist Meetings: Not on file  . Marital Status: Not on file  Intimate Partner Violence:   . Fear of Current or Ex-Partner: Not on file  . Emotionally Abused: Not on file  . Physically Abused: Not on file  . Sexually Abused: Not on file    Family History  Problem Relation Age of Onset  . Heart failure Neg Hx     Allergies  Allergen Reactions  . Penicillins Rash    Did it involve swelling of the face/tongue/throat, SOB, or low BP? No Did it involve sudden or severe rash/hives, skin peeling, or any reaction on the inside of your mouth or nose? No Did you need to seek medical attention at a hospital or doctor's office? No When did it last happen?10 Years If all above answers are "NO", may proceed with cephalosporin use.     Review of Systems   Review of Systems: Negative Unless Checked Constitutional: [] Weight loss  [] Fever  [] Chills Cardiac: [] Chest pain   []  Atrial Fibrillation  [] Palpitations   [] Shortness of breath when laying flat   [x] Shortness of breath with exertion. [] Shortness of breath at rest Vascular:  [] Pain in legs with walking   [] Pain in legs with standing [] Pain in legs when laying flat   [] Claudication    [] Pain in feet when laying flat    [] History of DVT   [] Phlebitis   [] Swelling in legs   [] Varicose veins   [] Non-healing ulcers Pulmonary:   [] Uses home oxygen   [] Productive cough   [] Hemoptysis   [] Wheeze  [] COPD   [] Asthma Neurologic:  [] Dizziness    [] Seizures  [] Blackouts [] History of stroke   [] History of TIA  [] Aphasia   [] Temporary Blindness   [] Weakness or numbness in arm   [] Weakness or numbness in leg Musculoskeletal:   [] Joint swelling   [] Joint pain   [] Low back pain  []  History of Knee Replacement [] Arthritis [] back Surgeries  []  Spinal Stenosis    Hematologic:  [] Easy bruising  [] Easy bleeding   [] Hypercoagulable state   [x] Anemic Gastrointestinal:  [] Diarrhea   [] Vomiting  [] Gastroesophageal reflux/heartburn   [] Difficulty swallowing. [] Abdominal pain Genitourinary:  [x] Chronic kidney disease   [] Difficult urination  [] Anuric   [] Blood in urine [] Frequent urination  [] Burning with urination   [] Hematuria Skin:  [] Rashes   [] Ulcers [x] Wounds Psychological:  [] History of anxiety   []  History of major depression  []  Memory Difficulties      OBJECTIVE:   Physical  Exam  BP (!) 145/92 (BP Location: Right Arm)   Pulse 86   Resp 14   Gen: WD/WN, NAD Head: [place]/AT, No temporalis wasting.  Ear/Nose/Throat: Hard of hearing, nares w/o erythema or drainage Eyes: PER, EOMI, sclera nonicteric.  Neck: Supple, no masses.  No JVD.  Pulmonary:  Good air movement, no use of accessory muscles.  Cardiac: RRR Vascular:  Ulceration right lower extremity, currently bandaged Vessel Right Left  Radial Palpable Palpable  Dorsalis Pedis Not Palpable Not Palpable  Posterior Tibial Not Palpable Not Palpable   Gastrointestinal: soft, non-distended. No guarding/no peritoneal signs.  Musculoskeletal: M/S 5/5 throughout.  No deformity or atrophy.  Neurologic: Pain and light touch intact in extremities.  Symmetrical.  Speech is fluent. Motor exam as listed above. Psychiatric: Judgment intact, Mood & affect appropriate for pt's clinical situation.       ASSESSMENT AND PLAN:  1. Atherosclerosis of native arteries of the extremities with ulceration ([place])  Recommend:  The patient has evidence of atherosclerosis of the lower extremities with  claudication.  The patient does not voice lifestyle limiting changes at this point in time.  Noninvasive studies do not suggest clinically significant change.  No invasive studies, angiography or surgery at this time The patient should continue walking and begin a more formal exercise program.  The patient should continue antiplatelet therapy and aggressive treatment of the lipid abnormalities  Patient son is advised that he should restart her Plavix as this can cause issues with her stent placement.  The patient should continue wearing graduated compression socks 10-15 mmHg strength to control the mild edema.   Patient to follow-up in 3 months with noninvasive studies. 2. Essential hypertension Continue antihypertensive medications as already ordered, these medications have been reviewed and there are no changes at this time.   3. Hyperlipidemia, unspecified hyperlipidemia type Continue statin as ordered and reviewed, no changes at this time    Current Outpatient Medications on File Prior to Visit  Medication Sig Dispense Refill  . aspirin (ASPIRIN 81) 81 MG chewable tablet Chew 1 tablet (81 mg total) by mouth daily. 30 tablet 0  . trimethoprim (TRIMPEX) 100 MG tablet Take 1 tablet (100 mg total) by mouth daily. 14 tablet 0  . atorvastatin (LIPITOR) 10 MG tablet Take 1 tablet (10 mg total) by mouth daily. (Patient not taking: Reported on 05/21/2019) 30 tablet 11  . calcitRIOL (ROCALTROL) 0.25 MCG capsule Take 0.25 mcg by mouth daily.     . carvedilol (COREG) 3.125 MG tablet Take 1 tablet (3.125 mg total) by mouth 2 (two) times daily with a meal. (Patient not taking: Reported on 05/21/2019) 60 tablet 1  . Cholecalciferol (VITAMIN D3) 50 MCG (2000 UT) TABS Take 2,000 Units by mouth daily.     . ciprofloxacin (CIPRO) 250 MG tablet Take 250 mg by mouth 2 (two) times daily.    . clopidogrel (PLAVIX) 75 MG tablet Take 1 tablet (75 mg total) by mouth daily. (Patient not taking: Reported on  05/21/2019) 30 tablet 11  . doxycycline (VIBRA-TABS) 100 MG tablet Take 100 mg by mouth 2 (two) times daily.    . methimazole (TAPAZOLE) 5 MG tablet Take 5 mg by mouth daily.      No current facility-administered medications on file prior to visit.    There are no Patient Instructions on file for this visit. No follow-ups on file.   Kris Hartmann, NP  This note was completed with Sales executive.  Any errors are purely unintentional.

## 2019-05-29 ENCOUNTER — Encounter: Payer: Medicare Other | Admitting: Physician Assistant

## 2019-05-29 ENCOUNTER — Other Ambulatory Visit: Payer: Self-pay

## 2019-05-29 DIAGNOSIS — I7389 Other specified peripheral vascular diseases: Secondary | ICD-10-CM | POA: Diagnosis not present

## 2019-05-29 NOTE — Progress Notes (Addendum)
MAHOGONY, HARRIER ([id number]) Visit Report for 05/29/2019 Chief Complaint Document Details Patient Name: Sandy Roberson, Sandy Roberson Date of Service: 05/29/2019 12:45 PM Medical Record Number: [id number] Patient Account Number: [id number] Date of Birth/Sex: [birth year] (86 y.o. F) Treating RN: Montey Hora Primary Care Provider: Tracie Harrier Other Clinician: Referring Provider: Tracie Harrier Treating Provider/Extender: Melburn Hake, Britteney Ayotte Weeks in Treatment: 8 Information Obtained from: Patient Chief Complaint Right medial foot surgical ulcer, Left LE ulcers, and right gluteal ulcer Electronic Signature(s) Signed: 05/29/2019 1:17:03 PM By: Worthy Keeler PA-C Entered By: Worthy Keeler on 05/29/2019 13:17:03 Sandy Roberson ([id number]) -------------------------------------------------------------------------------- HPI Details Patient Name: Sandy Roberson Date of Service: 05/29/2019 12:45 PM Medical Record Number: [id number] Patient Account Number: [id number] Date of Birth/Sex: [birth year] (86 y.o. F) Treating RN: Montey Hora Primary Care Provider: Tracie Harrier Other Clinician: Referring Provider: Tracie Harrier Treating Provider/Extender: Melburn Hake, Eldine Rencher Weeks in Treatment: 8 History of Present Illness HPI Description: 03/30/2019 on evaluation today patient presents for initial evaluation here in our clinic as a referral from Dr. Selina Cooley office who is a local podiatrist. Unfortunately the patient has experienced significant issues with decreased ability to heal in regard to a surgical site and again this is a fairly complicated history. The patient has undergone 3 vascular interventions the most recent of which was actually performed on 12//20. This was subsequent to the patient having ABIs which were somewhat poor registering at 0.67 on the right with a TBI of 0.16 and on the left she was 0.77 with a TBI of 0.24. Subsequently the patient had had surgery to  actually remove a portion of the first metatarsal on her right foot. They did not remove the toe although again from the patient's standpoint I am not 100% sure exactly the reasoning behind what was done or was not done but again I think a big part of the issue was trying to be as minimally invasive for the patient as possible do #1 to her arterial status #2 due to her age. Nonetheless unfortunately she has had some trouble with healing and when the sutures were removed the wound apparently dehisced. The patient does currently have more recent vascular intervention which again was on December 4 with Dr. Lazaro Arms and at this point the patient did have angioplasty which upon completion showed inline flow into the foot with less than 20% residual stenosis after angioplasties. There were multiple sites which had to be worked on during the procedure by Dr. Lucky Cowboy. Again the patient has very problematic blood flow with regard to her arterial status at this point. She does have a history of dysphagia, stage Roberson kidney disease, and congestive heart failure as well. 04/06/2019 on evaluation today patient appears to be doing in my opinion about the same there may be slightly better with regard to the overall. With that being said she still is healing slowly obviously has only been 1 week and now the wounds do appear to be clean enough I still think there is can be quite a bit of work to do going forward before will be where we really need to be here. She does have an appointment with vascular on Wednesday which is just 2 days away for repeat vascular studies along with evaluation with the physician as well. This will be good information for [place] to know going forward as far as her arterial status and what we can or cannot do from the standpoint of debridement or otherwise. 12/28-Patient returns at 1 week with the right medial metatarsal wound,  this appears to be better with the rim of granulation tissue noted. We have  been using Iodoflex. Patient had vascular studies done that indicate good flow to this foot, the left foot has ABI of 0.59 and a TBI 0.34 and patient has vascular follow-up for revascularization efforts. 04/20/2019 on evaluation today patient's right first metatarsal ulcer actually appears to be doing about the same is making slow progress. With that being said she unfortunately has blisters over the left lower extremity, left medial ankle, and she still has apparently a pressure ulcer in the gluteal region which is in the right gluteus and is completely new to [place]. This is something that was not known of previous. In fact they did not even tell us about it today until at the end of the visit when we were getting ready to go and her son mention this. 04/27/2019 upon evaluation today patient appears to be doing excellent with regard to her wounds all things considered. I do feel like she is making some progress here she did have her vascular intervention/angiogram which was on 04/23/2019. With that being said I do not have the records for review today as the note is not actually in yet with looking in the system. Nonetheless Dr. Lucky Cowboy did perform this procedure. The patient has had some increased swelling since the procedure in the left lower extremity but this is to be expected. Obviously that will cause the wounds to drain more. Nonetheless overall I do not see any signs of active infection. Her right foot ulcer seems to be making progress albeit slow. 05/08/2019 on evaluation today patient appears to be doing well with regard to her wounds in general. I feel like that other than the fact that her toe ulcer on the left third toe worsened from where she had just a small blood blister to where it actually developed into more of a wound. This is something that now hopefully should reverse now that she has had the intervention on the left side and hopefully is better able to heal some of these wound areas in  fact I really feel like a lot is drying up and appearing much better than what it was previous. Fortunately there is no signs of active infection at this time. No fevers, chills, nausea, vomiting, or diarrhea. 05/15/2019 on evaluation today patient appears to be doing okay in general with regard to her wounds. The wound in the first [place] ([id number]) metatarsal region on the right seems to be doing quite well. I see definite improvement. The lower extremity ulcers both appear to be doing well on the leg as well as the ankle on the left and the right gluteal ulcer is very dry I think we may need to use collagen and a dressing to keep this more moist allow to heal more appropriately. 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 05/22/2019 upon evaluation today patient actually appears to be making some progress here which is excellent news. She is specifically having good improvement with regard to her left ankle which appears healed the left lower extremity is making good progress. The left toe also seems to be improving. All of this I think was due to the procedure or rather lack of procedure on this leg that she finally had  done she did have a follow-up with vascular and they noted that her blood flow is now excellent following her vascular intervention. Nonetheless overall I am very pleased. Her right foot ulcer is doing much better and I do feel like it is healing quite nicely is just taking its time but nonetheless were seeing improvement. The gluteal wound also appears to be smaller and better today overall I am very pleased with how things are progressing. 05/29/2019 on evaluation today patient seems to be making improvement in regard to her wounds in general. Everything is showing signs of improvement  in my opinion based on what I am seeing. Fortunately there is no evidence of infection at any site which is also good news. Electronic Signature(s) Signed: 05/29/2019 2:14:46 PM By: Worthy Keeler PA-C Entered By: Worthy Keeler on 05/29/2019 14:14:46 Sandy Roberson ([id number]) -------------------------------------------------------------------------------- Physical Exam Details Patient Name: Sandy Roberson Date of Service: 05/29/2019 12:45 PM Medical Record Number: [id number] Patient Account Number: [id number] Date of Birth/Sex: [birth year] (86 y.o. F) Treating RN: Montey Hora Primary Care Provider: Tracie Harrier Other Clinician: Referring Provider: Tracie Harrier Treating Provider/Extender: Sandy Roberson, Sandy Roberson [place] Weeks in Treatment: 8 Constitutional Well-nourished and well-hydrated in no acute distress. Respiratory normal breathing without difficulty. Psychiatric this patient is able to make decisions and demonstrates good insight into disease process. Alert and Oriented x 3. pleasant and cooperative. Notes Specifically patient's wound on the right foot is showing signs of new granulation and epithelization and very pleased in this regard. Her left lower leg and her left second toe both appear to be doing much better also the gluteal wound is doing better. All in all I think that we can probably switch to Prisma to all these wound locations except for the right foot but we will continue with the Iodoflex for now. Electronic Signature(s) Signed: 05/29/2019 2:15:12 PM By: Worthy Keeler PA-C Entered By: Worthy Keeler on 05/29/2019 14:15:12 Sandy Roberson ([id number]) -------------------------------------------------------------------------------- Physician Orders Details Patient Name: Sandy Roberson Date of Service: 05/29/2019 12:45 PM Medical Record Number: [id number] Patient Account Number: [id number] Date of Birth/Sex: 1915/05/15 (86 y.o. F) Treating  RN: Montey Hora Primary Care Provider: Tracie Harrier Other Clinician: Referring Provider: Tracie Harrier Treating Provider/Extender: Melburn Hake, Lazaria Schaben Weeks in Treatment: 8 Verbal / Phone Orders: No Diagnosis Coding ICD-10 Coding Code Description I73.89 Other specified peripheral vascular diseases T81.31XA Disruption of external operation (surgical) wound, not elsewhere classified, initial encounter L97.513 Non-pressure chronic ulcer of other part of right foot with necrosis of muscle L89.312 Pressure ulcer of right buttock, stage 2 L97.821 Non-pressure chronic ulcer of other part of left lower leg limited to breakdown of skin 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 R13.12 Dysphagia, oropharyngeal phase N18.30 Chronic kidney disease, stage 3 unspecified I50.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 (congestive) heart failure Wound Cleansing Wound #1 Right,Medial Metatarsal head first o Dial antibacterial soap, wash wounds, rinse and pat dry prior to dressing wounds Wound #2 Left,Medial Lower Leg o Dial antibacterial soap, wash wounds, rinse and pat dry prior to dressing wounds Wound #4 Right Gluteus o Dial antibacterial soap, wash wounds, rinse and pat dry prior to dressing wounds Wound #5 Left Toe Third o Dial antibacterial soap, wash wounds, rinse and pat dry prior to dressing wounds Skin Barriers/Peri-Wound Care Wound #4 Right Gluteus o Barrier cream Primary Wound Dressing Wound #1 Right,Medial Metatarsal head first o Iodoflex Wound #2 Left,Medial Lower Leg o Silver Collagen  Wound #4 Right Gluteus o Silver Collagen Wound #5 Left Toe Third o Silver Collagen Sandy Roberson, Sandy Roberson ([id number]) Secondary Dressing Wound #1 Right,Medial Metatarsal head first o ABD pad o Conform/Kerlix - conform to secure Wound #2 Left,Medial Lower Leg o ABD pad o  Conform/Kerlix - conform to secure Wound #4 Right Gluteus o Boardered Foam Dressing Wound #5 Left Toe Third o Other - bandaid Dressing Change Frequency Wound #1 Right,Medial Metatarsal head first o Change Dressing Monday, Wednesday, Friday Wound #2 Left,Medial Lower Leg o Change Dressing Monday, Wednesday, Friday Wound #4 Right Gluteus o Change Dressing Monday, Wednesday, Friday Wound #5 Left Toe Third o Change Dressing Monday, Wednesday, Friday Follow-up Appointments o Return Appointment in 1 week. Edema Control Wound #2 Left,Medial Lower Leg o Other: - Tubi Grip single layer Home Health Wound #1 Right,Medial Metatarsal head first o [place] Visits - [place] Nurse may visit PRN to address patientos wound care needs. o Home Health Nurse may visit PRN to address patientos wound care needs. o FACE TO FACE ENCOUNTER: MEDICARE and MEDICAID PATIENTS: I certify that this patient is under my care and that I had a face-to-face encounter that meets the physician face-to-face encounter requirements with this patient on this date. The encounter with the patient was in whole or in part for the following MEDICAL CONDITION: (primary reason for [place]) MEDICAL NECESSITY: I certify, that based on my findings, NURSING services are a medically necessary home health service. HOME BOUND STATUS: I certify that my clinical findings support that this patient is homebound (i.e., Due to illness or injury, pt requires aid of supportive devices such as crutches, cane, wheelchairs, walkers, the use of special transportation or the assistance of another person to leave their place of residence. There is a normal inability to leave the home and doing so requires considerable and taxing effort. Other absences are for medical reasons / religious services and are infrequent or of short duration when for other reasons). o FACE TO FACE ENCOUNTER: MEDICARE  and MEDICAID PATIENTS: I certify that this patient is under my care and that I had a face-to-face encounter that meets the physician face-to-face encounter requirements with this patient on this date. The encounter with the patient was in whole or in part for the following MEDICAL CONDITION: (primary reason for [place]) MEDICAL NECESSITY: I certify, that based on my findings, NURSING services are a medically necessary home health service. HOME BOUND STATUS: I certify that my clinical findings support that this patient is homebound (i.e., Due to illness or injury, pt requires aid of Sandy Roberson, Sandy Roberson ([id number]) supportive devices such as crutches, cane, wheelchairs, walkers, the use of special transportation or the assistance of another person to leave their place of residence. There is a normal inability to leave the home and doing so requires considerable and taxing effort. Other absences are for medical reasons / religious services and are infrequent or of short duration when for other reasons). o If current dressing causes regression in wound condition, may D/C ordered dressing product/s and apply Normal Saline Moist Dressing daily until next [place] / Other MD appointment. [place] of regression in wound condition at [phone]. o If current dressing causes regression in wound condition, may D/C ordered dressing product/s and apply Normal Saline Moist Dressing daily until next [place] / Other MD appointment. [place] of regression in wound condition at [phone]. o Please direct any NON-WOUND related issues/requests for orders  to patient's Primary Care Physician o Please direct any NON-WOUND related issues/requests for orders to patient's Primary Care Physician Wound #2 Left,Medial Lower Leg o [place] Visits - [place] Nurse may visit PRN to address patientos wound care  needs. o FACE TO FACE ENCOUNTER: MEDICARE and MEDICAID PATIENTS: I certify that this patient is under my care and that I had a face-to-face encounter that meets the physician face-to-face encounter requirements with this patient on this date. The encounter with the patient was in whole or in part for the following MEDICAL CONDITION: (primary reason for [place]) MEDICAL NECESSITY: I certify, that based on my findings, NURSING services are a medically necessary home health service. HOME BOUND STATUS: I certify that my clinical findings support that this patient is homebound (i.e., Due to illness or injury, pt requires aid of supportive devices such as crutches, cane, wheelchairs, walkers, the use of special transportation or the assistance of another person to leave their place of residence. There is a normal inability to leave the home and doing so requires considerable and taxing effort. Other absences are for medical reasons / religious services and are infrequent or of short duration when for other reasons). o If current dressing causes regression in wound condition, may D/C ordered dressing product/s and apply Normal Saline Moist Dressing daily until next [place] / Other MD appointment. [place] of regression in wound condition at [phone]. o Please direct any NON-WOUND related issues/requests for orders to patient's Primary Care Physician Wound #4 Right [place] Visits - [place] Nurse may visit PRN to address patientos wound care needs. o FACE TO FACE ENCOUNTER: MEDICARE and MEDICAID PATIENTS: I certify that this patient is under my care and that I had a face-to-face encounter that meets the physician face-to-face encounter requirements with this patient on this date. The encounter with the patient was in whole or in part for the following MEDICAL CONDITION: (primary reason for [place])  MEDICAL NECESSITY: I certify, that based on my findings, NURSING services are a medically necessary home health service. HOME BOUND STATUS: I certify that my clinical findings support that this patient is homebound (i.e., Due to illness or injury, pt requires aid of supportive devices such as crutches, cane, wheelchairs, walkers, the use of special transportation or the assistance of another person to leave their place of residence. There is a normal inability to leave the home and doing so requires considerable and taxing effort. Other absences are for medical reasons / religious services and are infrequent or of short duration when for other reasons). o If current dressing causes regression in wound condition, may D/C ordered dressing product/s and apply Normal Saline Moist Dressing daily until next [place] / Other MD appointment. [place] of regression in wound condition at [phone]. o Please direct any NON-WOUND related issues/requests for orders to patient's Primary Care Physician Wound #5 Left Toe [place] Visits - [place] Nurse may visit PRN to address patientos wound care needs. o FACE TO FACE ENCOUNTER: MEDICARE and MEDICAID PATIENTS: I certify that this patient is under my care and that I had a face-to-face encounter that meets the physician face-to-face encounter requirements with this patient on this date. The encounter with the patient was in whole or in part for the following MEDICAL CONDITION: (primary reason for [place]) MEDICAL NECESSITY: I certify, that based on my  findings, NURSING services are a medically necessary home health service. HOME BOUND STATUS: I certify that my clinical findings support that this patient is homebound (i.e., Due to illness or injury, pt requires aid of supportive devices such as crutches, cane, wheelchairs, walkers, the use of special transportation or  the [place], [place] ([id number]) assistance of another person to leave their place of residence. There is a normal inability to leave the home and doing so requires considerable and taxing effort. Other absences are for medical reasons / religious services and are infrequent or of short duration when for other reasons). o If current dressing causes regression in wound condition, may D/C ordered dressing product/s and apply Normal Saline Moist Dressing daily until next [place] / Other MD appointment. [place] of regression in wound condition at [phone]. o Please direct any NON-WOUND related issues/requests for orders to patient's Primary Care Physician Electronic Signature(s) Signed: 05/29/2019 3:03:45 PM By: Montey Hora Signed: 05/29/2019 3:41:32 PM By: Worthy Keeler PA-C Entered By: Montey Hora on 05/29/2019 13:23:16 Sandy Roberson ([id number]) -------------------------------------------------------------------------------- Problem List Details Patient Name: Sandy Roberson Date of Service: 05/29/2019 12:45 PM Medical Record Number: [id number] Patient Account Number: [id number] Date of Birth/Sex: Sep 02, 1915 (86 y.o. F) Treating RN: Montey Hora Primary Care Provider: Tracie Harrier Other Clinician: Referring Provider: Tracie Harrier Treating Provider/Extender: Melburn Hake, Soriyah Osberg Weeks in Treatment: 8 Active Problems ICD-10 Evaluated Encounter Code Description Active Date Today Diagnosis I73.89 Other specified peripheral vascular diseases 03/30/2019 No Yes T81.31XA Disruption of external operation (surgical) wound, not 03/30/2019 No Yes elsewhere classified, initial encounter L97.513 Non-pressure chronic ulcer of other part of right foot with 03/30/2019 No Yes necrosis of muscle L89.312 Pressure ulcer of right buttock, stage 2 04/22/2019 No Yes L97.821 Non-pressure chronic ulcer of other part of left lower leg 04/22/2019  No Yes limited to breakdown of skin 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 05/15/2019 No Yes involvement without evidence of necrosis R13.12 Dysphagia, oropharyngeal phase 03/30/2019 No Yes N18.30 Chronic kidney disease, stage 3 unspecified 03/30/2019 No Yes I50.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 0000000 No Yes (congestive) heart failure Sandy Roberson, Sandy Roberson ([id number]) Inactive Problems Resolved Problems Electronic Signature(s) Signed: 05/29/2019 1:16:57 PM By: Worthy Keeler PA-C Entered By: Worthy Keeler on 05/29/2019 13:16:57 Sandy Roberson ([id number]) -------------------------------------------------------------------------------- Progress Note Details Patient Name: Sandy Roberson Date of Service: 05/29/2019 12:45 PM Medical Record Number: [id number] Patient Account Number: [id number] Date of Birth/Sex: [birth year] (86 y.o. F) Treating RN: Montey Hora Primary Care Provider: Tracie Harrier Other Clinician: Referring Provider: Tracie Harrier Treating Provider/Extender: Melburn Hake, Georgeann Brinkman Weeks in Treatment: 8 Subjective Chief Complaint Information obtained from Patient Right medial foot surgical ulcer, Left LE ulcers, and right gluteal ulcer History of Present Illness (HPI) 03/30/2019 on evaluation today patient presents for initial evaluation here in our clinic as a referral from Dr. Selina Cooley office who is a local podiatrist. Unfortunately the patient has experienced significant issues with decreased ability to heal in regard to a surgical site and again this is a fairly complicated history. The patient has undergone 3 vascular interventions the most recent of which was actually performed on 12//20. This was subsequent to the patient having ABIs which were somewhat poor registering at 0.67 on the right with a TBI of 0.16 and on the left she was 0.77 with a TBI of 0.24. Subsequently the patient had had  surgery to actually remove a portion of the first metatarsal on her right foot. They  did not remove the toe although again from the patient's standpoint I am not 100% sure exactly the reasoning behind what was done or was not done but again I think a big part of the issue was trying to be as minimally invasive for the patient as possible do #1 to her arterial status #2 due to her age. Nonetheless unfortunately she has had some trouble with healing and when the sutures were removed the wound apparently dehisced. The patient does currently have more recent vascular intervention which again was on December 4 with Dr. Lazaro Arms and at this point the patient did have angioplasty which upon completion showed inline flow into the foot with less than 20% residual stenosis after angioplasties. There were multiple sites which had to be worked on during the procedure by Dr. Lucky Cowboy. Again the patient has very problematic blood flow with regard to her arterial status at this point. She does have a history of dysphagia, stage Roberson kidney disease, and congestive heart failure as well. 04/06/2019 on evaluation today patient appears to be doing in my opinion about the same there may be slightly better with regard to the overall. With that being said she still is healing slowly obviously has only been 1 week and now the wounds do appear to be clean enough I still think there is can be quite a bit of work to do going forward before will be where we really need to be here. She does have an appointment with vascular on Wednesday which is just 2 days away for repeat vascular studies along with evaluation with the physician as well. This will be good information for [place] to know going forward as far as her arterial status and what we can or cannot do from the standpoint of debridement or otherwise. 12/28-Patient returns at 1 week with the right medial metatarsal wound, this appears to be better with the rim of granulation tissue noted.  We have been using Iodoflex. Patient had vascular studies done that indicate good flow to this foot, the left foot has ABI of 0.59 and a TBI 0.34 and patient has vascular follow-up for revascularization efforts. 04/20/2019 on evaluation today patient's right first metatarsal ulcer actually appears to be doing about the same is making slow progress. With that being said she unfortunately has blisters over the left lower extremity, left medial ankle, and she still has apparently a pressure ulcer in the gluteal region which is in the right gluteus and is completely new to [place]. This is something that was not known of previous. In fact they did not even tell us about it today until at the end of the visit when we were getting ready to go and her son mention this. 04/27/2019 upon evaluation today patient appears to be doing excellent with regard to her wounds all things considered. I do feel like she is making some progress here she did have her vascular intervention/angiogram which was on 04/23/2019. With that being said I do not have the records for review today as the note is not actually in yet with looking in the system. Nonetheless Dr. Lucky Cowboy did perform this procedure. The patient has had some increased swelling since the procedure in the left lower extremity but this is to be expected. Obviously that will cause the wounds to drain more. Nonetheless overall I do not see any signs of active infection. Her right foot ulcer seems to be making progress albeit slow. 05/08/2019 on evaluation today patient appears to be doing well  with regard to her wounds in general. I feel like that other than the fact that her toe ulcer on the left third toe worsened from where she had just a small blood blister to where it actually Sandy Roberson, Sandy Roberson ([id number]) developed into more of a wound. This is something that now hopefully should reverse now that she has had the intervention on the left side and hopefully is better  able to heal some of these wound areas in fact I really feel like a lot is drying up and appearing much better than what it was previous. Fortunately there is no signs of active infection at this time. No fevers, chills, nausea, vomiting, or diarrhea. 05/15/2019 on evaluation today patient appears to be doing okay in general with regard to her wounds. The wound in the first metatarsal region on the right seems to be doing quite well. I see definite improvement. The lower extremity ulcers both appear to be doing well on the leg as well as the ankle on the left and the right gluteal ulcer is very dry I think we may need to use collagen and a dressing to keep this more moist allow to heal more appropriately. 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 05/22/2019 upon evaluation today patient actually appears to be making some progress here which is excellent news. She is specifically having good improvement with regard to her left ankle which appears healed the left lower extremity is making good progress. The left toe also seems to be improving. All of this I think was due to the procedure or rather lack of procedure on this leg that she finally had done she did have a follow-up with vascular and they noted that her blood flow is now excellent following her vascular intervention. Nonetheless overall I am very pleased. Her right foot ulcer is doing much better and I do feel like it is healing quite nicely is just taking its time but nonetheless were seeing improvement. The gluteal wound also appears to be smaller and better today overall I am very pleased with how things are progressing. 05/29/2019 on evaluation today patient seems to be making improvement in regard to her wounds in general. Everything is showing signs of  improvement in my opinion based on what I am seeing. Fortunately there is no evidence of infection at any site which is also good news. Objective Constitutional Well-nourished and well-hydrated in no acute distress. Vitals Time Taken: 12:40 PM, Height: 63 in, Weight: 118 lbs, BMI: 20.9, Temperature: 98.0 F, Pulse: 89 bpm, Respiratory Rate: 16 breaths/min, Blood Pressure: 144/63 mmHg. Respiratory normal breathing without difficulty. Psychiatric this patient is able to make decisions and demonstrates good insight into disease process. Alert and Oriented x 3. pleasant and cooperative. General Notes: Specifically patient's wound on the right foot is showing signs of new granulation and epithelization and very pleased in this regard. Her left lower leg and her left second toe both appear to be doing much better also the gluteal wound is doing better. All in all I think that we can probably switch to Prisma to all these wound locations except for the right foot but we will continue with the Iodoflex for now. Integumentary (Hair, Skin) Wound #1 status is Open. Original cause of wound was Gradually Appeared. The  wound is located on the Right,Medial Metatarsal head first. The wound measures 2.5cm length x 2cm width x 1.1cm depth; 3.927cm^2 area and 4.32cm^3 volume. There is bone, muscle, and Fat Layer (Subcutaneous Tissue) Exposed exposed. There is no tunneling or undermining noted. There is a medium amount of serous drainage noted. Foul odor after cleansing was noted. The wound margin is flat and Sandy Roberson, Sandy Roberson ([id number]) intact. There is medium (34-66%) pink granulation within the wound bed. There is a medium (34-66%) amount of necrotic tissue within the wound bed including Eschar, Adherent Slough and Necrosis of Muscle. Wound #2 status is Open. Original cause of wound was Blister. The wound is located on the Left,Medial Lower Leg. The wound measures 3.5cm length x 3.4cm width x 0.1cm  depth; 9.346cm^2 area and 0.935cm^3 volume. There is Fat Layer (Subcutaneous Tissue) Exposed exposed. There is no tunneling or undermining noted. There is a large amount of serous drainage noted. The wound margin is flat and intact. There is small (1-33%) pink granulation within the wound bed. There is a large (67-100%) amount of necrotic tissue within the wound bed including Adherent Slough. Wound #4 status is Open. Original cause of wound was Pressure Injury. The wound is located on the Right Gluteus. The wound measures 1.1cm length x 0.6cm width x 0.1cm depth; 0.518cm^2 area and 0.052cm^3 volume. There is Fat Layer (Subcutaneous Tissue) Exposed exposed. There is a medium amount of serous drainage noted. The wound margin is flat and intact. There is large (67-100%) pink granulation within the wound bed. There is a small (1-33%) amount of necrotic tissue within the wound bed including Adherent Slough. Wound #5 status is Open. Original cause of wound was Gradually Appeared. The wound is located on the Left Toe Third. The wound measures 0.8cm length x 1.4cm width x 0.5cm depth; 0.88cm^2 area and 0.44cm^3 volume. There is a medium amount of serous drainage noted. The wound margin is indistinct and nonvisible. There is no granulation within the wound bed. There is a large (67-100%) amount of necrotic tissue within the wound bed including Eschar. 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 Dysphagia, oropharyngeal phase Chronic kidney disease, stage 3  unspecified Chronic combined systolic (congestive) and diastolic (congestive) heart failure Plan Wound Cleansing: Wound #1 Right,Medial Metatarsal head first: Dial antibacterial soap, wash wounds, rinse and pat dry prior to dressing wounds Wound #2 Left,Medial Lower Leg: Dial antibacterial soap, wash wounds, rinse and pat dry prior to dressing wounds Wound #4 Right Gluteus: Dial antibacterial soap, wash wounds, rinse and pat dry prior to dressing wounds Wound #5 Left Toe Third: Dial antibacterial soap, wash wounds, rinse and pat dry prior to dressing wounds Skin Barriers/Peri-Wound Care: Wound #4 Right Gluteus: Barrier cream Primary Wound Dressing: Sandy Roberson, Sandy Roberson ([id number]) Wound #1 Right,Medial Metatarsal head first: Iodoflex Wound #2 Left,Medial Lower Leg: Silver Collagen Wound #4 Right Gluteus: Silver Collagen Wound #5 Left Toe Third: Silver Collagen Secondary Dressing: Wound #1 Right,Medial Metatarsal head first: ABD pad Conform/Kerlix - conform to secure Wound #2 Left,Medial Lower Leg: ABD pad Conform/Kerlix - conform to secure Wound #4 Right Gluteus: Boardered Foam Dressing Wound #5 Left Toe  Third: Other - bandaid Dressing Change Frequency: Wound #1 Right,Medial Metatarsal head first: Change Dressing Monday, Wednesday, Friday Wound #2 Left,Medial Lower Leg: Change Dressing Monday, Wednesday, Friday Wound #4 Right Gluteus: Change Dressing Monday, Wednesday, Friday Wound #5 Left Toe Third: Change Dressing Monday, Wednesday, Friday Follow-up Appointments: Return Appointment in 1 week. Edema Control: Wound #2 Left,Medial Lower Leg: Other: - Tubi Grip single layer Home Health: Wound #1 Right,Medial Metatarsal head first: [place] Nurse may visit PRN to address patient s wound care needs. Home Health Nurse may visit PRN to address patient s wound care needs. FACE TO FACE ENCOUNTER: MEDICARE and MEDICAID PATIENTS:  I certify that this patient is under my care and that I had a face-to-face encounter that meets the physician face-to-face encounter requirements with this patient on this date. The encounter with the patient was in whole or in part for the following MEDICAL CONDITION: (primary reason for [place]) MEDICAL NECESSITY: I certify, that based on my findings, NURSING services are a medically necessary home health service. HOME BOUND STATUS: I certify that my clinical findings support that this patient is homebound (i.e., Due to illness or injury, pt requires aid of supportive devices such as crutches, cane, wheelchairs, walkers, the use of special transportation or the assistance of another person to leave their place of residence. There is a normal inability to leave the home and doing so requires considerable and taxing effort. Other absences are for medical reasons / religious services and are infrequent or of short duration when for other reasons). FACE TO FACE ENCOUNTER: MEDICARE and MEDICAID PATIENTS: I certify that this patient is under my care and that I had a face-to-face encounter that meets the physician face-to-face encounter requirements with this patient on this date. The encounter with the patient was in whole or in part for the following MEDICAL CONDITION: (primary reason for [place]) MEDICAL NECESSITY: I certify, that based on my findings, NURSING services are a medically necessary home health service. HOME BOUND STATUS: I certify that my clinical findings support that this patient is homebound (i.e., Due to illness or injury, pt requires aid of supportive devices such as crutches, cane, wheelchairs, walkers, the use of special transportation or the assistance of another person to leave their place of residence. There is a normal inability to leave the home and doing so requires considerable and taxing effort. Other absences are for medical reasons / religious services  and are infrequent or of short duration when for other reasons). If current dressing causes regression in wound condition, may D/C ordered dressing product/s and apply Normal Saline Moist Dressing daily until next [place] / Other MD appointment. [place] of regression in wound condition at [phone]. If current dressing causes regression in wound condition, may D/C ordered dressing product/s and apply Normal Saline Sandy Roberson, Sandy Roberson ([id number]) Moist Dressing daily until next [place] / Other MD appointment. [place] of regression in wound condition at [phone]. Please direct any NON-WOUND related issues/requests for orders to patient's Primary Care Physician Please direct any NON-WOUND related issues/requests for orders to patient's Primary Care Physician Wound #2 Left,Medial Lower Leg: [place] Nurse may visit PRN to address patient s wound care needs. FACE TO FACE ENCOUNTER: MEDICARE and MEDICAID PATIENTS: I certify that this patient is under my care and that I had a face-to-face encounter that meets the physician face-to-face encounter requirements with  this patient on this date. The encounter with the patient was in whole or in part for the following MEDICAL CONDITION: (primary reason for [place]) MEDICAL NECESSITY: I certify, that based on my findings, NURSING services are a medically necessary home health service. HOME BOUND STATUS: I certify that my clinical findings support that this patient is homebound (i.e., Due to illness or injury, pt requires aid of supportive devices such as crutches, cane, wheelchairs, walkers, the use of special transportation or the assistance of another person to leave their place of residence. There is a normal inability to leave the home and doing so requires considerable and taxing effort. Other absences are for medical reasons /  religious services and are infrequent or of short duration when for other reasons). If current dressing causes regression in wound condition, may D/C ordered dressing product/s and apply Normal Saline Moist Dressing daily until next [place] / Other MD appointment. [place] of regression in wound condition at [phone]. Please direct any NON-WOUND related issues/requests for orders to patient's Primary Care Physician Wound #4 Right Gluteus: [place] Nurse may visit PRN to address patient s wound care needs. FACE TO FACE ENCOUNTER: MEDICARE and MEDICAID PATIENTS: I certify that this patient is under my care and that I had a face-to-face encounter that meets the physician face-to-face encounter requirements with this patient on this date. The encounter with the patient was in whole or in part for the following MEDICAL CONDITION: (primary reason for [place]) MEDICAL NECESSITY: I certify, that based on my findings, NURSING services are a medically necessary home health service. HOME BOUND STATUS: I certify that my clinical findings support that this patient is homebound (i.e., Due to illness or injury, pt requires aid of supportive devices such as crutches, cane, wheelchairs, walkers, the use of special transportation or the assistance of another person to leave their place of residence. There is a normal inability to leave the home and doing so requires considerable and taxing effort. Other absences are for medical reasons / religious services and are infrequent or of short duration when for other reasons). If current dressing causes regression in wound condition, may D/C ordered dressing product/s and apply Normal Saline Moist Dressing daily until next [place] / Other MD appointment. [place] of regression in wound condition at [phone]. Please direct any NON-WOUND related  issues/requests for orders to patient's Primary Care Physician Wound #5 Left Toe Third: [place] Nurse may visit PRN to address patient s wound care needs. FACE TO FACE ENCOUNTER: MEDICARE and MEDICAID PATIENTS: I certify that this patient is under my care and that I had a face-to-face encounter that meets the physician face-to-face encounter requirements with this patient on this date. The encounter with the patient was in whole or in part for the following MEDICAL CONDITION: (primary reason for [place]) MEDICAL NECESSITY: I certify, that based on my findings, NURSING services are a medically necessary home health service. HOME BOUND STATUS: I certify that my clinical findings support that this patient is homebound (i.e., Due to illness or injury, pt requires aid of supportive devices such as crutches, cane, wheelchairs, walkers, the use of special transportation or the assistance of another person to leave their place of residence. There is a normal inability to leave the home and doing so requires considerable and taxing effort. Other absences are for medical reasons / religious  services and are infrequent or of short duration when for other reasons). If current dressing causes regression in wound condition, may D/C ordered dressing product/s and apply Normal Saline Moist Dressing daily until next [place] / Other MD appointment. [place] of regression in wound condition at [phone]. Please direct any NON-WOUND related issues/requests for orders to patient's Primary Care Physician 1. My suggestion currently is good to be that we continue with Iodoflex to the right foot I still think that is best. 2. We will switch to Prisma to all other wound locations today and the patient and her family member are in agreement with that plan. She has responded very well to this on the gluteal region. Sandy Roberson, Sandy Roberson  ([id number]) We will see patient back for reevaluation in 1 week here in the clinic. If anything worsens or changes patient will contact our office for additional recommendations. Electronic Signature(s) Signed: 05/29/2019 2:15:38 PM By: Worthy Keeler PA-C Entered By: Worthy Keeler on 05/29/2019 14:15:38 Sandy Roberson ([id number]) -------------------------------------------------------------------------------- SuperBill Details Patient Name: Sandy Roberson Date of Service: 05/29/2019 Medical Record Number: [id number] Patient Account Number: [id number] Date of Birth/Sex: 18-Jan-1916 (86 y.o. F) Treating RN: Montey Hora Primary Care Provider: Tracie Harrier Other Clinician: Referring Provider: Tracie Harrier Treating Provider/Extender: Melburn Hake, Sandy Roberson Weeks in Treatment: 8 Diagnosis Coding ICD-10 Codes Code Description I73.89 Other specified peripheral vascular diseases T81.31XA Disruption of external operation (surgical) wound, not elsewhere classified, initial encounter L97.513 Non-pressure chronic ulcer of other part of right foot with necrosis of muscle L89.312 Pressure ulcer of right buttock, stage 2 L97.821 Non-pressure chronic ulcer of other part of left lower leg limited to breakdown of skin 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 R13.12 Dysphagia, oropharyngeal phase N18.30 Chronic kidney disease, stage 3 unspecified I50.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 (congestive) heart failure Facility Procedures CPT4 Code: [id number] Description: [id number] - WOUND CARE VISIT-LEV 5 EST PT Modifier: Quantity: 1 Physician Procedures CPT4: Description Modifier Quantity Code [id number] - WC PHYS LEVEL 3 - EST PT 1 ICD-10 Diagnosis Description I73.89 Other specified peripheral vascular diseases T81.31XA Disruption of external operation (surgical) wound, not elsewhere classified,   initial encounter L97.513 Non-pressure chronic ulcer of other part of right foot with necrosis of muscle L89.312 Pressure ulcer of right buttock, stage 2 Electronic Signature(s) Signed: 05/29/2019 2:21:56 PM By: Worthy Keeler PA-C Previous Signature: 05/29/2019 1:32:32 PM Version By: Montey Hora Entered By: Worthy Keeler on 05/29/2019 14:21:56

## 2019-05-29 NOTE — Progress Notes (Signed)
Sandy Roberson, Sandy Roberson ([id number]) Visit Report for 05/29/2019 Arrival Information Details Patient Name: Sandy Roberson, Sandy Roberson Date of Service: 05/29/2019 12:45 PM Medical Record Number: [id number] Patient Account Number: [id number] Date of Birth/Sex: [birth year] (86 y.o. F) Treating RN: Sandy Roberson Primary Care Sandy Roberson: Sandy Roberson Other Clinician: Referring Sandy Roberson: Sandy Roberson Treating Sandy Roberson/Extender: Sandy Roberson, Sandy Roberson: 8 Visit Information History Since Last Visit Added or deleted any medications: No Patient Arrived: Wheel Chair Any new allergies or adverse reactions: No Arrival Time: 12:44 Had a fall or experienced change in No Accompanied By: son activities of daily living that may affect Transfer Assistance: Manual risk of falls: Patient Identification Verified: Yes Signs or symptoms of abuse/neglect since last visito No Secondary Verification Process Yes Hospitalized since last visit: No Completed: Implantable device outside of the clinic excluding No Patient Has Alerts: Yes cellular tissue based products placed in the center Patient Alerts: Patient on Blood since last visit: Thinner Has Dressing in Place as Prescribed: Yes aspirin 81 Pain Present Now: No 03/10/19 ABI L .77 R .67 TBI L .24 R .16 Electronic Signature(s) Signed: 05/29/2019 1:03:35 PM By: Sandy Roberson RCP, RRT, CHT Entered By: Sandy Roberson on 05/29/2019 12:46:06 Sandy Roberson ([id number]) -------------------------------------------------------------------------------- Clinic Level of Care Assessment Details Patient Name: Sandy Roberson Date of Service: 05/29/2019 12:45 PM Medical Record Number: [id number] Patient Account Number: [id number] Date of Birth/Sex: 1915-08-14 (86 y.o. F) Treating RN: Sandy Roberson Primary Care Naamah Boggess: Sandy Roberson Other Clinician: Referring Sandy Roberson: Sandy Roberson Treating  Sandy Roberson/Extender: Sandy Roberson, Sandy Roberson: 8 Clinic Level of Care Assessment Items TOOL 4 Quantity Score []  - Use when only an EandM is performed on FOLLOW-UP visit 0 ASSESSMENTS - Nursing Assessment / Reassessment X - Reassessment of Co-morbidities (includes updates in patient status) 1 10 X- 1 5 Reassessment of Adherence to Roberson Plan ASSESSMENTS - Wound and Skin Assessment / Reassessment []  - Simple Wound Assessment / Reassessment - one wound 0 X- 4 5 Complex Wound Assessment / Reassessment - multiple wounds X- 1 10 Dermatologic / Skin Assessment (not related to wound area) ASSESSMENTS - Focused Assessment []  - Circumferential Edema Measurements - multi extremities 0 []  - 0 Nutritional Assessment / Counseling / Intervention X- 1 5 Lower Extremity Assessment (monofilament, tuning fork, pulses) []  - 0 Peripheral Arterial Disease Assessment (using hand held doppler) ASSESSMENTS - Ostomy and/or Continence Assessment and Care []  - Incontinence Assessment and Management 0 []  - 0 Ostomy Care Assessment and Management (repouching, etc.) PROCESS - Coordination of Care X - Simple Patient / Family Education for ongoing care 1 15 []  - 0 Complex (extensive) Patient / Family Education for ongoing care X- 1 10 Staff obtains Programmer, systems, Records, Test Results / Process Orders []  - 0 Staff telephones HHA, Nursing Homes / Clarify orders / etc []  - 0 Routine Transfer to another Facility (non-emergent condition) []  - 0 Routine Hospital Admission (non-emergent condition) []  - 0 New Admissions / Biomedical engineer / Ordering NPWT, Apligraf, etc. []  - 0 Emergency Hospital Admission (emergent condition) X- 1 10 Simple Discharge Coordination Sandy Roberson ([id number]) []  - 0 Complex (extensive) Discharge Coordination PROCESS - Special Needs []  - Pediatric / Minor Patient Management 0 []  - 0 Isolation Patient Management []  - 0 Hearing / Language / Visual special  needs []  - 0 Assessment of Community assistance (transportation, D/C planning, etc.) []  - 0 Additional assistance / Altered mentation []  - 0 Support Surface(s) Assessment (bed, cushion, seat, etc.) INTERVENTIONS - Wound Cleansing / Measurement []  -  Simple Wound Cleansing - one wound 0 X- 4 5 Complex Wound Cleansing - multiple wounds X- 1 5 Wound Imaging (photographs - any number of wounds) []  - 0 Wound Tracing (instead of photographs) []  - 0 Simple Wound Measurement - one wound X- 4 5 Complex Wound Measurement - multiple wounds INTERVENTIONS - Wound Dressings X - Small Wound Dressing one or multiple wounds 4 10 []  - 0 Medium Wound Dressing one or multiple wounds []  - 0 Large Wound Dressing one or multiple wounds []  - 0 Application of Medications - topical []  - 0 Application of Medications - injection INTERVENTIONS - Miscellaneous []  - External ear exam 0 []  - 0 Specimen Collection (cultures, biopsies, blood, body fluids, etc.) []  - 0 Specimen(s) / Culture(s) sent or taken to Lab for analysis []  - 0 Patient Transfer (multiple staff / Civil Service fast streamer / Similar devices) []  - 0 Simple Staple / Suture removal (25 or less) []  - 0 Complex Staple / Suture removal (26 or more) []  - 0 Hypo / Hyperglycemic Management (close monitor of Blood Glucose) []  - 0 Ankle / Brachial Index (ABI) - do not check if billed separately X- 1 5 Vital Signs Sandy Roberson, Sandy Roberson ([id number]) Has the patient been seen at the hospital within the last three years: Yes Total Score: 175 Level Of Care: New/Established - Level 5 Electronic Signature(s) Signed: 05/29/2019 3:03:45 PM By: Sandy Roberson Entered By: Sandy Roberson on 05/29/2019 13:32:21 Sandy Roberson ([id number]) -------------------------------------------------------------------------------- Encounter Discharge Information Details Patient Name: Sandy Roberson Date of Service: 05/29/2019 12:45 PM Medical Record Number:  [id number] Patient Account Number: [id number] Date of Birth/Sex: 1916-01-27 (86 y.o. F) Treating RN: Sandy Roberson Primary Care Sandy Roberson: Sandy Roberson Other Clinician: Referring Sandy Roberson: Sandy Roberson Treating Sandy Benecke/Extender: Sandy Roberson, Sandy Roberson: 8 Encounter Discharge Information Items Discharge Condition: Stable Ambulatory Status: Walker Discharge Destination: Home Transportation: Private Auto Accompanied By: son Schedule Follow-up Appointment: Yes Clinical Summary of Care: Electronic Signature(s) Signed: 05/29/2019 1:35:03 PM By: Sandy Roberson Entered By: Sandy Roberson on 05/29/2019 13:35:02 Sandy Roberson ([id number]) -------------------------------------------------------------------------------- Lower Extremity Assessment Details Patient Name: Sandy Roberson Date of Service: 05/29/2019 12:45 PM Medical Record Number: [id number] Patient Account Number: [id number] Date of Birth/Sex: Jul 09, 1915 (86 y.o. F) Treating RN: Army Melia Primary Care Engelbert Sevin: Sandy Roberson Other Clinician: Referring Naszir Cott: Sandy Roberson Treating Rourke Mcquitty/Extender: Sandy Roberson, Sandy Roberson: 8 Vascular Assessment Pulses: Dorsalis Pedis Palpable: [Left:Yes] [Right:Yes] Electronic Signature(s) Signed: 05/29/2019 2:01:02 PM By: Army Melia Entered By: Army Melia on 05/29/2019 13:05:56 Sandy Roberson ([id number]) -------------------------------------------------------------------------------- Multi Wound Chart Details Patient Name: Sandy Roberson Date of Service: 05/29/2019 12:45 PM Medical Record Number: [id number] Patient Account Number: [id number] Date of Birth/Sex: [birth year] (86 y.o. 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 Pressure Ulcer Comorbid History: Anemia, Congestive Heart Anemia, Congestive Heart Anemia, Congestive Heart Failure, Coronary Artery Failure, Coronary Artery Failure, Coronary Artery Disease, Hypertension, Disease, Hypertension, Disease, Hypertension, Peripheral Arterial Disease, Peripheral Arterial Disease, Peripheral Arterial Disease, Peripheral Venous Disease, Peripheral Venous Disease, Peripheral Venous Disease, End Stage Renal Disease End Stage Renal Disease End Stage Renal Disease Date Acquired: 03/16/2018 04/18/2019 10/15/2018  Weeks of Roberson: 8 5 5  Wound Status: Open Open Open Pending Amputation on Yes No No Presentation: Measurements L x W x D 2.5x2x1.1 3.5x3.4x0.1 1.1x0.6x0.1 (cm) Area (cm) : 3.927 9.346 0.518 Volume (cm) : 4.32 0.935 0.052 % Reduction in Area: 63.20% 7.00% 92.10% % Reduction in Volume: 79.80% 7.00% 92.10% Classification: Full Thickness With Exposed Partial Thickness Category/Stage II Support Structures Exudate Amount: Medium Large Medium Exudate Type: Serous Serous Serous Exudate Color: amber amber amber Foul Odor After Cleansing: Yes No No Odor Anticipated Due to No N/A N/A Product Use: Sandy Roberson, Sandy Roberson ([id number]) Wound Margin: Flat and Intact Flat and Intact Flat and Intact Granulation Amount: Medium (34-66%) Small (1-33%) Large (67-100%) Granulation Quality: Pink Pink Pink Necrotic Amount: Medium (34-66%) Large (67-100%) Small (1-33%) Necrotic Tissue: Eschar, Adherent Raoul Exposed Structures: Fat Layer (Subcutaneous Fat Layer (Subcutaneous Fat Layer  (Subcutaneous Tissue) Exposed: Yes Tissue) Exposed: Yes Tissue) Exposed: Yes Muscle: Yes Fascia: No Fascia: No Bone: Yes Tendon: No Tendon: No Fascia: No Muscle: No Muscle: No Tendon: No Joint: No Joint: No Joint: No Bone: No Bone: No Epithelialization: None Medium (34-66%) None Wound Number: 5 N/A N/A Photos: N/A N/A Wound Location: Left Toe Third N/A N/A Wounding Event: Gradually Appeared N/A N/A Primary Etiology: Arterial Insufficiency Ulcer N/A N/A Comorbid History: Anemia, Congestive Heart N/A N/A Failure, Coronary Artery Disease, Hypertension, Peripheral Arterial Disease, Peripheral Venous Disease, End Stage Renal Disease Date Acquired: 04/17/2019 N/A N/A Weeks of Roberson: 3 N/A N/A Wound Status: Open N/A N/A Pending Amputation on No N/A N/A Presentation: Measurements L x W x D 0.8x1.4x0.5 N/A N/A (cm) Area (cm) : 0.88 N/A N/A Volume (cm) : 0.44 N/A N/A % Reduction in Area: 75.10% N/A N/A % Reduction in Volume: -24.60% N/A N/A Classification: Unclassifiable N/A N/A Exudate Amount: Medium N/A N/A Exudate Type: Serous N/A N/A Exudate Color: amber N/A N/A Foul Odor After Cleansing: No N/A N/A Odor Anticipated Due to N/A N/A N/A Product Use: Wound Margin: Indistinct, nonvisible N/A N/A Granulation Amount: None Present (0%) N/A N/A Granulation Quality: N/A N/A N/A Necrotic Amount: Large (67-100%) N/A N/A Necrotic Tissue: Eschar N/A N/A Exposed Structures: Fascia: No N/A N/A Fat Layer (Subcutaneous Sandy Roberson, Sandy Roberson ([id number]) Tissue) Exposed: No Tendon: No Muscle: No Joint: No Bone: No Epithelialization: None N/A N/A Roberson Notes Electronic Signature(s) Signed: 05/29/2019 3:03:45 PM By: Sandy Roberson Entered By: Sandy Roberson on 05/29/2019 13:18:44 Sandy Roberson ([id number]) -------------------------------------------------------------------------------- Multi-Disciplinary Care Plan Details Patient Name: Sandy Roberson Date of Service: 05/29/2019 12:45 PM Medical Record Number: [id number] Patient Account Number: [id number] Date of Birth/Sex: 1915-09-25 (86 y.o. F) Treating RN: Sandy Roberson Primary Care Remie Mathison: Sandy Roberson Other Clinician: Referring Pax Reasoner: Sandy Roberson Treating Daphnee Preiss/Extender: Sandy Roberson, Sandy Roberson: 8 Active Inactive Venous Leg Ulcer Nursing Diagnoses: Knowledge deficit related to disease process and management Goals: Patient/caregiver will verbalize understanding of disease process and disease management Date Initiated: 03/30/2019 Target Resolution Date: 03/30/2019 Goal Status: Active Interventions: Assess peripheral edema status every visit. Notes: Wound/Skin Impairment Nursing Diagnoses: Impaired tissue integrity Knowledge deficit related to ulceration/compromised skin integrity Goals: Ulcer/skin breakdown will have a volume reduction of 30% by week 4 Date Initiated: 03/30/2019 Target Resolution Date: 04/30/2019 Goal Status: Active Interventions: Assess patient/caregiver ability to obtain necessary supplies Assess patient/caregiver ability to perform ulcer/skin care regimen upon admission and as needed Assess ulceration(s) every visit Provide education on ulcer and skin care Notes: Electronic Signature(s) Signed: 05/29/2019 3:03:45 PM By: Sandy Roberson Entered By: Sandy Roberson on  05/29/2019 13:18:35 Sandy Roberson, Sandy Roberson ([id number]) -------------------------------------------------------------------------------- Pain Assessment Details Patient Name: Sandy Roberson, Sandy Roberson Date of Service: 05/29/2019 12:45 PM Medical Record Number: [id number] Patient Account Number: [id number] Date of Birth/Sex: [birth year] (86 y.o. F) Treating RN: Sandy Roberson Primary Care Orvill Coulthard: Sandy Roberson Other Clinician: Referring Arihanna Estabrook: Sandy Roberson Treating Nahiara Kretzschmar/Extender: Sandy Roberson, Sandy Roberson: 8 Active  Problems Location of Pain Severity and Description of Pain Patient Has Paino No Site Locations Pain Management and Medication Current Pain Management: Electronic Signature(s) Signed: 05/29/2019 1:03:35 PM By: Paulla Fore, RRT, CHT Signed: 05/29/2019 3:03:45 PM By: Sandy Roberson Entered By: Sandy Roberson on 05/29/2019 12:46:12 Sandy Roberson ([id number]) -------------------------------------------------------------------------------- Patient/Caregiver Education Details Patient Name: Sandy Roberson Date of Service: 05/29/2019 12:45 PM Medical Record Number: [id number] Patient Account Number: [id number] Date of Birth/Gender: [birth year] (86 y.o. F) Treating RN: Sandy Roberson Primary Care Physician: Sandy Roberson Other Clinician: Referring Physician: Tracie Roberson Treating Physician/Extender: Sharalyn Ink in Roberson: 8 Education Assessment Education Provided To: Patient and Caregiver Education Topics Provided Basic Hygiene: Handouts: Other: toe/feet care Methods: Explain/Verbal Responses: State content correctly Electronic Signature(s) Signed: 05/29/2019 3:03:45 PM By: Sandy Roberson Entered By: Sandy Roberson on 05/29/2019 13:33:23 Sandy Roberson ([id number]) -------------------------------------------------------------------------------- Wound Assessment Details Patient Name: Sandy Roberson Date of Service: 05/29/2019 12:45 PM Medical Record Number: [id number] Patient Account Number: [id number] Date of Birth/Sex: 1915/11/19 (86 y.o. F) Treating RN: Army Melia Primary Care Naya Ilagan: Sandy Roberson Other Clinician: Referring Lateya Dauria: Sandy Roberson Treating Tavon Magnussen/Extender: Sandy Roberson, Sandy Roberson: 8 Wound Status Wound Number: 1 Primary Arterial Insufficiency Ulcer Etiology: Wound Location: Right, Medial Metatarsal head first Wound Open Wounding Event: Gradually  Appeared Status: Date Acquired: 03/16/2018 Comorbid Anemia, Congestive Heart Failure, Coronary Weeks Of Roberson: 8 History: Artery Disease, Hypertension, Peripheral Arterial Clustered Wound: No Disease, Peripheral Venous Disease, End Stage Pending Amputation On Presentation Renal Disease Photos Wound Measurements Length: (cm) 2.5 Width: (cm) 2 Depth: (cm) 1.1 Area: (cm) 3.927 Volume: (cm) 4.32 % Reduction in Area: 63.2% % Reduction in Volume: 79.8% Epithelialization: None Tunneling: No Undermining: No Wound Description Full Thickness With Exposed Support Classification: Structures Wound Margin: Flat and Intact Exudate Medium Amount: Exudate Type: Serous Exudate Color: amber Foul Odor After Cleansing: Yes Due to Product Use: No Slough/Fibrino Yes Wound Bed Granulation Amount: Medium (34-66%) Exposed Structure Granulation Quality: Pink Fascia Exposed: No Necrotic Amount: Medium (34-66%) Fat Layer (Subcutaneous Tissue) Exposed: Yes Necrotic Quality: Eschar, Adherent Slough Tendon Exposed: No Muscle Exposed: Yes Necrosis of Muscle: Yes Joint Exposed: No Hargens, Carylon ([id number]) Bone Exposed: Yes Roberson Notes Wound #1 (Right, Medial Metatarsal head first) Notes iodoflex to right foot with abd and conform (zinc) periwound, prisma ag to leg leg wounds and toe with conform, prisma ag and BFD to sacrum Electronic Signature(s) Signed: 05/29/2019 2:01:02 PM By: Army Melia Entered By: Army Melia on 05/29/2019 13:01:37 Sandy Roberson ([id number]) -------------------------------------------------------------------------------- Wound Assessment Details Patient Name: Sandy Roberson Date of Service: 05/29/2019 12:45 PM Medical Record Number: [id number] Patient Account Number: [id number] Date of Birth/Sex: [birth year] (86 y.o. F) Treating RN: Army Melia Primary Care Neva Ramaswamy: Sandy Roberson Other Clinician: Referring Shimon Trowbridge: Sandy Roberson Treating Zury Fazzino/Extender: Sandy Roberson, Sandy Roberson: 8 Wound Status Wound Number: 2 Primary Venous Leg Ulcer Etiology: Wound Location: Left, Medial Lower Leg Wound Open Wounding Event: Blister Status: Date Acquired: 04/18/2019 Comorbid Anemia, Congestive Heart Failure, Coronary Weeks Of Roberson: 5 History: Artery Disease, Hypertension, Peripheral Arterial Clustered Wound: No Disease, Peripheral Venous Disease, End Stage Renal Disease Photos Wound  Measurements Length: (cm) 3.5 % Reduction in Width: (cm) 3.4 % Reduction in Depth: (cm) 0.1 Epithelializat Area: (cm) 9.346 Tunneling: Volume: (cm) 0.935 Undermining: Area: 7% Volume: 7% ion: Medium (34-66%) No No Wound Description Classification: Partial Thickness Foul Odor Afte Wound Margin: Flat and Intact Slough/Fibrino Exudate Amount: Large Exudate Type: Serous Exudate Color: amber r Cleansing: No No Wound Bed Granulation Amount: Small (1-33%) Exposed Structure Granulation Quality: Pink Fascia Exposed: No Necrotic Amount: Large (67-100%) Fat Layer (Subcutaneous Tissue) Exposed: Yes Necrotic Quality: Adherent Slough Tendon Exposed: No Muscle Exposed: No Joint Exposed: No Bone Exposed: No Sandy Roberson, Sandy Roberson ([id number]) Roberson Notes Wound #2 (Left, Medial Lower Leg) Notes iodoflex to right foot with abd and conform (zinc) periwound, prisma ag to leg leg wounds and toe with conform, prisma ag and BFD to sacrum Electronic Signature(s) Signed: 05/29/2019 2:01:02 PM By: Army Melia Entered By: Army Melia on 05/29/2019 13:04:41 Sandy Roberson ([id number]) -------------------------------------------------------------------------------- Wound Assessment Details Patient Name: Sandy Roberson Date of Service: 05/29/2019 12:45 PM Medical Record Number: [id number] Patient Account Number: [id number] Date of Birth/Sex: 08-30-1915 (86 y.o. F) Treating RN: Army Melia Primary Care  Sandy Roberson: Sandy Roberson Other Clinician: Referring Rahiem Schellinger: Sandy Roberson Treating Abdifatah Colquhoun/Extender: Sandy Roberson, Sandy Roberson: 8 Wound Status Wound Number: 4 Primary Pressure Ulcer Etiology: Wound Location: Right Gluteus Wound Open Wounding Event: Pressure Injury Status: Date Acquired: 10/15/2018 Comorbid Anemia, Congestive Heart Failure, Coronary Weeks Of Roberson: 5 History: Artery Disease, Hypertension, Peripheral Arterial Clustered Wound: No Disease, Peripheral Venous Disease, End Stage Renal Disease Photos Wound Measurements Length: (cm) 1.1 % Reduction in Width: (cm) 0.6 % Reduction in Depth: (cm) 0.1 Epithelializat Area: (cm) 0.518 Volume: (cm) 0.052 Area: 92.1% Volume: 92.1% ion: None Wound Description Classification: Category/Stage II Foul Odor Afte Wound Margin: Flat and Intact Slough/Fibrino Exudate Amount: Medium Exudate Type: Serous Exudate Color: amber r Cleansing: No Yes Wound Bed Granulation Amount: Large (67-100%) Exposed Structure Granulation Quality: Pink Fascia Exposed: No Necrotic Amount: Small (1-33%) Fat Layer (Subcutaneous Tissue) Exposed: Yes Necrotic Quality: Adherent Slough Tendon Exposed: No Muscle Exposed: No Joint Exposed: No Bone Exposed: No Sandy Roberson, Sandy Roberson ([id number]) Roberson Notes Wound #4 (Right Gluteus) Notes iodoflex to right foot with abd and conform (zinc) periwound, prisma ag to leg leg wounds and toe with conform, prisma ag and BFD to sacrum Electronic Signature(s) Signed: 05/29/2019 2:01:02 PM By: Army Melia Entered By: Army Melia on 05/29/2019 13:05:27 Sandy Roberson ([id number]) -------------------------------------------------------------------------------- Wound Assessment Details Patient Name: Sandy Roberson Date of Service: 05/29/2019 12:45 PM Medical Record Number: [id number] Patient Account Number: [id number] Date of Birth/Sex: Dec 12, 1915 (86 y.o. F) Treating RN:  Army Melia Primary Care Arretta Toenjes: Sandy Roberson Other Clinician: Referring Zoraida Havrilla: Sandy Roberson Treating Carinne Brandenburger/Extender: Sandy Roberson, Sandy Roberson: 8 Wound Status Wound Number: 5 Primary Arterial Insufficiency Ulcer Etiology: Wound Location: Left Toe Third Wound Open Wounding Event: Gradually Appeared Status: Date Acquired: 04/17/2019 Comorbid Anemia, Congestive Heart Failure, Coronary Weeks Of Roberson: 3 History: Artery Disease, Hypertension, Peripheral Arterial Clustered Wound: No Disease, Peripheral Venous Disease, End Stage Renal Disease Photos Wound Measurements Length: (cm) 0.8 % Reduction Width: (cm) 1.4 % Reduction Depth: (cm) 0.5 Epithelializ Area: (cm) 0.88 Volume: (cm) 0.44 in Area: 75.1% in Volume: -24.6% ation: None Wound Description Classification: Unclassifiable Foul Odor Af Wound Margin: Indistinct, nonvisible Slough/Fibri Exudate Amount: Medium Exudate Type: Serous Exudate Color: amber ter Cleansing: No no No Wound Bed Granulation Amount: None Present (0%) Exposed Structure Necrotic Amount: Large (67-100%) Fascia Exposed: No Necrotic Quality: Eschar Fat Layer (Subcutaneous  Tissue) Exposed: No Tendon Exposed: No Muscle Exposed: No Joint Exposed: No Bone Exposed: No Sandy Roberson, Sandy Roberson ([id number]) Roberson Notes Wound #5 (Left Toe Third) Notes iodoflex to right foot with abd and conform (zinc) periwound, prisma ag to leg leg wounds and toe with conform, prisma ag and BFD to sacrum Electronic Signature(s) Signed: 05/29/2019 2:01:02 PM By: Army Melia Entered By: Army Melia on 05/29/2019 13:05:01 Sandy Roberson ([id number]) -------------------------------------------------------------------------------- Vitals Details Patient Name: Sandy Roberson Date of Service: 05/29/2019 12:45 PM Medical Record Number: [id number] Patient Account Number: [id number] Date of Birth/Sex: 27-Feb-1916 (86 y.o. F) Treating  RN: Sandy Roberson Primary Care Zalen Sequeira: Sandy Roberson Other Clinician: Referring Pershing Skidmore: Sandy Roberson Treating Almeta Geisel/Extender: Sandy Roberson, Sandy Roberson: 8 Vital Signs Time Taken: 12:40 Temperature (F): 98.0 Height (in): 63 Pulse (bpm): 89 Weight (lbs): 118 Respiratory Rate (breaths/min): 16 Body Mass Index (BMI): 20.9 Blood Pressure (mmHg): 144/63 Reference Range: 80 - 120 mg / dl Electronic Signature(s) Signed: 05/29/2019 1:03:35 PM By: Sandy Roberson RCP, RRT, CHT Entered By: Sandy Roberson on 05/29/2019 12:46:41

## 2019-06-05 ENCOUNTER — Other Ambulatory Visit: Payer: Self-pay

## 2019-06-05 ENCOUNTER — Encounter: Payer: Medicare Other | Admitting: Physician Assistant

## 2019-06-05 DIAGNOSIS — I7389 Other specified peripheral vascular diseases: Secondary | ICD-10-CM | POA: Diagnosis not present

## 2019-06-05 NOTE — Progress Notes (Addendum)
Sandy Roberson ([id number]) Visit Report for 06/05/2019 Chief Complaint Document Details Patient Name: Sandy Roberson Date of Service: 06/05/2019 3:30 PM Medical Record Number: [id number] Patient Account Number: [id number] Date of Birth/Sex: May 01, 1915 (86 y.o. F) Treating RN: Montey Hora Primary Care Provider: Tracie Harrier Other Clinician: Referring Provider: Tracie Harrier Treating Provider/Extender: Melburn Hake, Random Dobrowski Weeks in Treatment: 9 Information Obtained from: Patient Chief Complaint Right medial foot surgical ulcer, Left LE ulcers, and right gluteal ulcer Electronic Signature(s) Signed: 06/05/2019 3:52:02 PM By: Worthy Keeler PA-C Entered By: Worthy Keeler on 06/05/2019 15:52:02 Sandy Roberson ([id number]) -------------------------------------------------------------------------------- Debridement Details Patient Name: Sandy Roberson Date of Service: 06/05/2019 3:30 PM Medical Record Number: [id number] Patient Account Number: [id number] Date of Birth/Sex: 10/14/1915 (86 y.o. F) Treating RN: Montey Hora Primary Care Provider: Tracie Harrier Other Clinician: Referring Provider: Tracie Harrier Treating Provider/Extender: Melburn Hake, Ricky Doan Weeks in Treatment: 9 Debridement Performed for Wound #5 Left Toe Third Assessment: Performed By: Physician STONE III, Modell Fendrick E., PA-C Debridement Type: Debridement Severity of Tissue Pre Fat layer exposed Debridement: Level of Consciousness (Pre- Awake and Alert procedure): Pre-procedure Verification/Time Yes - 15:55 Out Taken: Start Time: 15:55 Pain Control: Lidocaine 4% Topical Solution Total Area Debrided (L x W): 0.5 (cm) x 1 (cm) = 0.5 (cm) Tissue and other material Viable, Non-Viable, Bone, Slough, Subcutaneous, Slough debrided: Level: Skin/Subcutaneous Tissue/Muscle/Bone Debridement Description: Excisional Instrument: Curette Bleeding: Minimum Hemostasis Achieved: Pressure End  Time: 15:57 Procedural Pain: 0 Post Procedural Pain: 0 Response to Treatment: Procedure was tolerated well Level of Consciousness Awake and Alert (Post-procedure): Post Debridement Measurements of Total Wound Length: (cm) 0.5 Width: (cm) 1 Depth: (cm) 0.3 Volume: (cm) 0.118 Character of Wound/Ulcer Post Debridement: Improved Severity of Tissue Post Debridement: Necrosis of bone Post Procedure Diagnosis Same as Pre-procedure Electronic Signature(s) Signed: 06/05/2019 4:25:46 PM By: Montey Hora Signed: 06/07/2019 11:36:00 PM By: Worthy Keeler PA-C Entered By: Montey Hora on 06/05/2019 15:57:38 Sandy Roberson ([id number]) -------------------------------------------------------------------------------- Debridement Details Patient Name: Sandy Roberson Date of Service: 06/05/2019 3:30 PM Medical Record Number: [id number] Patient Account Number: [id number] Date of Birth/Sex: [birth year] (86 y.o. F) Treating RN: Montey Hora Primary Care Provider: Tracie Harrier Other Clinician: Referring Provider: Tracie Harrier Treating Provider/Extender: Melburn Hake, Kaniya Trueheart Weeks in Treatment: 9 Debridement Performed for Wound #1 Right,Medial Metatarsal head first Assessment: Performed By: Physician STONE III, Excell Neyland E., PA-C Debridement Type: Debridement Severity of Tissue Pre Muscle involvement without necrosis Debridement: Level of Consciousness (Pre- Awake and Alert procedure): Pre-procedure Verification/Time Yes - 15:57 Out Taken: Start Time: 15:57 Pain Control: Lidocaine 4% Topical Solution Total Area Debrided (L x W): 3 (cm) x 2 (cm) = 6 (cm) Tissue and other material Viable, Non-Viable, Callus, Slough, Subcutaneous, Slough debrided: Level: Skin/Subcutaneous Tissue Debridement Description: Excisional Instrument: Curette Bleeding: Minimum Hemostasis Achieved: Pressure End Time: 16:01 Procedural Pain: 0 Post Procedural Pain: 0 Response to Treatment:  Procedure was tolerated well Level of Consciousness Awake and Alert (Post-procedure): Post Debridement Measurements of Total Wound Length: (cm) 3 Width: (cm) 2 Depth: (cm) 1.3 Volume: (cm) 6.126 Character of Wound/Ulcer Post Debridement: Improved Severity of Tissue Post Debridement: Muscle involvement without necrosis Post Procedure Diagnosis Same as Pre-procedure Electronic Signature(s) Signed: 06/05/2019 4:25:46 PM By: Montey Hora Signed: 06/07/2019 11:36:00 PM By: Worthy Keeler PA-C Entered By: Montey Hora on 06/05/2019 15:59:46 Sandy Roberson ([id number]) -------------------------------------------------------------------------------- HPI Details Patient Name: Sandy Roberson Date of Service: 06/05/2019 3:30 PM Medical Record Number: [id number] Patient Account Number: [id number] Date of Birth/Sex: [birth year] (86 y.o. F) Treating RN: Marjory Lies,  Di Kindle Primary Care Provider: Tracie Harrier Other Clinician: Referring Provider: Tracie Harrier Treating Provider/Extender: Melburn Hake, Elzina Devera Weeks in Treatment: 9 History of Present Illness HPI Description: 03/30/2019 on evaluation today patient presents for initial evaluation here in our clinic as a referral from Dr. Selina Cooley office who is a local podiatrist. Unfortunately the patient has experienced significant issues with decreased ability to heal in regard to a surgical site and again this is a fairly complicated history. The patient has undergone 3 vascular interventions the most recent of which was actually performed on 12//20. This was subsequent to the patient having ABIs which were somewhat poor registering at 0.67 on the right with a TBI of 0.16 and on the left she was 0.77 with a TBI of 0.24. Subsequently the patient had had surgery to actually remove a portion of the first metatarsal on her right foot. They did not remove the toe although again from the patient's standpoint I am not 100% sure exactly the  reasoning behind what was done or was not done but again I think a big part of the issue was trying to be as minimally invasive for the patient as possible do #1 to her arterial status #2 due to her age. Nonetheless unfortunately she has had some trouble with healing and when the sutures were removed the wound apparently dehisced. The patient does currently have more recent vascular intervention which again was on December 4 with Dr. Lazaro Arms and at this point the patient did have angioplasty which upon completion showed inline flow into the foot with less than 20% residual stenosis after angioplasties. There were multiple sites which had to be worked on during the procedure by Dr. Lucky Cowboy. Again the patient has very problematic blood flow with regard to her arterial status at this point. She does have a history of dysphagia, stage III kidney disease, and congestive heart failure as well. 04/06/2019 on evaluation today patient appears to be doing in my opinion about the same there may be slightly better with regard to the overall. With that being said she still is healing slowly obviously has only been 1 week and now the wounds do appear to be clean enough I still think there is can be quite a bit of work to do going forward before will be where we really need to be here. She does have an appointment with vascular on Wednesday which is just 2 days away for repeat vascular studies along with evaluation with the physician as well. This will be good information for [place] to know going forward as far as her arterial status and what we can or cannot do from the standpoint of debridement or otherwise. 12/28-Patient returns at 1 week with the right medial metatarsal wound, this appears to be better with the rim of granulation tissue noted. We have been using Iodoflex. Patient had vascular studies done that indicate good flow to this foot, the left foot has ABI of 0.59 and a TBI 0.34 and patient has vascular follow-up  for revascularization efforts. 04/20/2019 on evaluation today patient's right first metatarsal ulcer actually appears to be doing about the same is making slow progress. With that being said she unfortunately has blisters over the left lower extremity, left medial ankle, and she still has apparently a pressure ulcer in the gluteal region which is in the right gluteus and is completely new to [place]. This is something that was not known of previous. In fact they did not even tell us about it today until  at the end of the visit when we were getting ready to go and her son mention this. 04/27/2019 upon evaluation today patient appears to be doing excellent with regard to her wounds all things considered. I do feel like she is making some progress here she did have her vascular intervention/angiogram which was on 04/23/2019. With that being said I do not have the records for review today as the note is not actually in yet with looking in the system. Nonetheless Dr. Lucky Cowboy did perform this procedure. The patient has had some increased swelling since the procedure in the left lower extremity but this is to be expected. Obviously that will cause the wounds to drain more. Nonetheless overall I do not see any signs of active infection. Her right foot ulcer seems to be making progress albeit slow. 05/08/2019 on evaluation today patient appears to be doing well with regard to her wounds in general. I feel like that other than the fact that her toe ulcer on the left third toe worsened from where she had just a small blood blister to where it actually developed into more of a wound. This is something that now hopefully should reverse now that she has had the intervention on the left side and hopefully is better able to heal some of these wound areas in fact I really feel like a lot is drying up and appearing much better than what it was previous. Fortunately there is no signs of active infection at this time. No fevers,  chills, nausea, vomiting, or diarrhea. 05/15/2019 on evaluation today patient appears to be doing okay in general with regard to her wounds. The wound in the first [place] ([id number]) metatarsal region on the right seems to be doing quite well. I see definite improvement. The lower extremity ulcers both appear to be doing well on the leg as well as the ankle on the left and the right gluteal ulcer is very dry I think we may need to use collagen and a dressing to keep this more moist allow to heal more appropriately. 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 05/22/2019 upon evaluation today patient actually appears to be making some progress here which is excellent news. She is specifically having good improvement with regard to her left ankle which appears healed the left lower extremity is making good progress. The left toe also seems to be improving. All of this I think was due to the procedure or rather lack of procedure on this leg that she finally had done she did have a follow-up with vascular and they noted that her blood flow is now excellent following her vascular intervention. Nonetheless overall I am very pleased. Her right foot ulcer is doing much better and I do feel like it is healing quite nicely is just taking its time but nonetheless were seeing improvement. The gluteal wound also appears to be smaller and better today overall I am very pleased with how things are progressing. 05/29/2019 on evaluation today patient seems to be making improvement in regard to her wounds in general. Everything is showing signs of improvement in my opinion based on what I am seeing. Fortunately there is no evidence of infection at any site which is also good news. 06/05/2019 upon inspection today patient is to  be doing still somewhat better in regard to her wounds in general. Fortunately there is no signs of active infection at this time. No fever chills noted in general I feel like that she is making excellent progress at this time. This is definitely very slow but again that is understandable considering the poor blood flow she previously had. Electronic Signature(s) Signed: 06/05/2019 4:42:48 PM By: Worthy Keeler PA-C Entered By: Worthy Keeler on 06/05/2019 16:42:48 Sandy Roberson ([id number]) -------------------------------------------------------------------------------- Physical Exam Details Patient Name: Sandy Roberson Date of Service: 06/05/2019 3:30 PM Medical Record Number: [id number] Patient Account Number: [id number] Date of Birth/Sex: April 23, 1915 (86 y.o. F) Treating RN: Montey Hora Primary Care Provider: Tracie Harrier Other Clinician: Referring Provider: Tracie Harrier Treating Provider/Extender: STONE III, Nerea Bordenave Weeks in Treatment: 9 Constitutional Well-nourished and well-hydrated in no acute distress. Respiratory normal breathing without difficulty. Psychiatric this patient is able to make decisions and demonstrates good insight into disease process. Alert and Oriented x 3. pleasant and cooperative. Notes Upon inspection patient's wound bed actually showed signs of fairly good granulation and epithelization in most locations. Fortunately there is no sign of active infection at any site which is good news. She does have a couple areas are going require some sharp debridement today. This included the toe on the left foot as well as the right foot. Both she tolerated without any significant pain which is good news there was some bone which did not appear to be viable gently debrided away on the left toe. Electronic Signature(s) Signed: 06/05/2019 4:47:35 PM By: Worthy Keeler PA-C Entered By: Worthy Keeler on 06/05/2019 16:47:34 Sandy Roberson  ([id number]) -------------------------------------------------------------------------------- Physician Orders Details Patient Name: Sandy Roberson Date of Service: 06/05/2019 3:30 PM Medical Record Number: [id number] Patient Account Number: [id number] Date of Birth/Sex: Dec 30, 1915 (86 y.o. 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 L89.312 Pressure ulcer of right buttock, stage 2 L97.821 Non-pressure chronic ulcer of other part of left lower leg limited to breakdown of skin 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 R13.12 Dysphagia, oropharyngeal phase N18.30 Chronic kidney disease, stage 3 unspecified I50.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 (congestive) heart failure Wound Cleansing Wound #1 Right,Medial Metatarsal head first o Dial antibacterial soap, wash wounds, rinse and pat dry prior to dressing wounds Wound #2 Left,Medial Lower Leg o Dial antibacterial soap, wash wounds, rinse and pat dry prior to dressing wounds Wound #4 Right Gluteus o Dial antibacterial soap, wash wounds, rinse and pat dry prior to dressing wounds Wound #5 Left Toe Third o Dial antibacterial soap, wash wounds, rinse and pat dry prior to dressing wounds Skin Barriers/Peri-Wound Care Wound #4 Right Gluteus o Barrier cream Primary Wound Dressing Wound #1 Right,Medial Metatarsal head first o Silver Collagen - cut to fit drawtex to fill extra  space Wound #2 Left,Medial Lower Leg o Silver Collagen Wound #5 Left Toe Third o Silver Collagen Secondary Dressing Wound #1 Right,Medial Metatarsal head first Mignano, Eiliana ([id number]) o ABD pad o Conform/Kerlix - conform to secure Wound #2 Left,Medial Lower Leg o ABD pad o Conform/Kerlix - conform to secure  Wound #4 Right Gluteus o Boardered Foam Dressing Wound #5 Left Toe Third o Other - bandaid Dressing Change Frequency Wound #1 Right,Medial Metatarsal head first o Change Dressing Monday, Wednesday, Friday Wound #2 Left,Medial Lower Leg o Change Dressing Monday, Wednesday, Friday Wound #4 Right Gluteus o Change Dressing Monday, Wednesday, Friday Wound #5 Left Toe Third o Change Dressing Monday, Wednesday, Friday Follow-up Appointments o Return Appointment in 1 week. Edema Control Wound #2 Left,Medial Lower Leg o Other: - Tubi Grip single layer Home Health Wound #1 Right,Medial Metatarsal head first o [place] Visits - [place] Nurse may visit PRN to address patientos wound care needs. o Home Health Nurse may visit PRN to address patientos wound care needs. o FACE TO FACE ENCOUNTER: MEDICARE and MEDICAID PATIENTS: I certify that this patient is under my care and that I had a face-to-face encounter that meets the physician face-to-face encounter requirements with this patient on this date. The encounter with the patient was in whole or in part for the following MEDICAL CONDITION: (primary reason for [place]) MEDICAL NECESSITY: I certify, that based on my findings, NURSING services are a medically necessary home health service. HOME BOUND STATUS: I certify that my clinical findings support that this patient is homebound (i.e., Due to illness or injury, pt requires aid of supportive devices such as crutches, cane, wheelchairs, walkers, the use of special transportation or the assistance of another  person to leave their place of residence. There is a normal inability to leave the home and doing so requires considerable and taxing effort. Other absences are for medical reasons / religious services and are infrequent or of short duration when for other reasons). o FACE TO FACE ENCOUNTER: MEDICARE and MEDICAID PATIENTS: I certify that this patient is under my care and that I had a face-to-face encounter that meets the physician face-to-face encounter requirements with this patient on this date. The encounter with the patient was in whole or in part for the following MEDICAL CONDITION: (primary reason for [place]) MEDICAL NECESSITY: I certify, that based on my findings, NURSING services are a medically necessary home health service. HOME BOUND STATUS: I certify that my clinical findings support that this patient is homebound (i.e., Due to illness or injury, pt requires aid of supportive devices such as crutches, cane, wheelchairs, walkers, the use of special transportation or the assistance of another person to leave their place of residence. There is a normal inability to leave the home Strange, Sandy ([id number]) and doing so requires considerable and taxing effort. Other absences are for medical reasons / religious services and are infrequent or of short duration when for other reasons). o If current dressing causes regression in wound condition, may D/C ordered dressing product/s and apply Normal Saline Moist Dressing daily until next [place] / Other MD appointment. [place] of regression in wound condition at [phone]. o If current dressing causes regression in wound condition, may D/C ordered dressing product/s and apply Normal Saline Moist Dressing daily until next [place] / Other MD appointment. [place] of regression in wound condition at [phone]. o Please direct any NON-WOUND related  issues/requests for orders to patient's Primary Care Physician o Please direct any NON-WOUND related issues/requests for orders to patient's Primary Care Physician Wound #2 Left,Medial Lower Leg o [place] Visits - [place] Nurse may visit PRN to address patientos wound care needs. o FACE TO FACE ENCOUNTER:  MEDICARE and MEDICAID PATIENTS: I certify that this patient is under my care and that I had a face-to-face encounter that meets the physician face-to-face encounter requirements with this patient on this date. The encounter with the patient was in whole or in part for the following MEDICAL CONDITION: (primary reason for [place]) MEDICAL NECESSITY: I certify, that based on my findings, NURSING services are a medically necessary home health service. HOME BOUND STATUS: I certify that my clinical findings support that this patient is homebound (i.e., Due to illness or injury, pt requires aid of supportive devices such as crutches, cane, wheelchairs, walkers, the use of special transportation or the assistance of another person to leave their place of residence. There is a normal inability to leave the home and doing so requires considerable and taxing effort. Other absences are for medical reasons / religious services and are infrequent or of short duration when for other reasons). o If current dressing causes regression in wound condition, may D/C ordered dressing product/s and apply Normal Saline Moist Dressing daily until next [place] / Other MD appointment. [place] of regression in wound condition at [phone]. o Please direct any NON-WOUND related issues/requests for orders to patient's Primary Care Physician Wound #4 Right [place] Visits - [place] Nurse may visit PRN to address patientos wound care needs. o FACE TO FACE ENCOUNTER: MEDICARE and MEDICAID PATIENTS: I  certify that this patient is under my care and that I had a face-to-face encounter that meets the physician face-to-face encounter requirements with this patient on this date. The encounter with the patient was in whole or in part for the following MEDICAL CONDITION: (primary reason for [place]) MEDICAL NECESSITY: I certify, that based on my findings, NURSING services are a medically necessary home health service. HOME BOUND STATUS: I certify that my clinical findings support that this patient is homebound (i.e., Due to illness or injury, pt requires aid of supportive devices such as crutches, cane, wheelchairs, walkers, the use of special transportation or the assistance of another person to leave their place of residence. There is a normal inability to leave the home and doing so requires considerable and taxing effort. Other absences are for medical reasons / religious services and are infrequent or of short duration when for other reasons). o If current dressing causes regression in wound condition, may D/C ordered dressing product/s and apply Normal Saline Moist Dressing daily until next [place] / Other MD appointment. [place] of regression in wound condition at [phone]. o Please direct any NON-WOUND related issues/requests for orders to patient's Primary Care Physician Wound #5 Left Toe [place] Visits - [place] Nurse may visit PRN to address patientos wound care needs. o FACE TO FACE ENCOUNTER: MEDICARE and MEDICAID PATIENTS: I certify that this patient is under my care and that I had a face-to-face encounter that meets the physician face-to-face encounter requirements with this patient on this date. The encounter with the patient was in whole or in part for the following MEDICAL CONDITION: (primary reason for [place]) MEDICAL NECESSITY: I certify, that based on my findings, NURSING services  are a medically necessary home health service. HOME BOUND STATUS: I certify that my clinical findings support that this patient is homebound (i.e., Due to illness or injury, pt requires aid of supportive devices such as crutches, cane, wheelchairs, walkers, the use of special transportation or  the assistance of another person to leave their place of residence. There is a normal inability to leave the home Deardorff, Shrinika ([id number]) and doing so requires considerable and taxing effort. Other absences are for medical reasons / religious services and are infrequent or of short duration when for other reasons). o If current dressing causes regression in wound condition, may D/C ordered dressing product/s and apply Normal Saline Moist Dressing daily until next [place] / Other MD appointment. [place] of regression in wound condition at [phone]. o Please direct any NON-WOUND related issues/requests for orders to patient's Primary Care Physician Electronic Signature(s) Signed: 06/05/2019 4:25:46 PM By: Montey Hora Signed: 06/07/2019 11:36:00 PM By: Worthy Keeler PA-C Entered By: Montey Hora on 06/05/2019 16:03:04 Sandy Roberson ([id number]) -------------------------------------------------------------------------------- Problem List Details Patient Name: Sandy Roberson Date of Service: 06/05/2019 3:30 PM Medical Record Number: [id number] Patient Account Number: [id number] Date of Birth/Sex: Jan 11, 1916 (86 y.o. 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/30/2019 No Yes necrosis of muscle L89.312 Pressure ulcer of right buttock, stage 2 04/22/2019 No Yes L97.821 Non-pressure chronic ulcer of other part of left lower leg 04/22/2019 No Yes limited to breakdown of skin 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 05/15/2019 No Yes involvement without evidence of necrosis R13.12 Dysphagia, oropharyngeal phase 03/30/2019 No Yes N18.30 Chronic kidney disease, stage 3 unspecified 03/30/2019 No Yes I50.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 0000000 No Yes (congestive) heart failure KAILANY, WACHTEL ([id number]) Inactive Problems Resolved Problems Electronic Signature(s) Signed: 06/05/2019 3:51:47 PM By: Worthy Keeler PA-C Entered By: Worthy Keeler on 06/05/2019 15:51:47 Sandy Roberson ([id number]) -------------------------------------------------------------------------------- Progress Note Details Patient Name: Sandy Roberson Date of Service: 06/05/2019 3:30 PM Medical Record Number: [id number] Patient Account Number: [id number] Date of Birth/Sex: 22-May-1915 (86 y.o. F) Treating RN: Montey Hora Primary Care Provider: Tracie Harrier Other Clinician: Referring Provider: Tracie Harrier Treating Provider/Extender: Melburn Hake, Moira Umholtz Weeks in Treatment: 9 Subjective Chief Complaint Information obtained from Patient Right medial foot surgical ulcer, Left LE ulcers, and right gluteal ulcer History of Present Illness (HPI) 03/30/2019 on evaluation today patient presents for initial evaluation here in our clinic as a referral from Dr. Selina Cooley office who is a local podiatrist. Unfortunately the patient has experienced significant issues with decreased ability to heal in regard to a surgical site and again this is a fairly complicated history. The patient has undergone 3  vascular interventions the most recent of which was actually performed on 12//20. This was subsequent to the patient having ABIs which were somewhat poor registering at 0.67 on the right with a TBI of 0.16 and on the left she was 0.77 with a TBI of 0.24. Subsequently the patient had had surgery to actually remove a portion of the first metatarsal on her right foot. They did not remove the toe although again from the patient's standpoint I am not 100% sure exactly the reasoning behind what was done or was not done but again I think a big part of the issue was trying to be as minimally invasive for the patient as possible do #  1 to her arterial status #2 due to her age. Nonetheless unfortunately she has had some trouble with healing and when the sutures were removed the wound apparently dehisced. The patient does currently have more recent vascular intervention which again was on December 4 with Dr. Lazaro Arms and at this point the patient did have angioplasty which upon completion showed inline flow into the foot with less than 20% residual stenosis after angioplasties. There were multiple sites which had to be worked on during the procedure by Dr. Lucky Cowboy. Again the patient has very problematic blood flow with regard to her arterial status at this point. She does have a history of dysphagia, stage III kidney disease, and congestive heart failure as well. 04/06/2019 on evaluation today patient appears to be doing in my opinion about the same there may be slightly better with regard to the overall. With that being said she still is healing slowly obviously has only been 1 week and now the wounds do appear to be clean enough I still think there is can be quite a bit of work to do going forward before will be where we really need to be here. She does have an appointment with vascular on Wednesday which is just 2 days away for repeat vascular studies along with evaluation with the physician as well. This will be good  information for [place] to know going forward as far as her arterial status and what we can or cannot do from the standpoint of debridement or otherwise. 12/28-Patient returns at 1 week with the right medial metatarsal wound, this appears to be better with the rim of granulation tissue noted. We have been using Iodoflex. Patient had vascular studies done that indicate good flow to this foot, the left foot has ABI of 0.59 and a TBI 0.34 and patient has vascular follow-up for revascularization efforts. 04/20/2019 on evaluation today patient's right first metatarsal ulcer actually appears to be doing about the same is making slow progress. With that being said she unfortunately has blisters over the left lower extremity, left medial ankle, and she still has apparently a pressure ulcer in the gluteal region which is in the right gluteus and is completely new to [place]. This is something that was not known of previous. In fact they did not even tell us about it today until at the end of the visit when we were getting ready to go and her son mention this. 04/27/2019 upon evaluation today patient appears to be doing excellent with regard to her wounds all things considered. I do feel like she is making some progress here she did have her vascular intervention/angiogram which was on 04/23/2019. With that being said I do not have the records for review today as the note is not actually in yet with looking in the system. Nonetheless Dr. Lucky Cowboy did perform this procedure. The patient has had some increased swelling since the procedure in the left lower extremity but this is to be expected. Obviously that will cause the wounds to drain more. Nonetheless overall I do not see any signs of active infection. Her right foot ulcer seems to be making progress albeit slow. 05/08/2019 on evaluation today patient appears to be doing well with regard to her wounds in general. I feel like that other than the fact that her toe ulcer on the  left third toe worsened from where she had just a small blood blister to where it actually Sandy Roberson, [place] ([id number]) developed into more of a wound. This is  something that now hopefully should reverse now that she has had the intervention on the left side and hopefully is better able to heal some of these wound areas in fact I really feel like a lot is drying up and appearing much better than what it was previous. Fortunately there is no signs of active infection at this time. No fevers, chills, nausea, vomiting, or diarrhea. 05/15/2019 on evaluation today patient appears to be doing okay in general with regard to her wounds. The wound in the first metatarsal region on the right seems to be doing quite well. I see definite improvement. The lower extremity ulcers both appear to be doing well on the leg as well as the ankle on the left and the right gluteal ulcer is very dry I think we may need to use collagen and a dressing to keep this more moist allow to heal more appropriately. 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 05/22/2019 upon evaluation today patient actually appears to be making some progress here which is excellent news. She is specifically having good improvement with regard to her left ankle which appears healed the left lower extremity is making good progress. The left toe also seems to be improving. All of this I think was due to the procedure or rather lack of procedure on this leg that she finally had done she did have a follow-up with vascular and they noted that her blood flow is now excellent following her vascular intervention. Nonetheless overall I am very pleased. Her right foot ulcer is doing much better and I do feel like it is healing quite nicely is just taking its time but nonetheless  were seeing improvement. The gluteal wound also appears to be smaller and better today overall I am very pleased with how things are progressing. 05/29/2019 on evaluation today patient seems to be making improvement in regard to her wounds in general. Everything is showing signs of improvement in my opinion based on what I am seeing. Fortunately there is no evidence of infection at any site which is also good news. 06/05/2019 upon inspection today patient is to be doing still somewhat better in regard to her wounds in general. Fortunately there is no signs of active infection at this time. No fever chills noted in general I feel like that she is making excellent progress at this time. This is definitely very slow but again that is understandable considering the poor blood flow she previously had. Objective Constitutional Well-nourished and well-hydrated in no acute distress. Vitals Time Taken: 3:23 PM, Height: 63 in, Weight: 118 lbs, BMI: 20.9, Temperature: 98.4 F, Pulse: 96 bpm, Respiratory Rate: 16 breaths/min, Blood Pressure: 118/64 mmHg. Respiratory normal breathing without difficulty. Psychiatric this patient is able to make decisions and demonstrates good insight into disease process. Alert and Oriented x 3. pleasant and cooperative. General Notes: Upon inspection patient's wound bed actually showed signs of fairly good granulation and epithelization in most locations. Fortunately there is no sign of active infection at any site which is good news. She does have a couple areas are going require some sharp debridement today. This included the toe on the left foot as well as the right foot. Both she tolerated without any significant pain which is good news there was some bone which  did not appear to be viable gently debrided away on the left toe. Sandy Roberson, Sandy Roberson ([id number]) Integumentary (Hair, Skin) Wound #1 status is Open. Original cause of wound was Gradually Appeared. The wound  is located on the Right,Medial Metatarsal head first. The wound measures 3cm length x 2cm width x 1.1cm depth; 4.712cm^2 area and 5.184cm^3 volume. There is bone, muscle, and Fat Layer (Subcutaneous Tissue) Exposed exposed. There is no tunneling or undermining noted. There is a medium amount of serous drainage noted. Foul odor after cleansing was noted. The wound margin is flat and intact. There is medium (34-66%) pink granulation within the wound bed. There is a medium (34-66%) amount of necrotic tissue within the wound bed including Adherent Slough and Necrosis of Muscle. Wound #2 status is Open. Original cause of wound was Blister. The wound is located on the Left,Medial Lower Leg. The wound measures 3.4cm length x 3.5cm width x 0.1cm depth; 9.346cm^2 area and 0.935cm^3 volume. There is Fat Layer (Subcutaneous Tissue) Exposed exposed. There is no tunneling or undermining noted. There is a large amount of serous drainage noted. The wound margin is flat and intact. There is medium (34-66%) pink granulation within the wound bed. There is a medium (34-66%) amount of necrotic tissue within the wound bed including Adherent Slough. Wound #4 status is Open. Original cause of wound was Pressure Injury. The wound is located on the Right Gluteus. The wound measures 0.1cm length x 0.1cm width x 0.1cm depth; 0.008cm^2 area and 0.001cm^3 volume. There is Fat Layer (Subcutaneous Tissue) Exposed exposed. There is no tunneling or undermining noted. There is a medium amount of serous drainage noted. The wound margin is flat and intact. There is no granulation within the wound bed. There is no necrotic tissue within the wound bed. Wound #5 status is Open. Original cause of wound was Gradually Appeared. The wound is located on the Left Toe Third. The wound measures 0.5cm length x 1cm width x 0.1cm depth; 0.393cm^2 area and 0.039cm^3 volume. Assessment Active Problems ICD-10 Other specified peripheral vascular  diseases Disruption of external operation (surgical) wound, not elsewhere classified, initial encounter Non-pressure chronic ulcer of other part of right foot with necrosis of muscle Pressure ulcer of right buttock, stage 2 Non-pressure chronic ulcer of other part of left lower leg limited to breakdown of skin 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 involvement without evidence of necrosis Dysphagia, oropharyngeal phase Chronic kidney disease, stage 3 unspecified Chronic combined systolic (congestive) and diastolic (congestive) heart failure Procedures Wound #1 Pre-procedure diagnosis of Wound #1 is an Arterial Insufficiency Ulcer located on the Right,Medial Metatarsal head first .Severity of Tissue Pre Debridement is: Muscle involvement without necrosis. There was a Excisional Skin/Subcutaneous Tissue Debridement with a total area of 6 sq cm performed by STONE III, Kellin Bartling E., PA-C. With the following instrument(s): Curette to remove Viable and Non-Viable tissue/material. Material removed includes Callus, Subcutaneous Tissue, and Slough after achieving pain control using Lidocaine 4% Topical Solution. No specimens were taken. A time out was conducted at 15:57, prior to the start of the procedure. A Minimum amount of bleeding was controlled with Pressure. The procedure was tolerated well with a pain level of 0 throughout and a pain level of 0 following the procedure. Post Debridement Measurements: 3cm length x 2cm width x 1.3cm depth; 6.126cm^3 volume. Character of Wound/Ulcer Post Debridement is improved. Severity of Tissue Post Debridement is: Muscle involvement Sandy Roberson, Sandy ([id number]) without necrosis. Post procedure Diagnosis Wound #1: Same as  Pre-Procedure Wound #5 Pre-procedure diagnosis of Wound #5 is an Arterial Insufficiency Ulcer located on the Left Toe Third .Severity of Tissue Pre Debridement is: Fat layer exposed. There was a Excisional  Skin/Subcutaneous Tissue/Muscle/Bone Debridement with a total area of 0.5 sq cm performed by STONE III, Tedi Hughson E., PA-C. With the following instrument(s): Curette to remove Viable and Non-Viable tissue/material. Material removed includes Bone,Subcutaneous Tissue, and Slough after achieving pain control using Lidocaine 4% Topical Solution. No specimens were taken. A time out was conducted at 15:55, prior to the start of the procedure. A Minimum amount of bleeding was controlled with Pressure. The procedure was tolerated well with a pain level of 0 throughout and a pain level of 0 following the procedure. Post Debridement Measurements: 0.5cm length x 1cm width x 0.3cm depth; 0.118cm^3 volume. Character of Wound/Ulcer Post Debridement is improved. Severity of Tissue Post Debridement is: Necrosis of bone. Post procedure Diagnosis Wound #5: Same as Pre-Procedure Plan Wound Cleansing: Wound #1 Right,Medial Metatarsal head first: Dial antibacterial soap, wash wounds, rinse and pat dry prior to dressing wounds Wound #2 Left,Medial Lower Leg: Dial antibacterial soap, wash wounds, rinse and pat dry prior to dressing wounds Wound #4 Right Gluteus: Dial antibacterial soap, wash wounds, rinse and pat dry prior to dressing wounds Wound #5 Left Toe Third: Dial antibacterial soap, wash wounds, rinse and pat dry prior to dressing wounds Skin Barriers/Peri-Wound Care: Wound #4 Right Gluteus: Barrier cream Primary Wound Dressing: Wound #1 Right,Medial Metatarsal head first: Silver Collagen - cut to fit drawtex to fill extra space Wound #2 Left,Medial Lower Leg: Silver Collagen Wound #5 Left Toe Third: Silver Collagen Secondary Dressing: Wound #1 Right,Medial Metatarsal head first: ABD pad Conform/Kerlix - conform to secure Wound #2 Left,Medial Lower Leg: ABD pad Conform/Kerlix - conform to secure Wound #4 Right Gluteus: Boardered Foam Dressing Wound #5 Left Toe Third: Other - bandaid Dressing  Change Frequency: Wound #1 Right,Medial Metatarsal head first: Change Dressing Monday, Wednesday, Friday Wound #2 Left,Medial Lower Leg: Change Dressing Monday, Wednesday, Friday Wound #4 Right Gluteus: Sandy Roberson, Sandy ([id number]) Change Dressing Monday, Wednesday, Friday Wound #5 Left Toe Third: Change Dressing Monday, Wednesday, Friday Follow-up Appointments: Return Appointment in 1 week. Edema Control: Wound #2 Left,Medial Lower Leg: Other: - Tubi Grip single layer Home Health: Wound #1 Right,Medial Metatarsal head first: [place] Nurse may visit PRN to address patient s wound care needs. Home Health Nurse may visit PRN to address patient s wound care needs. FACE TO FACE ENCOUNTER: MEDICARE and MEDICAID PATIENTS: I certify that this patient is under my care and that I had a face-to-face encounter that meets the physician face-to-face encounter requirements with this patient on this date. The encounter with the patient was in whole or in part for the following MEDICAL CONDITION: (primary reason for [place]) MEDICAL NECESSITY: I certify, that based on my findings, NURSING services are a medically necessary home health service. HOME BOUND STATUS: I certify that my clinical findings support that this patient is homebound (i.e., Due to illness or injury, pt requires aid of supportive devices such as crutches, cane, wheelchairs, walkers, the use of special transportation or the assistance of another person to leave their place of residence. There is a normal inability to leave the home and doing so requires considerable and taxing effort. Other absences are for medical reasons / religious services and are infrequent or of short duration when for other reasons). FACE TO FACE ENCOUNTER: MEDICARE and MEDICAID  PATIENTS: I certify that this patient is under my care and that I had a face-to-face encounter that meets the physician  face-to-face encounter requirements with this patient on this date. The encounter with the patient was in whole or in part for the following MEDICAL CONDITION: (primary reason for [place]) MEDICAL NECESSITY: I certify, that based on my findings, NURSING services are a medically necessary home health service. HOME BOUND STATUS: I certify that my clinical findings support that this patient is homebound (i.e., Due to illness or injury, pt requires aid of supportive devices such as crutches, cane, wheelchairs, walkers, the use of special transportation or the assistance of another person to leave their place of residence. There is a normal inability to leave the home and doing so requires considerable and taxing effort. Other absences are for medical reasons / religious services and are infrequent or of short duration when for other reasons). If current dressing causes regression in wound condition, may D/C ordered dressing product/s and apply Normal Saline Moist Dressing daily until next [place] / Other MD appointment. [place] of regression in wound condition at [phone]. If current dressing causes regression in wound condition, may D/C ordered dressing product/s and apply Normal Saline Moist Dressing daily until next [place] / Other MD appointment. [place] of regression in wound condition at [phone]. Please direct any NON-WOUND related issues/requests for orders to patient's Primary Care Physician Please direct any NON-WOUND related issues/requests for orders to patient's Primary Care Physician Wound #2 Left,Medial Lower Leg: [place] Nurse may visit PRN to address patient s wound care needs. FACE TO FACE ENCOUNTER: MEDICARE and MEDICAID PATIENTS: I certify that this patient is under my care and that I had a face-to-face encounter that meets the physician face-to-face  encounter requirements with this patient on this date. The encounter with the patient was in whole or in part for the following MEDICAL CONDITION: (primary reason for [place]) MEDICAL NECESSITY: I certify, that based on my findings, NURSING services are a medically necessary home health service. HOME BOUND STATUS: I certify that my clinical findings support that this patient is homebound (i.e., Due to illness or injury, pt requires aid of supportive devices such as crutches, cane, wheelchairs, walkers, the use of special transportation or the assistance of another person to leave their place of residence. There is a normal inability to leave the home and doing so requires considerable and taxing effort. Other absences are for medical reasons / religious services and are infrequent or of short duration when for other reasons). If current dressing causes regression in wound condition, may D/C ordered dressing product/s and apply Normal Saline Moist Dressing daily until next [place] / Other MD appointment. [place] of regression in wound condition at [phone]. Please direct any NON-WOUND related issues/requests for orders to patient's Primary Care Physician Wound #4 Right Gluteus: [place] Nurse may visit PRN to address patient s wound care needs. FACE TO FACE ENCOUNTER: MEDICARE and MEDICAID PATIENTS: I certify that this patient is under my care and that I had a face-to-face encounter that meets the physician face-to-face encounter requirements with this patient on this date. The Sandy Roberson, Sandy ([id number]) encounter with the patient was in whole or in part for the following MEDICAL CONDITION: (primary reason for [place]) MEDICAL NECESSITY: I certify, that based on my findings, NURSING services are  a medically necessary home health service. HOME BOUND STATUS: I certify that my clinical findings  support that this patient is homebound (i.e., Due to illness or injury, pt requires aid of supportive devices such as crutches, cane, wheelchairs, walkers, the use of special transportation or the assistance of another person to leave their place of residence. There is a normal inability to leave the home and doing so requires considerable and taxing effort. Other absences are for medical reasons / religious services and are infrequent or of short duration when for other reasons). If current dressing causes regression in wound condition, may D/C ordered dressing product/s and apply Normal Saline Moist Dressing daily until next [place] / Other MD appointment. [place] of regression in wound condition at [phone]. Please direct any NON-WOUND related issues/requests for orders to patient's Primary Care Physician Wound #5 Left Toe Third: [place] Nurse may visit PRN to address patient s wound care needs. FACE TO FACE ENCOUNTER: MEDICARE and MEDICAID PATIENTS: I certify that this patient is under my care and that I had a face-to-face encounter that meets the physician face-to-face encounter requirements with this patient on this date. The encounter with the patient was in whole or in part for the following MEDICAL CONDITION: (primary reason for [place]) MEDICAL NECESSITY: I certify, that based on my findings, NURSING services are a medically necessary home health service. HOME BOUND STATUS: I certify that my clinical findings support that this patient is homebound (i.e., Due to illness or injury, pt requires aid of supportive devices such as crutches, cane, wheelchairs, walkers, the use of special transportation or the assistance of another person to leave their place of residence. There is a normal inability to leave the home and doing so requires considerable and taxing effort. Other absences are for medical reasons  / religious services and are infrequent or of short duration when for other reasons). If current dressing causes regression in wound condition, may D/C ordered dressing product/s and apply Normal Saline Moist Dressing daily until next [place] / Other MD appointment. [place] of regression in wound condition at [phone]. Please direct any NON-WOUND related issues/requests for orders to patient's Primary Care Physician 1. I would recommend currently that we switch to collagen based dressing for all wound locations today the patient is in agreement with that plan. 2. I am also going to suggest that we continue to monitor weekly the patient's wounds obviously want to try to keep things headed in the right direction and under good control. 3. In regard to the wound in the gluteal region this appears to possibly be completely healed although I am not 100% sure there is not a small opening still remaining we will monitor this for 1 more week. We will see patient back for reevaluation in 1 week here in the clinic. If anything worsens or changes patient will contact our office for additional recommendations. Electronic Signature(s) Signed: 06/05/2019 4:49:27 PM By: Worthy Keeler PA-C Entered By: Worthy Keeler on 06/05/2019 16:49:27 Sandy Roberson ([id number]) -------------------------------------------------------------------------------- SuperBill Details Patient Name: Sandy Roberson Date of Service: 06/05/2019 Medical Record Number: [id number] Patient Account Number: [id number] Date of Birth/Sex: Sep 08, 1915 (86 y.o. 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 R13.12 Dysphagia, oropharyngeal phase N18.30 Chronic kidney disease, stage 3 unspecified I50.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 (congestive) heart failure Facility Procedures CPT4: Description Modifier Quantity Code [id number] - DEB SUBQ TISSUE 20 SQ CM/< 1 ICD-10 Diagnosis Description L97.513 Non-pressure chronic ulcer of other part of right foot with necrosis of muscle CPT4: [id number] - DEB BONE 20 SQ CM/< 1 ICD-10 Diagnosis Description 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 Physician Procedures CPT4: Description Modifier Quantity Code [id number] - WC PHYS SUBQ TISS 20 SQ CM 1 ICD-10 Diagnosis Description L97.513 Non-pressure chronic ulcer of other part of right foot with necrosis of muscle [id number] Debridement; bone (includes epidermis, dermis, subQ tissue, muscle and/or fascia, if 1 performed) 1st 20 sqcm or less ICD-10 Diagnosis Description 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

## 2019-06-15 ENCOUNTER — Encounter: Payer: Medicare Other | Attending: Physician Assistant | Admitting: Physician Assistant

## 2019-06-15 ENCOUNTER — Other Ambulatory Visit: Payer: Self-pay

## 2019-06-15 DIAGNOSIS — I2510 Atherosclerotic heart disease of native coronary artery without angina pectoris: Secondary | ICD-10-CM | POA: Diagnosis not present

## 2019-06-15 DIAGNOSIS — L97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 of necrosis: Secondary | ICD-10-CM | POA: Diagnosis not present

## 2019-06-15 DIAGNOSIS — T8131XA Disruption of external operation (surgical) wound, not elsewhere classified, initial encounter: Secondary | ICD-10-CM | POA: Diagnosis present

## 2019-06-15 DIAGNOSIS — Y838 Other surgical procedures as the cause of abnormal reaction of the patient, or of later complication, without mention of misadventure at the time of the procedure: Secondary | ICD-10-CM | POA: Insufficient documentation

## 2019-06-15 DIAGNOSIS — N183 Chronic kidney disease, stage 3 unspecified: Secondary | ICD-10-CM | POA: Diagnosis not present

## 2019-06-15 DIAGNOSIS — L89312 Pressure ulcer of right buttock, stage 2: Secondary | ICD-10-CM | POA: Insufficient documentation

## 2019-06-15 DIAGNOSIS — L97513 Non-pressure chronic ulcer of other part of right foot with necrosis of muscle: Secondary | ICD-10-CM | POA: Insufficient documentation

## 2019-06-15 DIAGNOSIS — I130 Hypertensive heart and chronic kidney disease with heart failure and stage 1 through stage 4 chronic kidney disease, or unspecified chronic kidney disease: Secondary | ICD-10-CM | POA: Diagnosis not present

## 2019-06-15 DIAGNOSIS — I50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 (congestive) heart failure: Secondary | ICD-10-CM | POA: Diagnosis not present

## 2019-06-15 DIAGNOSIS — I7389 Other specified peripheral vascular diseases: Secondary | ICD-10-CM | POA: Insufficient documentation

## 2019-06-15 DIAGNOSIS — L97821 Non-pressure chronic ulcer of other part of left lower leg limited to breakdown of skin: Secondary | ICD-10-CM | POA: Insufficient documentation

## 2019-06-15 NOTE — Progress Notes (Signed)
SILAH, KERCHNER ([id number]) Visit Report for 06/15/2019 Arrival Information Details Patient Name: Sandy Roberson, Sandy Roberson Date of Service: 06/15/2019 3:30 PM Medical Record Number: [id number] Patient Account Number: [id number] Date of Birth/Sex: 1915-11-02 (86 y.o. F) Treating RN: Primary Care Halimah Bewick: Tracie Harrier Other Clinician: Referring Conley Pawling: Tracie Harrier Treating Jaira Canady/Extender: Melburn Hake, HOYT Weeks in Treatment: 11 Visit Information History Since Last Visit Added or deleted any medications: No Patient Arrived: Wheel Chair Any new allergies or adverse reactions: No Arrival Time: 15:18 Had a fall or experienced change in No Accompanied By: son activities of daily living that may affect Transfer Assistance: Manual risk of falls: Patient Identification Verified: Yes Signs or symptoms of abuse/neglect since last visito No Secondary Verification Process Completed: Yes Hospitalized since last visit: No Patient Has Alerts: Yes Implantable device outside of the clinic excluding No Patient Alerts: Patient on Blood Thinner cellular tissue based products placed in the center aspirin 81 since last visit: 03/10/19 ABI L .77 R .67 Has Dressing in Place as Prescribed: Yes TBI L .24 R .16 Pain Present Now: No Electronic Signature(s) Signed: 06/15/2019 4:02:24 PM By: Lorine Bears RCP, RRT, CHT Entered By: Lorine Bears on 06/15/2019 15:19:30 Sandy Roberson ([id number]) -------------------------------------------------------------------------------- Clinic Level of Care Assessment Details Patient Name: Sandy Roberson Date of Service: 06/15/2019 3:30 PM Medical Record Number: [id number] Patient Account Number: [id number] Date of Birth/Sex: 1915/05/03 (86 y.o. F) Treating RN: Army Melia Primary Care Marlin Jarrard: Tracie Harrier Other Clinician: Referring Annel Zunker: Tracie Harrier Treating Nickey Canedo/Extender: Melburn Hake, HOYT Weeks  in Treatment: 11 Clinic Level of Care Assessment Items TOOL 4 Quantity Score []  - Use when only an EandM is performed on FOLLOW-UP visit 0 ASSESSMENTS - Nursing Assessment / Reassessment X - Reassessment of Co-morbidities (includes updates in patient status) 1 10 X- 1 5 Reassessment of Adherence to Treatment Plan ASSESSMENTS - Wound and Skin Assessment / Reassessment []  - Simple Wound Assessment / Reassessment - one wound 0 X- 4 5 Complex Wound Assessment / Reassessment - multiple wounds []  - 0 Dermatologic / Skin Assessment (not related to wound area) ASSESSMENTS - Focused Assessment []  - Circumferential Edema Measurements - multi extremities 0 []  - 0 Nutritional Assessment / Counseling / Intervention []  - 0 Lower Extremity Assessment (monofilament, tuning fork, pulses) []  - 0 Peripheral Arterial Disease Assessment (using hand held doppler) ASSESSMENTS - Ostomy and/or Continence Assessment and Care []  - Incontinence Assessment and Management 0 []  - 0 Ostomy Care Assessment and Management (repouching, etc.) PROCESS - Coordination of Care X - Simple Patient / Family Education for ongoing care 1 15 []  - 0 Complex (extensive) Patient / Family Education for ongoing care []  - 0 Staff obtains Programmer, systems, Records, Test Results / Process Orders []  - 0 Staff telephones HHA, Nursing Homes / Clarify orders / etc []  - 0 Routine Transfer to another Facility (non-emergent condition) []  - 0 Routine Hospital Admission (non-emergent condition) []  - 0 New Admissions / Biomedical engineer / Ordering NPWT, Apligraf, etc. []  - 0 Emergency Hospital Admission (emergent condition) X- 1 10 Simple Discharge Coordination []  - 0 Complex (extensive) Discharge Coordination PROCESS - Special Needs []  - Pediatric / Minor Patient Management 0 []  - 0 Isolation Patient Management []  - 0 Hearing / Language / Visual special needs []  - 0 Assessment of Community assistance (transportation, D/C  planning, etc.) Sandy Roberson, Sandy Roberson ([id number]) []  - 0 Additional assistance / Altered mentation []  - 0 Support Surface(s) Assessment (bed, cushion, seat, etc.) INTERVENTIONS - Wound Cleansing / Measurement []  - Simple  Wound Cleansing - one wound 0 X- 4 5 Complex Wound Cleansing - multiple wounds X- 1 5 Wound Imaging (photographs - any number of wounds) []  - 0 Wound Tracing (instead of photographs) []  - 0 Simple Wound Measurement - one wound X- 4 5 Complex Wound Measurement - multiple wounds INTERVENTIONS - Wound Dressings []  - Small Wound Dressing one or multiple wounds 0 X- 3 15 Medium Wound Dressing one or multiple wounds []  - 0 Large Wound Dressing one or multiple wounds []  - 0 Application of Medications - topical []  - 0 Application of Medications - injection INTERVENTIONS - Miscellaneous []  - External ear exam 0 []  - 0 Specimen Collection (cultures, biopsies, blood, body fluids, etc.) []  - 0 Specimen(s) / Culture(s) sent or taken to Lab for analysis []  - 0 Patient Transfer (multiple staff / Civil Service fast streamer / Similar devices) []  - 0 Simple Staple / Suture removal (25 or less) []  - 0 Complex Staple / Suture removal (26 or more) []  - 0 Hypo / Hyperglycemic Management (close monitor of Blood Glucose) []  - 0 Ankle / Brachial Index (ABI) - do not check if billed separately X- 1 5 Vital Signs Has the patient been seen at the hospital within the last three years: Yes Total Score: 155 Level Of Care: New/Established - Level 4 Electronic Signature(s) Signed: 06/15/2019 4:36:22 PM By: Army Melia Entered By: Army Melia on 06/15/2019 16:00:07 Sandy Roberson ([id number]) -------------------------------------------------------------------------------- Encounter Discharge Information Details Patient Name: Sandy Roberson Date of Service: 06/15/2019 3:30 PM Medical Record Number: [id number] Patient Account Number: [id number] Date of Birth/Sex: 16-Mar-1916 (86 y.o.  F) Treating RN: Army Melia Primary Care Brittley Regner: Tracie Harrier Other Clinician: Referring Nolah Krenzer: Tracie Harrier Treating Edda Orea/Extender: Melburn Hake, HOYT Weeks in Treatment: 11 Encounter Discharge Information Items Discharge Condition: Stable Ambulatory Status: Wheelchair Discharge Destination: Home Transportation: Private Auto Accompanied By: son Schedule Follow-up Appointment: Yes Clinical Summary of Care: Electronic Signature(s) Signed: 06/15/2019 4:36:22 PM By: Army Melia Entered By: Army Melia on 06/15/2019 16:00:54 Sandy Roberson ([id number]) -------------------------------------------------------------------------------- Lower Extremity Assessment Details Patient Name: Sandy Roberson Date of Service: 06/15/2019 3:30 PM Medical Record Number: [id number] Patient Account Number: [id number] Date of Birth/Sex: 1915/07/10 (86 y.o. F) Treating RN: Army Melia Primary Care Annasofia Pohl: Tracie Harrier Other Clinician: Referring Paxton Binns: Tracie Harrier Treating Jenny Omdahl/Extender: STONE III, HOYT Weeks in Treatment: 11 Edema Assessment Assessed: [Left: No] [Right: No] Edema: [Left: No] [Right: No] Vascular Assessment Pulses: Dorsalis Pedis Palpable: [Left:Yes] [Right:Yes] Electronic Signature(s) Signed: 06/15/2019 4:36:22 PM By: Army Melia Entered By: Army Melia on 06/15/2019 15:44:54 Sandy Roberson ([id number]) -------------------------------------------------------------------------------- Multi Wound Chart Details Patient Name: Sandy Roberson Date of Service: 06/15/2019 3:30 PM Medical Record Number: [id number] Patient Account Number: [id number] Date of Birth/Sex: 1915-10-07 (86 y.o. 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 Pressure Ulcer Comorbid History: Anemia, Congestive Heart Failure, Anemia, Congestive Heart Failure, Anemia, Congestive Heart Failure, Coronary Artery Disease, Coronary Artery Disease, Coronary Artery Disease, Hypertension, Peripheral Arterial Hypertension, Peripheral Arterial Hypertension, Peripheral Arterial Disease, Peripheral Venous Disease, Peripheral Venous Disease, Peripheral Venous Disease, End Stage Renal Disease Disease, End Stage Renal Disease Disease, End  Stage Renal Disease Date Acquired: 03/16/2018 04/18/2019 10/15/2018 Weeks of Treatment: 11 8 8  Wound Status: Open Open Open Pending Amputation on Yes No No Presentation: Measurements L x W x D (cm) 2.2x2x0.6 3x2.6x0.1 0x0x0 Area (cm) : 3.456 6.126 0 Volume (cm) : 2.073 0.613 0 % Reduction in Area: 67.60% 39.10% 100.00% % Reduction in Volume: 90.30% 39.00% 100.00% Classification: Full Thickness With Exposed Partial Thickness Category/Stage II Support Structures Exudate Amount: Medium Large None Present Exudate Type: Serous Serous N/A Exudate Color: amber amber N/A Foul Odor After Cleansing: Yes No No Odor Anticipated Due to Product No N/A N/A Use: Wound Margin: Flat and Intact Flat and Intact Flat and Intact Granulation Amount: Small (1-33%) Medium (34-66%) None Present (0%) Granulation Quality: Pink Pink N/A Necrotic Amount: Large (67-100%) Medium (34-66%) None Present (0%) Exposed Structures: Fat Layer (Subcutaneous Tissue) Fat Layer (Subcutaneous Tissue) Fascia: No Exposed: Yes Exposed: Yes Fat Layer (Subcutaneous Tissue) Muscle: Yes Fascia: No Exposed: No Bone: Yes Tendon:  No Tendon: No Fascia: No Muscle: No Muscle: No Tendon: No Joint: No Joint: No Joint: No Bone: No Bone: No Epithelialization: None None Large (67-100%) Sandy Roberson, Sandy Roberson ([id number]) Wound Number: 5 N/A N/A Photos: N/A N/A Wound Location: Left Toe Third N/A N/A Wounding Event: Gradually Appeared N/A N/A Primary Etiology: Arterial Insufficiency Ulcer N/A N/A Comorbid History: Anemia, Congestive Heart Failure, N/A N/A Coronary Artery Disease, Hypertension, Peripheral Arterial Disease, Peripheral Venous Disease, End Stage Renal Disease Date Acquired: 04/17/2019 N/A N/A Weeks of Treatment: 5 N/A N/A Wound Status: Open N/A N/A Pending Amputation on No N/A N/A Presentation: Measurements L x W x D (cm) 0.5x0.6x0.1 N/A N/A Area (cm) : 0.236 N/A N/A Volume (cm) : 0.024 N/A N/A % Reduction in Area: 93.30% N/A N/A % Reduction in Volume: 93.20% N/A N/A Classification: Full Thickness Without Exposed N/A N/A Support Structures Exudate Amount: Medium N/A N/A Exudate Type: Serosanguineous N/A N/A Exudate Color: red, brown N/A N/A Foul Odor After Cleansing: No N/A N/A Odor Anticipated Due to Product N/A N/A N/A Use: Wound Margin: N/A N/A N/A Granulation Amount: Medium (34-66%) N/A N/A Granulation Quality: Red N/A N/A Necrotic Amount: Medium (34-66%) N/A N/A Exposed Structures: Fat Layer (Subcutaneous Tissue) N/A N/A Exposed: Yes Fascia: No Tendon: No Muscle: No Joint: No Bone: No Epithelialization: None N/A N/A Treatment Notes Electronic Signature(s) Signed: 06/15/2019 4:36:22 PM By: Army Melia Entered By: Army Melia on 06/15/2019 15:45:04 Sandy Roberson ([id number]) -------------------------------------------------------------------------------- Multi-Disciplinary Care Plan Details Patient Name: Sandy Roberson Date of Service: 06/15/2019 3:30 PM Medical Record Number: [id number] Patient Account Number: [id number] Date of Birth/Sex: 03-18-1916 (86 y.o.  F) Treating RN: Army Melia Primary Care Kamika Goodloe: Tracie Harrier Other Clinician: Referring Yasseen Salls: Tracie Harrier Treating Tinnie Kunin/Extender: Melburn Hake, HOYT Weeks in Treatment: 11 Active Inactive Venous Leg Ulcer Nursing Diagnoses: Knowledge deficit related to disease process and management Goals: Patient/caregiver will verbalize understanding of disease process and disease management Date Initiated: 03/30/2019 Target Resolution Date: 03/30/2019 Goal Status: Active Interventions: Assess peripheral edema status every visit. Notes: Wound/Skin Impairment Nursing Diagnoses: Impaired tissue integrity Knowledge deficit related to ulceration/compromised skin integrity Goals: Ulcer/skin breakdown will have a volume reduction of 30% by week 4 Date Initiated: 03/30/2019 Target Resolution Date: 04/30/2019 Goal Status: Active Interventions: Assess patient/caregiver ability to obtain necessary supplies Assess patient/caregiver ability to perform ulcer/skin care regimen upon admission and as needed Assess ulceration(s) every visit Provide education on ulcer and skin care Notes: Electronic Signature(s) Signed: 06/15/2019 4:36:22 PM By: Army Melia Entered By: Army Melia on 06/15/2019 15:44:57  Sandy Roberson, Sandy Roberson ([id number]) -------------------------------------------------------------------------------- Pain Assessment Details Patient Name: Sandy Roberson, Sandy Roberson Date of Service: 06/15/2019 3:30 PM Medical Record Number: [id number] Patient Account Number: [id number] Date of Birth/Sex: July 04, 1915 (86 y.o. F) Treating RN: Army Melia Primary Care Dearia Wilmouth: Tracie Harrier Other Clinician: Referring Alondra Vandeven: Tracie Harrier Treating Jaser Fullen/Extender: Melburn Hake, HOYT Weeks in Treatment: 11 Active Problems Location of Pain Severity and Description of Pain Patient Has Paino No Site Locations Pain Management and Medication Current Pain Management: Electronic  Signature(s) Signed: 06/15/2019 4:36:22 PM By: Army Melia Entered By: Army Melia on 06/15/2019 15:39:44 Sandy Roberson ([id number]) -------------------------------------------------------------------------------- Patient/Caregiver Education Details Patient Name: Sandy Roberson Date of Service: 06/15/2019 3:30 PM Medical Record Number: [id number] Patient Account Number: [id number] Date of Birth/Gender: [birth year] (86 y.o. F) Treating RN: Army Melia Primary Care Physician: Tracie Harrier Other Clinician: Referring Physician: Tracie Harrier Treating Physician/Extender: Sharalyn Ink in Treatment: 11 Education Assessment Education Provided To: Patient Education Topics Provided Wound/Skin Impairment: Handouts: Caring for Your Ulcer Methods: Demonstration, Explain/Verbal Responses: State content correctly Electronic Signature(s) Signed: 06/15/2019 4:36:22 PM By: Army Melia Entered By: Army Melia on 06/15/2019 16:00:19 Sandy Roberson ([id number]) -------------------------------------------------------------------------------- Wound Assessment Details Patient Name: Sandy Roberson Date of Service: 06/15/2019 3:30 PM Medical Record Number: [id number] Patient Account Number: [id number] Date of Birth/Sex: Mar 20, 1916 (86 y.o. F) Treating RN: Army Melia Primary Care Sabrina Keough: Tracie Harrier Other Clinician: Referring Catrice Zuleta: Tracie Harrier Treating Platon Arocho/Extender: Melburn Hake, HOYT Weeks in Treatment: 11 Wound Status Wound Number: 1 Primary Arterial Insufficiency Ulcer Etiology: Wound Location: Right, Medial Metatarsal head first Wound Open Wounding Event: Gradually Appeared Status: Date Acquired: 03/16/2018 Comorbid Anemia, Congestive Heart Failure, Coronary Artery Disease, Weeks Of Treatment: 11 History: Hypertension, Peripheral Arterial Disease, Peripheral Clustered Wound: No Venous Disease, End Stage Renal Disease Pending Amputation  On Presentation Photos Wound Measurements Length: (cm) 2.2 Width: (cm) 2 Depth: (cm) 0.6 Area: (cm) 3.456 Volume: (cm) 2.073 % Reduction in Area: 67.6% % Reduction in Volume: 90.3% Epithelialization: None Tunneling: No Undermining: No Wound Description Classification: Full Thickness With Exposed Support Structur Wound Margin: Flat and Intact Exudate Amount: Medium Exudate Type: Serous Exudate Color: amber es Foul Odor After Cleansing: Yes Due to Product Use: No Slough/Fibrino Yes Wound Bed Granulation Amount: Small (1-33%) Exposed Structure Granulation Quality: Pink Fascia Exposed: No Necrotic Amount: Large (67-100%) Fat Layer (Subcutaneous Tissue) Exposed: Yes Necrotic Quality: Adherent Slough Tendon Exposed: No Muscle Exposed: Yes Necrosis of Muscle: Yes Joint Exposed: No Bone Exposed: Yes Treatment Notes Wound #1 (Right, Medial Metatarsal head first) Notes prisma, ABD, conform, tubi on left leg Sandy Roberson, Sandy Roberson ([id number]) Electronic Signature(s) Signed: 06/15/2019 4:36:22 PM By: Army Melia Entered By: Army Melia on 06/15/2019 15:57:46 Sandy Roberson ([id number]) -------------------------------------------------------------------------------- Wound Assessment Details Patient Name: Sandy Roberson Date of Service: 06/15/2019 3:30 PM Medical Record Number: [id number] Patient Account Number: [id number] Date of Birth/Sex: June 12, 1915 (86 y.o. F) Treating RN: Army Melia Primary Care Jeda Pardue: Tracie Harrier Other Clinician: Referring Alyla Pietila: Tracie Harrier Treating Hazyl Marseille/Extender: Melburn Hake, HOYT Weeks in Treatment: 11 Wound Status Wound Number: 2 Primary Venous Leg Ulcer Etiology: Wound Location: Left, Medial Lower Leg Wound Open Wounding Event: Blister Status: Date Acquired: 04/18/2019 Comorbid Anemia, Congestive Heart Failure, Coronary Artery Disease, Weeks Of Treatment: 8 History: Hypertension, Peripheral Arterial Disease,  Peripheral Venous Clustered Wound: No Disease, End Stage Renal Disease Photos Wound Measurements Length: (cm) 3 % Width: (cm) 2.6 % Depth: (cm) 0.1 E Area: (cm) 6.126 Volume: (cm) 0.613 Reduction in Area: 39.1% Reduction in Volume: 39% pithelialization: None Tunneling: No Undermining: No  Wound Description Classification: Partial Thickness F Wound Margin: Flat and Intact S Exudate Amount: Large Exudate Type: Serous Exudate Color: amber oul Odor After Cleansing: No lough/Fibrino No Wound Bed Granulation Amount: Medium (34-66%) Exposed Structure Granulation Quality: Pink Fascia Exposed: No Necrotic Amount: Medium (34-66%) Fat Layer (Subcutaneous Tissue) Exposed: Yes Necrotic Quality: Adherent Slough Tendon Exposed: No Muscle Exposed: No Joint Exposed: No Bone Exposed: No Treatment Notes Wound #2 (Left, Medial Lower Leg) Notes prisma, ABD, conform, tubi on left leg Electronic Signature(s) Sandy Roberson, Sandy Roberson ([id number]) Signed: 06/15/2019 4:36:22 PM By: Army Melia Entered By: Army Melia on 06/15/2019 15:57:50 Sandy Roberson ([id number]) -------------------------------------------------------------------------------- Wound Assessment Details Patient Name: Sandy Roberson Date of Service: 06/15/2019 3:30 PM Medical Record Number: [id number] Patient Account Number: [id number] Date of Birth/Sex: 16-Aug-1915 (86 y.o. F) Treating RN: Army Melia Primary Care Alexsandro Salek: Tracie Harrier Other Clinician: Referring Nyimah Shadduck: Tracie Harrier Treating Christopherjame Carnell/Extender: Melburn Hake, HOYT Weeks in Treatment: 11 Wound Status Wound Number: 4 Primary Pressure Ulcer Etiology: Wound Location: Right Gluteus Wound Healed - Epithelialized Wounding Event: Pressure Injury Status: Date Acquired: 10/15/2018 Comorbid Anemia, Congestive Heart Failure, Coronary Artery Disease, Weeks Of Treatment: 8 History: Hypertension, Peripheral Arterial Disease, Peripheral  Venous Clustered Wound: No Disease, End Stage Renal Disease Photos Wound Measurements Length: (cm) 0 Width: (cm) 0 Depth: (cm) 0 Area: (cm) Volume: (cm) % Reduction in Area: 100% % Reduction in Volume: 100% Epithelialization: Large (67-100%) 0 Tunneling: No 0 Undermining: No Wound Description Classification: Category/Stage II Wound Margin: Flat and Intact Exudate Amount: None Present Foul Odor After Cleansing: No Slough/Fibrino Yes Wound Bed Granulation Amount: None Present (0%) Exposed Structure Necrotic Amount: None Present (0%) Fascia Exposed: No Fat Layer (Subcutaneous Tissue) Exposed: No Tendon Exposed: No Muscle Exposed: No Joint Exposed: No Bone Exposed: No Electronic Signature(s) Signed: 06/15/2019 4:36:22 PM By: Army Melia Entered By: Army Melia on 06/15/2019 15:57:54 Sandy Roberson ([id number]) -------------------------------------------------------------------------------- Wound Assessment Details Patient Name: Sandy Roberson Date of Service: 06/15/2019 3:30 PM Medical Record Number: [id number] Patient Account Number: [id number] Date of Birth/Sex: Mar 19, 1916 (86 y.o. F) Treating RN: Army Melia Primary Care Mandeep Ferch: Tracie Harrier Other Clinician: Referring Jeralyn Nolden: Tracie Harrier Treating Graysen Depaula/Extender: Melburn Hake, HOYT Weeks in Treatment: 11 Wound Status Wound Number: 5 Primary Arterial Insufficiency Ulcer Etiology: Wound Location: Left Toe Third Wound Open Wounding Event: Gradually Appeared Status: Date Acquired: 04/17/2019 Comorbid Anemia, Congestive Heart Failure, Coronary Artery Disease, Weeks Of Treatment: 5 History: Hypertension, Peripheral Arterial Disease, Peripheral Clustered Wound: No Venous Disease, End Stage Renal Disease Photos Wound Measurements Length: (cm) 0.5 Width: (cm) 0.6 Depth: (cm) 0.1 Area: (cm) 0.236 Volume: (cm) 0.024 % Reduction in Area: 93.3% % Reduction in Volume:  93.2% Epithelialization: None Tunneling: No Undermining: No Wound Description Classification: Full Thickness Without Exposed Support Struct Exudate Amount: Medium Exudate Type: Serosanguineous Exudate Color: red, brown ures Foul Odor After Cleansing: No Slough/Fibrino Yes Wound Bed Granulation Amount: Medium (34-66%) Exposed Structure Granulation Quality: Red Fascia Exposed: No Necrotic Amount: Medium (34-66%) Fat Layer (Subcutaneous Tissue) Exposed: Yes Necrotic Quality: Adherent Slough Tendon Exposed: No Muscle Exposed: No Joint Exposed: No Bone Exposed: No Treatment Notes Wound #5 (Left Toe Third) Notes prisma, ABD, conform, tubi on left leg Electronic Signature(s) Signed: 06/15/2019 4:36:22 PM By: Sandy Roberson, Sandy Roberson ([id number]) Entered By: Army Melia on 06/15/2019 15:57:57 Sandy Roberson ([id number]) -------------------------------------------------------------------------------- Vitals Details Patient Name: Sandy Roberson Date of Service: 06/15/2019 3:30 PM Medical Record Number: [id number] Patient Account Number: [id number] Date of Birth/Sex: 1915-07-23 (86 y.o. F) Treating RN: Primary Care Zikeria Keough: Ginette Pitman,  Vishwanath Other Clinician: Referring Caidance Sybert: Tracie Harrier Treating Braylin Xu/Extender: STONE III, HOYT Weeks in Treatment: 11 Vital Signs Time Taken: 15:15 Temperature (F): 98.4 Height (in): 63 Pulse (bpm): 102 Weight (lbs): 118 Respiratory Rate (breaths/min): 16 Body Mass Index (BMI): 20.9 Blood Pressure (mmHg): 136/62 Reference Range: 80 - 120 mg / dl Electronic Signature(s) Signed: 06/15/2019 4:02:24 PM By: Lorine Bears RCP, RRT, CHT Entered By: Lorine Bears on 06/15/2019 15:20:47

## 2019-06-15 NOTE — Progress Notes (Addendum)
Sandy Roberson ([id number]) Visit Report for 06/15/2019 Chief Complaint Document Details Patient Name: Sandy Roberson Date of Service: 06/15/2019 3:30 PM Medical Record Number: [id number] Patient Account Number: [id number] Date of Birth/Sex: 22-Jan-1916 (86 y.o. F) Treating RN: Primary Care Provider: Tracie Harrier Other Clinician: Referring Provider: Tracie Harrier Treating Provider/Extender: Melburn Hake, Erion Weightman Weeks in Treatment: 11 Information Obtained from: Patient Chief Complaint Right medial foot surgical ulcer, Left LE ulcers, and right gluteal ulcer Electronic Signature(s) Signed: 06/15/2019 3:23:29 PM By: Worthy Keeler PA-C Entered By: Worthy Keeler on 06/15/2019 15:23:29 Sandy Roberson ([id number]) -------------------------------------------------------------------------------- HPI Details Patient Name: Sandy Roberson Date of Service: 06/15/2019 3:30 PM Medical Record Number: [id number] Patient Account Number: [id number] Date of Birth/Sex: 08/16/1915 (86 y.o. F) Treating RN: Primary Care Provider: Tracie Harrier Other Clinician: Referring Provider: Tracie Harrier Treating Provider/Extender: Melburn Hake, Kemi Gell Weeks in Treatment: 11 History of Present Illness HPI Description: 03/30/2019 on evaluation today patient presents for initial evaluation here in our clinic as a referral from Dr. Selina Cooley office who is a local podiatrist. Unfortunately the patient has experienced significant issues with decreased ability to heal in regard to a surgical site and again this is a fairly complicated history. The patient has undergone 3 vascular interventions the most recent of which was actually performed on 12//20. This was subsequent to the patient having ABIs which were somewhat poor registering at 0.67 on the right with a TBI of 0.16 and on the left she was 0.77 with a TBI of 0.24. Subsequently the patient had had surgery to actually remove a portion of the first  metatarsal on her right foot. They did not remove the toe although again from the patient's standpoint I am not 100% sure exactly the reasoning behind what was done or was not done but again I think a big part of the issue was trying to be as minimally invasive for the patient as possible do #1 to her arterial status #2 due to her age. Nonetheless unfortunately she has had some trouble with healing and when the sutures were removed the wound apparently dehisced. The patient does currently have more recent vascular intervention which again was on December 4 with Dr. Lazaro Arms and at this point the patient did have angioplasty which upon completion showed inline flow into the foot with less than 20% residual stenosis after angioplasties. There were multiple sites which had to be worked on during the procedure by Dr. Lucky Cowboy. Again the patient has very problematic blood flow with regard to her arterial status at this point. She does have a history of dysphagia, stage III kidney disease, and congestive heart failure as well. 04/06/2019 on evaluation today patient appears to be doing in my opinion about the same there may be slightly better with regard to the overall. With that being said she still is healing slowly obviously has only been 1 week and now the wounds do appear to be clean enough I still think there is can be quite a bit of work to do going forward before will be where we really need to be here. She does have an appointment with vascular on Wednesday which is just 2 days away for repeat vascular studies along with evaluation with the physician as well. This will be good information for [place] to know going forward as far as her arterial status and what we can or cannot do from the standpoint of debridement or otherwise. 12/28-Patient returns at 1 week with the right medial metatarsal wound, this appears to be  better with the rim of granulation tissue noted. We have been using Iodoflex. Patient had vascular  studies done that indicate good flow to this foot, the left foot has ABI of 0.59 and a TBI 0.34 and patient has vascular follow-up for revascularization efforts. 04/20/2019 on evaluation today patient's right first metatarsal ulcer actually appears to be doing about the same is making slow progress. With that being said she unfortunately has blisters over the left lower extremity, left medial ankle, and she still has apparently a pressure ulcer in the gluteal region which is in the right gluteus and is completely new to [place]. This is something that was not known of previous. In fact they did not even tell us about it today until at the end of the visit when we were getting ready to go and her son mention this. 04/27/2019 upon evaluation today patient appears to be doing excellent with regard to her wounds all things considered. I do feel like she is making some progress here she did have her vascular intervention/angiogram which was on 04/23/2019. With that being said I do not have the records for review today as the note is not actually in yet with looking in the system. Nonetheless Dr. Lucky Cowboy did perform this procedure. The patient has had some increased swelling since the procedure in the left lower extremity but this is to be expected. Obviously that will cause the wounds to drain more. Nonetheless overall I do not see any signs of active infection. Her right foot ulcer seems to be making progress albeit slow. 05/08/2019 on evaluation today patient appears to be doing well with regard to her wounds in general. I feel like that other than the fact that her toe ulcer on the left third toe worsened from where she had just a small blood blister to where it actually developed into more of a wound. This is something that now hopefully should reverse now that she has had the intervention on the left side and hopefully is better able to heal some of these wound areas in fact I really feel like a lot is drying up and  appearing much better than what it was previous. Fortunately there is no signs of active infection at this time. No fevers, chills, nausea, vomiting, or diarrhea. 05/15/2019 on evaluation today patient appears to be doing okay in general with regard to her wounds. The wound in the first metatarsal region on the right seems to be doing quite well. I see definite improvement. The lower extremity ulcers both appear to be doing well on the leg as well as the ankle on the left and the right gluteal ulcer is very dry I think we may need to use collagen and a dressing to keep this more moist allow to heal more appropriately. 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 05/22/2019 upon evaluation today patient actually appears to be making some progress here which is excellent news. She is specifically having good improvement with regard to her left ankle which appears healed the left lower extremity is making good progress. The left toe also seems to be improving. All of this I think was due to the procedure or rather lack of procedure on this leg that she finally had done she did have a follow-up with  vascular and they noted that her blood flow is now excellent following her vascular intervention. Nonetheless overall I am very pleased. Her right foot ulcer is doing much better and I do feel like it is healing quite nicely is just taking its time but nonetheless were seeing improvement. The gluteal wound also appears to be smaller and better today overall I am very pleased with how things are progressing. 05/29/2019 on evaluation today patient seems to be making improvement in regard to her wounds in general. Everything is showing signs of improvement in my opinion based on what I am seeing. Fortunately there is no evidence of  infection at any site which is also good news. 06/05/2019 upon inspection today patient is to be doing still somewhat better in regard to her wounds in general. Fortunately there is no signs of active infection at this time. No fever chills noted in general I feel like that she is making excellent progress at this time. This is definitely very slow but again Mcgaug[place], Moreen ([id number]) that is understandable considering the poor blood flow she previously had. 06/15/2019 upon evaluation today patient appears to be doing extremely well in regard to all of her wounds. Her wound on the gluteal region actually appears to be completely healed which is great news. The wounds on her left leg, left toe, and right foot all are measuring smaller and looking better. In general I feel like we are headed in the right direction as far as this is concerned. No fevers, chills, nausea, vomiting, or diarrhea. Electronic Signature(s) Signed: 06/15/2019 4:04:03 PM By: Worthy Keeler PA-C Entered By: Worthy Keeler on 06/15/2019 16:04:03 Sandy Roberson ([id number]) -------------------------------------------------------------------------------- Physical Exam Details Patient Name: Sandy Roberson Date of Service: 06/15/2019 3:30 PM Medical Record Number: [id number] Patient Account Number: [id number] Date of Birth/Sex: 02/28/1916 (86 y.o. F) Treating RN: Primary Care Provider: Tracie Harrier Other Clinician: Referring Provider: Tracie Harrier Treating Provider/Extender: STONE III, Marjorie Lussier Weeks in Treatment: 65 Constitutional Well-nourished and well-hydrated in no acute distress. Respiratory normal breathing without difficulty. Psychiatric this patient is able to make decisions and demonstrates good insight into disease process. Alert and Oriented x 3. pleasant and cooperative. Notes Upon inspection patient's wounds currently showed signs of good granulation at this time and again I am extremely  pleased. I was able to mechanically debride things away today no sharp debridement was necessary and in general I feel like she is doing extremely well. Electronic Signature(s) Signed: 06/15/2019 4:04:20 PM By: Worthy Keeler PA-C Entered By: Worthy Keeler on 06/15/2019 16:04:20 Sandy Roberson ([id number]) -------------------------------------------------------------------------------- Physician Orders Details Patient Name: Sandy Roberson Date of Service: 06/15/2019 3:30 PM Medical Record Number: [id number] Patient Account Number: [id number] Date of Birth/Sex: [birth year] (86 y.o. F) Treating RN: Army Melia Primary Care Provider: Tracie Harrier Other Clinician: Referring Provider: Tracie Harrier Treating Provider/Extender: Melburn Hake, Rithwik Schmieg Weeks in Treatment: 11 Verbal / Phone Orders: No Diagnosis Coding ICD-10 Coding Code Description I73.89 Other specified peripheral vascular diseases T81.31XA Disruption of external operation (surgical) wound, not elsewhere classified, initial encounter L97.513 Non-pressure chronic ulcer of other part of right foot with necrosis of muscle L89.312 Pressure ulcer of right buttock, stage 2 L97.821 Non-pressure chronic ulcer of other part of left lower leg limited to breakdown of skin 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 R13.12 Dysphagia, oropharyngeal phase N18.30 Chronic kidney disease, stage 3 unspecified I50.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 (congestive) heart failure Wound  Cleansing Wound #1 Right,Medial Metatarsal head first o Dial antibacterial soap, wash wounds, rinse and pat dry prior to dressing wounds Wound #2 Left,Medial Lower Leg o Dial antibacterial soap, wash wounds, rinse and pat dry prior to dressing wounds Wound #5 Left Toe Third o Dial antibacterial soap, wash wounds, rinse and pat dry prior to dressing  wounds Primary Wound Dressing Wound #1 Right,Medial Metatarsal head first o Silver Collagen - cut to fit drawtex to fill extra space Wound #2 Left,Medial Lower Leg o Silver Collagen - cut to fit drawtex to fill extra space Wound #5 Left Toe Third o Silver Collagen - cut to fit drawtex to fill extra space Secondary Dressing Wound #1 Right,Medial Metatarsal head first o ABD pad o Conform/Kerlix - conform to secure Wound #2 Left,Medial Lower Leg o ABD pad o Conform/Kerlix - conform to secure Wound #5 Left Toe Third o ABD pad o Conform/Kerlix - conform to secure o Other - bandaid Dressing Change Frequency Wound #1 Right,Medial Metatarsal head first o Change Dressing Monday, Wednesday, Friday NADAL, Marsheila ([id number]) Wound #2 Left,Medial Lower Leg o Change Dressing Monday, Wednesday, Friday Wound #5 Left Toe Third o Change Dressing Monday, Wednesday, Friday Follow-up Appointments Wound #1 Right,Medial Metatarsal head first o Return Appointment in 1 week. Wound #2 Left,Medial Lower Leg o Return Appointment in 1 week. Wound #5 Left Toe Third o Return Appointment in 1 week. Edema Control Wound #1 Right,Medial Metatarsal head first o Other: - Tubi Grip single layer Wound #2 Left,Medial Lower Leg o Other: - Tubi Grip single layer Wound #5 Left Toe Third o Other: - Tubi Grip single layer Home Health Wound #1 Right,Medial Metatarsal head first o [place] Visits - [place] Nurse may visit PRN to address patientos wound care needs. o Home Health Nurse may visit PRN to address patientos wound care needs. o FACE TO FACE ENCOUNTER: MEDICARE and MEDICAID PATIENTS: I certify that this patient is under my care and that I had a face-to- face encounter that meets the physician face-to-face encounter requirements with this patient on this date. The encounter with the patient was in whole or in part for the  following MEDICAL CONDITION: (primary reason for [place]) MEDICAL NECESSITY: I certify, that based on my findings, NURSING services are a medically necessary home health service. HOME BOUND STATUS: I certify that my clinical findings support that this patient is homebound (i.e., Due to illness or injury, pt requires aid of supportive devices such as crutches, cane, wheelchairs, walkers, the use of special transportation or the assistance of another person to leave their place of residence. There is a normal inability to leave the home and doing so requires considerable and taxing effort. Other absences are for medical reasons / religious services and are infrequent or of short duration when for other reasons). o FACE TO FACE ENCOUNTER: MEDICARE and MEDICAID PATIENTS: I certify that this patient is under my care and that I had a face-to- face encounter that meets the physician face-to-face encounter requirements with this patient on this date. The encounter with the patient was in whole or in part for the following MEDICAL CONDITION: (primary reason for [place]) MEDICAL NECESSITY: I certify, that based on my findings, NURSING services are a medically necessary home health service. HOME BOUND STATUS: I certify that my clinical findings support that this patient is homebound (i.e., Due to illness or injury, pt requires aid of supportive devices such as crutches, cane, wheelchairs, walkers, the use of  special transportation or the assistance of another person to leave their place of residence. There is a normal inability to leave the home and doing so requires considerable and taxing effort. Other absences are for medical reasons / religious services and are infrequent or of short duration when for other reasons). o If current dressing causes regression in wound condition, may D/C ordered dressing product/s and apply Normal Saline Moist Dressing daily until next [place] /  Other MD appointment. [place] of regression in wound condition at [phone]. o If current dressing causes regression in wound condition, may D/C ordered dressing product/s and apply Normal Saline Moist Dressing daily until next [place] / Other MD appointment. [place] of regression in wound condition at [phone]. o Please direct any NON-WOUND related issues/requests for orders to patient's Primary Care Physician o Please direct any NON-WOUND related issues/requests for orders to patient's Primary Care Physician Wound #2 Left,Medial Lower Leg o [place] Visits - [place] Nurse may visit PRN to address patientos wound care needs. o Home Health Nurse may visit PRN to address patientos wound care needs. o FACE TO FACE ENCOUNTER: MEDICARE and MEDICAID PATIENTS: I certify that this patient is under my care and that I had a face-to- face encounter that meets the physician face-to-face encounter requirements with this patient on this date. The encounter with the patient was in whole or in part for the following MEDICAL CONDITION: (primary reason for [place]) MEDICAL NECESSITY: I certify, that based on my findings, NURSING services are a medically necessary home health service. HOME BOUND STATUS: I certify that my clinical findings support that this patient is homebound (i.e., Due to illness or injury, pt requires aid of supportive devices such as crutches, cane, wheelchairs, walkers, the use of special transportation or the assistance of another person to leave Sandy Roberson, Sandy Roberson ([id number]) their place of residence. There is a normal inability to leave the home and doing so requires considerable and taxing effort. Other absences are for medical reasons / religious services and are infrequent or of short duration when for other reasons). o FACE TO FACE ENCOUNTER: MEDICARE and MEDICAID PATIENTS:  I certify that this patient is under my care and that I had a face-to- face encounter that meets the physician face-to-face encounter requirements with this patient on this date. The encounter with the patient was in whole or in part for the following MEDICAL CONDITION: (primary reason for [place]) MEDICAL NECESSITY: I certify, that based on my findings, NURSING services are a medically necessary home health service. HOME BOUND STATUS: I certify that my clinical findings support that this patient is homebound (i.e., Due to illness or injury, pt requires aid of supportive devices such as crutches, cane, wheelchairs, walkers, the use of special transportation or the assistance of another person to leave their place of residence. There is a normal inability to leave the home and doing so requires considerable and taxing effort. Other absences are for medical reasons / religious services and are infrequent or of short duration when for other reasons). o If current dressing causes regression in wound condition, may D/C ordered dressing product/s and apply Normal Saline Moist Dressing daily until next [place] / Other MD appointment. [place] of regression in wound condition at [phone]. o If current dressing causes regression in wound condition, may D/C ordered dressing product/s and apply Normal Saline Moist Dressing daily until next [facility] / Other MD appointment. [place] of regression in wound condition at [phone]. o Please direct any NON-WOUND related issues/requests for orders to patient's Primary Care Physician o Please direct any NON-WOUND related issues/requests for orders to patient's Primary Care Physician Wound #5 Left Toe [place] Visits - [place] Nurse may visit PRN to address patientos wound care needs. o Home Health Nurse may visit PRN to address patientos  wound care needs. o FACE TO FACE ENCOUNTER: MEDICARE and MEDICAID PATIENTS: I certify that this patient is under my care and that I had a face-to- face encounter that meets the physician face-to-face encounter requirements with this patient on this date. The encounter with the patient was in whole or in part for the following MEDICAL CONDITION: (primary reason for [place]) MEDICAL NECESSITY: I certify, that based on my findings, NURSING services are a medically necessary home health service. HOME BOUND STATUS: I certify that my clinical findings support that this patient is homebound (i.e., Due to illness or injury, pt requires aid of supportive devices such as crutches, cane, wheelchairs, walkers, the use of special transportation or the assistance of another person to leave their place of residence. There is a normal inability to leave the home and doing so requires considerable and taxing effort. Other absences are for medical reasons / religious services and are infrequent or of short duration when for other reasons). o FACE TO FACE ENCOUNTER: MEDICARE and MEDICAID PATIENTS: I certify that this patient is under my care and that I had a face-to- face encounter that meets the physician face-to-face encounter requirements with this patient on this date. The encounter with the patient was in whole or in part for the following MEDICAL CONDITION: (primary reason for [place]) MEDICAL NECESSITY: I certify, that based on my findings, NURSING services are a medically necessary home health service. HOME BOUND STATUS: I certify that my clinical findings support that this patient is homebound (i.e., Due to illness or injury, pt requires aid of supportive devices such as crutches, cane, wheelchairs, walkers, the use of special transportation or the assistance of another person to leave their place of residence. There is a normal inability to leave the home and doing so requires considerable  and taxing effort. Other absences are for medical reasons / religious services and are infrequent or of short duration when for other reasons). o If current dressing causes regression in wound condition, may D/C ordered dressing product/s and apply Normal Saline Moist Dressing daily until next [place] / Other MD appointment. [place] of regression in wound condition at [phone]. o If current dressing causes regression in wound condition, may D/C ordered dressing product/s and apply Normal Saline Moist Dressing daily until next [place] / Other MD appointment. [place] of regression in wound condition at [phone]. o Please direct any NON-WOUND related issues/requests for orders to patient's Primary Care Physician o Please direct any NON-WOUND related issues/requests for orders to patient's Primary Care Physician Electronic Signature(s) Signed: 06/15/2019 4:36:22 PM By: Army Melia Signed: 06/15/2019 4:51:53 PM By: Worthy Keeler PA-C Entered By: Army Melia on 06/15/2019 15:59:07 Sandy Roberson ([id number]) -------------------------------------------------------------------------------- Problem List Details Patient Name: Sandy Roberson Date of Service: 06/15/2019 3:30 PM Medical Record Number: [id number] Patient Account Number: [id number] Date of Birth/Sex: [birth year] (86 y.o. F) Treating RN: Primary Care Provider: Tracie Harrier Other Clinician: Referring Provider: Tracie Harrier Treating Provider/Extender: Melburn Hake, Farrah Skoda Weeks  in Treatment: 11 Active Problems ICD-10 Evaluated Encounter Code Description Active Date Today Diagnosis I73.89 Other specified peripheral vascular diseases 03/30/2019 No Yes T81.31XA Disruption of external operation (surgical) wound, not elsewhere classified, 03/30/2019 No Yes initial encounter L97.513 Non-pressure chronic ulcer of other part of right foot with  necrosis of 03/30/2019 No Yes muscle L89.312 Pressure ulcer of right buttock, stage 2 04/22/2019 No Yes L97.821 Non-pressure chronic ulcer of other part of left lower leg limited to 04/22/2019 No Yes breakdown of skin 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 05/15/2019 No Yes without evidence of necrosis R13.12 Dysphagia, oropharyngeal phase 03/30/2019 No Yes N18.30 Chronic kidney disease, stage 3 unspecified 03/30/2019 No Yes I50.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 (congestive) heart 03/30/2019 No Yes failure Inactive Problems Resolved Problems Electronic Signature(s) Signed: 06/15/2019 3:23:23 PM By: Benjamine Sprague, [place] ([id number]) Entered By: Worthy Keeler on 06/15/2019 15:23:22 Sandy Roberson ([id number]) -------------------------------------------------------------------------------- Progress Note Details Patient Name: Sandy Roberson Date of Service: 06/15/2019 3:30 PM Medical Record Number: [id number] Patient Account Number: [id number] Date of Birth/Sex: [birth year] (86 y.o. F) Treating RN: Primary Care Provider: Tracie Harrier Other Clinician: Referring Provider: Tracie Harrier Treating Provider/Extender: Melburn Hake, Paz Fuentes Weeks in Treatment: 11 Subjective Chief Complaint Information obtained from Patient Right medial foot surgical ulcer, Left LE ulcers, and right gluteal ulcer History of Present Illness (HPI) 03/30/2019 on evaluation today patient presents for initial evaluation here in our clinic as a referral from Dr. Selina Cooley office who is a local podiatrist. Unfortunately the patient has experienced significant issues with decreased ability to heal in regard to a surgical site and again this is a fairly complicated history. The patient has undergone 3 vascular interventions the most recent of which was actually performed on 12//20. This was subsequent to the patient having ABIs  which were somewhat poor registering at 0.67 on the right with a TBI of 0.16 and on the left she was 0.77 with a TBI of 0.24. Subsequently the patient had had surgery to actually remove a portion of the first metatarsal on her right foot. They did not remove the toe although again from the patient's standpoint I am not 100% sure exactly the reasoning behind what was done or was not done but again I think a big part of the issue was trying to be as minimally invasive for the patient as possible do #1 to her arterial status #2 due to her age. Nonetheless unfortunately she has had some trouble with healing and when the sutures were removed the wound apparently dehisced. The patient does currently have more recent vascular intervention which again was on December 4 with Dr. Lazaro Arms and at this point the patient did have angioplasty which upon completion showed inline flow into the foot with less than 20% residual stenosis after angioplasties. There were multiple sites which had to be worked on during the procedure by Dr. Lucky Cowboy. Again the patient has very problematic blood flow with regard to her arterial status at this point. She does have a history of dysphagia, stage III kidney disease, and congestive heart failure as well. 04/06/2019 on evaluation today patient appears to be doing in my opinion about the same there may be slightly better with regard to the overall. With that being said she still is healing slowly obviously has only been 1 week and now the wounds do appear to be clean enough I still think there is can be quite a bit of work to  do going forward before will be where we really need to be here. She does have an appointment with vascular on Wednesday which is just 2 days away for repeat vascular studies along with evaluation with the physician as well. This will be good information for [place] to know going forward as far as her arterial status and what we can or cannot do from the standpoint of  debridement or otherwise. 12/28-Patient returns at 1 week with the right medial metatarsal wound, this appears to be better with the rim of granulation tissue noted. We have been using Iodoflex. Patient had vascular studies done that indicate good flow to this foot, the left foot has ABI of 0.59 and a TBI 0.34 and patient has vascular follow-up for revascularization efforts. 04/20/2019 on evaluation today patient's right first metatarsal ulcer actually appears to be doing about the same is making slow progress. With that being said she unfortunately has blisters over the left lower extremity, left medial ankle, and she still has apparently a pressure ulcer in the gluteal region which is in the right gluteus and is completely new to [place]. This is something that was not known of previous. In fact they did not even tell us about it today until at the end of the visit when we were getting ready to go and her son mention this. 04/27/2019 upon evaluation today patient appears to be doing excellent with regard to her wounds all things considered. I do feel like she is making some progress here she did have her vascular intervention/angiogram which was on 04/23/2019. With that being said I do not have the records for review today as the note is not actually in yet with looking in the system. Nonetheless Dr. Lucky Cowboy did perform this procedure. The patient has had some increased swelling since the procedure in the left lower extremity but this is to be expected. Obviously that will cause the wounds to drain more. Nonetheless overall I do not see any signs of active infection. Her right foot ulcer seems to be making progress albeit slow. 05/08/2019 on evaluation today patient appears to be doing well with regard to her wounds in general. I feel like that other than the fact that her toe ulcer on the left third toe worsened from where she had just a small blood blister to where it actually developed into more of a wound. This  is something that now hopefully should reverse now that she has had the intervention on the left side and hopefully is better able to heal some of these wound areas in fact I really feel like a lot is drying up and appearing much better than what it was previous. Fortunately there is no signs of active infection at this time. No fevers, chills, nausea, vomiting, or diarrhea. 05/15/2019 on evaluation today patient appears to be doing okay in general with regard to her wounds. The wound in the first metatarsal region on the right seems to be doing quite well. I see definite improvement. The lower extremity ulcers both appear to be doing well on the leg as well as the ankle on the left and the right gluteal ulcer is very dry I think we may need to use collagen and a dressing to keep this more moist allow to heal more appropriately. The arterial ulcer on the left third toe is going require some sharp debridement at this time there is a lot of necrotic tissue on the distal portion of the toe it is difficult to  even see how severe the wound bed really is. 05/22/2019 upon evaluation today patient actually appears to be making some progress here which is excellent news. She is specifically having good improvement with regard to her left ankle which appears healed the left lower extremity is making good progress. The left toe also seems to be improving. All of this I think was due to the procedure or rather lack of procedure on this leg that she finally had done she did have a follow-up with vascular and they noted that her blood flow is now excellent following her vascular intervention. Nonetheless overall I am very pleased. Her right foot ulcer is doing much better and I do feel like it is healing quite nicely is just taking its time but nonetheless were seeing improvement. The gluteal wound also appears to be smaller and better today overall I am very pleased with how things are progressing. Sandy Roberson, Sandy Roberson ([id number]) 05/29/2019 on evaluation today patient seems to be making improvement in regard to her wounds in general. Everything is showing signs of improvement in my opinion based on what I am seeing. Fortunately there is no evidence of infection at any site which is also good news. 06/05/2019 upon inspection today patient is to be doing still somewhat better in regard to her wounds in general. Fortunately there is no signs of active infection at this time. No fever chills noted in general I feel like that she is making excellent progress at this time. This is definitely very slow but again that is understandable considering the poor blood flow she previously had. 06/15/2019 upon evaluation today patient appears to be doing extremely well in regard to all of her wounds. Her wound on the gluteal region actually appears to be completely healed which is great news. The wounds on her left leg, left toe, and right foot all are measuring smaller and looking better. In general I feel like we are headed in the right direction as far as this is concerned. No fevers, chills, nausea, vomiting, or diarrhea. Objective Constitutional Well-nourished and well-hydrated in no acute distress. Vitals Time Taken: 3:15 PM, Height: 63 in, Weight: 118 lbs, BMI: 20.9, Temperature: 98.4 F, Pulse: 102 bpm, Respiratory Rate: 16 breaths/min, Blood Pressure: 136/62 mmHg. Respiratory normal breathing without difficulty. Psychiatric this patient is able to make decisions and demonstrates good insight into disease process. Alert and Oriented x 3. pleasant and cooperative. General Notes: Upon inspection patient's wounds currently showed signs of good granulation at this time and again I am extremely pleased. I was able to mechanically debride things away today no sharp debridement was necessary and in general I feel like she is doing extremely well. Integumentary (Hair, Skin) Wound #1 status is Open. Original cause of  wound was Gradually Appeared. The wound is located on the Right,Medial Metatarsal head first. The wound measures 2.2cm length x 2cm width x 0.6cm depth; 3.456cm^2 area and 2.073cm^3 volume. There is bone, muscle, and Fat Layer (Subcutaneous Tissue) Exposed exposed. There is no tunneling or undermining noted. There is a medium amount of serous drainage noted. Foul odor after cleansing was noted. The wound margin is flat and intact. There is small (1-33%) pink granulation within the wound bed. There is a large (67- 100%) amount of necrotic tissue within the wound bed including Adherent Slough and Necrosis of Muscle. Wound #2 status is Open. Original cause of wound was Blister. The wound is located on the Left,Medial Lower Leg. The wound measures 3cm length x 2.6cm  width x 0.1cm depth; 6.126cm^2 area and 0.613cm^3 volume. There is Fat Layer (Subcutaneous Tissue) Exposed exposed. There is no tunneling or undermining noted. There is a large amount of serous drainage noted. The wound margin is flat and intact. There is medium (34-66%) pink granulation within the wound bed. There is a medium (34-66%) amount of necrotic tissue within the wound bed including Adherent Slough. Wound #4 status is Healed - Epithelialized. Original cause of wound was Pressure Injury. The wound is located on the Right Gluteus. The wound measures 0cm length x 0cm width x 0cm depth; 0cm^2 area and 0cm^3 volume. There is no tunneling or undermining noted. There is a none present amount of drainage noted. The wound margin is flat and intact. There is no granulation within the wound bed. There is no necrotic tissue within the wound bed. Wound #5 status is Open. Original cause of wound was Gradually Appeared. The wound is located on the Left Toe Third. The wound measures 0.5cm length x 0.6cm width x 0.1cm depth; 0.236cm^2 area and 0.024cm^3 volume. There is Fat Layer (Subcutaneous Tissue) Exposed exposed. There is no tunneling or  undermining noted. There is a medium amount of serosanguineous drainage noted. There is medium (34-66%) red granulation within the wound bed. There is a medium (34-66%) amount of necrotic tissue within the wound bed including Adherent Slough. 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 Dysphagia, oropharyngeal phase Chronic kidney disease, stage 3 unspecified Chronic combined systolic (congestive) and diastolic (congestive) heart failure Plan Wound Cleansing: Wound #1 Right,Medial Metatarsal head first: Dial antibacterial soap, wash wounds, rinse and pat dry prior to dressing wounds Wound #2 Left,Medial Lower Leg: Dial antibacterial soap, wash wounds, rinse and pat dry prior to dressing wounds Wound #5 Left Toe Third: Dial antibacterial soap, wash wounds, rinse and pat dry prior to dressing wounds Primary Wound Dressing: Wound #1 Right,Medial Metatarsal head first: Silver Collagen - cut to fit drawtex to fill extra space Wound #2 Left,Medial Lower Leg: Silver Collagen - cut to fit drawtex to fill extra space Wound #5 Left Toe Third: Silver Collagen - cut to fit drawtex to fill extra space Secondary Dressing: Wound #1 Right,Medial Metatarsal head first: ABD pad Conform/Kerlix - conform to secure Wound #2 Left,Medial Lower Leg: ABD pad Conform/Kerlix - conform to secure Wound #5 Left Toe Third: ABD pad Conform/Kerlix - conform to secure Other - bandaid Dressing Change Frequency: Wound #1 Right,Medial  Metatarsal head first: Change Dressing Monday, Wednesday, Friday Wound #2 Left,Medial Lower Leg: Change Dressing Monday, Wednesday, Friday Wound #5 Left Toe Third: Change Dressing Monday, Wednesday, Friday Follow-up Appointments: Wound #1 Right,Medial Metatarsal head first: Return Appointment in 1 week. Wound #2 Left,Medial Lower Leg: Return Appointment in 1 week. Wound #5 Left Toe Third: Return Appointment in 1 week. Edema Control: Wound #1 Right,Medial Metatarsal head first: Other: - Tubi Grip single layer Wound #2 Left,Medial Lower Leg: Other: - Tubi Grip single layer Wound #5 Left Toe Third: Other: - Tubi Grip single layer Home Health: Wound #1 Right,Medial Metatarsal head first: [place] Visits - [place] Nurse may visit PRN  to address patient s wound care needs. Home Health Nurse may visit PRN to address patient s wound care needs. FACE TO FACE ENCOUNTER: MEDICARE and MEDICAID PATIENTS: I certify that this patient is under my care and that I had a face-to-face encounter that meets the physician face-to-face encounter requirements with this patient on this date. The encounter with the patient was in whole or in part for the following MEDICAL CONDITION: (primary reason for [place]) MEDICAL NECESSITY: I certify, that based on my findings, NURSING services are a medically necessary home health service. HOME BOUND STATUS: I certify that my clinical findings support that this patient is homebound (i.e., Due to illness or injury, pt requires aid of supportive devices such as crutches, cane, wheelchairs, walkers, the use of special [place], Liliya ([id number]) transportation or the assistance of another person to leave their place of residence. There is a normal inability to leave the home and doing so requires considerable and taxing effort. Other absences are for medical reasons / religious services and are infrequent or of short duration when for  other reasons). FACE TO FACE ENCOUNTER: MEDICARE and MEDICAID PATIENTS: I certify that this patient is under my care and that I had a face-to-face encounter that meets the physician face-to-face encounter requirements with this patient on this date. The encounter with the patient was in whole or in part for the following MEDICAL CONDITION: (primary reason for [place]) MEDICAL NECESSITY: I certify, that based on my findings, NURSING services are a medically necessary home health service. HOME BOUND STATUS: I certify that my clinical findings support that this patient is homebound (i.e., Due to illness or injury, pt requires aid of supportive devices such as crutches, cane, wheelchairs, walkers, the use of special transportation or the assistance of another person to leave their place of residence. There is a normal inability to leave the home and doing so requires considerable and taxing effort. Other absences are for medical reasons / religious services and are infrequent or of short duration when for other reasons). If current dressing causes regression in wound condition, may D/C ordered dressing product/s and apply Normal Saline Moist Dressing daily until next [place] / Other MD appointment. [place] of regression in wound condition at [phone]. If current dressing causes regression in wound condition, may D/C ordered dressing product/s and apply Normal Saline Moist Dressing daily until next [place] / Other MD appointment. [place] of regression in wound condition at [phone]. Please direct any NON-WOUND related issues/requests for orders to patient's Primary Care Physician Please direct any NON-WOUND related issues/requests for orders to patient's Primary Care Physician Wound #2 Left,Medial Lower Leg: [place] Nurse may visit PRN to address patient s wound care needs. Home  Health Nurse may visit PRN to address patient s wound care needs. FACE TO FACE ENCOUNTER: MEDICARE and MEDICAID PATIENTS: I certify that this patient is under my care and that I had a face-to-face encounter that meets the physician face-to-face encounter requirements with this patient on this date. The encounter with the patient was in whole or in part for the following MEDICAL CONDITION: (primary reason for [place]) MEDICAL NECESSITY: I certify, that based on my findings, NURSING services are a medically necessary home health service. HOME BOUND STATUS: I certify that my clinical findings support that this patient is homebound (i.e., Due to illness or injury, pt requires aid of supportive devices such as crutches,  cane, wheelchairs, walkers, the use of special transportation or the assistance of another person to leave their place of residence. There is a normal inability to leave the home and doing so requires considerable and taxing effort. Other absences are for medical reasons / religious services and are infrequent or of short duration when for other reasons). FACE TO FACE ENCOUNTER: MEDICARE and MEDICAID PATIENTS: I certify that this patient is under my care and that I had a face-to-face encounter that meets the physician face-to-face encounter requirements with this patient on this date. The encounter with the patient was in whole or in part for the following MEDICAL CONDITION: (primary reason for [place]) MEDICAL NECESSITY: I certify, that based on my findings, NURSING services are a medically necessary home health service. HOME BOUND STATUS: I certify that my clinical findings support that this patient is homebound (i.e., Due to illness or injury, pt requires aid of supportive devices such as crutches, cane, wheelchairs, walkers, the use of special transportation or the assistance of another person to leave their place of residence. There is a normal inability to leave the  home and doing so requires considerable and taxing effort. Other absences are for medical reasons / religious services and are infrequent or of short duration when for other reasons). If current dressing causes regression in wound condition, may D/C ordered dressing product/s and apply Normal Saline Moist Dressing daily until next [place] / Other MD appointment. [place] of regression in wound condition at [phone]. If current dressing causes regression in wound condition, may D/C ordered dressing product/s and apply Normal Saline Moist Dressing daily until next [place] / Other MD appointment. [place] of regression in wound condition at [phone]. Please direct any NON-WOUND related issues/requests for orders to patient's Primary Care Physician Please direct any NON-WOUND related issues/requests for orders to patient's Primary Care Physician Wound #5 Left Toe Third: [place] Nurse may visit PRN to address patient s wound care needs. Home Health Nurse may visit PRN to address patient s wound care needs. FACE TO FACE ENCOUNTER: MEDICARE and MEDICAID PATIENTS: I certify that this patient is under my care and that I had a face-to-face encounter that meets the physician face-to-face encounter requirements with this patient on this date. The encounter with the patient was in whole or in part for the following MEDICAL CONDITION: (primary reason for [place]) MEDICAL NECESSITY: I certify, that based on my findings, NURSING services are a medically necessary home health service. HOME BOUND STATUS: I certify that my clinical findings support that this patient is homebound (i.e., Due to illness or injury, pt requires aid of supportive devices such as crutches, cane, wheelchairs, walkers, the use of special transportation or the assistance of another person to leave their place of residence.  There is a normal inability to leave the home and doing so requires considerable and taxing effort. Other absences are for medical reasons / religious services and are infrequent or of short duration when for other reasons). FACE TO FACE ENCOUNTER: MEDICARE and MEDICAID PATIENTS: I certify that this patient is under my care and that I had a face-to-face encounter that meets the physician face-to-face encounter requirements with this patient on this date. The encounter with the patient was in whole or in part for the following MEDICAL CONDITION: (primary reason for [place]) MEDICAL NECESSITY: I certify, that based on my findings, NURSING services are a medically  necessary home health service. HOME BOUND STATUS: I certify that my clinical findings support that this patient is homebound (i.e., Due to illness or injury, pt requires aid of supportive devices such as crutches, cane, wheelchairs, walkers, the use of special transportation or the assistance of another person to leave their place of residence. There is a normal inability to leave the home and doing so requires considerable and taxing effort. Other absences are for medical reasons / religious services and are infrequent or of short duration when for other reasons). If current dressing causes regression in wound condition, may D/C ordered dressing product/s and apply Normal Saline Moist Dressing daily until next [place] / Other MD appointment. [place] of regression in wound condition at [phone]. If current dressing causes regression in wound condition, may D/C ordered dressing product/s and apply Normal Saline Moist Dressing daily until next [place] / Other MD appointment. [place] of regression in wound condition at [phone]. Please direct any NON-WOUND related issues/requests for orders to patient's Primary Care Physician Please direct any NON-WOUND related  issues/requests for orders to patient's Primary Care Physician Sandy Roberson, Sandy Roberson ([id number]) 1. I would recommend currently that we go ahead and continue with the collagen dressing to all wound locations. I do feel like she is doing well and I feel like she is making good progress here. I see no signs of infection or other complications at this point. 2. I would recommend as well that we discontinue dressings for the gluteal region that is no longer open I think we can still continue to use the cream on this area. 3. I am also going to suggest that we continue with appropriate offloading in order to prevent any additional and further breakdown at any wound site. We will see patient back for reevaluation in 1 week here in the clinic. If anything worsens or changes patient will contact our office for additional recommendations. Electronic Signature(s) Signed: 06/15/2019 4:04:58 PM By: Worthy Keeler PA-C Entered By: Worthy Keeler on 06/15/2019 16:04:58 Sandy Roberson ([id number]) -------------------------------------------------------------------------------- SuperBill Details Patient Name: Sandy Roberson Date of Service: 06/15/2019 Medical Record Number: [id number] Patient Account Number: [id number] Date of Birth/Sex: 06/11/1915 (86 y.o. F) Treating RN: Primary Care Provider: Tracie Harrier Other Clinician: Referring Provider: Tracie Harrier Treating Provider/Extender: Melburn Hake, Mattew Chriswell Weeks in Treatment: 11 Diagnosis Coding ICD-10 Codes Code Description I73.89 Other specified peripheral vascular diseases T81.31XA Disruption of external operation (surgical) wound, not elsewhere classified, initial encounter L97.513 Non-pressure chronic ulcer of other part of right foot with necrosis of muscle L89.312 Pressure ulcer of right buttock, stage 2 L97.821 Non-pressure chronic ulcer of other part of left lower leg limited to breakdown of skin L97.526 Non-pressure chronic ulcer  of other part of left foot with bone involvement without evidence of necrosis R13.12 Dysphagia, oropharyngeal phase N18.30 Chronic kidney disease, stage 3 unspecified I50.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 (congestive) heart failure Facility Procedures CPT4 Code: [id number] Description: 99214 - WOUND CARE VISIT-LEV 4 EST PT Modifier: Quantity: 1 Physician Procedures CPT4 CodeTP:[id number] Description: 99213 - WC PHYS LEVEL 3 - EST PT Modifier: Quantity: 1 CPT4 Code: Description: ICD-10 Diagnosis Description I73.89 Other specified peripheral vascular diseases T81.31XA Disruption of external operation (surgical) wound, not elsewhere classified, in L97.513 Non-pressure chronic ulcer of other part of right foot with  necrosis of muscle L89.312 Pressure ulcer of right buttock, stage 2 Modifier: itial encounter Quantity: Electronic Signature(s) Signed: 06/15/2019 4:15:22 PM By: Melburn Hake,  Margarita Grizzle PA-C Previous Signature: 06/15/2019 4:05:10 PM Version By: Worthy Keeler PA-C Entered By: Worthy Keeler on 06/15/2019 16:15:21

## 2019-06-19 NOTE — Progress Notes (Signed)
Sandy Roberson ([id number]) Visit Report for 06/05/2019 Arrival Information Details Patient Name: Sandy Roberson, Sandy Roberson Date of Service: 06/05/2019 3:30 PM Medical Record Number: [id number] Patient Account Number: [id number] Date of Birth/Sex: [birth year] (86 y.o. F) Treating RN: Sandy Roberson Primary Care Andreka Roberson: Sandy Roberson Other Clinician: Referring Sandy Roberson: Sandy Roberson Treating Sandy Roberson/Extender: Sandy Roberson, Sandy Roberson Weeks in Treatment: 9 Visit Information History Since Last Visit Added or deleted any medications: No Patient Arrived: Wheel Chair Any new allergies or adverse reactions: No Arrival Time: 15:21 Had a fall or experienced change in No Accompanied By: son activities of daily living that may affect Transfer Assistance: Manual risk of falls: Patient Identification Verified: Yes Signs or symptoms of abuse/neglect since last visito No Secondary Verification Process Completed: Yes Hospitalized since last visit: No Patient Has Alerts: Yes Implantable device outside of the clinic excluding No Patient Alerts: Patient on Blood Thinner cellular tissue based products placed in the center aspirin 81 since last visit: 03/10/19 ABI L .77 R .67 Has Dressing in Place as Prescribed: Yes TBI L .24 R .16 Pain Present Now: No Electronic Signature(s) Signed: 06/05/2019 4:27:22 PM By: Sandy Roberson RCP, RRT, CHT Entered By: Sandy Roberson on 06/05/2019 15:22:58 Sandy Roberson ([id number]) -------------------------------------------------------------------------------- Encounter Discharge Information Details Patient Name: Sandy Roberson Date of Service: 06/05/2019 3:30 PM Medical Record Number: [id number] Patient Account Number: [id number] Date of Birth/Sex: Feb 23, 1916 (86 y.o. F) Treating RN: Sandy Roberson Primary Care Vijay Durflinger: Sandy Roberson Other Clinician: Referring Sandy Roberson: Sandy Roberson Treating Sandy Roberson/Extender:  Sandy Roberson, Sandy Roberson Weeks in Treatment: 9 Encounter Discharge Information Items Post Procedure Vitals Discharge Condition: Stable Temperature (F): 98.4 Ambulatory Status: Ambulatory Pulse (bpm): 96 Discharge Destination: Home Respiratory Rate (breaths/min): 16 Transportation: Private Auto Blood Pressure (mmHg): 118/64 Accompanied By: self Schedule Follow-up Appointment: Yes Clinical Summary of Care: Electronic Signature(s) Signed: 06/05/2019 4:25:46 PM By: Sandy Roberson Entered By: Sandy Roberson on 06/05/2019 16:09:39 Sandy Roberson ([id number]) -------------------------------------------------------------------------------- Lower Extremity Assessment Details Patient Name: Sandy Roberson Date of Service: 06/05/2019 3:30 PM Medical Record Number: [id number] Patient Account Number: [id number] Date of Birth/Sex: 07-10-1915 (86 y.o. F) Treating RN: Sandy Roberson Primary Care Dimetrius Montfort: Sandy Roberson Other Clinician: Referring Rolfe Hartsell: Sandy Roberson Treating Tashonda Pinkus/Extender: Sandy Roberson Weeks in Treatment: 9 Vascular Assessment Pulses: Dorsalis Pedis Palpable: [Left:Yes] [Right:Yes] Electronic Signature(s) Signed: 06/19/2019 5:40:44 PM By: Sandy Roberson, BSN, RN, CWS, Kim RN, BSN Entered By: Sandy Roberson on 06/05/2019 15:43:38 Sandy Roberson ([id number]) -------------------------------------------------------------------------------- Multi Wound Chart Details Patient Name: Sandy Roberson Date of Service: 06/05/2019 3:30 PM Medical Record Number: [id number] Patient Account Number: [id number] Date of Birth/Sex: Mar 15, 1916 (86 y.o. F) Treating RN: Sandy Roberson Primary Care Hallelujah Wysong: Sandy Roberson Other Clinician: Referring Sandy Roberson: Sandy Roberson Treating Cannie Muckle/Extender: Sandy Roberson, Sandy Roberson Weeks in Treatment: 9 Vital Signs Height(in): 70 Pulse(bpm): 96 Weight(lbs): 118 Blood Pressure(mmHg): 118/64 Body Mass Index(BMI):  21 Temperature(F): 98.4 Respiratory Rate(breaths/min): 16 Photos: Wound Location: Right, Medial Metatarsal head first Left, Medial Lower Leg Right Gluteus Wounding Event: Gradually Appeared Blister Pressure Injury Primary Etiology: Arterial Insufficiency Ulcer Venous Leg Ulcer Pressure Ulcer Comorbid History: Anemia, Congestive Heart Failure, Anemia, Congestive Heart Failure, Anemia, Congestive Heart Failure, Coronary Artery Disease, Coronary Artery Disease, Coronary Artery Disease, Hypertension, Peripheral Arterial Hypertension, Peripheral Arterial Hypertension, Peripheral Arterial Disease, Peripheral Venous Disease, Peripheral Venous Disease, Peripheral Venous Disease, End Stage Renal Disease Disease, End Stage Renal Disease Disease, End Stage Renal Disease Date Acquired: 03/16/2018 04/18/2019 10/15/2018 Weeks of Treatment: 9 6 6  Wound Status: Open Open Open Pending Amputation on Yes No No  Presentation: Measurements L x W x D (cm) 3x2x1.1 3.4x3.5x0.1 0.1x0.1x0.1 Area (cm) : 4.712 9.346 0.008 Volume (cm) : 5.184 0.935 0.001 % Reduction in Area: 55.90% 7.00% 99.90% % Reduction in Volume: 75.70% 7.00% 99.80% Classification: Full Thickness With Exposed Partial Thickness Category/Stage II Support Structures Exudate Amount: Medium Large Medium Exudate Type: Serous Serous Serous Exudate Color: amber Physiological scientist Foul Odor After Cleansing: Yes No No Odor Anticipated Due to Product No N/A N/A Use: Wound Margin: Flat and Intact Flat and Intact Flat and Intact Granulation Amount: Medium (34-66%) Medium (34-66%) None Present (0%) Granulation Quality: Pink Pink N/A Necrotic Amount: Medium (34-66%) Medium (34-66%) None Present (0%) Exposed Structures: Fat Layer (Subcutaneous Tissue) Fat Layer (Subcutaneous Tissue) Fat Layer (Subcutaneous Tissue) Exposed: Yes Exposed: Yes Exposed: Yes Muscle: Yes Fascia: No Fascia: No Bone: Yes Tendon: No Tendon: No Fascia: No Muscle: No Muscle:  No Tendon: No Joint: No Joint: No Joint: No Bone: No Bone: No Epithelialization: None None Large (67-100%) Sandy Roberson ([id number]) Wound Number: 5 N/A N/A Photos: No Photos N/A N/A Wound Location: Left Toe Third N/A N/A Wounding Event: Gradually Appeared N/A N/A Primary Etiology: Arterial Insufficiency Ulcer N/A N/A Comorbid History: N/A N/A N/A Date Acquired: 04/17/2019 N/A N/A Weeks of Treatment: 4 N/A N/A Wound Status: Open N/A N/A Pending Amputation on No N/A N/A Presentation: Measurements L x W x D (cm) 0.5x1x0.1 N/A N/A Area (cm) : 0.393 N/A N/A Volume (cm) : 0.039 N/A N/A % Reduction in Area: 88.90% N/A N/A % Reduction in Volume: 89.00% N/A N/A Classification: Unclassifiable N/A N/A Exudate Amount: N/A N/A N/A Exudate Type: N/A N/A N/A Exudate Color: N/A N/A N/A Foul Odor After Cleansing: N/A N/A N/A Odor Anticipated Due to Product N/A N/A N/A Use: Wound Margin: N/A N/A N/A Granulation Amount: N/A N/A N/A Granulation Quality: N/A N/A N/A Necrotic Amount: N/A N/A N/A Exposed Structures: N/A N/A N/A Epithelialization: N/A N/A N/A Treatment Notes Electronic Signature(s) Signed: 06/05/2019 4:25:46 PM By: Sandy Roberson Entered By: Sandy Roberson on 06/05/2019 15:54:30 Sandy Roberson ([id number]) -------------------------------------------------------------------------------- Multi-Disciplinary Care Plan Details Patient Name: Sandy Roberson Date of Service: 06/05/2019 3:30 PM Medical Record Number: [id number] Patient Account Number: [id number] Date of Birth/Sex: [birth year] (86 y.o. F) Treating RN: Sandy Roberson Primary Care Murtaza Shell: Sandy Roberson Other Clinician: Referring Desmen Schoffstall: Sandy Roberson Treating Sami Froh/Extender: Sandy Roberson, Sandy Roberson Weeks in Treatment: 9 Active Inactive Venous Leg Ulcer Nursing Diagnoses: Knowledge deficit related to disease process and management Goals: Patient/caregiver will verbalize understanding of  disease process and disease management Date Initiated: 03/30/2019 Target Resolution Date: 03/30/2019 Goal Status: Active Interventions: Assess peripheral edema status every visit. Notes: Wound/Skin Impairment Nursing Diagnoses: Impaired tissue integrity Knowledge deficit related to ulceration/compromised skin integrity Goals: Ulcer/skin breakdown will have a volume reduction of 30% by week 4 Date Initiated: 03/30/2019 Target Resolution Date: 04/30/2019 Goal Status: Active Interventions: Assess patient/caregiver ability to obtain necessary supplies Assess patient/caregiver ability to perform ulcer/skin care regimen upon admission and as needed Assess ulceration(s) every visit Provide education on ulcer and skin care Notes: Electronic Signature(s) Signed: 06/05/2019 4:25:46 PM By: Sandy Roberson Entered By: Sandy Roberson on 06/05/2019 15:54:22 Sandy Roberson ([id number]) -------------------------------------------------------------------------------- Pain Assessment Details Patient Name: Sandy Roberson Date of Service: 06/05/2019 3:30 PM Medical Record Number: [id number] Patient Account Number: [id number] Date of Birth/Sex: 03-19-1916 (86 y.o. F) Treating RN: Sandy Roberson Primary Care Alesandro Stueve: Sandy Roberson Other Clinician: Referring Alonzo Loving: Sandy Roberson Treating Ailed Defibaugh/Extender: Sandy Roberson, Sandy Roberson Weeks in Treatment: 9 Active Problems Location of Pain Severity and Description  of Pain Patient Has Paino No Site Locations Pain Management and Medication Current Pain Management: Notes Patient denies pain at this time. Electronic Signature(s) Signed: 06/19/2019 5:40:44 PM By: Sandy Roberson, BSN, RN, CWS, Kim RN, BSN Entered By: Sandy Roberson on 06/05/2019 15:28:58 Sandy Roberson ([id number]) -------------------------------------------------------------------------------- Patient/Caregiver Education Details Patient Name: Sandy Roberson Date of  Service: 06/05/2019 3:30 PM Medical Record Number: [id number] Patient Account Number: [id number] Date of Birth/Gender: [birth year] (86 y.o. F) Treating RN: Sandy Roberson Primary Care Physician: Sandy Roberson Other Clinician: Referring Physician: Tracie Roberson Treating Physician/Extender: Sharalyn Ink in Treatment: 9 Education Assessment Education Provided To: Patient and Caregiver Education Topics Provided Wound/Skin Impairment: Handouts: Other: wound care as ordered Methods: Explain/Verbal Responses: State content correctly Electronic Signature(s) Signed: 06/05/2019 4:25:46 PM By: Sandy Roberson Entered By: Sandy Roberson on 06/05/2019 16:08:03 Sandy Roberson ([id number]) -------------------------------------------------------------------------------- Wound Assessment Details Patient Name: Sandy Roberson Date of Service: 06/05/2019 3:30 PM Medical Record Number: [id number] Patient Account Number: [id number] Date of Birth/Sex: December 16, 1915 (86 y.o. F) Treating RN: Sandy Roberson Primary Care Riyana Biel: Sandy Roberson Other Clinician: Referring Shawneequa Baldridge: Sandy Roberson Treating Chamika Cunanan/Extender: Sandy Roberson, Sandy Roberson Weeks in Treatment: 9 Wound Status Wound Number: 1 Primary Arterial Insufficiency Ulcer Etiology: Wound Location: Right, Medial Metatarsal head first Wound Open Wounding Event: Gradually Appeared Status: Date Acquired: 03/16/2018 Comorbid Anemia, Congestive Heart Failure, Coronary Artery Disease, Weeks Of Treatment: 9 History: Hypertension, Peripheral Arterial Disease, Peripheral Clustered Wound: No Venous Disease, End Stage Renal Disease Pending Amputation On Presentation Photos Wound Measurements Length: (cm) 3 Width: (cm) 2 Depth: (cm) 1.1 Area: (cm) 4.712 Volume: (cm) 5.184 % Reduction in Area: 55.9% % Reduction in Volume: 75.7% Epithelialization: None Tunneling: No Undermining: No Wound Description Classification: Full  Thickness With Exposed Support Structure Wound Margin: Flat and Intact Exudate Amount: Medium Exudate Type: Serous Exudate Color: amber s Foul Odor After Cleansing: Yes Due to Product Use: No Slough/Fibrino Yes Wound Bed Granulation Amount: Medium (34-66%) Exposed Structure Granulation Quality: Pink Fascia Exposed: No Necrotic Amount: Medium (34-66%) Fat Layer (Subcutaneous Tissue) Exposed: Yes Necrotic Quality: Adherent Slough Tendon Exposed: No Muscle Exposed: Yes Necrosis of Muscle: Yes Joint Exposed: No Bone Exposed: Yes Electronic Signature(s) Signed: 06/19/2019 5:40:44 PM By: Sandy Roberson, BSN, RN, CWS, Kim RN, BSN Entered By: Sandy Roberson on 06/05/2019 15:42:50 Sandy Roberson ([id number]) -------------------------------------------------------------------------------- Wound Assessment Details Patient Name: Sandy Roberson Date of Service: 06/05/2019 3:30 PM Medical Record Number: [id number] Patient Account Number: [id number] Date of Birth/Sex: 1916-01-10 (86 y.o. F) Treating RN: Sandy Roberson Primary Care Toby Breithaupt: Sandy Roberson Other Clinician: Referring Johsua Shevlin: Sandy Roberson Treating Amylia Collazos/Extender: Sandy Roberson, Sandy Roberson Weeks in Treatment: 9 Wound Status Wound Number: 2 Primary Venous Leg Ulcer Etiology: Wound Location: Left, Medial Lower Leg Wound Open Wounding Event: Blister Status: Date Acquired: 04/18/2019 Comorbid Anemia, Congestive Heart Failure, Coronary Artery Disease, Weeks Of Treatment: 6 History: Hypertension, Peripheral Arterial Disease, Peripheral Venous Clustered Wound: No Disease, End Stage Renal Disease Photos Wound Measurements Length: (cm) 3.4 Width: (cm) 3.5 Depth: (cm) 0.1 Area: (cm) 9.346 Volume: (cm) 0.935 % Reduction in Area: 7% % Reduction in Volume: 7% Epithelialization: None Tunneling: No Undermining: No Wound Description Classification: Partial Thickness Wound Margin: Flat and Intact Exudate Amount:  Large Exudate Type: Serous Exudate Color: amber Foul Odor After Cleansing: No Slough/Fibrino No Wound Bed Granulation Amount: Medium (34-66%) Exposed Structure Granulation Quality: Pink Fascia Exposed: No Necrotic Amount: Medium (34-66%) Fat Layer (Subcutaneous Tissue) Exposed: Yes Necrotic Quality: Adherent Slough Tendon Exposed: No Muscle Exposed: No  Joint Exposed: No Bone Exposed: No Electronic Signature(s) Signed: 06/19/2019 5:40:44 PM By: Sandy Roberson, BSN, RN, CWS, Kim RN, BSN Entered By: Sandy Roberson on 06/05/2019 15:42:51 Sandy Roberson ([id number]) -------------------------------------------------------------------------------- Wound Assessment Details Patient Name: Sandy Roberson Date of Service: 06/05/2019 3:30 PM Medical Record Number: [id number] Patient Account Number: [id number] Date of Birth/Sex: [birth year] (86 y.o. F) Treating RN: Sandy Roberson Primary Care Donell Tomkins: Sandy Roberson Other Clinician: Referring Kiffany Schelling: Sandy Roberson Treating Akil Hoos/Extender: Sandy Roberson, Sandy Roberson Weeks in Treatment: 9 Wound Status Wound Number: 4 Primary Pressure Ulcer Etiology: Wound Location: Right Gluteus Wound Open Wounding Event: Pressure Injury Status: Date Acquired: 10/15/2018 Comorbid Anemia, Congestive Heart Failure, Coronary Artery Disease, Weeks Of Treatment: 6 History: Hypertension, Peripheral Arterial Disease, Peripheral Venous Clustered Wound: No Disease, End Stage Renal Disease Photos Wound Measurements Length: (cm) 0.1 Width: (cm) 0.1 Depth: (cm) 0.1 Area: (cm) 0.008 Volume: (cm) 0.001 % Reduction in Area: 99.9% % Reduction in Volume: 99.8% Epithelialization: Large (67-100%) Tunneling: No Undermining: No Wound Description Classification: Category/Stage II Wound Margin: Flat and Intact Exudate Amount: Medium Exudate Type: Serous Exudate Color: amber Foul Odor After Cleansing: No Slough/Fibrino Yes Wound Bed Granulation Amount:  None Present (0%) Exposed Structure Necrotic Amount: None Present (0%) Fascia Exposed: No Fat Layer (Subcutaneous Tissue) Exposed: Yes Tendon Exposed: No Muscle Exposed: No Joint Exposed: No Bone Exposed: No Electronic Signature(s) Signed: 06/19/2019 5:40:44 PM By: Sandy Roberson, BSN, RN, CWS, Kim RN, BSN Entered By: Sandy Roberson on 06/05/2019 15:42:51 Sandy Roberson ([id number]) -------------------------------------------------------------------------------- Wound Assessment Details Patient Name: Sandy Roberson Date of Service: 06/05/2019 3:30 PM Medical Record Number: [id number] Patient Account Number: [id number] Date of Birth/Sex: [birth year] (86 y.o. F) Treating RN: Sandy Roberson Primary Care Ajai Terhaar: Sandy Roberson Other Clinician: Referring Janely Gullickson: Sandy Roberson Treating Jaceion Aday/Extender: Sandy Roberson, Sandy Roberson Weeks in Treatment: 9 Wound Status Wound Number: 5 Primary Etiology: Arterial Insufficiency Ulcer Wound Location: Left Toe Third Wound Status: Open Wounding Event: Gradually Appeared Date Acquired: 04/17/2019 Weeks Of Treatment: 4 Clustered Wound: No Photos Photo Uploaded By: Sandy Roberson on 06/05/2019 16:01:50 Wound Measurements Length: (cm) 0.5 Width: (cm) 1 Depth: (cm) 0.1 Area: (cm) 0.393 Volume: (cm) 0.039 % Reduction in Area: 88.9% % Reduction in Volume: 89% Wound Description Classification: Unclassifiable Electronic Signature(s) Signed: 06/19/2019 5:40:44 PM By: Sandy Roberson, BSN, RN, CWS, Kim RN, BSN Entered By: Sandy Roberson on 06/05/2019 15:42:52 Sandy Roberson ([id number]) -------------------------------------------------------------------------------- Vitals Details Patient Name: Sandy Roberson Date of Service: 06/05/2019 3:30 PM Medical Record Number: [id number] Patient Account Number: [id number] Date of Birth/Sex: Dec 27, 1915 (86 y.o. F) Treating RN: Sandy Roberson Primary Care Luvina Poirier: Sandy Roberson  Other Clinician: Referring Lucendia Leard: Sandy Roberson Treating Serenitie Vinton/Extender: Sandy Roberson, Sandy Roberson Weeks in Treatment: 9 Vital Signs Time Taken: 15:23 Temperature (F): 98.4 Height (in): 63 Pulse (bpm): 96 Weight (lbs): 118 Respiratory Rate (breaths/min): 16 Body Mass Index (BMI): 20.9 Blood Pressure (mmHg): 118/64 Reference Range: 80 - 120 mg / dl Electronic Signature(s) Signed: 06/05/2019 4:27:22 PM By: Sandy Roberson RCP, RRT, CHT Entered By: Sandy Roberson on 06/05/2019 15:25:01

## 2019-06-22 ENCOUNTER — Ambulatory Visit: Payer: Medicare Other | Admitting: Physician Assistant

## 2019-06-26 ENCOUNTER — Other Ambulatory Visit: Payer: Self-pay | Admitting: Physician Assistant

## 2019-06-26 ENCOUNTER — Other Ambulatory Visit: Payer: Self-pay

## 2019-06-26 ENCOUNTER — Ambulatory Visit
Admission: RE | Admit: 2019-06-26 | Discharge: 2019-06-26 | Disposition: A | Discharge status: Home / Self Care | Payer: Medicare Other | Source: Ambulatory Visit | Attending: Physician Assistant | Admitting: Physician Assistant

## 2019-06-26 ENCOUNTER — Other Ambulatory Visit
Admission: RE | Admit: 2019-06-26 | Discharge: 2019-06-26 | Disposition: A | Discharge status: Home / Self Care | Payer: Medicare Other | Source: Ambulatory Visit | Attending: Physician Assistant | Admitting: Physician Assistant

## 2019-06-26 ENCOUNTER — Encounter: Payer: Medicare Other | Admitting: Physician Assistant

## 2019-06-26 DIAGNOSIS — L02619 Cutaneous abscess of unspecified foot: Secondary | ICD-10-CM

## 2019-06-26 DIAGNOSIS — I7389 Other specified peripheral vascular diseases: Secondary | ICD-10-CM | POA: Diagnosis not present

## 2019-06-26 DIAGNOSIS — B999 Unspecified infectious disease: Secondary | ICD-10-CM | POA: Insufficient documentation

## 2019-06-26 NOTE — Progress Notes (Addendum)
Sandy Roberson ([id number]) Visit Report for 06/26/2019 Arrival Information Details Patient Name: Sandy Roberson, Sandy Roberson Date of Service: 06/26/2019 10:15 AM Medical Record Number: [id number] Patient Account Number: [id number] Date of Birth/Sex: Sep 01, 1915 (86 y.o. F) Treating RN: Montey Hora Primary Care Emillio Ngo: Tracie Harrier Other Clinician: Referring Machelle Raybon: Tracie Harrier Treating Maciah Feeback/Extender: Melburn Hake, HOYT Weeks in Treatment: 12 Visit Information History Since Last Visit Added or deleted any medications: No Patient Arrived: Wheel Chair Any new allergies or adverse reactions: No Arrival Time: 10:25 Had a fall or experienced change in No Accompanied By: son activities of daily living that may affect Transfer Assistance: Manual risk of falls: Patient Identification Verified: Yes Signs or symptoms of abuse/neglect since last visito No Secondary Verification Process Completed: Yes Hospitalized since last visit: No Patient Has Alerts: Yes Implantable device outside of the clinic excluding No Patient Alerts: Patient on Blood Thinner cellular tissue based products placed in the center aspirin 81 since last visit: 03/10/19 ABI L .77 R .67 Has Dressing in Place as Prescribed: Yes TBI L .24 R .16 Pain Present Now: No Electronic Signature(s) Signed: 06/26/2019 4:02:09 PM By: Lorine Bears RCP, RRT, CHT Entered By: Lorine Bears on 06/26/2019 10:26:08 Sandy Roberson ([id number]) -------------------------------------------------------------------------------- Lower Extremity Assessment Details Patient Name: Sandy Roberson Date of Service: 06/26/2019 10:15 AM Medical Record Number: [id number] Patient Account Number: [id number] Date of Birth/Sex: August 13, 1915 (86 y.o. F) Treating RN: Army Melia Primary Care Daymien Goth: Tracie Harrier Other Clinician: Referring Tashi Band: Tracie Harrier Treating Herberto Ledwell/Extender: STONE  III, HOYT Weeks in Treatment: 12 Edema Assessment Assessed: [Left: No] [Right: No] Edema: [Left: No] [Right: No] Vascular Assessment Pulses: Dorsalis Pedis Palpable: [Left:Yes] [Right:Yes] Electronic Signature(s) Signed: 06/26/2019 10:41:25 AM By: Army Melia Entered By: Army Melia on 06/26/2019 10:36:58 Sandy Roberson ([id number]) -------------------------------------------------------------------------------- Multi Wound Chart Details Patient Name: Sandy Roberson Date of Service: 06/26/2019 10:15 AM Medical Record Number: [id number] Patient Account Number: [id number] Date of Birth/Sex: [birth year] (86 y.o. 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 Comorbid History: Anemia, Congestive Heart Failure, Anemia, Congestive Heart Failure, Anemia, Congestive Heart Failure, Coronary Artery Disease, Coronary Artery Disease, Coronary Artery Disease, Hypertension, Peripheral Arterial Hypertension, Peripheral Arterial Hypertension, Peripheral Arterial Disease, Peripheral Venous Disease, Disease, Peripheral Venous Disease, Disease, Peripheral Venous Disease, End Stage Renal Disease End Stage Renal Disease End Stage Renal Disease Date Acquired: 03/16/2018 04/18/2019 04/17/2019 Weeks of Treatment: 12 9 7  Wound Status: Open Open Open Pending Amputation on Yes No No Presentation: Measurements L x W x D (cm)  1.5x1.3x0.5 2x1.5x0.1 0.4x0.5x0.1 Area (cm) : 1.532 2.356 0.157 Volume (cm) : 0.766 0.236 0.016 % Reduction in Area: 85.70% 76.60% 95.60% % Reduction in Volume: 96.40% 76.50% 95.50% Classification: Full Thickness With Exposed Partial Thickness Full Thickness Without Exposed Support Structures Support Structures Exudate Amount: Medium Large Medium Exudate Type: Serous Serous Serosanguineous Exudate Color: amber amber red, brown Foul Odor After Cleansing: Yes No No Odor Anticipated Due to Product No N/A N/A Use: Wound Margin: Flat and Intact Flat and Intact N/A Granulation Amount: Large (67-100%) Medium (34-66%) Medium (34-66%) Granulation Quality: Pink Pink Red Necrotic Amount: Small (  1-33%) Medium (34-66%) Medium (34-66%) Necrotic Tissue: Adherent [place] Exposed Structures: Fat Layer (Subcutaneous Tissue) Fat Layer (Subcutaneous Tissue) Fat Layer (Subcutaneous Tissue) Exposed: Yes Exposed: Yes Exposed: Yes Muscle: Yes Fascia: No Fascia: No Bone: Yes Tendon: No Tendon: No Fascia: No Muscle: No Muscle: No Tendon: No Joint: No Joint: No Joint: No Bone: No Bone: No Epithelialization: None None None Wound Number: 6 N/A N/A Photos: N/A N/A PARA, COSSEY ([id number]) Wound Location: Left, Plantar Metatarsal head first N/A N/A Wounding Event: Gradually Appeared N/A N/A Primary Etiology: Pressure Ulcer N/A N/A Comorbid History: Anemia, Congestive Heart Failure, N/A N/A Coronary Artery Disease, Hypertension, Peripheral Arterial Disease, Peripheral Venous Disease, End Stage Renal Disease Date Acquired: 06/18/2019 N/A N/A Weeks of Treatment: 0 N/A N/A Wound Status: Open N/A N/A Pending Amputation on No N/A N/A Presentation: Measurements L x W x D (cm) 0.5x1x0.1 N/A N/A Area (cm) : 0.393 N/A N/A Volume (cm) : 0.039 N/A N/A % Reduction in Area: 0.00% N/A N/A % Reduction in Volume: 0.00% N/A N/A Classification: Category/Stage II N/A  N/A Exudate Amount: Medium N/A N/A Exudate Type: Serosanguineous N/A N/A Exudate Color: red, brown N/A N/A Foul Odor After Cleansing: No N/A N/A Odor Anticipated Due to Product N/A N/A N/A Use: Wound Margin: N/A N/A N/A Granulation Amount: Small (1-33%) N/A N/A Granulation Quality: Pink N/A N/A Necrotic Amount: Large (67-100%) N/A N/A Necrotic Tissue: Eschar, Adherent Slough N/A N/A Exposed Structures: Fat Layer (Subcutaneous Tissue) N/A N/A Exposed: Yes Fascia: No Tendon: No Muscle: No Joint: No Bone: No Epithelialization: None N/A N/A Treatment Notes Electronic Signature(s) Signed: 06/26/2019 4:04:12 PM By: Montey Hora Entered By: Montey Hora on 06/26/2019 10:57:02 Sandy Roberson ([id number]) -------------------------------------------------------------------------------- Multi-Disciplinary Care Plan Details Patient Name: Sandy Roberson Date of Service: 06/26/2019 10:15 AM Medical Record Number: [id number] Patient Account Number: [id number] Date of Birth/Sex: July 16, 1915 (86 y.o. F) Treating RN: Montey Hora Primary Care Bani Gianfrancesco: Tracie Harrier Other Clinician: Referring Olan Kurek: Tracie Harrier Treating Audreanna Torrisi/Extender: Worthy Keeler Weeks in Treatment: 12 Active Inactive Electronic Signature(s) Signed: 07/20/2019 2:20:17 PM By: Gretta Cool, BSN, RN, CWS, Kim RN, BSN Signed: 10/09/2019 10:50:41 AM By: Montey Hora Previous Signature: 06/26/2019 4:04:12 PM Version By: Montey Hora Entered By: Gretta Cool BSN, RN, CWS, Kim on 07/20/2019 14:20:16 Sandy Roberson ([id number]) -------------------------------------------------------------------------------- Pain Assessment Details Patient Name: Sandy Roberson Date of Service: 06/26/2019 10:15 AM Medical Record Number: [id number] Patient Account Number: [id number] Date of Birth/Sex: March 08, 1916 (86 y.o. F) Treating RN: Army Melia Primary Care Zamirah Denny: Tracie Harrier Other Clinician: Referring  Norlene Lanes: Tracie Harrier Treating Crystel Demarco/Extender: Melburn Hake, HOYT Weeks in Treatment: 12 Active Problems Location of Pain Severity and Description of Pain Patient Has Paino No Site Locations Pain Management and Medication Current Pain Management: Electronic Signature(s) Signed: 06/26/2019 10:41:25 AM By: Army Melia Entered By: Army Melia on 06/26/2019 10:32:00 Sandy Roberson ([id number]) -------------------------------------------------------------------------------- Patient/Caregiver Education Details Patient Name: Sandy Roberson Date of Service: 06/26/2019 10:15 AM Medical Record Number: [id number] Patient Account Number: [id number] Date of Birth/Gender: 1915-09-21 (86 y.o. F) Treating RN: Montey Hora Primary Care Physician: Tracie Harrier Other Clinician: Referring Physician: Tracie Harrier Treating Physician/Extender: Sharalyn Ink in Treatment: 12 Education Assessment Education Provided To: Patient and Caregiver Education Topics Provided Wound/Skin Impairment: Handouts: Other: wound care as ordered Methods: Demonstration, Explain/Verbal Responses: State content correctly Electronic Signature(s) Signed: 06/26/2019 4:04:12 PM By: Montey Hora Entered By: Montey Hora on 06/26/2019 11:24:30 Sandy Roberson ([id number]) -------------------------------------------------------------------------------- Wound Assessment Details Patient Name: Sandy Roberson Date of Service: 06/26/2019 10:15 AM Medical  Record Number: [id number] Patient Account Number: [id number] Date of Birth/Sex: January 03, 1916 (86 y.o. F) Treating RN: Army Melia Primary Care Jermiyah Ricotta: Tracie Harrier Other Clinician: Referring Hartlee Amedee: Tracie Harrier Treating Mickenzie Stolar/Extender: Melburn Hake, HOYT Weeks in Treatment: 12 Wound Status Wound Number: 1 Primary Arterial Insufficiency Ulcer Etiology: Wound Location: Right Metatarsal head first - Medial Wound  Open Wounding Event: Gradually Appeared Status: Date Acquired: 03/16/2018 Comorbid Anemia, Congestive Heart Failure, Coronary Artery Weeks Of Treatment: 12 History: Disease, Hypertension, Peripheral Arterial Disease, Clustered Wound: No Peripheral Venous Disease, End Stage Renal Disease Pending Amputation On Presentation Photos Wound Measurements Length: (cm) 1.5 Width: (cm) 1.3 Depth: (cm) 0.5 Area: (cm) 1.532 Volume: (cm) 0.766 % Reduction in Area: 85.7% % Reduction in Volume: 96.4% Epithelialization: None Wound Description Classification: Full Thickness With Exposed Support Structures Wound Margin: Flat and Intact Exudate Amount: Medium Exudate Type: Serous Exudate Color: amber Foul Odor After Cleansing: Yes Due to Product Use: No Slough/Fibrino Yes Wound Bed Granulation Amount: Large (67-100%) Exposed Structure Granulation Quality: Pink Fascia Exposed: No Necrotic Amount: Small (1-33%) Fat Layer (Subcutaneous Tissue) Exposed: Yes Necrotic Quality: Adherent Slough Tendon Exposed: No Muscle Exposed: Yes Necrosis of Muscle: Yes Joint Exposed: No Bone Exposed: Yes Electronic Signature(s) Signed: 06/26/2019 10:41:25 AM By: Army Melia Entered By: Army Melia on 06/26/2019 10:35:39 Sandy Roberson ([id number]) -------------------------------------------------------------------------------- Wound Assessment Details Patient Name: Sandy Roberson Date of Service: 06/26/2019 10:15 AM Medical Record Number: [id number] Patient Account Number: [id number] Date of Birth/Sex: [birth year] (86 y.o. F) Treating RN: Army Melia Primary Care Wilho Sharpley: Tracie Harrier Other Clinician: Referring Shelli Portilla: Tracie Harrier Treating Nedim Oki/Extender: Melburn Hake, HOYT Weeks in Treatment: 12 Wound Status Wound Number: 2 Primary Venous Leg Ulcer Etiology: Wound Location: Left Lower Leg - Medial Wound Open Wounding Event: Blister Status: Date Acquired:  04/18/2019 Comorbid Anemia, Congestive Heart Failure, Coronary Artery Weeks Of Treatment: 9 History: Disease, Hypertension, Peripheral Arterial Disease, Clustered Wound: No Peripheral Venous Disease, End Stage Renal Disease Photos Wound Measurements Length: (cm) 2 Width: (cm) 1.5 Depth: (cm) 0.1 Area: (cm) 2.356 Volume: (cm) 0.236 % Reduction in Area: 76.6% % Reduction in Volume: 76.5% Epithelialization: None Wound Description Classification: Partial Thickness Wound Margin: Flat and Intact Exudate Amount: Large Exudate Type: Serous Exudate Color: amber Foul Odor After Cleansing: No Slough/Fibrino No Wound Bed Granulation Amount: Medium (34-66%) Exposed Structure Granulation Quality: Pink Fascia Exposed: No Necrotic Amount: Medium (34-66%) Fat Layer (Subcutaneous Tissue) Exposed: Yes Necrotic Quality: Adherent Slough Tendon Exposed: No Muscle Exposed: No Joint Exposed: No Bone Exposed: No Electronic Signature(s) Signed: 06/26/2019 10:41:25 AM By: Army Melia Entered By: Army Melia on 06/26/2019 10:36:02 Sandy Roberson ([id number]) -------------------------------------------------------------------------------- Wound Assessment Details Patient Name: Sandy Roberson Date of Service: 06/26/2019 10:15 AM Medical Record Number: [id number] Patient Account Number: [id number] Date of Birth/Sex: [birth year] (86 y.o. F) Treating RN: Army Melia Primary Care Cleophas Yoak: Tracie Harrier Other Clinician: Referring Rashelle Ireland: Tracie Harrier Treating Nevaan Bunton/Extender: Melburn Hake, HOYT Weeks in Treatment: 12 Wound Status Wound Number: 5 Primary Arterial Insufficiency Ulcer Etiology: Wound Location: Left Toe Third Wound Open Wounding Event: Gradually Appeared Status: Date Acquired: 04/17/2019 Comorbid Anemia, Congestive Heart Failure, Coronary Artery Weeks Of Treatment: 7 History: Disease, Hypertension, Peripheral Arterial Disease, Clustered Wound: No Peripheral  Venous Disease, End Stage Renal Disease Photos Wound Measurements Length: (cm) 0.4 Width: (cm) 0.5 Depth: (cm) 0.1 Area: (cm) 0.157 Volume: (cm) 0.016 % Reduction in Area: 95.6% % Reduction in Volume: 95.5% Epithelialization: None Wound Description Classification: Full Thickness Without Exposed Support Structu Exudate Amount: Medium Exudate Type: Serosanguineous Exudate  Color: red, brown res Foul Odor After Cleansing: No Slough/Fibrino Yes Wound Bed Granulation Amount: Medium (34-66%) Exposed Structure Granulation Quality: Red Fascia Exposed: No Necrotic Amount: Medium (34-66%) Fat Layer (Subcutaneous Tissue) Exposed: Yes Necrotic Quality: Adherent Slough Tendon Exposed: No Muscle Exposed: No Joint Exposed: No Bone Exposed: No Electronic Signature(s) Signed: 06/26/2019 10:41:25 AM By: Army Melia Entered By: Army Melia on 06/26/2019 10:36:36 Sandy Roberson ([id number]) -------------------------------------------------------------------------------- Wound Assessment Details Patient Name: Sandy Roberson Date of Service: 06/26/2019 10:15 AM Medical Record Number: [id number] Patient Account Number: [id number] Date of Birth/Sex: 1916-03-07 (86 y.o. F) Treating RN: Army Melia Primary Care Weston Kallman: Tracie Harrier Other Clinician: Referring Krissie Merrick: Tracie Harrier Treating Sariyah Corcino/Extender: Melburn Hake, HOYT Weeks in Treatment: 12 Wound Status Wound Number: 6 Primary Pressure Ulcer Etiology: Wound Location: Left, Plantar Metatarsal head first Wound Open Wounding Event: Gradually Appeared Status: Date Acquired: 06/18/2019 Comorbid Anemia, Congestive Heart Failure, Coronary Artery Weeks Of Treatment: 0 History: Disease, Hypertension, Peripheral Arterial Disease, Clustered Wound: No Peripheral Venous Disease, End Stage Renal Disease Photos Wound Measurements Length: (cm) 0.5 Width: (cm) 1 Depth: (cm) 0.1 Area: (cm) 0.393 Volume: (cm) 0.039 %  Reduction in Area: 0% % Reduction in Volume: 0% Epithelialization: None Tunneling: No Undermining: No Wound Description Classification: Category/Stage II Exudate Amount: Medium Exudate Type: Serosanguineous Exudate Color: red, brown Foul Odor After Cleansing: No Slough/Fibrino Yes Wound Bed Granulation Amount: Small (1-33%) Exposed Structure Granulation Quality: Pink Fascia Exposed: No Necrotic Amount: Large (67-100%) Fat Layer (Subcutaneous Tissue) Exposed: Yes Necrotic Quality: Eschar, Adherent Slough Tendon Exposed: No Muscle Exposed: No Joint Exposed: No Bone Exposed: No Electronic Signature(s) Signed: 06/26/2019 10:41:25 AM By: Army Melia Entered By: Army Melia on 06/26/2019 10:35:11 Sandy Roberson ([id number]) -------------------------------------------------------------------------------- Vitals Details Patient Name: Sandy Roberson Date of Service: 06/26/2019 10:15 AM Medical Record Number: [id number] Patient Account Number: [id number] Date of Birth/Sex: [birth year] (86 y.o. F) Treating RN: Montey Hora Primary Care Mykai Wendorf: Tracie Harrier Other Clinician: Referring Emett Stapel: Tracie Harrier Treating Niyanna Asch/Extender: Melburn Hake, HOYT Weeks in Treatment: 12 Vital Signs Time Taken: 10:25 Temperature (F): 97.7 Height (in): 63 Pulse (bpm): 97 Weight (lbs): 118 Respiratory Rate (breaths/min): 16 Body Mass Index (BMI): 20.9 Blood Pressure (mmHg): 126/62 Reference Range: 80 - 120 mg / dl Electronic Signature(s) Signed: 06/26/2019 4:02:09 PM By: Lorine Bears RCP, RRT, CHT Entered By: Lorine Bears on 06/26/2019 10:27:19

## 2019-06-26 NOTE — Progress Notes (Addendum)
ALONDRA, VANDEVEN ([id number]) Visit Report for 06/26/2019 Chief Complaint Document Details Patient Name: Sandy Roberson, Sandy Roberson Date of Service: 06/26/2019 10:15 AM Medical Record Number: [id number] Patient Account Number: [id number] Date of Birth/Sex: 10-Mar-1916 (86 y.o. F) Treating RN: Montey Hora Primary Care Provider: Tracie Harrier Other Clinician: Referring Provider: Tracie Harrier Treating Provider/Extender: Melburn Hake, Forrester Blando Weeks in Treatment: 12 Information Obtained from: Patient Chief Complaint Right medial foot surgical ulcer, Left LE ulcers, and right gluteal ulcer Electronic Signature(s) Signed: 06/26/2019 10:21:38 AM By: Worthy Keeler PA-C Entered By: Worthy Keeler on 06/26/2019 10:21:37 Sandy Roberson ([id number]) -------------------------------------------------------------------------------- Debridement Details Patient Name: Sandy Roberson Date of Service: 06/26/2019 10:15 AM Medical Record Number: [id number] Patient Account Number: [id number] Date of Birth/Sex: March 21, 1916 (86 y.o. F) Treating RN: Montey Hora Primary Care Provider: Tracie Harrier Other Clinician: Referring Provider: Tracie Harrier Treating Provider/Extender: Melburn Hake, Andraya Frigon Weeks in Treatment: 12 Debridement Performed for Wound #6 Left,Plantar Metatarsal head first Assessment: Performed By: Physician STONE III, Ajit Errico E., PA-C Debridement Type: Debridement Level of Consciousness (Pre- Awake and Alert procedure): Pre-procedure Verification/Time Out Yes - 10:59 Taken: Start Time: 10:59 Pain Control: Lidocaine 4% Topical Solution Total Area Debrided (L x W): 4 (cm) x 4 (cm) = 16 (cm) Tissue and other material debrided: Non-Viable, Callus, Skin: Dermis , Skin: Epidermis Level: Skin/Epidermis Debridement Description: Selective/Open Wound Instrument: Forceps, Scissors Specimen: Swab, Number of Specimens Taken: 1 Bleeding: Minimum Hemostasis Achieved: Pressure End  Time: 11:05 Procedural Pain: 0 Post Procedural Pain: 0 Response to Treatment: Procedure was tolerated well Level of Consciousness (Post- Awake and Alert procedure): Post Debridement Measurements of Total Wound Length: (cm) 0.5 Stage: Category/Stage II Width: (cm) 1 Depth: (cm) 0.8 Volume: (cm) 0.314 Character of Wound/Ulcer Post Debridement: Improved Post Procedure Diagnosis Same as Pre-procedure Notes circumfrential undermining at 1.4cm Electronic Signature(s) Signed: 06/26/2019 4:04:12 PM By: Montey Hora Signed: 06/26/2019 4:19:26 PM By: Worthy Keeler PA-C Entered By: Montey Hora on 06/26/2019 11:07:12 Sandy Roberson ([id number]) -------------------------------------------------------------------------------- HPI Details Patient Name: Sandy Roberson Date of Service: 06/26/2019 10:15 AM Medical Record Number: [id number] Patient Account Number: [id number] Date of Birth/Sex: 24-May-1915 (86 y.o. F) Treating RN: Montey Hora Primary Care Provider: Tracie Harrier Other Clinician: Referring Provider: Tracie Harrier Treating Provider/Extender: Melburn Hake, Sidi Dzikowski Weeks in Treatment: 12 History of Present Illness HPI Description: 03/30/2019 on evaluation today patient presents for initial evaluation here in our clinic as a referral from Dr. Selina Cooley office who is a local podiatrist. Unfortunately the patient has experienced significant issues with decreased ability to heal in regard to a surgical site and again this is a fairly complicated history. The patient has undergone 3 vascular interventions the most recent of which was actually performed on 12//20. This was subsequent to the patient having ABIs which were somewhat poor registering at 0.67 on the right with a TBI of 0.16 and on the left she was 0.77 with a TBI of 0.24. Subsequently the patient had had surgery to actually remove a portion of the first metatarsal on her right foot. They did not remove the  toe although again from the patient's standpoint I am not 100% sure exactly the reasoning behind what was done or was not done but again I think a big part of the issue was trying to be as minimally invasive for the patient as possible do #1 to her arterial status #2 due to her age. Nonetheless unfortunately she has had some trouble with healing and when the sutures were removed the wound apparently dehisced. The patient does  currently have more recent vascular intervention which again was on December 4 with Dr. Lazaro Arms and at this point the patient did have angioplasty which upon completion showed inline flow into the foot with less than 20% residual stenosis after angioplasties. There were multiple sites which had to be worked on during the procedure by Dr. Lucky Cowboy. Again the patient has very problematic blood flow with regard to her arterial status at this point. She does have a history of dysphagia, stage III kidney disease, and congestive heart failure as well. 04/06/2019 on evaluation today patient appears to be doing in my opinion about the same there may be slightly better with regard to the overall. With that being said she still is healing slowly obviously has only been 1 week and now the wounds do appear to be clean enough I still think there is can be quite a bit of work to do going forward before will be where we really need to be here. She does have an appointment with vascular on Wednesday which is just 2 days away for repeat vascular studies along with evaluation with the physician as well. This will be good information for [place] to know going forward as far as her arterial status and what we can or cannot do from the standpoint of debridement or otherwise. 12/28-Patient returns at 1 week with the right medial metatarsal wound, this appears to be better with the rim of granulation tissue noted. We have been using Iodoflex. Patient had vascular studies done that indicate good flow to this foot, the  left foot has ABI of 0.59 and a TBI 0.34 and patient has vascular follow-up for revascularization efforts. 04/20/2019 on evaluation today patient's right first metatarsal ulcer actually appears to be doing about the same is making slow progress. With that being said she unfortunately has blisters over the left lower extremity, left medial ankle, and she still has apparently a pressure ulcer in the gluteal region which is in the right gluteus and is completely new to [place]. This is something that was not known of previous. In fact they did not even tell us about it today until at the end of the visit when we were getting ready to go and her son mention this. 04/27/2019 upon evaluation today patient appears to be doing excellent with regard to her wounds all things considered. I do feel like she is making some progress here she did have her vascular intervention/angiogram which was on 04/23/2019. With that being said I do not have the records for review today as the note is not actually in yet with looking in the system. Nonetheless Dr. Lucky Cowboy did perform this procedure. The patient has had some increased swelling since the procedure in the left lower extremity but this is to be expected. Obviously that will cause the wounds to drain more. Nonetheless overall I do not see any signs of active infection. Her right foot ulcer seems to be making progress albeit slow. 05/08/2019 on evaluation today patient appears to be doing well with regard to her wounds in general. I feel like that other than the fact that her toe ulcer on the left third toe worsened from where she had just a small blood blister to where it actually developed into more of a wound. This is something that now hopefully should reverse now that she has had the intervention on the left side and hopefully is better able to heal some of these wound areas in fact I really feel like a  lot is drying up and appearing much better than what it was previous.  Fortunately there is no signs of active infection at this time. No fevers, chills, nausea, vomiting, or diarrhea. 05/15/2019 on evaluation today patient appears to be doing okay in general with regard to her wounds. The wound in the first metatarsal region on the right seems to be doing quite well. I see definite improvement. The lower extremity ulcers both appear to be doing well on the leg as well as the ankle on the left and the right gluteal ulcer is very dry I think we may need to use collagen and a dressing to keep this more moist allow to heal more appropriately. 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 05/22/2019 upon evaluation today patient actually appears to be making some progress here which is excellent news. She is specifically having good improvement with regard to her left ankle which appears healed the left lower extremity is making good progress. The left toe also seems to be improving. All of this I think was due to the procedure or rather lack of procedure on this leg that she finally had done she did have a follow-up with vascular and they noted that her blood flow is now excellent following her vascular intervention. Nonetheless overall I am very pleased. Her right foot ulcer is doing much better and I do feel like it is healing quite nicely is just taking its time but nonetheless were seeing improvement. The gluteal wound also appears to be smaller and better today overall I am very pleased with how things are progressing. 05/29/2019 on evaluation today patient seems to be making improvement in regard to her wounds in general. Everything is showing signs of improvement in my opinion based on what I am seeing. Fortunately there is no evidence of infection at any site which is also good news. 06/05/2019  upon inspection today patient is to be doing still somewhat better in regard to her wounds in general. Fortunately there is no signs of active infection at this time. No fever chills noted in general I feel like that she is making excellent progress at this time. This is definitely very slow but again Nicki Furlan, Siobhan ([id number]) that is understandable considering the poor blood flow she previously had. 06/15/2019 upon evaluation today patient appears to be doing extremely well in regard to all of her wounds. Her wound on the gluteal region actually appears to be completely healed which is great news. The wounds on her left leg, left toe, and right foot all are measuring smaller and looking better. In general I feel like we are headed in the right direction as far as this is concerned. No fevers, chills, nausea, vomiting, or diarrhea. 06/26/2019 upon evaluation today patient unfortunately appears to be doing worse with regard to her left foot on the first metatarsal region where she does have a significant area of erythema unfortunately. Also seems to be something under this callus region that is causing her some issues at this point. Fortunately there is no signs of active infection systemically. Unfortunately there does appear to be a local infection here which I think is going to require intervention at this point. She does need some of the callus and tissue removed from the surface of the wound  so that we can see exactly what we have going on. I discussed this with the patient and her family member during the office visit today. With that being said the other wounds seem to be making progress which is good news. Electronic Signature(s) Signed: 06/26/2019 11:17:01 AM By: Worthy Keeler PA-C Previous Signature: 06/26/2019 11:16:47 AM Version By: Worthy Keeler PA-C Entered By: Worthy Keeler on 06/26/2019 11:17:01 Sandy Roberson  ([id number]) -------------------------------------------------------------------------------- Physical Exam Details Patient Name: Sandy Roberson Date of Service: 06/26/2019 10:15 AM Medical Record Number: [id number] Patient Account Number: [id number] Date of Birth/Sex: 1916-02-06 (86 y.o. F) Treating RN: Montey Hora Primary Care Provider: Tracie Harrier Other Clinician: Referring Provider: Tracie Harrier Treating Provider/Extender: STONE III, Keymani Glynn Weeks in Treatment: 87 Constitutional Well-nourished and well-hydrated in no acute distress. Respiratory normal breathing without difficulty. Psychiatric this patient is able to make decisions and demonstrates good insight into disease process. Alert and Oriented x 3. pleasant and cooperative. Notes Upon evaluation today patient's wounds in general showed signs of good improvement which is good news. With that being said unfortunately she does seem to have a significant infection of the left foot on the first metatarsal region distally where she did have a callus buildup as well. I was able to remove the callus the blister tissue. This did reveal a small circular opening in the plantar aspect of her foot she also has an abscess 1 more medially where this has ruptured as well and is draining purulent fluid. A culture was obtained today from the side in order to test for bacteria. I do think she is going to need oral antibiotics as well to try to get things under control here. Electronic Signature(s) Signed: 06/26/2019 11:17:39 AM By: Worthy Keeler PA-C Entered By: Worthy Keeler on 06/26/2019 11:17:38 Sandy Roberson ([id number]) -------------------------------------------------------------------------------- Physician Orders Details Patient Name: Sandy Roberson Date of Service: 06/26/2019 10:15 AM Medical Record Number: [id number] Patient Account Number: [id number] Date of Birth/Sex: [birth year] (86 y.o. 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 L89.312 Pressure ulcer of right buttock, stage 2 L97.821 Non-pressure chronic ulcer of other part of left lower leg limited to breakdown of skin 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 R13.12 Dysphagia, oropharyngeal phase N18.30 Chronic kidney disease, stage 3 unspecified I50.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 (congestive) heart failure Wound Cleansing Wound #1 Right,Medial Metatarsal head first o Dial antibacterial soap, wash wounds, rinse and pat dry prior to dressing wounds Wound #2 Left,Medial Lower Leg o Dial antibacterial soap, wash wounds, rinse and pat dry prior to dressing wounds Wound #5 Left Toe Third o Dial antibacterial soap, wash wounds, rinse and pat dry prior to dressing wounds Primary Wound Dressing Wound #1 Right,Medial Metatarsal head first o Silver Collagen - cut to fit drawtex to fill extra space Wound #2 Left,Medial Lower Leg o Silver Collagen - adaptic over collagen Wound #5 Left Toe Third o Silver Collagen Wound #6 Left,Plantar Metatarsal head first o Silver Alginate Secondary Dressing Wound #1 Right,Medial Metatarsal head first o ABD pad o Conform/Kerlix - conform to secure Wound #  2 Left,Medial Lower Leg o ABD pad o Conform/Kerlix - conform to secure Wound #5 Left Toe Third o ABD pad o Conform/Kerlix - conform to secure o Other -  bandaid Wound #6 Left,Plantar Metatarsal head first o ABD pad Kincaid, Yousra ([id number]) o Conform/Kerlix - conform to secure Dressing Change Frequency Wound #1 Right,Medial Metatarsal head first o Change Dressing Monday, Wednesday, Friday Wound #2 Left,Medial Lower Leg o Change Dressing Monday, Wednesday, Friday Wound #5 Left Toe Third o Change Dressing Monday, Wednesday, Friday Wound #6 Left,Plantar Metatarsal head first o Change Dressing Monday, Wednesday, Friday Follow-up Appointments o Return Appointment in 1 week. Edema Control Wound #1 Right,Medial Metatarsal head first o Other: - Tubi Grip E single layer Wound #2 Left,Medial Lower Leg o Other: - Tubi Grip E single layer Wound #5 Left Toe Third o Other: - Tubi Grip E single layer Wound #6 Left,Plantar Metatarsal head first o Other: - Tubi Grip E single layer Home Health Wound #1 Right,Medial Metatarsal head first o [place] Visits - [place] Nurse may visit PRN to address patientos wound care needs. o Home Health Nurse may visit PRN to address patientos wound care needs. o FACE TO FACE ENCOUNTER: MEDICARE and MEDICAID PATIENTS: I certify that this patient is under my care and that I had a face-to- face encounter that meets the physician face-to-face encounter requirements with this patient on this date. The encounter with the patient was in whole or in part for the following MEDICAL CONDITION: (primary reason for [place]) MEDICAL NECESSITY: I certify, that based on my findings, NURSING services are a medically necessary home health service. HOME BOUND STATUS: I certify that my clinical findings support that this patient is homebound (i.e., Due to illness or injury, pt requires aid of supportive devices such as crutches, cane, wheelchairs, walkers, the use of special transportation or the assistance of another person to leave their place of residence.  There is a normal inability to leave the home and doing so requires considerable and taxing effort. Other absences are for medical reasons / religious services and are infrequent or of short duration when for other reasons). o FACE TO FACE ENCOUNTER: MEDICARE and MEDICAID PATIENTS: I certify that this patient is under my care and that I had a face-to- face encounter that meets the physician face-to-face encounter requirements with this patient on this date. The encounter with the patient was in whole or in part for the following MEDICAL CONDITION: (primary reason for [place]) MEDICAL NECESSITY: I certify, that based on my findings, NURSING services are a medically necessary home health service. HOME BOUND STATUS: I certify that my clinical findings support that this patient is homebound (i.e., Due to illness or injury, pt requires aid of supportive devices such as crutches, cane, wheelchairs, walkers, the use of special transportation or the assistance of another person to leave their place of residence. There is a normal inability to leave the home and doing so requires considerable and taxing effort. Other absences are for medical reasons / religious services and are infrequent or of short duration when for other reasons). o If current dressing causes regression in wound condition, may D/C ordered dressing product/s and apply Normal Saline Moist Dressing daily until next [place] / Other MD appointment. [place] of regression in wound condition at [phone]. o If current dressing causes regression in wound condition, may D/C ordered dressing product/s and apply Normal Saline Moist Dressing daily until next [facility] / Other MD appointment. [place] of regression in wound condition at [phone]. o Please direct any NON-WOUND related issues/requests for orders to patient's Primary Care Physician o Please direct any  NON-WOUND related issues/requests for orders to patient's Primary Care Physician Wound #2 Left,Medial Lower Leg o [place] Visits - [place] Nurse may visit PRN to address patientos wound care needs. o Home Health Nurse may visit PRN to address patientos wound care needs. o FACE TO FACE ENCOUNTER: MEDICARE and MEDICAID PATIENTS: I certify that this patient is under my care and that I had a face-to- face encounter that meets the physician face-to-face encounter requirements with this patient on this date. The encounter with the patient was in whole or in part for the following MEDICAL CONDITION: (primary reason for [place]) MEDICAL NECESSITY: AIESHA, LELAND ([id number]) I certify, that based on my findings, NURSING services are a medically necessary home health service. HOME BOUND STATUS: I certify that my clinical findings support that this patient is homebound (i.e., Due to illness or injury, pt requires aid of supportive devices such as crutches, cane, wheelchairs, walkers, the use of special transportation or the assistance of another person to leave their place of residence. There is a normal inability to leave the home and doing so requires considerable and taxing effort. Other absences are for medical reasons / religious services and are infrequent or of short duration when for other reasons). o FACE TO FACE ENCOUNTER: MEDICARE and MEDICAID PATIENTS: I certify that this patient is under my care and that I had a face-to- face encounter that meets the physician face-to-face encounter requirements with this patient on this date. The encounter with the patient was in whole or in part for the following MEDICAL CONDITION: (primary reason for [place]) MEDICAL NECESSITY: I certify, that based on my findings, NURSING services are a medically necessary home health service. HOME BOUND STATUS: I certify that my clinical findings support that this  patient is homebound (i.e., Due to illness or injury, pt requires aid of supportive devices such as crutches, cane, wheelchairs, walkers, the use of special transportation or the assistance of another person to leave their place of residence. There is a normal inability to leave the home and doing so requires considerable and taxing effort. Other absences are for medical reasons / religious services and are infrequent or of short duration when for other reasons). o If current dressing causes regression in wound condition, may D/C ordered dressing product/s and apply Normal Saline Moist Dressing daily until next [employer] / Other MD appointment. [place] of regression in wound condition at [phone]. o If current dressing causes regression in wound condition, may D/C ordered dressing product/s and apply Normal Saline Moist Dressing daily until next [place] / Other MD appointment. [place] of regression in wound condition at [phone]. o Please direct any NON-WOUND related issues/requests for orders to patient's Primary Care Physician o Please direct any NON-WOUND related issues/requests for orders to patient's Primary Care Physician Wound #5 Left Toe [place] Visits - [place] Nurse may visit PRN to address patientos wound care needs. o Home Health Nurse may visit PRN to address patientos wound care needs. o FACE TO FACE ENCOUNTER: MEDICARE and MEDICAID PATIENTS: I certify that this patient is under my care and that I had a face-to- face encounter that meets the physician face-to-face encounter requirements with  this patient on this date. The encounter with the patient was in whole or in part for the following MEDICAL CONDITION: (primary reason for [place]) MEDICAL NECESSITY: I certify, that based on my findings, NURSING services are a medically necessary home health  service. HOME BOUND STATUS: I certify that my clinical findings support that this patient is homebound (i.e., Due to illness or injury, pt requires aid of supportive devices such as crutches, cane, wheelchairs, walkers, the use of special transportation or the assistance of another person to leave their place of residence. There is a normal inability to leave the home and doing so requires considerable and taxing effort. Other absences are for medical reasons / religious services and are infrequent or of short duration when for other reasons). o FACE TO FACE ENCOUNTER: MEDICARE and MEDICAID PATIENTS: I certify that this patient is under my care and that I had a face-to- face encounter that meets the physician face-to-face encounter requirements with this patient on this date. The encounter with the patient was in whole or in part for the following MEDICAL CONDITION: (primary reason for [place]) MEDICAL NECESSITY: I certify, that based on my findings, NURSING services are a medically necessary home health service. HOME BOUND STATUS: I certify that my clinical findings support that this patient is homebound (i.e., Due to illness or injury, pt requires aid of supportive devices such as crutches, cane, wheelchairs, walkers, the use of special transportation or the assistance of another person to leave their place of residence. There is a normal inability to leave the home and doing so requires considerable and taxing effort. Other absences are for medical reasons / religious services and are infrequent or of short duration when for other reasons). o If current dressing causes regression in wound condition, may D/C ordered dressing product/s and apply Normal Saline Moist Dressing daily until next [place] / Other MD appointment. [place] of regression in wound condition at [phone]. o If current dressing causes regression in wound condition, may D/C ordered  dressing product/s and apply Normal Saline Moist Dressing daily until next [place] / Other MD appointment. [place] of regression in wound condition at [phone]. o Please direct any NON-WOUND related issues/requests for orders to patient's Primary Care Physician o Please direct any NON-WOUND related issues/requests for orders to patient's Primary Care Physician Wound #6 Left,Plantar Metatarsal head first o [place] Visits - [place] Nurse may visit PRN to address patientos wound care needs. o Home Health Nurse may visit PRN to address patientos wound care needs. o FACE TO FACE ENCOUNTER: MEDICARE and MEDICAID PATIENTS: I certify that this patient is under my care and that I had a face-to- face encounter that meets the physician face-to-face encounter requirements with this patient on this date. The encounter with the patient was in whole or in part for the following MEDICAL CONDITION: (primary reason for [place]) MEDICAL NECESSITY: I certify, that based on my findings, NURSING services are a medically necessary home health service. HOME BOUND STATUS: I certify that my clinical findings support that this patient is homebound (i.e., Due to illness or injury, pt requires aid of supportive devices such as crutches, cane, wheelchairs, walkers, the use of special transportation or the assistance of another person to leave their place of residence. There is a normal inability to leave the home and doing so requires considerable and taxing effort. Other absences are for medical reasons / religious  services and are infrequent or of short duration when for other reasons). o FACE TO FACE ENCOUNTER: MEDICARE and MEDICAID PATIENTS: I certify that this patient is under my care and that I had a face-to- face encounter that meets the physician face-to-face encounter requirements with this patient on this date. The encounter with  the patient was in whole or in part for the following MEDICAL CONDITION: (primary reason for [place]) MEDICAL NECESSITY: I certify, that based on my findings, NURSING services are a medically necessary home health service. HOME BOUND STATUS: I certify that my clinical findings support that this patient is homebound (i.e., Due to illness or injury, pt requires aid of supportive devices such as crutches, cane, wheelchairs, walkers, the use of special transportation or the assistance of another person to leave their place of residence. There is a normal inability to leave the home and doing so requires considerable and taxing effort. Other absences are for medical reasons / religious services and are infrequent or of short duration when for other reasons). ADI, SEALES ([id number]) o If current dressing causes regression in wound condition, may D/C ordered dressing product/s and apply Normal Saline Moist Dressing daily until next [place] / Other MD appointment. [place] of regression in wound condition at [phone]. o If current dressing causes regression in wound condition, may D/C ordered dressing product/s and apply Normal Saline Moist Dressing daily until next [place] / Other MD appointment. [place] of regression in wound condition at [phone]. o Please direct any NON-WOUND related issues/requests for orders to patient's Primary Care Physician o Please direct any NON-WOUND related issues/requests for orders to patient's Primary Care Physician Laboratory o Bacteria identified in Wound by Culture (MICRO) - Left foot oooo LOINC Code: 7616-0 VPXT Convenience Name: Wound culture routine Radiology o X-ray, foot - Left Patient Medications Allergies: penicillin Notifications Medication Indication Start End doxycycline hyclate 06/26/2019 DOSE 1 - oral 100 mg capsule - 1 capsule oral taken 2 times per  day for 14 days cefdinir 06/30/2019 DOSE 1 - oral 300 mg capsule - 1 capsule oral taken 2 times per day for 14 days Electronic Signature(s) Signed: 06/30/2019 3:04:48 PM By: Worthy Keeler PA-C Previous Signature: 06/26/2019 11:21:08 AM Version By: Worthy Keeler PA-C Entered By: Worthy Keeler on 06/30/2019 15:04:47 Sandy Roberson ([id number]) -------------------------------------------------------------------------------- Problem List Details Patient Name: Sandy Roberson Date of Service: 06/26/2019 10:15 AM Medical Record Number: [id number] Patient Account Number: [id number] Date of Birth/Sex: 11/26/1915 (86 y.o. 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 03/30/2019 No Yes muscle L89.312 Pressure ulcer of right buttock, stage 2 04/22/2019 No Yes L97.821 Non-pressure chronic ulcer of other part of left lower leg limited to 04/22/2019 No Yes breakdown of skin 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 05/15/2019 No Yes without evidence of necrosis R13.12 Dysphagia, oropharyngeal phase 03/30/2019 No Yes N18.30 Chronic kidney disease, stage 3 unspecified 03/30/2019 No Yes 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 F) Treating RN: Montey Hora Primary Care Provider: Tracie Harrier Other Clinician: Referring Provider: Tracie Harrier Treating Provider/Extender: Melburn Hake, [place] Weeks in Treatment: 12 Subjective Chief Complaint Information obtained from Patient Right medial foot surgical ulcer, Left LE ulcers, and right gluteal ulcer History of Present Illness (HPI) 03/30/2019 on evaluation today patient presents for initial evaluation here in our clinic as a referral from Dr. Selina Cooley office who is a local podiatrist. Unfortunately the patient has experienced significant issues with decreased ability to heal in regard to a surgical site and again this is a fairly complicated history. The patient has undergone 3 vascular interventions the most recent of which was actually performed on 12//20. This was subsequent to the patient having ABIs which were somewhat poor registering at 0.67 on the right with a TBI of 0.16 and on the left she was 0.77 with a TBI of 0.24. Subsequently the patient had had surgery to actually remove a portion of the first metatarsal on her right foot. They did not remove the toe although again from the patient's standpoint I am not 100% sure exactly the reasoning behind what was done or was not done but again I think a big part of the issue was trying to be as minimally invasive for the patient as possible do #1 to her arterial status #2 due to her age. Nonetheless unfortunately she has had some trouble with healing and when the sutures were removed the wound apparently dehisced. The patient does currently have more recent  vascular intervention which again was on December 4 with Dr. Lazaro Arms and at this point the patient did have angioplasty which upon completion showed inline flow into the foot with less than 20% residual stenosis after angioplasties. There were multiple sites which had to be worked on during the procedure by Dr. Lucky Cowboy. Again the patient has very problematic blood flow with regard to her arterial status at this point. She does have a history of dysphagia, stage III kidney disease, and congestive heart failure as well. 04/06/2019 on evaluation today patient appears to be doing in my opinion about the same there may be slightly better with regard to the overall. With that being said she still is healing slowly obviously has only been 1 week and now the wounds do appear to be clean enough I still think there is can be quite a bit of work to do going forward before will be where we really need to be here. She does have an appointment with vascular on Wednesday which is just 2 days away for repeat vascular studies along with evaluation with the physician as well. This will be good information for [place] to know going forward as far as her arterial status and what we can or cannot do from the standpoint of debridement or otherwise. 12/28-Patient returns at 1 week with the right medial metatarsal wound, this appears to be better with the rim of granulation tissue noted. We have been using Iodoflex. Patient had vascular studies done that indicate good flow to this foot, the left foot has ABI of 0.59 and a TBI 0.34 and patient has vascular follow-up for revascularization efforts. 04/20/2019 on evaluation today patient's right first metatarsal ulcer actually appears to be  doing about the same is making slow progress. With that being said she unfortunately has blisters over the left lower extremity, left medial ankle, and she still has apparently a pressure ulcer in the gluteal region which is in the right gluteus and is  completely new to [place]. This is something that was not known of previous. In fact they did not even tell us about it today until at the end of the visit when we were getting ready to go and her son mention this. 04/27/2019 upon evaluation today patient appears to be doing excellent with regard to her wounds all things considered. I do feel like she is making some progress here she did have her vascular intervention/angiogram which was on 04/23/2019. With that being said I do not have the records for review today as the note is not actually in yet with looking in the system. Nonetheless Dr. Lucky Cowboy did perform this procedure. The patient has had some increased swelling since the procedure in the left lower extremity but this is to be expected. Obviously that will cause the wounds to drain more. Nonetheless overall I do not see any signs of active infection. Her right foot ulcer seems to be making progress albeit slow. 05/08/2019 on evaluation today patient appears to be doing well with regard to her wounds in general. I feel like that other than the fact that her toe ulcer on the left third toe worsened from where she had just a small blood blister to where it actually developed into more of a wound. This is something that now hopefully should reverse now that she has had the intervention on the left side and hopefully is better able to heal some of these wound areas in fact I really feel like a lot is drying up and appearing much better than what it was previous. Fortunately there is no signs of active infection at this time. No fevers, chills, nausea, vomiting, or diarrhea. 05/15/2019 on evaluation today patient appears to be doing okay in general with regard to her wounds. The wound in the first metatarsal region on the right seems to be doing quite well. I see definite improvement. The lower extremity ulcers both appear to be doing well on the leg as well as the ankle on the left and the right gluteal ulcer is  very dry I think we may need to use collagen and a dressing to keep this more moist allow to heal more appropriately. 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 05/22/2019 upon evaluation today patient actually appears to be making some progress here which is excellent news. She is specifically having good improvement with regard to her left ankle which appears healed the left lower extremity is making good progress. The left toe also seems to be improving. All of this I think was due to the procedure or rather lack of procedure on this leg that she finally had done she did have a follow-up with vascular and they noted that her blood flow is now excellent following her vascular intervention. Nonetheless overall I am very pleased. Her right foot ulcer is doing much better and I do feel like it is healing quite nicely is just taking its time but nonetheless were seeing improvement. The gluteal wound also appears to be smaller  and better today overall I am very pleased with how things are progressing. DAILYNN, NANCARROW ([id number]) 05/29/2019 on evaluation today patient seems to be making improvement in regard to her wounds in general. Everything is showing signs of improvement in my opinion based on what I am seeing. Fortunately there is no evidence of infection at any site which is also good news. 06/05/2019 upon inspection today patient is to be doing still somewhat better in regard to her wounds in general. Fortunately there is no signs of active infection at this time. No fever chills noted in general I feel like that she is making excellent progress at this time. This is definitely very slow but again that is understandable considering the poor blood flow she previously had. 06/15/2019 upon evaluation today patient appears  to be doing extremely well in regard to all of her wounds. Her wound on the gluteal region actually appears to be completely healed which is great news. The wounds on her left leg, left toe, and right foot all are measuring smaller and looking better. In general I feel like we are headed in the right direction as far as this is concerned. No fevers, chills, nausea, vomiting, or diarrhea. 06/26/2019 upon evaluation today patient unfortunately appears to be doing worse with regard to her left foot on the first metatarsal region where she does have a significant area of erythema unfortunately. Also seems to be something under this callus region that is causing her some issues at this point. Fortunately there is no signs of active infection systemically. Unfortunately there does appear to be a local infection here which I think is going to require intervention at this point. She does need some of the callus and tissue removed from the surface of the wound so that we can see exactly what we have going on. I discussed this with the patient and her family member during the office visit today. With that being said the other wounds seem to be making progress which is good news. Objective Constitutional Well-nourished and well-hydrated in no acute distress. Vitals Time Taken: 10:25 AM, Height: 63 in, Weight: 118 lbs, BMI: 20.9, Temperature: 97.7 F, Pulse: 97 bpm, Respiratory Rate: 16 breaths/min, Blood Pressure: 126/62 mmHg. Respiratory normal breathing without difficulty. Psychiatric this patient is able to make decisions and demonstrates good insight into disease process. Alert and Oriented x 3. pleasant and cooperative. General Notes: Upon evaluation today patient's wounds in general showed signs of good improvement which is good news. With that being said unfortunately she does seem to have a significant infection of the left foot on the first metatarsal region distally where she did have a callus  buildup as well. I was able to remove the callus the blister tissue. This did reveal a small circular opening in the plantar aspect of her foot she also has an abscess 1 more medially where this has ruptured as well and is draining purulent fluid. A culture was obtained today from the side in order to test for bacteria. I do think she is going to need oral antibiotics as well to try to get things under control here. Integumentary (Hair, Skin) Wound #1 status is Open. Original cause of wound was Gradually Appeared. The wound is located on the Right,Medial Metatarsal head first. The wound measures 1.5cm length x 1.3cm width x 0.5cm depth; 1.532cm^2 area and 0.766cm^3 volume. There is bone, muscle, and Fat Layer (Subcutaneous Tissue) Exposed exposed. There is a medium amount of serous drainage noted. Foul odor  after cleansing was noted. The wound margin is flat and intact. There is large (67-100%) pink granulation within the wound bed. There is a small (1-33%) amount of necrotic tissue within the wound bed including Adherent Slough and Necrosis of Muscle. Wound #2 status is Open. Original cause of wound was Blister. The wound is located on the Left,Medial Lower Leg. The wound measures 2cm length x 1.5cm width x 0.1cm depth; 2.356cm^2 area and 0.236cm^3 volume. There is Fat Layer (Subcutaneous Tissue) Exposed exposed. There is a large amount of serous drainage noted. The wound margin is flat and intact. There is medium (34-66%) pink granulation within the wound bed. There is a medium (34-66%) amount of necrotic tissue within the wound bed including Adherent Slough. Wound #5 status is Open. Original cause of wound was Gradually Appeared. The wound is located on the Left Toe Third. The wound measures 0.4cm length x 0.5cm width x 0.1cm depth; 0.157cm^2 area and 0.016cm^3 volume. There is Fat Layer (Subcutaneous Tissue) Exposed exposed. There is a medium amount of serosanguineous drainage noted. There is  medium (34-66%) red granulation within the wound bed. There is a medium (34-66%) amount of necrotic tissue within the wound bed including Adherent Slough. Wound #6 status is Open. Original cause of wound was Gradually Appeared. The wound is located on the Left,Plantar Metatarsal head first. The wound measures 0.5cm length x 1cm width x 0.1cm depth; 0.393cm^2 area and 0.039cm^3 volume. There is Fat Layer (Subcutaneous Tissue) Exposed exposed. There is no tunneling or undermining noted. There is a medium amount of serosanguineous drainage noted. There is small (1-33%) pink granulation within the wound bed. There is a large (67-100%) amount of necrotic tissue within the wound bed including Eschar and Adherent Slough. GALILEAH, PIGGEE ([id number]) 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 Dysphagia, oropharyngeal phase Chronic kidney disease, stage 3 unspecified Chronic combined systolic (congestive) and diastolic (congestive) heart failure Procedures Wound #6 Pre-procedure diagnosis of Wound #6 is a Pressure Ulcer located on the Left,Plantar Metatarsal head first . There was a Selective/Open Wound Skin/Epidermis Debridement with a total area of 16 sq cm performed by STONE III, Johnston Maddocks E., PA-C. With the following instrument(s): Forceps, and Scissors to remove Non-Viable tissue/material. Material removed includes Callus, Skin: Dermis, and Skin: Epidermis after achieving pain control using Lidocaine 4% Topical Solution. 1 specimen was taken by a Swab and sent  to the lab per facility protocol. A time out was conducted at 10:59, prior to the start of the procedure. A Minimum amount of bleeding was controlled with Pressure. The procedure was tolerated well with a pain level of 0 throughout and a pain level of 0 following the procedure. Post Debridement Measurements: 0.5cm length x 1cm width x 0.8cm depth; 0.314cm^3 volume. Post debridement Stage noted as Category/Stage II. Character of Wound/Ulcer Post Debridement is improved. Post procedure Diagnosis Wound #6: Same as Pre-Procedure General Notes: circumfrential undermining at 1.4cm. Plan Wound Cleansing: Wound #1 Right,Medial Metatarsal head first: Dial antibacterial soap, wash wounds, rinse and pat dry prior to dressing wounds Wound #2 Left,Medial Lower Leg: Dial antibacterial soap, wash wounds, rinse and pat dry prior to  dressing wounds Wound #5 Left Toe Third: Dial antibacterial soap, wash wounds, rinse and pat dry prior to dressing wounds Primary Wound Dressing: Wound #1 Right,Medial Metatarsal head first: Silver Collagen - cut to fit drawtex to fill extra space Wound #2 Left,Medial Lower Leg: Silver Collagen - adaptic over collagen Wound #5 Left Toe Third: Silver Collagen Wound #6 Left,Plantar Metatarsal head first: Silver Alginate Secondary Dressing: Wound #1 Right,Medial Metatarsal head first: ABD pad Conform/Kerlix - conform to secure Wound #2 Left,Medial Lower Leg: ABD pad Conform/Kerlix - conform to secure Wound #5 Left Toe Third: ABD pad Conform/Kerlix - conform to secure Other - bandaid Dicicco, Lashai ([id number]) Wound #6 Left,Plantar Metatarsal head first: ABD pad Conform/Kerlix - conform to secure Dressing Change Frequency: Wound #1 Right,Medial Metatarsal head first: Change Dressing Monday, Wednesday, Friday Wound #2 Left,Medial Lower Leg: Change Dressing Monday, Wednesday, Friday Wound #5 Left Toe Third: Change Dressing Monday, Wednesday, Friday Wound #6  Left,Plantar Metatarsal head first: Change Dressing Monday, Wednesday, Friday Follow-up Appointments: Return Appointment in 1 week. Edema Control: Wound #1 Right,Medial Metatarsal head first: Other: - Tubi Grip E single layer Wound #2 Left,Medial Lower Leg: Other: - Tubi Grip E single layer Wound #5 Left Toe Third: Other: - Tubi Grip E single layer Wound #6 Left,Plantar Metatarsal head first: Other: - Tubi Grip E single layer Home Health: Wound #1 Right,Medial Metatarsal head first: [place] Nurse may visit PRN to address patient s wound care needs. Home Health Nurse may visit PRN to address patient s wound care needs. FACE TO FACE ENCOUNTER: MEDICARE and MEDICAID PATIENTS: I certify that this patient is under my care and that I had a face-to-face encounter that meets the physician face-to-face encounter requirements with this patient on this date. The encounter with the patient was in whole or in part for the following MEDICAL CONDITION: (primary reason for [place]) MEDICAL NECESSITY: I certify, that based on my findings, NURSING services are a medically necessary home health service. HOME BOUND STATUS: I certify that my clinical findings support that this patient is homebound (i.e., Due to illness or injury, pt requires aid of supportive devices such as crutches, cane, wheelchairs, walkers, the use of special transportation or the assistance of another person to leave their place of residence. There is a normal inability to leave the home and doing so requires considerable and taxing effort. Other absences are for medical reasons / religious services and are infrequent or of short duration when for other reasons). FACE TO FACE ENCOUNTER: MEDICARE and MEDICAID PATIENTS: I certify that this patient is under my care and that I had a face-to-face encounter that meets the physician face-to-face encounter requirements with this patient on this  date. The encounter with the patient was in whole or in part for the following MEDICAL CONDITION: (primary reason for [place]) MEDICAL NECESSITY: I certify, that based on my findings, NURSING services are a medically necessary home health service. HOME BOUND STATUS: I certify that my clinical findings support that this patient is homebound (i.e., Due to illness or injury, pt requires aid of supportive devices such as crutches, cane, wheelchairs, walkers, the use of special transportation or the assistance of another person to leave their place of residence. There is a normal inability to leave the home and doing so requires considerable and taxing effort. Other absences are for medical reasons / religious services and are infrequent or of short duration when for other reasons). If current dressing causes  regression in wound condition, may D/C ordered dressing product/s and apply Normal Saline Moist Dressing daily until next [place] / Other MD appointment. [place] of regression in wound condition at [phone]. If current dressing causes regression in wound condition, may D/C ordered dressing product/s and apply Normal Saline Moist Dressing daily until next [place] / Other MD appointment. [place] of regression in wound condition at [phone]. Please direct any NON-WOUND related issues/requests for orders to patient's Primary Care Physician Please direct any NON-WOUND related issues/requests for orders to patient's Primary Care Physician Wound #2 Left,Medial Lower Leg: [place] Nurse may visit PRN to address patient s wound care needs. Home Health Nurse may visit PRN to address patient s wound care needs. FACE TO FACE ENCOUNTER: MEDICARE and MEDICAID PATIENTS: I certify that this patient is under my care and that I had a face-to-face encounter that meets the physician face-to-face  encounter requirements with this patient on this date. The encounter with the patient was in whole or in part for the following MEDICAL CONDITION: (primary reason for [place]) MEDICAL NECESSITY: I certify, that based on my findings, NURSING services are a medically necessary home health service. HOME BOUND STATUS: I certify that my clinical findings support that this patient is homebound (i.e., Due to illness or injury, pt requires aid of supportive devices such as crutches, cane, wheelchairs, walkers, the use of special transportation or the assistance of another person to leave their place of residence. There is a normal inability to leave the home and doing so requires considerable and taxing effort. Other absences are for medical reasons / religious services and are infrequent or of short duration when for other reasons). FACE TO FACE ENCOUNTER: MEDICARE and MEDICAID PATIENTS: I certify that this patient is under my care and that I had a face-to-face encounter that meets the physician face-to-face encounter requirements with this patient on this date. The encounter with the patient was in whole or in part for the following MEDICAL CONDITION: (primary reason for [place]) MEDICAL NECESSITY: I certify, that based on my findings, NURSING services are a medically necessary home health service. HOME BOUND STATUS: I certify that my clinical findings support that this patient is homebound (i.e., Due to illness or injury, pt requires aid of supportive devices such as crutches, cane, wheelchairs, walkers, the use of special transportation or the assistance of another person to leave their place of residence. There is a normal inability to leave the home and doing so requires considerable and taxing effort. Other absences are for medical reasons / religious services and are infrequent or of short duration when for other reasons). If current dressing causes regression in wound condition, may  D/C ordered dressing product/s and apply Normal Saline Moist Dressing daily until next [place] / Other MD appointment. [place] of regression in wound condition at [phone]. ELEASHA, CATALDO ([id number]) If current dressing causes regression in wound condition, may D/C ordered dressing product/s and apply Normal Saline Moist Dressing daily until next [place] / Other MD appointment. [place] of regression in wound condition at [phone]. Please direct any NON-WOUND related issues/requests for orders to patient's Primary Care Physician Please direct any NON-WOUND related issues/requests for orders to patient's Primary Care Physician Wound #5 Left Toe Third: [place] Nurse may visit PRN to address patient s wound care needs.  Home Health Nurse may visit PRN to address patient s wound care needs. FACE TO FACE ENCOUNTER: MEDICARE and MEDICAID PATIENTS: I certify that this patient is under my care and that I had a face-to-face encounter that meets the physician face-to-face encounter requirements with this patient on this date. The encounter with the patient was in whole or in part for the following MEDICAL CONDITION: (primary reason for [place]) MEDICAL NECESSITY: I certify, that based on my findings, NURSING services are a medically necessary home health service. HOME BOUND STATUS: I certify that my clinical findings support that this patient is homebound (i.e., Due to illness or injury, pt requires aid of supportive devices such as crutches, cane, wheelchairs, walkers, the use of special transportation or the assistance of another person to leave their place of residence. There is a normal inability to leave the home and doing so requires considerable and taxing effort. Other absences are for medical reasons / religious services and are infrequent or of short duration when for  other reasons). FACE TO FACE ENCOUNTER: MEDICARE and MEDICAID PATIENTS: I certify that this patient is under my care and that I had a face-to-face encounter that meets the physician face-to-face encounter requirements with this patient on this date. The encounter with the patient was in whole or in part for the following MEDICAL CONDITION: (primary reason for [place]) MEDICAL NECESSITY: I certify, that based on my findings, NURSING services are a medically necessary home health service. HOME BOUND STATUS: I certify that my clinical findings support that this patient is homebound (i.e., Due to illness or injury, pt requires aid of supportive devices such as crutches, cane, wheelchairs, walkers, the use of special transportation or the assistance of another person to leave their place of residence. There is a normal inability to leave the home and doing so requires considerable and taxing effort. Other absences are for medical reasons / religious services and are infrequent or of short duration when for other reasons). If current dressing causes regression in wound condition, may D/C ordered dressing product/s and apply Normal Saline Moist Dressing daily until next [place] / Other MD appointment. [place] of regression in wound condition at [phone]. If current dressing causes regression in wound condition, may D/C ordered dressing product/s and apply Normal Saline Moist Dressing daily until next [place] / Other MD appointment. [place] of regression in wound condition at [phone]. Please direct any NON-WOUND related issues/requests for orders to patient's Primary Care Physician Please direct any NON-WOUND related issues/requests for orders to patient's Primary Care Physician Wound #6 Left,Plantar Metatarsal head first: [place] Visits - [place] Nurse may visit PRN to address patient s wound care  needs. Home Health Nurse may visit PRN to address patient s wound care needs. FACE TO FACE ENCOUNTER: MEDICARE and MEDICAID PATIENTS: I certify that this patient is under my care and that I had a face-to-face encounter that meets the physician face-to-face encounter requirements with this patient on this date. The encounter with the patient was in whole or in part for the following MEDICAL CONDITION: (primary reason for [place]) MEDICAL NECESSITY: I certify, that based on my findings, NURSING services are a medically necessary home health service. HOME BOUND STATUS: I certify that my clinical findings support that this patient is homebound (i.e., Due to illness or injury, pt requires aid of supportive devices such as crutches, cane, wheelchairs, walkers, the use of special transportation or  the assistance of another person to leave their place of residence. There is a normal inability to leave the home and doing so requires considerable and taxing effort. Other absences are for medical reasons / religious services and are infrequent or of short duration when for other reasons). FACE TO FACE ENCOUNTER: MEDICARE and MEDICAID PATIENTS: I certify that this patient is under my care and that I had a face-to-face encounter that meets the physician face-to-face encounter requirements with this patient on this date. The encounter with the patient was in whole or in part for the following MEDICAL CONDITION: (primary reason for [place]) MEDICAL NECESSITY: I certify, that based on my findings, NURSING services are a medically necessary home health service. HOME BOUND STATUS: I certify that my clinical findings support that this patient is homebound (i.e., Due to illness or injury, pt requires aid of supportive devices such as crutches, cane, wheelchairs, walkers, the use of special transportation or the assistance of another person to leave their place of residence. There is a normal inability to  leave the home and doing so requires considerable and taxing effort. Other absences are for medical reasons / religious services and are infrequent or of short duration when for other reasons). If current dressing causes regression in wound condition, may D/C ordered dressing product/s and apply Normal Saline Moist Dressing daily until next [place] / Other MD appointment. [place] of regression in wound condition at [phone]. If current dressing causes regression in wound condition, may D/C ordered dressing product/s and apply Normal Saline Moist Dressing daily until next [place] / Other MD appointment. [place] of regression in wound condition at [phone]. Please direct any NON-WOUND related issues/requests for orders to patient's Primary Care Physician Please direct any NON-WOUND related issues/requests for orders to patient's Primary Care Physician Radiology ordered were: X-ray, foot - Left Laboratory ordered were: Wound culture routine - Left foot The following medication(s) was prescribed: doxycycline hyclate oral 100 mg capsule 1 1 capsule oral taken 2 times per day for 14 days starting 06/26/2019 cefdinir oral 300 mg capsule 1 1 capsule oral taken 2 times per day for 14 days starting 06/30/2019 1. My suggestion at this time is good to be that we go ahead and initiate treatment with a silver collagen dressing to all wound locations as we have before the 1 change we made today he actually had to deal with the left lower extremity where we would use a contact layer over top of this in order to help with controlling moisture so this does not become too dry. 2. I am also can recommend that we continue rather initiate a silver alginate dressing for the left foot specifically in the region of this new abscess. The AKILI, CUDA ([phone]) the patient is in agreement with the plan as is her family member. I am going to  send in a prescription for doxycycline today as well to the pharmacy. 3. We do need to try to avoid any injury in the meantime to the left foot as this attempt to heal. I do think the culture that was obtained today will be helpful in identifying and helping with directed therapy depending on how this shows. Nonetheless in the meantime I think that right now we will start with doxycycline which is a good broad-spectrum antibiotic. 4. I am going to send the patient for a x-ray as well of the left foot in order to evaluate for any signs  of osteomyelitis. She is obviously had issues with this in the past. We will see patient back for reevaluation in 1 week here in the clinic. If anything worsens or changes patient will contact our office for additional recommendations. Addendum on 06/30/2019 I did review the patient's x-ray which showed no evidence of osseous abnormality at this point which is good news obviously no sign of osteomyelitis specifically. I did also review her culture which showed Proteus Mirabilis. Unfortunately she is allergic to amoxicillin specifically. She is also resistant to Cipro as well as Bactrim at the doxycycline would not be effective which is what I gave her. For that reason Carole [facility] is one of the few options that I see that we could utilize at this point to try to help with her infection at this point. Everything else would be IV. For that reason I am going to go ahead and proceed with the [facility] even though there is some small chance of cross-reactivity from that and the penicillin. I still think this is the best option for her. We will have them monitor for any signs of reaction to this medication. Electronic Signature(s) Signed: 06/30/2019 3:07:43 PM By: Worthy Keeler PA-C Previous Signature: 06/26/2019 11:21:16 AM Version By: Worthy Keeler PA-C Entered By: Worthy Keeler on 06/30/2019 15:07:43 Sandy Roberson  ([id number]) -------------------------------------------------------------------------------- SuperBill Details Patient Name: Sandy Roberson Date of Service: 06/26/2019 Medical Record Number: [id number] Patient Account Number: [id number] Date of Birth/Sex: 28-Jul-1915 (86 y.o. F) Treating RN: Montey Hora Primary Care Provider: Tracie Harrier Other Clinician: Referring Provider: Tracie Harrier Treating Provider/Extender: Melburn Hake, Cosette Prindle Weeks in Treatment: 12 Diagnosis Coding ICD-10 Codes Code Description I73.89 Other specified peripheral vascular diseases T81.31XA Disruption of external operation (surgical) wound, not elsewhere classified, initial encounter L97.513 Non-pressure chronic ulcer of other part of right foot with necrosis of muscle L89.312 Pressure ulcer of right buttock, stage 2 L97.821 Non-pressure chronic ulcer of other part of left lower leg limited to breakdown of skin 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 R13.12 Dysphagia, oropharyngeal phase N18.30 Chronic kidney disease, stage 3 unspecified I50.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 (congestive) heart failure Facility Procedures CPT4 Code Description: [id number] - [place] WOUND 1ST 20 SQ CM OR < Modifier: Quantity: 1 CPT4 Code Description: ICD-10 Diagnosis Description 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 witho Modifier: ut evidence of n Quantity: ecrosis Physician Procedures CPT4 Code Description: [id number] 19417 - WC PHYS LEVEL 4 - EST PT Modifier: 25 Quantity: 1 CPT4 Code Description: ICD-10 Diagnosis Description I73.89 Other specified peripheral vascular diseases T81.31XA Disruption of external operation (surgical) wound, not elsewhere classified, init L97.513 Non-pressure chronic ulcer of other part of right  foot with necrosis of muscle L89.312 Pressure ulcer  of right buttock, stage 2 Modifier: ial encounter Quantity: CPT4 Code Description: [id number] - WC PHYS DEBR WO ANESTH 20 SQ CM Modifier: Quantity: 1 CPT4 Code Description: ICD-10 Diagnosis Description 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

## 2019-06-28 LAB — AEROBIC CULTURE W GRAM STAIN (SUPERFICIAL SPECIMEN)

## 2019-06-29 ENCOUNTER — Ambulatory Visit: Payer: Medicare Other | Admitting: Physician Assistant

## 2019-07-01 ENCOUNTER — Other Ambulatory Visit: Payer: Self-pay

## 2019-07-01 ENCOUNTER — Inpatient Hospital Stay: Payer: Medicare Other | Admitting: Anesthesiology

## 2019-07-01 ENCOUNTER — Encounter: Payer: Self-pay | Admitting: Emergency Medicine

## 2019-07-01 ENCOUNTER — Emergency Department: Payer: Medicare Other

## 2019-07-01 ENCOUNTER — Inpatient Hospital Stay: Payer: Medicare Other

## 2019-07-01 ENCOUNTER — Inpatient Hospital Stay
Admission: EM | Admit: 2019-07-01 | Discharge: 2019-07-03 | DRG: 853 | Disposition: A | Discharge status: Home Health | Payer: Medicare Other | Attending: Internal Medicine | Admitting: Internal Medicine

## 2019-07-01 ENCOUNTER — Encounter
Admission: EM | Disposition: A | Discharge status: Home Health | Payer: Self-pay | Source: Home / Self Care | Attending: Internal Medicine

## 2019-07-01 DIAGNOSIS — N136 Pyonephrosis: Secondary | ICD-10-CM | POA: Diagnosis present

## 2019-07-01 DIAGNOSIS — E0590 Thyrotoxicosis, unspecified without thyrotoxic crisis or storm: Secondary | ICD-10-CM | POA: Diagnosis present

## 2019-07-01 DIAGNOSIS — Z79899 Other long term (current) drug therapy: Secondary | ICD-10-CM

## 2019-07-01 DIAGNOSIS — R778 Other specified abnormalities of plasma proteins: Secondary | ICD-10-CM

## 2019-07-01 DIAGNOSIS — I2510 Atherosclerotic heart disease of native coronary artery without angina pectoris: Secondary | ICD-10-CM | POA: Diagnosis present

## 2019-07-01 DIAGNOSIS — A419 Sepsis, unspecified organism: Principal | ICD-10-CM

## 2019-07-01 DIAGNOSIS — E039 Hypothyroidism, unspecified: Secondary | ICD-10-CM | POA: Diagnosis present

## 2019-07-01 DIAGNOSIS — N183 Chronic kidney disease, stage 3 unspecified: Secondary | ICD-10-CM | POA: Diagnosis not present

## 2019-07-01 DIAGNOSIS — E872 Acidosis, unspecified: Secondary | ICD-10-CM

## 2019-07-01 DIAGNOSIS — Z880 Allergy status to penicillin: Secondary | ICD-10-CM

## 2019-07-01 DIAGNOSIS — I130 Hypertensive heart and chronic kidney disease with heart failure and stage 1 through stage 4 chronic kidney disease, or unspecified chronic kidney disease: Secondary | ICD-10-CM | POA: Diagnosis present

## 2019-07-01 DIAGNOSIS — L97529 Non-pressure chronic ulcer of other part of left foot with unspecified severity: Secondary | ICD-10-CM | POA: Diagnosis present

## 2019-07-01 DIAGNOSIS — J189 Pneumonia, unspecified organism: Secondary | ICD-10-CM

## 2019-07-01 DIAGNOSIS — Z66 Do not resuscitate: Secondary | ICD-10-CM | POA: Diagnosis present

## 2019-07-01 DIAGNOSIS — N390 Urinary tract infection, site not specified: Secondary | ICD-10-CM | POA: Diagnosis present

## 2019-07-01 DIAGNOSIS — L97519 Non-pressure chronic ulcer of other part of right foot with unspecified severity: Secondary | ICD-10-CM

## 2019-07-01 DIAGNOSIS — B9620 Unspecified Escherichia coli [E. coli] as the cause of diseases classified elsewhere: Secondary | ICD-10-CM | POA: Diagnosis present

## 2019-07-01 DIAGNOSIS — N9089 Other specified noninflammatory disorders of vulva and perineum: Secondary | ICD-10-CM | POA: Diagnosis present

## 2019-07-01 DIAGNOSIS — L98499 Non-pressure chronic ulcer of skin of other sites with unspecified severity: Secondary | ICD-10-CM

## 2019-07-01 DIAGNOSIS — N138 Other obstructive and reflux uropathy: Secondary | ICD-10-CM | POA: Diagnosis not present

## 2019-07-01 DIAGNOSIS — R6520 Severe sepsis without septic shock: Secondary | ICD-10-CM | POA: Diagnosis not present

## 2019-07-01 DIAGNOSIS — D638 Anemia in other chronic diseases classified elsewhere: Secondary | ICD-10-CM | POA: Diagnosis present

## 2019-07-01 DIAGNOSIS — Y92009 Unspecified place in unspecified non-institutional (private) residence as the place of occurrence of the external cause: Secondary | ICD-10-CM | POA: Diagnosis not present

## 2019-07-01 DIAGNOSIS — N132 Hydronephrosis with renal and ureteral calculous obstruction: Secondary | ICD-10-CM | POA: Diagnosis not present

## 2019-07-01 DIAGNOSIS — W19XXXA Unspecified fall, initial encounter: Secondary | ICD-10-CM

## 2019-07-01 DIAGNOSIS — N1832 Chronic kidney disease, stage 3b: Secondary | ICD-10-CM | POA: Diagnosis present

## 2019-07-01 DIAGNOSIS — I70209 Unspecified atherosclerosis of native arteries of extremities, unspecified extremity: Secondary | ICD-10-CM | POA: Diagnosis present

## 2019-07-01 DIAGNOSIS — E869 Volume depletion, unspecified: Secondary | ICD-10-CM | POA: Diagnosis present

## 2019-07-01 DIAGNOSIS — Z955 Presence of coronary angioplasty implant and graft: Secondary | ICD-10-CM

## 2019-07-01 DIAGNOSIS — R531 Weakness: Secondary | ICD-10-CM | POA: Diagnosis not present

## 2019-07-01 DIAGNOSIS — Z7982 Long term (current) use of aspirin: Secondary | ICD-10-CM

## 2019-07-01 DIAGNOSIS — E785 Hyperlipidemia, unspecified: Secondary | ICD-10-CM | POA: Diagnosis present

## 2019-07-01 DIAGNOSIS — Z20822 Contact with and (suspected) exposure to covid-19: Secondary | ICD-10-CM | POA: Diagnosis present

## 2019-07-01 DIAGNOSIS — Z7902 Long term (current) use of antithrombotics/antiplatelets: Secondary | ICD-10-CM

## 2019-07-01 DIAGNOSIS — L8990 Pressure ulcer of unspecified site, unspecified stage: Secondary | ICD-10-CM | POA: Insufficient documentation

## 2019-07-01 DIAGNOSIS — M25551 Pain in right hip: Secondary | ICD-10-CM

## 2019-07-01 DIAGNOSIS — I5032 Chronic diastolic (congestive) heart failure: Secondary | ICD-10-CM | POA: Diagnosis present

## 2019-07-01 DIAGNOSIS — R7989 Other specified abnormal findings of blood chemistry: Secondary | ICD-10-CM

## 2019-07-01 DIAGNOSIS — Z8249 Family history of ischemic heart disease and other diseases of the circulatory system: Secondary | ICD-10-CM

## 2019-07-01 DIAGNOSIS — N179 Acute kidney failure, unspecified: Secondary | ICD-10-CM | POA: Diagnosis present

## 2019-07-01 DIAGNOSIS — Z87442 Personal history of urinary calculi: Secondary | ICD-10-CM

## 2019-07-01 HISTORY — PX: CYSTOSCOPY W/ URETERAL STENT PLACEMENT: SHX1429

## 2019-07-01 LAB — URINALYSIS, COMPLETE (UACMP) WITH MICROSCOPIC
Bilirubin Urine: NEGATIVE
Glucose, UA: NEGATIVE mg/dL
Ketones, ur: NEGATIVE mg/dL
Nitrite: NEGATIVE
Protein, ur: 100 mg/dL — AB
RBC / HPF: >50 RBC/hpf — ABNORMAL HIGH (ref 0–5)
Specific Gravity, Urine: 1.012 (ref 1.005–1.030)
WBC, UA: >50 WBC/hpf — ABNORMAL HIGH (ref 0–5)
pH: 5 (ref 5.0–8.0)

## 2019-07-01 LAB — CBC WITH DIFFERENTIAL/PLATELET
Abs Immature Granulocytes: 0.19 10*3/uL — ABNORMAL HIGH (ref 0.00–0.07)
Basophils Absolute: 0.1 10*3/uL (ref 0.0–0.1)
Basophils Relative: 0 %
Eosinophils Absolute: 0 10*3/uL (ref 0.0–0.5)
Eosinophils Relative: 0 %
HCT: 31.6 % — ABNORMAL LOW (ref 36.0–46.0)
Hemoglobin: 10.5 g/dL — ABNORMAL LOW (ref 12.0–15.0)
Immature Granulocytes: 1 %
Lymphocytes Relative: 1 %
Lymphs Abs: 0.2 10*3/uL — ABNORMAL LOW (ref 0.7–4.0)
MCH: 30.8 pg (ref 26.0–34.0)
MCHC: 33.2 g/dL (ref 30.0–36.0)
MCV: 92.7 fL (ref 80.0–100.0)
Monocytes Absolute: 0.6 10*3/uL (ref 0.1–1.0)
Monocytes Relative: 2 %
Neutro Abs: 22.5 10*3/uL — ABNORMAL HIGH (ref 1.7–7.7)
Neutrophils Relative %: 96 %
Platelets: 217 10*3/uL (ref 150–400)
RBC: 3.41 MIL/uL — ABNORMAL LOW (ref 3.87–5.11)
RDW: 15.9 % — ABNORMAL HIGH (ref 11.5–15.5)
WBC: 23.5 10*3/uL — ABNORMAL HIGH (ref 4.0–10.5)
nRBC: 0 % (ref 0.0–0.2)

## 2019-07-01 LAB — COMPREHENSIVE METABOLIC PANEL
ALT: 21 U/L (ref 0–44)
AST: 30 U/L (ref 15–41)
Albumin: 3 g/dL — ABNORMAL LOW (ref 3.5–5.0)
Alkaline Phosphatase: 71 U/L (ref 38–126)
Anion gap: 11 (ref 5–15)
BUN: 65 mg/dL — ABNORMAL HIGH (ref 8–23)
CO2: 24 mmol/L (ref 22–32)
Calcium: 9.5 mg/dL (ref 8.9–10.3)
Chloride: 102 mmol/L (ref 98–111)
Creatinine, Ser: 2.15 mg/dL — ABNORMAL HIGH (ref 0.44–1.00)
GFR calc Af Amer: 21 mL/min — ABNORMAL LOW (ref >60)
GFR calc non Af Amer: 18 mL/min — ABNORMAL LOW (ref >60)
Glucose, Bld: 142 mg/dL — ABNORMAL HIGH (ref 70–99)
Potassium: 4.4 mmol/L (ref 3.5–5.1)
Sodium: 137 mmol/L (ref 135–145)
Total Bilirubin: 0.8 mg/dL (ref 0.3–1.2)
Total Protein: 7.1 g/dL (ref 6.5–8.1)

## 2019-07-01 LAB — TROPONIN I (HIGH SENSITIVITY): Troponin I (High Sensitivity): 78 ng/L — ABNORMAL HIGH (ref <18)

## 2019-07-01 LAB — RESPIRATORY PANEL BY RT PCR (FLU A&B, COVID)
Influenza A by PCR: NEGATIVE
Influenza B by PCR: NEGATIVE
SARS Coronavirus 2 by RT PCR: NEGATIVE

## 2019-07-01 LAB — LACTIC ACID, PLASMA
Lactic Acid, Venous: 3.3 mmol/L (ref 0.5–1.9)
Lactic Acid, Venous: 3.2 mmol/L (ref 0.5–1.9)

## 2019-07-01 LAB — LIPASE, BLOOD: Lipase: 26 U/L (ref 11–51)

## 2019-07-01 LAB — CK: Total CK: 20 U/L — ABNORMAL LOW (ref 38–234)

## 2019-07-01 LAB — PROCALCITONIN: Procalcitonin: 6.98 ng/mL

## 2019-07-01 SURGERY — CYSTOSCOPY, WITH RETROGRADE PYELOGRAM AND URETERAL STENT INSERTION
Anesthesia: General | Laterality: Right

## 2019-07-01 MED ORDER — ONDANSETRON HCL 4 MG/2ML IJ SOLN: 4.0000 mg | Freq: Four times a day (QID) | INTRAMUSCULAR | Status: DC | PRN

## 2019-07-01 MED ORDER — ACETAMINOPHEN 325 MG PO TABS: 650.0000 mg | ORAL_TABLET | Freq: Four times a day (QID) | ORAL | Status: DC | PRN

## 2019-07-01 MED ORDER — VITAMIN D 25 MCG (1000 UNIT) PO TABS
2000.0000 [IU] | ORAL_TABLET | Freq: Every day | ORAL | Status: DC
  Administered 2019-07-02 – 2019-07-03 (×2): 2000 [IU] via ORAL
  Filled 2019-07-01 (×2): qty 2

## 2019-07-01 MED ORDER — SODIUM CHLORIDE 0.9 % IV BOLUS: 250.0000 mL | Freq: Once | INTRAVENOUS | Status: DC

## 2019-07-01 MED ORDER — LACTATED RINGERS IV BOLUS (SEPSIS)
250.0000 mL | Freq: Once | INTRAVENOUS | Status: AC
  Administered 2019-07-01: 250 mL via INTRAVENOUS

## 2019-07-01 MED ORDER — SODIUM CHLORIDE FLUSH 0.9 % IV SOLN
INTRAVENOUS | Status: AC
  Filled 2019-07-01: qty 10

## 2019-07-01 MED ORDER — LEVOFLOXACIN IN D5W 750 MG/150ML IV SOLN: 750.0000 mg | Freq: Once | INTRAVENOUS | Status: DC

## 2019-07-01 MED ORDER — LIDOCAINE HCL (CARDIAC) PF 100 MG/5ML IV SOSY
PREFILLED_SYRINGE | INTRAVENOUS | Status: DC | PRN
  Administered 2019-07-01: 40 mg via INTRAVENOUS

## 2019-07-01 MED ORDER — PHENYLEPHRINE HCL (PRESSORS) 10 MG/ML IV SOLN
INTRAVENOUS | Status: DC | PRN
  Administered 2019-07-01 (×4): 100 ug via INTRAVENOUS

## 2019-07-01 MED ORDER — FENTANYL CITRATE (PF) 100 MCG/2ML IJ SOLN
INTRAMUSCULAR | Status: DC | PRN
  Administered 2019-07-01: 25 ug via INTRAVENOUS

## 2019-07-01 MED ORDER — METHIMAZOLE 5 MG PO TABS
5.0000 mg | ORAL_TABLET | Freq: Every day | ORAL | Status: DC
  Administered 2019-07-02 – 2019-07-03 (×2): 5 mg via ORAL
  Filled 2019-07-01 (×3): qty 1

## 2019-07-01 MED ORDER — DEXAMETHASONE SODIUM PHOSPHATE 10 MG/ML IJ SOLN
INTRAMUSCULAR | Status: DC | PRN
  Administered 2019-07-01: 10 mg via INTRAVENOUS

## 2019-07-01 MED ORDER — PROPOFOL 10 MG/ML IV BOLUS
INTRAVENOUS | Status: DC | PRN
  Administered 2019-07-01: 70 mg via INTRAVENOUS

## 2019-07-01 MED ORDER — SODIUM CHLORIDE 0.9 % IV SOLN: INTRAVENOUS | Status: DC

## 2019-07-01 MED ORDER — CALCITRIOL 0.25 MCG PO CAPS
0.2500 ug | ORAL_CAPSULE | Freq: Every day | ORAL | Status: DC
  Administered 2019-07-02 – 2019-07-03 (×2): 0.25 ug via ORAL
  Filled 2019-07-01 (×3): qty 1

## 2019-07-01 MED ORDER — IOHEXOL 180 MG/ML  SOLN
INTRAMUSCULAR | Status: DC | PRN
  Administered 2019-07-01: 4 mL

## 2019-07-01 MED ORDER — VANCOMYCIN VARIABLE DOSE PER UNSTABLE RENAL FUNCTION (PHARMACIST DOSING): Status: DC

## 2019-07-01 MED ORDER — AZITHROMYCIN 250 MG PO TABS: 500.0000 mg | ORAL_TABLET | Freq: Every day | ORAL | Status: AC

## 2019-07-01 MED ORDER — SUCCINYLCHOLINE CHLORIDE 20 MG/ML IJ SOLN
INTRAMUSCULAR | Status: DC | PRN
  Administered 2019-07-01: 80 mg via INTRAVENOUS

## 2019-07-01 MED ORDER — AZITHROMYCIN 250 MG PO TABS
250.0000 mg | ORAL_TABLET | Freq: Every day | ORAL | Status: DC
  Administered 2019-07-02: 10:00:00 250 mg via ORAL
  Filled 2019-07-01: qty 1

## 2019-07-01 MED ORDER — METRONIDAZOLE IN NACL 5-0.79 MG/ML-% IV SOLN
500.0000 mg | Freq: Once | INTRAVENOUS | Status: AC
  Administered 2019-07-01: 500 mg via INTRAVENOUS
  Filled 2019-07-01: qty 100

## 2019-07-01 MED ORDER — SODIUM CHLORIDE 0.9 % IV SOLN
2.0000 g | INTRAVENOUS | Status: DC
  Administered 2019-07-01 – 2019-07-03 (×3): 2 g via INTRAVENOUS
  Filled 2019-07-01 (×3): qty 2

## 2019-07-01 MED ORDER — ONDANSETRON HCL 4 MG/2ML IJ SOLN
INTRAMUSCULAR | Status: DC | PRN
  Administered 2019-07-01: 4 mg via INTRAVENOUS

## 2019-07-01 MED ORDER — HEPARIN SODIUM (PORCINE) 5000 UNIT/ML IJ SOLN
5000.0000 [IU] | Freq: Three times a day (TID) | INTRAMUSCULAR | Status: DC
  Administered 2019-07-01 – 2019-07-03 (×5): 5000 [IU] via SUBCUTANEOUS
  Filled 2019-07-01 (×5): qty 1

## 2019-07-01 MED ORDER — ONDANSETRON HCL 4 MG/2ML IJ SOLN: 4.0000 mg | Freq: Once | INTRAMUSCULAR | Status: DC | PRN

## 2019-07-01 MED ORDER — LACTATED RINGERS IV BOLUS (SEPSIS)
500.0000 mL | Freq: Once | INTRAVENOUS | Status: AC
  Administered 2019-07-01: 500 mL via INTRAVENOUS

## 2019-07-01 MED ORDER — FENTANYL CITRATE (PF) 100 MCG/2ML IJ SOLN: 25.0000 ug | INTRAMUSCULAR | Status: DC | PRN

## 2019-07-01 MED ORDER — EPHEDRINE SULFATE 50 MG/ML IJ SOLN
INTRAMUSCULAR | Status: AC
  Administered 2019-07-01: 10 mg via INTRAVENOUS
  Filled 2019-07-01: qty 1

## 2019-07-01 MED ORDER — ASPIRIN 81 MG PO CHEW
81.0000 mg | CHEWABLE_TABLET | Freq: Every day | ORAL | Status: DC
  Administered 2019-07-02 – 2019-07-03 (×2): 81 mg via ORAL
  Filled 2019-07-01 (×2): qty 1

## 2019-07-01 MED ORDER — ACETAMINOPHEN 650 MG RE SUPP: 650.0000 mg | Freq: Four times a day (QID) | RECTAL | Status: DC | PRN

## 2019-07-01 MED ORDER — VANCOMYCIN HCL IN DEXTROSE 1-5 GM/200ML-% IV SOLN
1000.0000 mg | Freq: Once | INTRAVENOUS | Status: AC
  Administered 2019-07-01: 1000 mg via INTRAVENOUS
  Filled 2019-07-01: qty 200

## 2019-07-01 MED ORDER — EPHEDRINE SULFATE 50 MG/ML IJ SOLN
INTRAMUSCULAR | Status: DC | PRN
  Administered 2019-07-01: 10 mg via INTRAVENOUS
  Administered 2019-07-01 (×2): 5 mg via INTRAVENOUS

## 2019-07-01 MED ORDER — LACTATED RINGERS IV BOLUS (SEPSIS)
1000.0000 mL | Freq: Once | INTRAVENOUS | Status: AC
  Administered 2019-07-01: 1000 mL via INTRAVENOUS

## 2019-07-01 MED ORDER — ONDANSETRON HCL 4 MG PO TABS: 4.0000 mg | ORAL_TABLET | Freq: Four times a day (QID) | ORAL | Status: DC | PRN

## 2019-07-01 MED ORDER — SODIUM CHLORIDE 0.9 % IV SOLN
2.0000 g | INTRAVENOUS | Status: DC
  Administered 2019-07-01: 2 g via INTRAVENOUS
  Filled 2019-07-01: qty 20

## 2019-07-01 MED ORDER — EPHEDRINE SULFATE 50 MG/ML IJ SOLN: 10.0000 mg | Freq: Once | INTRAMUSCULAR | Status: AC

## 2019-07-01 MED ORDER — CHLORHEXIDINE GLUCONATE CLOTH 2 % EX PADS
6.0000 | MEDICATED_PAD | Freq: Every day | CUTANEOUS | Status: DC
  Administered 2019-07-01: 6 via TOPICAL

## 2019-07-01 SURGICAL SUPPLY — 22 items
BAG DRAIN CYSTO-URO LG1000N (MISCELLANEOUS) ×3 IMPLANT
BRUSH SCRUB EZ 1% IODOPHOR (MISCELLANEOUS) ×3 IMPLANT
CATH URETL 5X70 OPEN END (CATHETERS) ×3 IMPLANT
CNTNR SPEC 2.5X3XGRAD LEK (MISCELLANEOUS) ×1
CONT SPEC 4OZ STER OR WHT (MISCELLANEOUS) ×2
CONTAINER SPEC 2.5X3XGRAD LEK (MISCELLANEOUS) IMPLANT
GLOVE BIO SURGEON STRL SZ 6.5 (GLOVE) ×2 IMPLANT
GLOVE BIO SURGEONS STRL SZ 6.5 (GLOVE) ×1
GOWN STRL REUS W/ TWL LRG LVL3 (GOWN DISPOSABLE) ×2 IMPLANT
GOWN STRL REUS W/TWL LRG LVL3 (GOWN DISPOSABLE) ×4
GUIDEWIRE STR DUAL SENSOR (WIRE) ×3 IMPLANT
KIT TURNOVER CYSTO (KITS) ×3 IMPLANT
PACK CYSTO AR (MISCELLANEOUS) ×3 IMPLANT
SET CYSTO W/LG BORE CLAMP LF (SET/KITS/TRAYS/PACK) ×3 IMPLANT
SOL .9 NS 3000ML IRR  AL (IV SOLUTION) ×2
SOL .9 NS 3000ML IRR UROMATIC (IV SOLUTION) ×1 IMPLANT
STENT URET 6FRX24 CONTOUR (STENTS) ×2 IMPLANT
STENT URET 6FRX26 CONTOUR (STENTS) IMPLANT
SURGILUBE 2OZ TUBE FLIPTOP (MISCELLANEOUS) ×3 IMPLANT
SYRINGE IRR TOOMEY STRL 70CC (SYRINGE) ×3 IMPLANT
TRAY FOLEY SLVR 16FR LF STAT (SET/KITS/TRAYS/PACK) ×2 IMPLANT
WATER STERILE IRR 1000ML POUR (IV SOLUTION) ×3 IMPLANT

## 2019-07-01 NOTE — Anesthesia Preprocedure Evaluation (Addendum)
Anesthesia Evaluation  Patient identified by MRN, date of birth, ID band Patient awake    Reviewed: Allergy & Precautions, NPO status , Patient's Chart, lab work & pertinent test results  History of Anesthesia Complications Negative for: history of anesthetic complications  Airway Mallampati: III       Dental   Pulmonary neg sleep apnea, pneumonia, neg COPD, Not current smoker,    Pulmonary exam normal        Cardiovascular hypertension, Pt. on medications + CAD, + Peripheral Vascular Disease and +CHF  (-) Past MI Normal cardiovascular exam(-) dysrhythmias (-) Valvular Problems/Murmurs     Neuro/Psych neg Seizures negative neurological ROS  negative psych ROS   GI/Hepatic Neg liver ROS, neg GERD  ,dysphagia   Endo/Other  neg diabetesHyperthyroidism   Renal/GU Renal Insufficiency and ARFRenal disease     Musculoskeletal   Abdominal Normal abdominal exam  (+)   Peds negative pediatric ROS (+)  Hematology  (+) anemia ,   Anesthesia Other Findings Past Medical History: No date: Coronary artery disease No date: Hyperlipidemia No date: Hypertension No date: Renal disorder  Reproductive/Obstetrics                             Anesthesia Physical  Anesthesia Plan  ASA: III  Anesthesia Plan: General   Post-op Pain Management:    Induction: Intravenous, Rapid sequence and Cricoid pressure planned  PONV Risk Score and Plan:   Airway Management Planned: Oral ETT  Additional Equipment:   Intra-op Plan:   Post-operative Plan: Extubation in OR  Informed Consent: I have reviewed the patients History and Physical, chart, labs and discussed the procedure including the risks, benefits and alternatives for the proposed anesthesia with the patient or authorized representative who has indicated his/her understanding and acceptance.     Dental advisory given  Plan Discussed with: CRNA and  Surgeon  Anesthesia Plan Comments:        Anesthesia Quick Evaluation

## 2019-07-01 NOTE — Consult Note (Addendum)
Pharmacy Antibiotic Note  Sandy Roberson is a 86 y.o. female admitted on 07/01/2019 with pneumonia.  Pharmacy has been consulted for levofloxacin dosing.  PCN allergy listed dated in 2016. Patient had CTX earlier today and there were no noted allergic reactions, per ED nurse. Per protocol consult, if patient is able to take cephalosporins then can order cefepime and azithromycin.   Baseline Scr (mg/dl): 1.25 (04/23/2019)  Plan: 1. Will order cefepime IV 2g Q24H (CrCl 11.5 mL/min)  2. Will order azithromycin PO 500 mg x1, then 250 mg thereafter.     Height: 5\' 5"  (165.1 cm) Weight: 118 lb (53.5 kg) IBW/kg (Calculated) : 57  Temp (24hrs), Avg:97.8 F (36.6 C), Min:97.8 F (36.6 C), Max:97.8 F (36.6 C)  Recent Labs  Lab 07/01/19 0506  WBC 23.5*  CREATININE 2.15*  LATICACIDVEN 3.3*    Estimated Creatinine Clearance: 11.5 mL/min (A) (by C-G formula based on SCr of 2.15 mg/dL (H)).    Allergies  Allergen Reactions  . Penicillins Rash    Did it involve swelling of the face/tongue/throat, SOB, or low BP? No Did it involve sudden or severe rash/hives, skin peeling, or any reaction on the inside of your mouth or nose? No Did you need to seek medical attention at a hospital or doctor's office? No When did it last happen?10 Years If all above answers are "NO", may proceed with cephalosporin use.      Thank you for allowing pharmacy to be a part of this patient's care.  Rowland Lathe 07/01/2019 7:50 AM

## 2019-07-01 NOTE — Consult Note (Signed)
Urology Consult  I have been asked to see the patient by Dr. Kristin Bruins, for evaluation and management of right ureteral stone.  Chief Complaint: Right flank pain  History of Present Illness: Sandy Roberson is a 86 y.o. year old female with a personal history of nephrolithiasis presenting to the emergency room with right flank pain, UTI, marked leukocytosis AKI,  found to have an obstructing right distal ureteral calculus, hydronephrosis and elevated lactate consistent with sepsis of urinary source.  Patient resolve is somewhat a limited historian.  She is accompanied today by her son at the bedside.  He reports that she has been complaining of right-sided pain for a few days, possibly up to a week but initially they were concerned it was related to a fall.  Now in retrospect, it was likely the stone all along.  She is also been vomiting, increased confusion and weakness which has progressed over the past several days.  She does have a personal history of nephrolithiasis requiring urgent ureteral stent about 10 years ago.  She is currently n.p.o.   Past Medical History:  Diagnosis Date  . Coronary artery disease   . Hyperlipidemia   . Hypertension   . Renal disorder     Past Surgical History:  Procedure Laterality Date  . CORONARY STENT PLACEMENT    . ESOPHAGOGASTRODUODENOSCOPY (EGD) WITH PROPOFOL N/A 10/20/2018   Procedure: ESOPHAGOGASTRODUODENOSCOPY (EGD) WITH PROPOFOL;  Surgeon: Lucilla Lame, MD;  Location: [facility] ENDOSCOPY;  Service: Endoscopy;  Laterality: N/A;  . FRACTURE SURGERY    . LOWER EXTREMITY ANGIOGRAPHY Right 11/24/2018   Procedure: LOWER EXTREMITY ANGIOGRAPHY;  Surgeon: Algernon Huxley, MD;  Location: [facility];  Service: Cardiovascular;  Laterality: Right;  . LOWER EXTREMITY ANGIOGRAPHY Right 01/05/2019   Procedure: LOWER EXTREMITY ANGIOGRAPHY;  Surgeon: Algernon Huxley, MD;  Location: [facility];  Service: Cardiovascular;  Laterality: Right;  .  LOWER EXTREMITY ANGIOGRAPHY Left 03/20/2019   Procedure: LOWER EXTREMITY ANGIOGRAPHY;  Surgeon: Algernon Huxley, MD;  Location: [place] [facility];  Service: Cardiovascular;  Laterality: Left;  . LOWER EXTREMITY ANGIOGRAPHY Left 04/23/2019   Procedure: LOWER EXTREMITY ANGIOGRAPHY;  Surgeon: Algernon Huxley, MD;  Location: [facility];  Service: Cardiovascular;  Laterality: Left;  . STENT PLACEMENT RT URETER [facility] H[facility])     since been removed   . TRANSMETATARSAL AMPUTATION Right 02/26/2019   Procedure: 1st METATARSAL RESECTION;  Surgeon: Albertine Patricia, DPM;  Location: [facility];  Service: Podiatry;  Laterality: Right;    Home Medications:  Current Meds  Medication Sig  . aspirin (ASPIRIN 81) 81 MG chewable tablet Chew 1 tablet (81 mg total) by mouth daily.  . calcitRIOL (ROCALTROL) 0.25 MCG capsule Take 0.25 mcg by mouth daily.   . cefdinir (OMNICEF) 300 MG capsule Take 300 mg by mouth 2 (two) times daily.  Marland Kitchen trimethoprim (TRIMPEX) 100 MG tablet Take 1 tablet (100 mg total) by mouth daily.    Allergies:  Allergies  Allergen Reactions  . Penicillins Rash    Did it involve swelling of the face/tongue/throat, SOB, or low BP? No Did it involve sudden or severe rash/hives, skin peeling, or any reaction on the inside of your mouth or nose? No Did you need to seek medical attention at a hospital or doctor's office? No When did it last happen?10 Years If all above answers are "NO", may proceed with cephalosporin use.    Family History  Problem Relation Age of Onset  .  Heart failure Neg Hx     Social History:  reports that she has never smoked. She has never used smokeless tobacco. She reports that she does not drink alcohol or use drugs.  ROS: A complete review of systems was performed.  All systems are negative except for pertinent findings as noted.  Physical Exam:  Vital signs in last 24 hours: Temp:  [96.9 F (36.1 C)-97.8 F (36.6 C)] 96.9 F (36.1 C) (03/17  1016) Pulse Rate:  [90-107] 90 (03/17 1016) Resp:  [18-28] 22 (03/17 1016) BP: (92-110)/(56-76) 92/56 (03/17 1016) SpO2:  [94 %-99 %] 96 % (03/17 1016) Weight:  [53.5 kg] 53.5 kg (03/17 0434) Constitutional:  Alert, nonverbal.  Responds to stimuli. HEENT: [place] AT, moist mucus membranes.  Trachea midline, no masses Respiratory: Normal respiratory effort, no increased work of breathing Neurologic: Grossly intact, no focal deficits, moving all 4 extremities Psychiatric: Normal mood and affect   Laboratory Data:  Recent Labs    07/01/19 0506  WBC 23.5*  HGB 10.5*  HCT 31.6*   Recent Labs    07/01/19 0506  NA 137  K 4.4  CL 102  CO2 24  GLUCOSE 142*  BUN 65*  CREATININE 2.15*  CALCIUM 9.5   No results for input(s): LABPT, INR in the last 72 hours. No results for input(s): LABURIN in the last 72 hours. Results for orders placed or performed during the hospital encounter of 07/01/19  Respiratory Panel by RT PCR (Flu A&B, Covid) - Nasopharyngeal Swab     Status: None   Collection Time: 07/01/19  5:06 AM   Specimen: Nasopharyngeal Swab  Result Value Ref Range Status   SARS Coronavirus 2 by RT PCR NEGATIVE NEGATIVE Final    Comment: (NOTE) SARS-CoV-2 target nucleic acids are NOT DETECTED. The SARS-CoV-2 RNA is generally detectable in upper respiratoy specimens during the acute phase of infection. The lowest concentration of SARS-CoV-2 viral copies this assay can detect is 131 copies/mL. A negative result does not preclude SARS-Cov-2 infection and should not be used as the sole basis for treatment or other patient management decisions. A negative result may occur with  improper specimen collection/handling, submission of specimen other than nasopharyngeal swab, presence of viral mutation(s) within the areas targeted by this assay, and inadequate number of viral copies (<131 copies/mL). A negative result must be combined with clinical observations, patient history, and  epidemiological information. The expected result is Negative. Fact Sheet for Patients:  PinkCheek.be Fact Sheet for Healthcare Providers:  GravelBags.it This test is not yet ap proved or cleared by the [place] FDA and  has been authorized for detection and/or diagnosis of SARS-CoV-2 by FDA under an Emergency Use Authorization (EUA). This EUA will remain  in effect (meaning this test can be used) for the duration of the COVID-19 declaration under Section 564(b)(1) of the Act, 21 U.S.C. section 360bbb-3(b)(1), unless the authorization is terminated or revoked sooner.    Influenza A by PCR NEGATIVE NEGATIVE Final   Influenza B by PCR NEGATIVE NEGATIVE Final    Comment: (NOTE) The Xpert Xpress SARS-CoV-2/FLU/RSV assay is intended as an aid in  the diagnosis of influenza from Nasopharyngeal swab specimens and  should not be used as a sole basis for treatment. Nasal washings and  aspirates are unacceptable for Xpert Xpress SARS-CoV-2/FLU/RSV  testing. Fact Sheet for Patients: PinkCheek.be Fact Sheet for Healthcare Providers: GravelBags.it This test is not yet approved or cleared by the [place] and  has been authorized for  detection and/or diagnosis of SARS-CoV-2 by  FDA under an Emergency Use Authorization (EUA). This EUA will remain  in effect (meaning this test can be used) for the duration of the  Covid-19 declaration under Section 564(b)(1) of the Act, 21  U.S.C. section 360bbb-3(b)(1), unless the authorization is  terminated or revoked. Performed at [facility], [address] [place], [place] [address]   Blood Culture (routine x 2)     Status: None (Preliminary result)   Collection Time: 07/01/19  5:51 AM   Specimen: BLOOD  Result Value Ref Range Status   Specimen Description BLOOD LEFT FA  Final   Special Requests   Final    BOTTLES  DRAWN AEROBIC AND ANAEROBIC Blood Culture results may not be optimal due to an inadequate volume of blood received in culture bottles   Culture   Final    NO GROWTH < 12 HOURS Performed at [facility], [address], [place], [place] [address]    Report Status PENDING  Incomplete  Blood Culture (routine x 2)     Status: None (Preliminary result)   Collection Time: 07/01/19  5:51 AM   Specimen: BLOOD  Result Value Ref Range Status   Specimen Description BLOOD RIGHT [facility]  Final   Special Requests   Final    BOTTLES DRAWN AEROBIC AND ANAEROBIC Blood Culture adequate volume   Culture   Final    NO GROWTH < 12 HOURS Performed at [facility], [address], [place], [place] [address]    Report Status PENDING  Incomplete     Radiologic Imaging: CT ABDOMEN PELVIS WO CONTRAST  Result Date: 07/01/2019 CLINICAL DATA:  Flank pain with kidney stone suspected.  Hip trauma EXAM: CT CHEST, ABDOMEN AND PELVIS WITHOUT CONTRAST TECHNIQUE: Multidetector CT imaging of the chest, abdomen and pelvis was performed following the standard protocol without IV contrast. COMPARISON:  None. FINDINGS: CT CHEST FINDINGS Cardiovascular: Cardiomegaly. No pericardial effusion. Extensive aortic and coronary atherosclerosis. Mediastinum/Nodes: Nodular goiter with large left lobe nodule measuring ~ 6 cm and extending into the superior mediastinum with tracheal mass effect. No tracheal narrowing or regional invasive features. Patulous esophagus without apparent wall thickening. No adenopathy Lungs/Pleura: Trace pleural effusions. Atelectasis in the lower lungs. No pneumothorax or consolidation. Musculoskeletal: Healing anterior right fourth rib fracture. Diffuse degenerative disc narrowing and endplate ridging. CT ABDOMEN PELVIS FINDINGS Hepatobiliary: No focal liver abnormality.No evidence of biliary obstruction or stone. Pancreas: Unremarkable. Spleen: Unremarkable. Adrenals/Urinary Tract: Negative  adrenals. Marked right hydroureteronephrosis due to a 8 x 5 mm stone just before the right UVJ. There are 2 additional large right renal calculi measuring up to 12 mm. Atrophic left kidney. Unremarkable bladder. Stomach/Bowel: No obstruction. Extensive distal colonic diverticulosis. No evidence of bowel inflammation. Vascular/Lymphatic: Diffuse atherosclerotic calcification. No mass or adenopathy. Reproductive:No pathologic findings. Other: No ascites or pneumoperitoneum. Musculoskeletal: Advanced lumbar spine degeneration with mild levoscoliosis. IMPRESSION: 1. High-grade urinary obstruction on the right due to a 8 x 5 mm stone just before the UVJ. 2. Two additional large right renal calculi measuring up to 12 mm. 3. History of respiratory illness but only minor atelectasis and trace pleural fluid. Negative for pneumonia or edema. 4. Nodular goiter with mediastinal extension and tracheal mass effect. 5. Gallstones or sludge. Electronically Signed   By: Monte Fantasia M.D.   On: 07/01/2019 07:47   CT Chest Wo Contrast  Result Date: 07/01/2019 CLINICAL DATA:  Flank pain with kidney stone suspected.  Hip trauma EXAM:  CT CHEST, ABDOMEN AND PELVIS WITHOUT CONTRAST TECHNIQUE: Multidetector CT imaging of the chest, abdomen and pelvis was performed following the standard protocol without IV contrast. COMPARISON:  None. FINDINGS: CT CHEST FINDINGS Cardiovascular: Cardiomegaly. No pericardial effusion. Extensive aortic and coronary atherosclerosis. Mediastinum/Nodes: Nodular goiter with large left lobe nodule measuring ~ 6 cm and extending into the superior mediastinum with tracheal mass effect. No tracheal narrowing or regional invasive features. Patulous esophagus without apparent wall thickening. No adenopathy Lungs/Pleura: Trace pleural effusions. Atelectasis in the lower lungs. No pneumothorax or consolidation. Musculoskeletal: Healing anterior right fourth rib fracture. Diffuse degenerative disc narrowing and  endplate ridging. CT ABDOMEN PELVIS FINDINGS Hepatobiliary: No focal liver abnormality.No evidence of biliary obstruction or stone. Pancreas: Unremarkable. Spleen: Unremarkable. Adrenals/Urinary Tract: Negative adrenals. Marked right hydroureteronephrosis due to a 8 x 5 mm stone just before the right UVJ. There are 2 additional large right renal calculi measuring up to 12 mm. Atrophic left kidney. Unremarkable bladder. Stomach/Bowel: No obstruction. Extensive distal colonic diverticulosis. No evidence of bowel inflammation. Vascular/Lymphatic: Diffuse atherosclerotic calcification. No mass or adenopathy. Reproductive:No pathologic findings. Other: No ascites or pneumoperitoneum. Musculoskeletal: Advanced lumbar spine degeneration with mild levoscoliosis. IMPRESSION: 1. High-grade urinary obstruction on the right due to a 8 x 5 mm stone just before the UVJ. 2. Two additional large right renal calculi measuring up to 12 mm. 3. History of respiratory illness but only minor atelectasis and trace pleural fluid. Negative for pneumonia or edema. 4. Nodular goiter with mediastinal extension and tracheal mass effect. 5. Gallstones or sludge. Electronically Signed   By: Monte Fantasia M.D.   On: 07/01/2019 07:47   CT Hip Right Wo Contrast  Result Date: 07/01/2019 CLINICAL DATA:  Hip fracture suspected and negative x-ray. EXAM: CT OF THE RIGHT HIP WITHOUT CONTRAST TECHNIQUE: Multidetector CT imaging of the right hip was performed according to the standard protocol. Multiplanar CT image reconstructions were also generated. COMPARISON:  Radiography from earlier today FINDINGS: Bones/Joint/Cartilage Remote lower sacral fracture with mild, healed displacement. No acute fracture or dislocation. Ligaments Suboptimally assessed by CT. Muscles and Tendons No evidence of swelling or disruption. Soft tissues Mild subcutaneous reticulation over the coccyx, usually pressure related. IMPRESSION: Negative for acute fracture.  Electronically Signed   By: Monte Fantasia M.D.   On: 07/01/2019 07:49   DG Chest Port 1 View  Result Date: 07/01/2019 CLINICAL DATA:  Sepsis EXAM: PORTABLE CHEST 1 VIEW COMPARISON:  Radiograph 10/18/2018 FINDINGS: Mixed interstitial and patchy opacities in both lungs. Suspect a trace left effusion. No pneumothorax. No right effusion. Cardiomegaly with a calcified tortuous aorta is similar to prior. Degenerative changes are present in the imaged spine and shoulders. The osseous structures appear diffusely demineralized which may limit detection of small or nondisplaced fractures. No acute osseous or soft tissue abnormality. Remote posttraumatic deformity left humerus. Telemetry leads overlie the chest. IMPRESSION: Mixed interstitial and patchy opacities in both lungs and likely trace left effusion. Given the basilar predominance and patient history, findings could reflect aspiration or infection. Electronically Signed   By: Lovena Le M.D.   On: 07/01/2019 05:50   DG OR UROLOGY CYSTO IMAGE ([place])  Result Date: 07/01/2019 There is no interpretation for this exam.  This order is for images obtained during a surgical procedure.  Please See "Surgeries" Tab for more information regarding the procedure.   DG HIP UNILAT WITH PELVIS 2-3 VIEWS RIGHT  Result Date: 07/01/2019 CLINICAL DATA:  Fall a few days ago with right hip pain EXAM:  DG HIP (WITH OR WITHOUT PELVIS) 2-3V RIGHT COMPARISON:  None. FINDINGS: There is no evidence of hip fracture or dislocation. No focal bone lesion. Generalized osteopenia. Atherosclerotic calcification. Prominent lumbar disc degeneration. IMPRESSION: No acute finding. Electronically Signed   By: Monte Fantasia M.D.   On: 07/01/2019 05:41   Above CT scan was personally reviewed today.  Agree with radiologic interpretation.  Possible impaction of the right distal ureteral calculus with high-grade ureteral obstruction.  Impression/Plan:  86 year old female with  obstructing right distal ureteral calculus with signs symptoms of sepsis of urinary source.  In the setting of obstruction and infection, recommended cystoscopy with ureteral stent placement primarily for source control.  Despite her advanced age, she is relatively functional at baseline and lives at home with her son.  They are interested in pursuing surgical intervention.  She is already received broad-spectrum antibiotics with cultures pending.  In addition as above, acute kidney injury related to obstruction and sepsis.  Should improve with ureteral stenting and source control.  We discussed right ureteral stent placement today in detail.  The risk of worsening urinary sepsis, stent pain, need for staged procedure for definitive management of the stone, ureteral injury, and possible failure especially given the possibility of impacted stone were all discussed in detail.  They are agreeable this plan.  All questions answered.  We will defer all further care to hospitalist for postoperative support.   07/01/2019, 10:33 AM  Hollice Espy,  MD

## 2019-07-01 NOTE — Anesthesia Procedure Notes (Signed)
Procedure Name: Intubation Date/Time: 07/01/2019 10:53 AM Performed by: Caryl Asp, CRNA Pre-anesthesia Checklist: Patient identified, Patient being monitored, Timeout performed, Emergency Drugs available and Suction available Patient Re-evaluated:Patient Re-evaluated prior to induction Oxygen Delivery Method: Circle system utilized Preoxygenation: Pre-oxygenation with 100% oxygen Induction Type: Rapid sequence Ventilation: Mask ventilation without difficulty Laryngoscope Size: 3 and McGraph Grade View: Grade I Tube type: Oral Tube size: 7.0 mm Number of attempts: 1 Airway Equipment and Method: Stylet and Video-laryngoscopy Placement Confirmation: ETT inserted through vocal cords under direct vision,  positive ETCO2 and breath sounds checked- equal and bilateral Secured at: 21 cm Tube secured with: Tape Dental Injury: Teeth and Oropharynx as per pre-operative assessment

## 2019-07-01 NOTE — ED Triage Notes (Signed)
Pt accomp by son who reports pt had fall few days ago and c/o persistent rt hip; st lost her balance when ambulating with her walker; also reports N/V since yesterday, has "issues with her esophagus"

## 2019-07-01 NOTE — ED Notes (Signed)
Patient transported to XRAY 

## 2019-07-01 NOTE — Progress Notes (Signed)
CODE SEPSIS - PHARMACY COMMUNICATION  **Broad Spectrum Antibiotics should be administered within 1 hour of Sepsis diagnosis**  Time Code Sepsis Called/Page Received: 7195  Antibiotics Ordered: Rocephin, Flagyl and Vancomycin  Time of 1st antibiotic administration: 0630, Rocephin  Additional action taken by pharmacy: n/a  If necessary, Name of Provider/Nurse Contacted: n/a    Ena Dawley ,PharmD Clinical Pharmacist  07/01/2019  5:36 AM

## 2019-07-01 NOTE — Transfer of Care (Signed)
Immediate Anesthesia Transfer of Care Note  Patient: Fantasha Smucker  Procedure(s) Performed: CYSTOSCOPY WITH RETROGRADE PYELOGRAM/URETERAL STENT PLACEMENT (Right )  Patient Location: PACU  Anesthesia Type:General  Level of Consciousness: awake and alert   Airway & Oxygen Therapy: Patient Spontanous Breathing and Patient connected to face mask oxygen  Post-op Assessment: Report given to RN and Post -op Vital signs reviewed and stable  Post vital signs: Reviewed and stable  Last Vitals:  Vitals Value Taken Time  BP 101/58 07/01/19 1125  Temp    Pulse 88 07/01/19 1129  Resp 21 07/01/19 1129  SpO2 100 % 07/01/19 1129  Vitals shown include unvalidated device data.  Last Pain:  Vitals:   07/01/19 1016  TempSrc: Temporal         Complications: No apparent anesthesia complications

## 2019-07-01 NOTE — ED Provider Notes (Signed)
[facility] Emergency Department Provider Note  ____________________________________________   First MD Initiated Contact with Patient 07/01/19 [phone]     (approximate)  I have reviewed the triage vital signs and the nursing notes.   HISTORY  Chief Complaint Hip Pain and Emesis  Level 5 caveat: History is limited by the patient's age and difficulty hearing.  Most of the history is provided by her son.  HPI Sandy Roberson is a 86 y.o. female with medical history as listed below but who is surprisingly functional for her age, according to her son who is at bedside.  For the last several days she has had increasing difficulty with vomiting.  He reports that she has a history of esophageal issues and sometimes feels like things get caught in her throat but she has not been able to eat or drink much of anything for the last several days.  Even trying to take her medications including aspirin and the Xanax which she is prescribed by her PCP has resulted in immediate vomiting.  He is concerned she may be dehydrated.  She also seems increasingly weak and somewhat confused compared to baseline.  Additionally he is concerned because she had a fall several days ago and she has been reporting severe pain in her hip.  It is worse when she moves around.  She is still able to ambulate somewhat but certain movements seem to hurt worse than others.  It is up in her upper hip or pelvis.  He reports that is difficult to identify exactly the source of the pain or what she is feeling because he says that she reacts "like a 15-year-old" and cries out whenever she feels pain, and that she forgets that she had a fall and that she was hurting previously.   I asked her how she is feeling, she states "I could be better."  When I ask her what hurts, she indicates her right hip.  She denies feeling short of breath and denies abdominal pain.        Past Medical History:  Diagnosis Date    . Coronary artery disease   . Hyperlipidemia   . Hypertension   . Renal disorder     Patient Active Problem List   Diagnosis Date Noted  . Foot infection 02/25/2019  . Cellulitis 10/29/2018  . Food impaction of esophagus   . Stricture and stenosis of esophagus   . Esophageal obstruction due to food impaction 10/19/2018  . Atherosclerosis of native arteries of the extremities with ulceration ([place]) 03/28/2018  . Chronic kidney disease 03/11/2018  . CKD (chronic kidney disease) stage 3, GFR 30-59 ml/min 03/11/2018  . Coronary artery disease 03/11/2018  . Hyperlipidemia 03/11/2018  . Hypertension 03/11/2018  . Thyroid disease 03/11/2018  . SOB (shortness of breath) 04/05/2016  . Flu vaccine need 02/17/2016  . H/O dehydration 08/12/2015  . ITP (idiopathic thrombocytopenic purpura) 09/25/2014  . ARF (acute renal failure) ([place]) 09/25/2014  . Hypokalemia 09/25/2014  . Acute diastolic CHF (congestive heart failure) ([place]) 09/25/2014  . Sepsis due to urinary tract infection ([place]) 09/19/2014  . Hyponatremia 09/19/2014  . Kidney stone 11/12/2012  . Urge incontinence 11/12/2012  . Urinary tract infection, site not specified 11/12/2012    Past Surgical History:  Procedure Laterality Date  . CORONARY STENT PLACEMENT    . ESOPHAGOGASTRODUODENOSCOPY (EGD) WITH PROPOFOL N/A 10/20/2018   Procedure: ESOPHAGOGASTRODUODENOSCOPY (EGD) WITH PROPOFOL;  Surgeon: Lucilla Lame, MD;  Location: [facility] ENDOSCOPY;  Service: Endoscopy;  Laterality:  N/A;  . FRACTURE SURGERY    . LOWER EXTREMITY ANGIOGRAPHY Right 11/24/2018   Procedure: LOWER EXTREMITY ANGIOGRAPHY;  Surgeon: Algernon Huxley, MD;  Location: [place] [facility];  Service: Cardiovascular;  Laterality: Right;  . LOWER EXTREMITY ANGIOGRAPHY Right 01/05/2019   Procedure: LOWER EXTREMITY ANGIOGRAPHY;  Surgeon: Algernon Huxley, MD;  Location: [place] [facility];  Service: Cardiovascular;  Laterality: Right;  . LOWER EXTREMITY ANGIOGRAPHY Left 03/20/2019    Procedure: LOWER EXTREMITY ANGIOGRAPHY;  Surgeon: Algernon Huxley, MD;  Location: [place] [facility];  Service: Cardiovascular;  Laterality: Left;  . LOWER EXTREMITY ANGIOGRAPHY Left 04/23/2019   Procedure: LOWER EXTREMITY ANGIOGRAPHY;  Surgeon: Algernon Huxley, MD;  Location: [place] [facility];  Service: Cardiovascular;  Laterality: Left;  . STENT PLACEMENT RT [facility] HX)     since been removed   . TRANSMETATARSAL AMPUTATION Right 02/26/2019   Procedure: 1st METATARSAL RESECTION;  Surgeon: Albertine Patricia, DPM;  Location: [facility] ORS;  Service: Podiatry;  Laterality: Right;    Prior to Admission medications   Medication Sig Start Date End Date Taking? Authorizing Provider  aspirin (ASPIRIN 81) 81 MG chewable tablet Chew 1 tablet (81 mg total) by mouth daily. 02/28/19   Kayleen Memos, DO  atorvastatin (LIPITOR) 10 MG tablet Take 1 tablet (10 mg total) by mouth daily. Patient not taking: Reported on 05/21/2019 01/05/19 01/05/20  Algernon Huxley, MD  calcitRIOL (ROCALTROL) 0.25 MCG capsule Take 0.25 mcg by mouth daily.  03/02/19   [provider]  carvedilol (COREG) 3.125 MG tablet Take 1 tablet (3.125 mg total) by mouth 2 (two) times daily with a meal. Patient not taking: Reported on 05/21/2019 09/25/14   Gladstone Lighter, MD  Cholecalciferol (VITAMIN D3) 50 MCG (2000 UT) TABS Take 2,000 Units by mouth daily.     [provider]  ciprofloxacin (CIPRO) 250 MG tablet Take 250 mg by mouth 2 (two) times daily.    [provider]  clopidogrel (PLAVIX) 75 MG tablet Take 1 tablet (75 mg total) by mouth daily. Patient not taking: Reported on 05/21/2019 11/24/18   Algernon Huxley, MD  doxycycline (VIBRA-TABS) 100 MG tablet Take 100 mg by mouth 2 (two) times daily. 03/04/19   [provider]  methimazole (TAPAZOLE) 5 MG tablet Take 5 mg by mouth daily.     [provider]  trimethoprim (TRIMPEX) 100 MG tablet Take 1 tablet (100 mg total) by mouth daily. 02/28/19   Kayleen Memos, DO    Allergies Penicillins  Family History  Problem Relation Age of Onset  . Heart failure Neg Hx     Social History Social History   Tobacco Use  . Smoking status: Never Smoker  . Smokeless tobacco: Never Used  Substance Use Topics  . Alcohol use: No  . Drug use: Never    Review of Systems Level 5 caveat: History is limited by the patient's age and difficulty hearing.  Most of the history is provided by her son.  The patient and her son report right hip pain after recent fall.  The patient's son reports persistent and worsening vomiting over several days with decreased oral intake, some increased confusion, and concern for dehydration.   ____________________________________________   PHYSICAL EXAM:  VITAL SIGNS: ED Triage Vitals  Enc Vitals Group     BP 07/01/19 0445 110/62     Pulse Rate 07/01/19 0445 (!) 107     Resp 07/01/19 0445 (!) 28  Temp 07/01/19 0445 97.8 F (36.6 C)     Temp Source 07/01/19 0445 Oral     SpO2 07/01/19 0445 96 %     Weight 07/01/19 0434 53.5 kg (118 lb)     Height 07/01/19 0434 1.651 m (5\' 5" )     Head Circumference --      Peak Flow --      Pain Score --      Pain Loc --      Pain Edu? --      Excl. in [place]? --     Constitutional: Alert, hard of hearing, elderly but appears younger than chronological age.  Somewhat agitated and uncomfortable but able to follow instructions and answer simple questions. Eyes: Conjunctivae are normal.  Head: Atraumatic. Nose: No congestion/rhinnorhea. Mouth/Throat: Patient is wearing a mask. Neck: No stridor.  No meningeal signs.   Cardiovascular: Mild tachycardia, regular rhythm. Good peripheral circulation. Grossly normal heart sounds. Respiratory: Normal respiratory effort.  No retractions. Gastrointestinal: Soft and nontender. No distention.  Musculoskeletal: No lower extremity tenderness nor edema. No gross deformities of extremities. Neurologic:  Normal speech and language. No  gross focal neurologic deficits are appreciated.  Skin: She has several chronic wounds for which she sees the wound care center weekly.  These are on her right and left lower extremities including a more acute wound on her left foot which was just recently cultured and for which her wound care provider started her on cefdinir.  There is no surrounding cellulitis on any of these wounds but there is purulence particularly on the left foot wound which is open and wet.  The wound on her right foot appears to be healing although there is a small amount of purulence present as well.   ____________________________________________   LABS (all labs ordered are listed, but only abnormal results are displayed)  Labs Reviewed  CBC WITH DIFFERENTIAL/PLATELET - Abnormal; Notable for the following components:      Result Value   WBC 23.5 (*)    RBC 3.41 (*)    Hemoglobin 10.5 (*)    HCT 31.6 (*)    RDW 15.9 (*)    Neutro Abs 22.5 (*)    Lymphs Abs 0.2 (*)    Abs Immature Granulocytes 0.19 (*)    All other components within normal limits  LACTIC ACID, PLASMA - Abnormal; Notable for the following components:   Lactic Acid, Venous 3.3 (*)    All other components within normal limits  COMPREHENSIVE METABOLIC PANEL - Abnormal; Notable for the following components:   Glucose, Bld 142 (*)    BUN 65 (*)    Creatinine, Ser 2.15 (*)    Albumin 3.0 (*)    GFR calc non Af Amer 18 (*)    GFR calc Af Amer 21 (*)    All other components within normal limits  URINALYSIS, COMPLETE (UACMP) WITH MICROSCOPIC - Abnormal; Notable for the following components:   Color, Urine YELLOW (*)    APPearance CLOUDY (*)    Hgb urine dipstick LARGE (*)    Protein, ur 100 (*)    Leukocytes,Ua LARGE (*)    RBC / HPF >50 (*)    WBC, UA >50 (*)    Bacteria, UA MANY (*)    All other components within normal limits  TROPONIN I (HIGH SENSITIVITY) - Abnormal; Notable for the following components:   Troponin I (High Sensitivity)  78 (*)    All other components within normal limits  CULTURE, BLOOD (  ROUTINE X 2)  CULTURE, BLOOD (ROUTINE X 2)  URINE CULTURE  RESPIRATORY PANEL BY RT PCR (FLU A&B, COVID)  LIPASE, BLOOD  PROCALCITONIN  LACTIC ACID, PLASMA   ____________________________________________  EKG  ED ECG REPORT I, Hinda Kehr, the attending physician, personally viewed and interpreted this ECG.  Date: 07/01/2019 EKG Time: 4:44 AM Rate: 110 Rhythm: Sinus tachycardia QRS Axis: normal Intervals: Prolonged QTC at 519 ms ST/T Wave abnormalities: Non-specific ST segment / T-wave changes, but no clear evidence of acute ischemia. Narrative Interpretation: no definitive evidence of acute ischemia; does not meet STEMI criteria.   ____________________________________________  RADIOLOGY I, Hinda Kehr, personally viewed and evaluated these images (plain radiographs) as part of my medical decision making, as well as reviewing the written report by the radiologist.  ED MD interpretation: No acute abnormality identified on pelvic and right hip x-rays.  Chest x-ray is consistent with patchy opacities suggestive of atypical pneumonia versus aspiration pneumonia.  Official radiology report(s): DG Chest Port 1 View  Result Date: 07/01/2019 CLINICAL DATA:  Sepsis EXAM: PORTABLE CHEST 1 VIEW COMPARISON:  Radiograph 10/18/2018 FINDINGS: Mixed interstitial and patchy opacities in both lungs. Suspect a trace left effusion. No pneumothorax. No right effusion. Cardiomegaly with a calcified tortuous aorta is similar to prior. Degenerative changes are present in the imaged spine and shoulders. The osseous structures appear diffusely demineralized which may limit detection of small or nondisplaced fractures. No acute osseous or soft tissue abnormality. Remote posttraumatic deformity left humerus. Telemetry leads overlie the chest. IMPRESSION: Mixed interstitial and patchy opacities in both lungs and likely trace left effusion.  Given the basilar predominance and patient history, findings could reflect aspiration or infection. Electronically Signed   By: Lovena Le M.D.   On: 07/01/2019 05:50   DG HIP UNILAT WITH PELVIS 2-3 VIEWS RIGHT  Result Date: 07/01/2019 CLINICAL DATA:  Fall a few days ago with right hip pain EXAM: DG HIP (WITH OR WITHOUT PELVIS) 2-3V RIGHT COMPARISON:  None. FINDINGS: There is no evidence of hip fracture or dislocation. No focal bone lesion. Generalized osteopenia. Atherosclerotic calcification. Prominent lumbar disc degeneration. IMPRESSION: No acute finding. Electronically Signed   By: Monte Fantasia M.D.   On: 07/01/2019 05:41    ____________________________________________   PROCEDURES   Procedure(s) performed (including Critical Care):  .Critical Care Performed by: Hinda Kehr, MD Authorized by: Hinda Kehr, MD   Critical care provider statement:    Critical care time (minutes):  45   Critical care time was exclusive of:  Separately billable procedures and treating other patients   Critical care was necessary to treat or prevent imminent or life-threatening deterioration of the following conditions:  Sepsis and renal failure   Critical care was time spent personally by me on the following activities:  Development of treatment plan with patient or surrogate, discussions with consultants, evaluation of patient's response to treatment, examination of patient, obtaining history from patient or surrogate, ordering and performing treatments and interventions, ordering and review of laboratory studies, ordering and review of radiographic studies, pulse oximetry, re-evaluation of patient's condition and review of old charts .1-3 Lead EKG Interpretation Performed by: Hinda Kehr, MD Authorized by: Hinda Kehr, MD     Interpretation: abnormal     ECG rate:  103   ECG rate assessment: tachycardic     Rhythm: sinus tachycardia     Ectopy: none     Conduction: normal        ____________________________________________   INITIAL IMPRESSION / MDM / ASSESSMENT  AND PLAN / ED COURSE  As part of my medical decision making, I reviewed the following data within the [place] History obtained from family, Nursing notes reviewed and incorporated, Labs reviewed , EKG interpreted , Old chart reviewed, Radiograph reviewed , Discussed with admitting physician  and Notes from prior ED visits   Differential diagnosis includes, but is not limited to, cellulitis, urinary tract infection, community-acquired or aspiration pneumonia, bacteremia, sepsis, occult hip fracture or pelvic fracture, obstructive ureteral stone (which her son says she has had in the past), dehydration, acute renal failure, metabolic or electrolyte abnormalities.  I discussed the plan with the son.  Our initial plan will be to obtain plain films of the right hip and pelvis as well as to obtain some basic labs and provide a small fluid bolus.  He is quite knowledgeable of her medical history and cares for her at home and believes that she may be dehydrated which I think is very possible given her recent history of worsening nausea and vomiting and history of esophageal problems.  Based on the result of her initial blood work which includes procalcitonin and lactic acid as well as metabolic panel and CBC, I will discuss with him the results and we will decide whether we should proceed with additional evaluation.  She is on a prophylactic antibiotic (Bactrim) against urinary tract infections and her wound care center just recently added cefdinir to her regimen for the wound culture from her left foot.  She had no osteomyelitis on recent x-ray of the left foot which is reassuring.  However given her age and comorbidities I am concerned about the possibility of systemic infection.      Clinical Course as of Jul 01 719  Wed Jul 01, 2019  0553 The patient's lab work is coming back very  concerning.  Initially her CBC returned with a leukocytosis of 23.5 with a dramatic left shift.  Her last blood work in the system was from a couple of months ago and her white blood cell count was normal at that time.  This, combined with her mild tachycardia, strongly suggest to me systemic infection/sepsis.  At that point I made her a code sepsis and ordered ceftriaxone 2 g IV and shortly thereafter ordered vancomycin 1 g IV for empiric treatment of cellulitis.  I also ordered lactated Ringer's 30 mL/kg normal saline IV bolus.Shortly thereafter her comprehensive metabolic panel came back demonstrating a creatinine of 2.15 and a BUN of 65.  Given her body mass and a creatinine that was essentially normal previously, this strongly suggest volume depletion and acute renal failure.  Her lactic acid then returned at 3.3, again suggestive sepsis and/or volume depletion.   [CF]  [phone] I had an extensive discussion with the patient's son who identifies himself as the healthcare power of attorney at bedside.  I recommended that we proceed with sepsis protocol including aggressive IV fluids antibiotics and extensive evaluation and I inquired as to her CODE STATUS.  He said that they had had discussions about this before and that sometimes she prays for God to take her.  After we discussed the pros and cons, risks and benefits, of making her DNR, he agreed that that would be best for her.  He understands and was able to speak back to me the implications, that we will do everything we can to treat her including antibiotics and fluids, but if she were to stop breathing or her heart were to stop, we  would not intubate her and put her on a ventilator and we would not perform chest compressions.  He understands and agrees with this plan.  I filled out the [facility] DNR paperwork and put it with her file.   [CF]  [id number] I verified in the medical record that she had a wound culture recently performed on the wound to her left  foot.  She also had x-rays performed on the foot several days ago that showed no sign of osteomyelitis.  X-rays are pending for her chest and her right hip and pelvis.  The nurses will proceed with in and out catheterization for a urinalysis but I suspect that the source of her sepsis is her multiple lower extremity wounds and in particular the wound on her left foot.   [CF]  2878 No acute abnormality identified on hip and pelvis x-rays  DG HIP UNILAT WITH PELVIS 2-3 VIEWS RIGHT [CF]  0558 Chest x-ray demonstrates mixed interstitial and patchy opacities in both lungs with some trace left effusion.  The radiologist suspects that this represents either aspiration or nonspecific infection.  Given that she has known esophageal issues and has been vomiting I think aspiration is the most likely.  I will broaden her antibiotics with Flagyl given the possibility of aspiration.   [CF]  0602 Patient is currently stable enough to go to the CT scanner.  I have ordered noncontrast studies of the chest, abdomen/pelvis, and her right hip to evaluate respectively for the aspiration versus atypical pneumonia, the possibility of an obstructive ureteral stone in the setting of a UTI like she has had in the past, and a occult fracture of the right hip.   [CF]  [id number] Severely elevated procalcitonin again suggestive of sepsis  Procalcitonin: 6.98 [CF]  0628 Grossly positive UA, already treating empirically with ceftriaxone 2 g IV for possible cellulitis as well.  Urinalysis, Complete w Microscopic(!) [CF]  [id number] Likely demand from sepsis  Troponin I (High Sensitivity)(!): 78 [CF]  [id number] I consulted the hospitalist team for admission.   [CF]  [phone] Spoke by phone with Dr. Francine Graven with the hospitalist service.  We discussed the case in detail and she will admit the patient.   [CF]  0719 Given the patient's sepsis, I have reassessed her.  She remains mildly tachycardic but is normotensive with no significant clinical status  change during her stay in the emergency department.   [CF]    Clinical Course User Index [CF] Hinda Kehr, MD     ____________________________________________  FINAL CLINICAL IMPRESSION(S) / ED DIAGNOSES  Final diagnoses:  Sepsis with acute renal failure without septic shock, due to unspecified organism, unspecified acute renal failure type ([place])  Acute renal failure, unspecified acute renal failure type ([place])  Skin ulcer, unspecified ulcer stage ([facility])  Right hip pain  Atypical pneumonia  Elevated troponin level     MEDICATIONS GIVEN DURING THIS VISIT:  Medications  lactated ringers bolus 1,000 mL (0 mLs Intravenous Stopped 07/01/19 0701)    And  lactated ringers bolus 500 mL (500 mLs Intravenous New Bag/Given 07/01/19 0701)    And  lactated ringers bolus 250 mL (has no administration in time range)  cefTRIAXone (ROCEPHIN) 2 g in sodium chloride 0.9 % 100 mL IVPB (0 g Intravenous Stopped 07/01/19 0656)  vancomycin (VANCOCIN) IVPB 1000 mg/200 mL premix (1,000 mg Intravenous New Bag/Given 07/01/19 0700)  metroNIDAZOLE (FLAGYL) IVPB 500 mg (500 mg Intravenous New Bag/Given 07/01/19 0634)  vancomycin variable dose per unstable renal function (pharmacist  dosing) (has no administration in time range)     ED Discharge Orders    None      *Please note:  Sandy Roberson was evaluated in Emergency Department on 07/01/2019 for the symptoms described in the history of present illness. She was evaluated in the context of the global COVID-19 pandemic, which necessitated consideration that the patient might be at risk for infection with the SARS-CoV-2 virus that causes COVID-19. Institutional protocols and algorithms that pertain to the evaluation of patients at risk for COVID-19 are in a state of rapid change based on information released by regulatory bodies including the CDC and federal and state organizations. These policies and algorithms were followed during the patient's care in the  ED.  Some ED evaluations and interventions may be delayed as a result of limited staffing during the pandemic.*  Note:  This document was prepared using Dragon voice recognition software and may include unintentional dictation errors.   Hinda Kehr, MD 07/01/19 [phone]

## 2019-07-01 NOTE — H&P (Signed)
History and Physical    Sandy Roberson WJX:[id number] DOB: 1916/04/15 DOA: 07/01/2019  PCP: Tracie Harrier, MD   Patient coming from: Home  I have personally briefly reviewed patient's old medical records in [place]  Chief Complaint: Weakness                                Right hip pain  HPI: Sandy Roberson is a 86 y.o. 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  She lives at home with her son and is surprisingly functional for her age.  He brought her to the emergency room for evaluation of vomiting and inability to tolerate any oral intake.  He notes that any time she eats or drinks something it gets caught in her throat resulting in immediate vomiting.  He was concerned that she may be dehydrated.  Patient son also states that she fell several days prior to coming to the hospital and has been complaining of severe pain in her right hip.  Pain is worse movement.  Per patient's son she is still able to ambulate but certain movements elicit pain in the right hip. He also notes that she has been increasingly weak and appears more confused when compared to her baseline. Unable to do a review of systems due to patient's mental status.  ED Course: Patient was seen and evaluated in the emergency room with an initial plan to obtain images of the pelvis and right hip to rule out an acute fracture. Labs were ordered and returned with marked leukocytosis worsening of her renal function from baseline (1.25 >> 2.15).  Lactate was elevated at 3.3.  Chest x-ray was suggestive of possible aspiration pneumonia and she has significant pyuria. She received sepsis IV fluid bolus, IV Rocephin, vancomycin and a dose of metronidazole Decision was made to  admit to the hospital for sepsis  Review of Systems: As per HPI otherwise 10 point review of systems negative.    Past Medical History:  Diagnosis Date  . Coronary artery disease   . Hyperlipidemia   . Hypertension   . Renal disorder     Past Surgical History:  Procedure Laterality Date  . CORONARY STENT PLACEMENT    . ESOPHAGOGASTRODUODENOSCOPY (EGD) WITH PROPOFOL N/A 10/20/2018   Procedure: ESOPHAGOGASTRODUODENOSCOPY (EGD) WITH PROPOFOL;  Surgeon: Lucilla Lame, MD;  Location: [facility] ENDOSCOPY;  Service: Endoscopy;  Laterality: N/A;  . FRACTURE SURGERY    . LOWER EXTREMITY ANGIOGRAPHY Right 11/24/2018   Procedure: LOWER EXTREMITY ANGIOGRAPHY;  Surgeon: Algernon Huxley, MD;  Location: [place] [facility];  Service: Cardiovascular;  Laterality: Right;  . LOWER EXTREMITY ANGIOGRAPHY Right 01/05/2019   Procedure: LOWER EXTREMITY ANGIOGRAPHY;  Surgeon: Algernon Huxley, MD;  Location: [place] [facility];  Service: Cardiovascular;  Laterality: Right;  . LOWER EXTREMITY ANGIOGRAPHY Left 03/20/2019   Procedure: LOWER EXTREMITY ANGIOGRAPHY;  Surgeon: Algernon Huxley, MD;  Location: [place] [facility];  Service: Cardiovascular;  Laterality: Left;  . LOWER EXTREMITY ANGIOGRAPHY Left 04/23/2019   Procedure: LOWER EXTREMITY ANGIOGRAPHY;  Surgeon: Algernon Huxley, MD;  Location: [place] [facility];  Service: Cardiovascular;  Laterality: Left;  . STENT PLACEMENT RT [facility] HX)     since been removed   . TRANSMETATARSAL AMPUTATION Right 02/26/2019  Procedure: 1st METATARSAL RESECTION;  Surgeon: Albertine Patricia, DPM;  Location: [facility];  Service: Podiatry;  Laterality: Right;     reports that she has never smoked. She has never used smokeless tobacco. She reports that she does not drink alcohol or use drugs.  Allergies  Allergen Reactions  . Penicillins Rash    Did it involve swelling of the face/tongue/throat, SOB, or low BP? No Did it involve sudden or severe rash/hives, skin peeling, or any  reaction on the inside of your mouth or nose? No Did you need to seek medical attention at a hospital or doctor's office? No When did it last happen?10 Years If all above answers are "NO", may proceed with cephalosporin use.    Family History  Problem Relation Age of Onset  . Heart failure Neg Hx      Prior to Admission medications   Medication Sig Start Date End Date Taking? Authorizing Provider  aspirin (ASPIRIN 81) 81 MG chewable tablet Chew 1 tablet (81 mg total) by mouth daily. 02/28/19   Kayleen Memos, DO  atorvastatin (LIPITOR) 10 MG tablet Take 1 tablet (10 mg total) by mouth daily. Patient not taking: Reported on 05/21/2019 01/05/19 01/05/20  Algernon Huxley, MD  calcitRIOL (ROCALTROL) 0.25 MCG capsule Take 0.25 mcg by mouth daily.  03/02/19   [provider]  carvedilol (COREG) 3.125 MG tablet Take 1 tablet (3.125 mg total) by mouth 2 (two) times daily with a meal. Patient not taking: Reported on 05/21/2019 09/25/14   Gladstone Lighter, MD  Cholecalciferol (VITAMIN D3) 50 MCG (2000 UT) TABS Take 2,000 Units by mouth daily.     [provider]  ciprofloxacin (CIPRO) 250 MG tablet Take 250 mg by mouth 2 (two) times daily.    [provider]  clopidogrel (PLAVIX) 75 MG tablet Take 1 tablet (75 mg total) by mouth daily. Patient not taking: Reported on 05/21/2019 11/24/18   Algernon Huxley, MD  doxycycline (VIBRA-TABS) 100 MG tablet Take 100 mg by mouth 2 (two) times daily. 03/04/19   [provider]  methimazole (TAPAZOLE) 5 MG tablet Take 5 mg by mouth daily.     [provider]  trimethoprim (TRIMPEX) 100 MG tablet Take 1 tablet (100 mg total) by mouth daily. 02/28/19   Kayleen Memos, DO    Physical Exam: Vitals:   07/01/19 0700 07/01/19 0730 07/01/19 0800 07/01/19 0830  BP: 104/66 (!) 107/58 (!) 106/58 108/66  Pulse: 100 99 100 (!) 101  Resp: (!) 22 18 (!) 22 (!) 27  Temp:      TempSrc:      SpO2: 97% 98% 99% 99%  Weight:        Height:         Vitals:   07/01/19 0700 07/01/19 0730 07/01/19 0800 07/01/19 0830  BP: 104/66 (!) 107/58 (!) 106/58 108/66  Pulse: 100 99 100 (!) 101  Resp: (!) 22 18 (!) 22 (!) 27  Temp:      TempSrc:      SpO2: 97% 98% 99% 99%  Weight:      Height:        Constitutional: NAD, alert and oriented to person and place. Appears uncomfortable Eyes: PERRL, lids and conjunctivae normal ENMT: Mucous membrane is dry Neck: normal, supple, no masses, no thyromegaly Respiratory: clear to auscultation bilaterally, no wheezing, no crackles. Normal respiratory effort. No accessory muscle use.  Cardiovascular: Tachycardic, no murmurs / rubs / gallops. No extremity edema. 2+ pedal  pulses. No carotid bruits.  Abdomen: RLQ tenderness, no masses palpated. No hepatosplenomegaly. Bowel sounds positive.  Musculoskeletal: no clubbing / cyanosis. No joint deformity upper and lower extremities.  Skin: no rashes, lesions, ulcers on bilateral lower extremities Neurologic: Weakness Psychiatric: Normal mood and affect.   Labs on Admission: I have personally reviewed following labs and imaging studies  CBC: Recent Labs  Lab 07/01/19 0506  WBC 23.5*  NEUTROABS 22.5*  HGB 10.5*  HCT 31.6*  MCV 92.7  PLT 195   Basic Metabolic Panel: Recent Labs  Lab 07/01/19 0506  NA 137  K 4.4  CL 102  CO2 24  GLUCOSE 142*  BUN 65*  CREATININE 2.15*  CALCIUM 9.5   GFR: Estimated Creatinine Clearance: 11.5 mL/min (A) (by C-G formula based on SCr of 2.15 mg/dL (H)). Liver Function Tests: Recent Labs  Lab 07/01/19 0506  AST 30  ALT 21  ALKPHOS 71  BILITOT 0.8  PROT 7.1  ALBUMIN 3.0*   Recent Labs  Lab 07/01/19 0506  LIPASE 26   No results for input(s): AMMONIA in the last 168 hours. Coagulation Profile: No results for input(s): INR, PROTIME in the last 168 hours. Cardiac Enzymes: Recent Labs  Lab 07/01/19 0506  CKTOTAL 20*   BNP (last 3 results) No results for input(s): PROBNP in the  last 8760 hours. HbA1C: No results for input(s): HGBA1C in the last 72 hours. CBG: No results for input(s): GLUCAP in the last 168 hours. Lipid Profile: No results for input(s): CHOL, HDL, LDLCALC, TRIG, CHOLHDL, LDLDIRECT in the last 72 hours. Thyroid Function Tests: No results for input(s): TSH, T4TOTAL, FREET4, T3FREE, THYROIDAB in the last 72 hours. Anemia Panel: No results for input(s): VITAMINB12, FOLATE, FERRITIN, TIBC, IRON, RETICCTPCT in the last 72 hours. Urine analysis:    Component Value Date/Time   COLORURINE YELLOW (A) 07/01/2019 0551   APPEARANCEUR CLOUDY (A) 07/01/2019 0551   LABSPEC 1.012 07/01/2019 0551   PHURINE 5.0 07/01/2019 0551   GLUCOSEU NEGATIVE 07/01/2019 0551   HGBUR LARGE (A) 07/01/2019 0551   BILIRUBINUR NEGATIVE 07/01/2019 [place] 07/01/2019 0551   PROTEINUR 100 (A) 07/01/2019 0551   NITRITE NEGATIVE 07/01/2019 0551   LEUKOCYTESUR LARGE (A) 07/01/2019 0551    Radiological Exams on Admission: CT ABDOMEN PELVIS WO CONTRAST  Result Date: 07/01/2019 CLINICAL DATA:  Flank pain with kidney stone suspected.  Hip trauma EXAM: CT CHEST, ABDOMEN AND PELVIS WITHOUT CONTRAST TECHNIQUE: Multidetector CT imaging of the chest, abdomen and pelvis was performed following the standard protocol without IV contrast. COMPARISON:  None. FINDINGS: CT CHEST FINDINGS Cardiovascular: Cardiomegaly. No pericardial effusion. Extensive aortic and coronary atherosclerosis. Mediastinum/Nodes: Nodular goiter with large left lobe nodule measuring ~ 6 cm and extending into the superior mediastinum with tracheal mass effect. No tracheal narrowing or regional invasive features. Patulous esophagus without apparent wall thickening. No adenopathy Lungs/Pleura: Trace pleural effusions. Atelectasis in the lower lungs. No pneumothorax or consolidation. Musculoskeletal: Healing anterior right fourth rib fracture. Diffuse degenerative disc narrowing and endplate ridging. CT ABDOMEN  PELVIS FINDINGS Hepatobiliary: No focal liver abnormality.No evidence of biliary obstruction or stone. Pancreas: Unremarkable. Spleen: Unremarkable. Adrenals/Urinary Tract: Negative adrenals. Marked right hydroureteronephrosis due to a 8 x 5 mm stone just before the right UVJ. There are 2 additional large right renal calculi measuring up to 12 mm. Atrophic left kidney. Unremarkable bladder. Stomach/Bowel: No obstruction. Extensive distal colonic diverticulosis. No evidence of bowel inflammation. Vascular/Lymphatic: Diffuse atherosclerotic calcification. No mass or adenopathy. Reproductive:No pathologic findings.  Other: No ascites or pneumoperitoneum. Musculoskeletal: Advanced lumbar spine degeneration with mild levoscoliosis. IMPRESSION: 1. High-grade urinary obstruction on the right due to a 8 x 5 mm stone just before the UVJ. 2. Two additional large right renal calculi measuring up to 12 mm. 3. History of respiratory illness but only minor atelectasis and trace pleural fluid. Negative for pneumonia or edema. 4. Nodular goiter with mediastinal extension and tracheal mass effect. 5. Gallstones or sludge. Electronically Signed   By: Monte Fantasia M.D.   On: 07/01/2019 07:47   CT Chest Wo Contrast  Result Date: 07/01/2019 CLINICAL DATA:  Flank pain with kidney stone suspected.  Hip trauma EXAM: CT CHEST, ABDOMEN AND PELVIS WITHOUT CONTRAST TECHNIQUE: Multidetector CT imaging of the chest, abdomen and pelvis was performed following the standard protocol without IV contrast. COMPARISON:  None. FINDINGS: CT CHEST FINDINGS Cardiovascular: Cardiomegaly. No pericardial effusion. Extensive aortic and coronary atherosclerosis. Mediastinum/Nodes: Nodular goiter with large left lobe nodule measuring ~ 6 cm and extending into the superior mediastinum with tracheal mass effect. No tracheal narrowing or regional invasive features. Patulous esophagus without apparent wall thickening. No adenopathy Lungs/Pleura: Trace pleural  effusions. Atelectasis in the lower lungs. No pneumothorax or consolidation. Musculoskeletal: Healing anterior right fourth rib fracture. Diffuse degenerative disc narrowing and endplate ridging. CT ABDOMEN PELVIS FINDINGS Hepatobiliary: No focal liver abnormality.No evidence of biliary obstruction or stone. Pancreas: Unremarkable. Spleen: Unremarkable. Adrenals/Urinary Tract: Negative adrenals. Marked right hydroureteronephrosis due to a 8 x 5 mm stone just before the right UVJ. There are 2 additional large right renal calculi measuring up to 12 mm. Atrophic left kidney. Unremarkable bladder. Stomach/Bowel: No obstruction. Extensive distal colonic diverticulosis. No evidence of bowel inflammation. Vascular/Lymphatic: Diffuse atherosclerotic calcification. No mass or adenopathy. Reproductive:No pathologic findings. Other: No ascites or pneumoperitoneum. Musculoskeletal: Advanced lumbar spine degeneration with mild levoscoliosis. IMPRESSION: 1. High-grade urinary obstruction on the right due to a 8 x 5 mm stone just before the UVJ. 2. Two additional large right renal calculi measuring up to 12 mm. 3. History of respiratory illness but only minor atelectasis and trace pleural fluid. Negative for pneumonia or edema. 4. Nodular goiter with mediastinal extension and tracheal mass effect. 5. Gallstones or sludge. Electronically Signed   By: Monte Fantasia M.D.   On: 07/01/2019 07:47   CT Hip Right Wo Contrast  Result Date: 07/01/2019 CLINICAL DATA:  Hip fracture suspected and negative x-ray. EXAM: CT OF THE RIGHT HIP WITHOUT CONTRAST TECHNIQUE: Multidetector CT imaging of the right hip was performed according to the standard protocol. Multiplanar CT image reconstructions were also generated. COMPARISON:  Radiography from earlier today FINDINGS: Bones/Joint/Cartilage Remote lower sacral fracture with mild, healed displacement. No acute fracture or dislocation. Ligaments Suboptimally assessed by CT. Muscles and Tendons  No evidence of swelling or disruption. Soft tissues Mild subcutaneous reticulation over the coccyx, usually pressure related. IMPRESSION: Negative for acute fracture. Electronically Signed   By: Monte Fantasia M.D.   On: 07/01/2019 07:49   DG Chest Port 1 View  Result Date: 07/01/2019 CLINICAL DATA:  Sepsis EXAM: PORTABLE CHEST 1 VIEW COMPARISON:  Radiograph 10/18/2018 FINDINGS: Mixed interstitial and patchy opacities in both lungs. Suspect a trace left effusion. No pneumothorax. No right effusion. Cardiomegaly with a calcified tortuous aorta is similar to prior. Degenerative changes are present in the imaged spine and shoulders. The osseous structures appear diffusely demineralized which may limit detection of small or nondisplaced fractures. No acute osseous or soft tissue abnormality. Remote posttraumatic deformity left humerus. Telemetry  leads overlie the chest. IMPRESSION: Mixed interstitial and patchy opacities in both lungs and likely trace left effusion. Given the basilar predominance and patient history, findings could reflect aspiration or infection. Electronically Signed   By: Lovena Le M.D.   On: 07/01/2019 05:50   DG HIP UNILAT WITH PELVIS 2-3 VIEWS RIGHT  Result Date: 07/01/2019 CLINICAL DATA:  Fall a few days ago with right hip pain EXAM: DG HIP (WITH OR WITHOUT PELVIS) 2-3V RIGHT COMPARISON:  None. FINDINGS: There is no evidence of hip fracture or dislocation. No focal bone lesion. Generalized osteopenia. Atherosclerotic calcification. Prominent lumbar disc degeneration. IMPRESSION: No acute finding. Electronically Signed   By: Monte Fantasia M.D.   On: 07/01/2019 05:41    EKG: Independently reviewed. Sinus Tachycardia  Assessment/Plan Principal Problem:   Sepsis ([place]) Active Problems:   CKD (chronic kidney disease) stage 3, GFR 30-59 ml/min   AKI (acute kidney injury) ([place])   Fall at home, initial encounter   Hyperthyroidism   Anemia of chronic disease     Sepsis Present on admission and evidenced by tachycardia, tachypnea, elevated lactic acid level and marked leukocytosis Source of sepsis appears to be possible aspiration pneumonia as well as a urinary source. Patient noted to have significant pyuria Will place patient on Cefepime and Azithromycin adjusted to renal function Continue IV fluid hydration Follow up  results of blood and urine culture    Pneumonia Possible community-acquired to rule out aspiration pneumonia Continue empiric antibiotic therapy with Cefepime and Azithromycin adjusted to renal function Speech therapy consult for swallow function evaluation Will also benefit from GI consult for evaluation of known esophageal dysmotility syndrome   AKI superimposed on stage III chronic kidney disease Multifactorial from  volume depletion related to vomiting as well as obstructing uropathy CT scan of abdomen and pelvis shows a high-grade urinary obstruction on the right due to 8 x 5 mm obstructing stone just before the UVJ At baseline patient has a serum creatinine of 1.25, on admission her serum creatinine was 2.15 with a BUN of 65 Will request urology consult for further evaluation  UTI  Patient with significant pyuria Has a known history of nephrolithiasis Treat empirically with Cefepime until urine culture results become available   Hyperthyroidism Continue Methimazole   Chronic lower extremity wounds Secondary to known PAD Continue local wound care  Will request wound care consult   S/P Fall With complaints of right hip pain Imaging is negative for fracture Will request PT evaluation Fall precautions    Anemia of chronic disease H/H is stable Will monitor closely  DVT prophylaxis: Heparin Code Status: DNR Family Communication: Attempted to reach patient's son over the phone. Left a voice mail. Awaiting call back Disposition Plan: Back to previous home environment Consults called: Urology, Wound  care    Hamilton Marinello MD [facility]     07/01/2019, 9:38 AM

## 2019-07-01 NOTE — Op Note (Signed)
Date of procedure: 07/01/19  Preoperative diagnosis:  1. Right obstructing ureteral calculus 2. Sepsis of urinary source 3. Right hydroureteronephrosis  Postoperative diagnosis:  1. Same as above  Procedure: 1. Right retrograde pyelogram 2. Right ureteral stent placement  Surgeon: Hollice Espy, MD  Anesthesia: General  Complications: None  Intraoperative findings: High-grade ureteral obstruction with minimal to no contrast beyond the level of the stone which was visible on fluoroscopy.  Diffusely erythematous bladder with copious debris.  Upper tract urine culture obtained.  EBL: Minimal  Specimens: Right renal pelvic urine culture  Drains: 24 French double-J ureteral stent on right, 16 French Foley catheter  Indication: Sandy Roberson is a 86 y.o. patient with right distal proximal ureteral calculus and sepsis of urinary source.  After reviewing the management options for treatment, she elected to proceed with the above surgical procedure(s). We have discussed the potential benefits and risks of the procedure, side effects of the proposed treatment, the likelihood of the patient achieving the goals of the procedure, and any potential problems that might occur during the procedure or recuperation. Informed consent has been obtained.  Description of procedure:  The patient was taken to the operating room and general anesthesia was induced.  The patient was placed in the dorsal lithotomy position, prepped and draped in the usual sterile fashion, and preoperative antibiotics were administered. A preoperative time-out was performed.   Of note, moderate to severe labial adhesions were appreciated but despite this, the meatus could be identified.  A 21 French scope was advanced per urethra into the bladder.  The bladder itself was erythematous with copious amounts of debris consistent with severe cystitis.  Attention was turned to the right ureteral orifice.  [facility]out imaging  revealed the stone within the distal ureter.  The right UO was intubated using a 5 [place] open-ended ureteral catheter just within the UO.  I attempted to inject contrast however due to the high-grade nature of the obstruction, culture now contrast refluxed above the stone.  I then advanced a sensor wire around the stone up to the level of the kidney.  This in fact went surprisingly easily despite the high-grade nature of the obstruction.  An open-ended ureteral catheter was advanced all the way up to level the proximal ureter.  The wire was removed and dark debris-filled urine was evacuated, a total of 10 cc for additional urine culture.  The wire was then replaced and the open-ended was removed.  A 6 x 24 French double-J ureteral stent was advanced over the wire up to level the kidney.  The wires partially drawn till full coils noted both within the renal pelvis as well as within the bladder.  The bladder was drained.  A 16 French Foley catheter was placed sterilely to help facilitate maximal urinary decompression.  She was then repositioned supine position, version anesthesia, taken back in stable condition.  Plan: Patient has been admitted to the hospitalist service currently on broad-spectrum antibiotics.  Follow-up urine culture.  She will ultimately need definitive management of her stone when she is cleared the infection.  Okay to DC Foley once she is clinically improving.  Hollice Espy, M.D.

## 2019-07-01 NOTE — Progress Notes (Signed)
SLP Cancellation Note  Patient Details Name: Evaluna Utke MRN: [id number] DOB: 1915-07-23   Cancelled treatment:       Reason Eval/Treat Not Completed: Patient at procedure or test/unavailable(chart reviewed). Per chart notes, pt was transferred from the ED to Surgery for obstructing right distal ureteral calculus with signs symptoms of sepsis of urinary source. Per pt's history, she has significant Esophageal dysmotility(EGD and previous ST evaluation in 2020).  ST services will f/u w/ pt tomorrow for BSE d/t current Surgery today. Recommend oral care for hygiene and stimulation of swallowing post surgery; general aspiration precautions.      Orinda Kenner, [place], CCC-SLP [facility] 07/01/2019, 3:30 PM

## 2019-07-01 NOTE — Progress Notes (Signed)
Dr Kayleen Memos called  Blood pressure 89/66  Dr Kayleen Memos does not want to give any more fluids or medication at this time   Pt alert and talkative   Son called for update

## 2019-07-01 NOTE — ED Notes (Signed)
Son is at bedside with patient

## 2019-07-01 NOTE — Progress Notes (Signed)
Pharmacy Antibiotic Note  Sandy Roberson is a 86 y.o. female admitted on 07/01/2019 with cellulitis.  Pharmacy has been consulted for Vancomycin dosing.  Pt received Rocephin and Vancomycin in ED  Plan: Will dose Vancomycin based on levels due to age and changing renal fxn / elevated SCr  Height: 5\' 5"  (165.1 cm) Weight: 118 lb (53.5 kg) IBW/kg (Calculated) : 57  Temp (24hrs), Avg:97.8 F (36.6 C), Min:97.8 F (36.6 C), Max:97.8 F (36.6 C)  Recent Labs  Lab 07/01/19 0506  WBC 23.5*  CREATININE 2.15*  LATICACIDVEN 3.3*    Estimated Creatinine Clearance: 11.5 mL/min (A) (by C-G formula based on SCr of 2.15 mg/dL (H)).    Allergies  Allergen Reactions  . Penicillins Rash    Did it involve swelling of the face/tongue/throat, SOB, or low BP? No Did it involve sudden or severe rash/hives, skin peeling, or any reaction on the inside of your mouth or nose? No Did you need to seek medical attention at a hospital or doctor's office? No When did it last happen?10 Years If all above answers are "NO", may proceed with cephalosporin use.    Antimicrobials this admission:   >>    >>   Dose adjustments this admission:   Microbiology results:  BCx:   UCx:    Sputum:    MRSA PCR:   Thank you for allowing pharmacy to be a part of this patient's care.  Hart Robinsons A 07/01/2019 6:23 AM

## 2019-07-02 DIAGNOSIS — R6520 Severe sepsis without septic shock: Secondary | ICD-10-CM

## 2019-07-02 DIAGNOSIS — N179 Acute kidney failure, unspecified: Secondary | ICD-10-CM

## 2019-07-02 DIAGNOSIS — L97529 Non-pressure chronic ulcer of other part of left foot with unspecified severity: Secondary | ICD-10-CM

## 2019-07-02 DIAGNOSIS — E872 Acidosis, unspecified: Secondary | ICD-10-CM

## 2019-07-02 DIAGNOSIS — L97519 Non-pressure chronic ulcer of other part of right foot with unspecified severity: Secondary | ICD-10-CM

## 2019-07-02 DIAGNOSIS — A419 Sepsis, unspecified organism: Principal | ICD-10-CM

## 2019-07-02 DIAGNOSIS — L8990 Pressure ulcer of unspecified site, unspecified stage: Secondary | ICD-10-CM | POA: Insufficient documentation

## 2019-07-02 DIAGNOSIS — E0590 Thyrotoxicosis, unspecified without thyrotoxic crisis or storm: Secondary | ICD-10-CM

## 2019-07-02 DIAGNOSIS — R531 Weakness: Secondary | ICD-10-CM

## 2019-07-02 LAB — CBC
HCT: 25.7 % — ABNORMAL LOW (ref 36.0–46.0)
Hemoglobin: 8.3 g/dL — ABNORMAL LOW (ref 12.0–15.0)
MCH: 30.5 pg (ref 26.0–34.0)
MCHC: 32.3 g/dL (ref 30.0–36.0)
MCV: 94.5 fL (ref 80.0–100.0)
Platelets: 154 10*3/uL (ref 150–400)
RBC: 2.72 MIL/uL — ABNORMAL LOW (ref 3.87–5.11)
RDW: 16.2 % — ABNORMAL HIGH (ref 11.5–15.5)
WBC: 18.3 10*3/uL — ABNORMAL HIGH (ref 4.0–10.5)
nRBC: 0 % (ref 0.0–0.2)

## 2019-07-02 LAB — BASIC METABOLIC PANEL
Anion gap: 8 (ref 5–15)
BUN: 52 mg/dL — ABNORMAL HIGH (ref 8–23)
CO2: 21 mmol/L — ABNORMAL LOW (ref 22–32)
Calcium: 8.3 mg/dL — ABNORMAL LOW (ref 8.9–10.3)
Chloride: 111 mmol/L (ref 98–111)
Creatinine, Ser: 1.56 mg/dL — ABNORMAL HIGH (ref 0.44–1.00)
GFR calc Af Amer: 31 mL/min — ABNORMAL LOW (ref >60)
GFR calc non Af Amer: 27 mL/min — ABNORMAL LOW (ref >60)
Glucose, Bld: 83 mg/dL (ref 70–99)
Potassium: 4.5 mmol/L (ref 3.5–5.1)
Sodium: 140 mmol/L (ref 135–145)

## 2019-07-02 LAB — PROTIME-INR
INR: 1.4 — ABNORMAL HIGH (ref 0.8–1.2)
Prothrombin Time: 16.8 seconds — ABNORMAL HIGH (ref 11.4–15.2)

## 2019-07-02 LAB — CORTISOL-AM, BLOOD: Cortisol - AM: 7 ug/dL (ref 6.7–22.6)

## 2019-07-02 LAB — PROCALCITONIN: Procalcitonin: 14.17 ng/mL

## 2019-07-02 MED ORDER — AZITHROMYCIN 250 MG PO TABS
250.0000 mg | ORAL_TABLET | Freq: Every day | ORAL | Status: DC
  Administered 2019-07-03: 250 mg via ORAL
  Filled 2019-07-02: qty 1

## 2019-07-02 NOTE — Evaluation (Signed)
Physical Therapy Evaluation Patient Details Name: Sandy Roberson MRN: [id number] DOB: Jul 05, 1915 Today's Date: 07/02/2019   History of Present Illness  Sandy Roberson is a 86 y.o. 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  She lives at home with her son and is surprisingly functional for her age.  He brought her to the emergency room for evaluation of vomiting and inability to tolerate any oral intake.  He notes that any time she eats or drinks something it gets caught in her throat resulting in immediate vomiting.  He was concerned that she may be dehydrated.  Patient son also states that she fell several days prior to coming to the hospital and has been complaining of severe pain in her right hip.  Pain is worse movement.  Per patient's son she is still able to ambulate but certain movements elicit pain in the right hip. CT of right hip negative for fracture; Patient diagnosed with sepsis and is s/p right ureteral stent placement on 07/01/19.  Clinical Impression  89 pleasant woman, very hard of hearing. Most of history intake taken from chart. Patient did require min A for supine to sitting edge of bed. She does exhibit increased weakness in BLE, particularly in hip grossly 3/5. Patient required mod A for sit<>Stand and upon standing exhibits a heavy right lateral lean. Patient was able to use RW for stand pivot transfer with mod A from PT. However she was unable to ambulate at this time. PT recommends SNF rehab upon discharge to improve patient's strength, balance and mobility. Patient expressed desire to go home and states, "I do better in my home environment." She does have help from her son at home. However she has had recent falls. She will need  significant help upon discharge.     Follow Up Recommendations SNF    Equipment Recommendations  None recommended by PT    Recommendations for Other Services       Precautions / Restrictions Precautions Precautions: Fall Restrictions Weight Bearing Restrictions: No      Mobility  Bed Mobility Overal bed mobility: Needs Assistance Bed Mobility: Supine to Sit     Supine to sit: Min assist     General bed mobility comments: elevated head of bed, required increased time for scooting and hand placement;  Transfers Overall transfer level: Needs assistance Equipment used: Rolling walker (2 wheeled) Transfers: Sit to/from [facility] Sit to Stand: Mod assist Stand pivot transfers: Mod assist       General transfer comment: with RW with cues for hand placement; upon standing she exhibits heavy right lateral lean requiring mod A for righting to midline; Also required assistance for RW management and cues for weight shift for stand pivot to bedside chair;  Ambulation/Gait             General Gait Details: pt unable to safely ambulate at this time; Attempted to take a few steps to bedside chair but was heavily leaning on RLE and unable to weight shift to step with RLE;  Stairs            Wheelchair Mobility    Modified Rankin (Stroke Patients Only)       Balance Overall balance assessment: Needs assistance Sitting-balance support: Bilateral upper extremity supported;Feet supported Sitting balance-Leahy Scale: Poor Sitting balance - Comments: exhibits posterior lean requiring  cues for hand placement for holding self in neutral position;   Standing balance support: Bilateral upper extremity supported Standing balance-Leahy Scale: Zero Standing balance comment: Requires mod A exhibiting heavy right lateral lean;                             Pertinent Vitals/Pain Pain Assessment: No/denies pain    Home Living Family/patient  expects to be discharged to:: Private residence Living Arrangements: Children Available Help at Discharge: Family;Available 24 hours/day Type of Home: House Home Access: Stairs to enter   [employer] of Steps: 2-3 steps, she only goes out for appts, son helps her in and out of the home Home Layout: One level Home Equipment: Environmental consultant - 2 wheels;Cane - single point;Grab bars - tub/shower;Shower seat Additional Comments: Pt reports she was able to walk with RW for short distances prior to admittance; reports having a few falls recently;    Prior Function Level of Independence: Needs assistance   Gait / Transfers Assistance Needed: Used RW for short distances  ADL's / Homemaking Assistance Needed: son/daughter in law helped with most ADLs; she would get a bath every other week and does need help with dressing        Hand Dominance   Dominant Hand: Right    Extremity/Trunk Assessment   Upper Extremity Assessment Upper Extremity Assessment: Generalized weakness    Lower Extremity Assessment Lower Extremity Assessment: Generalized weakness    Cervical / Trunk Assessment Cervical / Trunk Assessment: Kyphotic  Communication   Communication: Deaf;HOH  Cognition Arousal/Alertness: Awake/alert Behavior During Therapy: WFL for tasks assessed/performed Overall Cognitive Status: Difficult to assess                                        General Comments      Exercises     Assessment/Plan    PT Assessment Patient needs continued PT services  PT Problem List Decreased strength;Decreased mobility;Decreased safety awareness;Decreased coordination;Decreased activity tolerance;Decreased balance       PT Treatment Interventions DME instruction;Therapeutic exercise;Gait training;Balance training;Stair training;Neuromuscular re-education;Functional mobility training;Therapeutic activities;Patient/family education    PT Goals (Current goals can be found in  the Care Plan section)  Acute Rehab PT Goals Patient Stated Goal: to go home PT Goal Formulation: With patient Time For Goal Achievement: 07/16/19 Potential to Achieve Goals: Fair    Frequency Min 2X/week   Barriers to discharge Inaccessible home environment has steps to enter home;    Co-evaluation               AM-PAC PT "6 Clicks" Mobility  Outcome Measure Help needed turning from your back to your side while in a flat bed without using bedrails?: A Little Help needed moving from lying on your back to sitting on the side of a flat bed without using bedrails?: A Little Help needed moving to and from a bed to a chair (including a wheelchair)?: A Lot Help needed standing up from a chair using your arms (e.g., wheelchair or bedside chair)?: A Lot Help needed to walk in hospital room?: Total Help needed climbing 3-5 steps with a railing? : Total 6 Click Score: 12    End of Session Equipment Utilized During Treatment: Gait belt Activity Tolerance: Patient tolerated treatment well;No increased pain Patient left: in chair;with call bell/phone within reach;with chair alarm set Nurse Communication: Mobility  status PT Visit Diagnosis: Unsteadiness on feet (R26.81);Muscle weakness (generalized) (M62.81);History of falling (Z91.81)    Time: 2202-5427 PT Time Calculation (min) (ACUTE ONLY): 37 min   Charges:   PT Evaluation $PT Eval Low Complexity: 1 Low            Kullen Tomasetti PT, DPT 07/02/2019, 12:12 PM

## 2019-07-02 NOTE — Anesthesia Postprocedure Evaluation (Signed)
Anesthesia Post Note  Patient: Film/video editor  Procedure(s) Performed: CYSTOSCOPY WITH RETROGRADE PYELOGRAM/URETERAL STENT PLACEMENT (Right )  Patient location during evaluation: PACU Anesthesia Type: General Level of consciousness: awake and alert and oriented Pain management: pain level controlled Vital Signs Assessment: post-procedure vital signs reviewed and stable Respiratory status: spontaneous breathing Cardiovascular status: blood pressure returned to baseline Postop Assessment: no headache Anesthetic complications: no     Last Vitals:  Vitals:   07/02/19 0403 07/02/19 0407  BP:  101/65  Pulse: (!) 105 (!) 109  Resp: 19 16  Temp:  36.6 C  SpO2: 97% 99%    Last Pain:  Vitals:   07/02/19 0810  TempSrc:   PainSc: 0-No pain                 Tocarra Gassen

## 2019-07-02 NOTE — Progress Notes (Signed)
Patient ID: Sandy Roberson, female   DOB: [birth year], 86 y.o.   MRN: [id number] [facility] PROGRESS NOTE  Sandy Roberson WUJ:[id number] DOB: 1915/08/10 DOA: 07/01/2019 PCP: Tracie Harrier, MD  HPI/Subjective: Patient feeling okay.  Was asking when she can go home.  Patient has very difficult hearing and unable to communicate with.  Objective: Vitals:   07/02/19 0407 07/02/19 1205  BP: 101/65 118/74  Pulse: (!) 109 (!) 52  Resp: 16 18  Temp: 97.8 F (36.6 C) 97.8 F (36.6 C)  SpO2: 99% 97%    Intake/Output Summary (Last 24 hours) at 07/02/2019 1701 Last data filed at 07/02/2019 1423 Gross per 24 hour  Intake 2022.1 ml  Output 600 ml  Net 1422.1 ml   Filed Weights   07/01/19 0434  Weight: 53.5 kg    ROS: Review of Systems  Unable to perform ROS: Other  Patient very hard of hearing Exam: Physical Exam  HENT:  Nose: No mucosal edema.  Mouth/Throat: No oropharyngeal exudate or posterior oropharyngeal edema.  Eyes: Conjunctivae and lids are normal.  Cardiovascular: S1 normal and S2 normal. Exam reveals no gallop.  No murmur heard. Respiratory: No respiratory distress. She has no wheezes. She has no rhonchi. She has no rales.  GI: Soft. Bowel sounds are normal. There is no abdominal tenderness.  Musculoskeletal:     Right ankle: No swelling.     Left ankle: No swelling.  Lymphadenopathy:    She has no cervical adenopathy.  Neurological: She is alert.  Skin: Skin is warm. Nails show no clubbing.  Psychiatric: She has a normal mood and affect.      Data Reviewed: Basic Metabolic Panel: Recent Labs  Lab 07/01/19 0506 07/02/19 0622  NA 137 140  K 4.4 4.5  CL 102 111  CO2 24 21*  GLUCOSE 142* 83  BUN 65* 52*  CREATININE 2.15* 1.56*  CALCIUM 9.5 8.3*   Liver Function Tests: Recent Labs  Lab 07/01/19 0506  AST 30  ALT 21  ALKPHOS 71  BILITOT 0.8  PROT 7.1  ALBUMIN 3.0*   Recent Labs  Lab 07/01/19 0506  LIPASE 26   CBC: Recent  Labs  Lab 07/01/19 0506 07/02/19 0622  WBC 23.5* 18.3*  NEUTROABS 22.5*  --   HGB 10.5* 8.3*  HCT 31.6* 25.7*  MCV 92.7 94.5  PLT 217 154   Cardiac Enzymes: Recent Labs  Lab 07/01/19 0506  CKTOTAL 20*     Recent Results (from the past 240 hour(s))  Aerobic Culture (superficial specimen)     Status: None   Collection Time: 06/26/19 11:01 AM   Specimen: Wound  Result Value Ref Range Status   Specimen Description WOUND LEFT FOOT  Final   Special Requests NONE  Final   Gram Stain   Final    ABUNDANT WBC PRESENT, PREDOMINANTLY PMN NO ORGANISMS SEEN Performed at [facility], [address], [place], [place] [address]    Culture FEW PROTEUS MIRABILIS  Final   Report Status 06/28/2019 FINAL  Final   Organism ID, Bacteria PROTEUS MIRABILIS  Final      Susceptibility   Proteus mirabilis - MIC*    AMPICILLIN <=2 SENSITIVE Sensitive     CEFAZOLIN 8 SENSITIVE Sensitive     CEFEPIME <=0.12 SENSITIVE Sensitive     CEFTAZIDIME <=1 SENSITIVE Sensitive     CEFTRIAXONE <=0.25 SENSITIVE Sensitive     CIPROFLOXACIN >=4 RESISTANT Resistant     GENTAMICIN 8 INTERMEDIATE Intermediate  IMIPENEM 2 SENSITIVE Sensitive     TRIMETH/SULFA >=320 RESISTANT Resistant     AMPICILLIN/SULBACTAM <=2 SENSITIVE Sensitive     PIP/TAZO <=4 SENSITIVE Sensitive     * FEW PROTEUS MIRABILIS  Respiratory Panel by RT PCR (Flu A&B, Covid) - Nasopharyngeal Swab     Status: None   Collection Time: 07/01/19  5:06 AM   Specimen: Nasopharyngeal Swab  Result Value Ref Range Status   SARS Coronavirus 2 by RT PCR NEGATIVE NEGATIVE Final    Comment: (NOTE) SARS-CoV-2 target nucleic acids are NOT DETECTED. The SARS-CoV-2 RNA is generally detectable in upper respiratoy specimens during the acute phase of infection. The lowest concentration of SARS-CoV-2 viral copies this assay can detect is 131 copies/mL. A negative result does not preclude SARS-Cov-2 infection and should not be used as the sole basis for  treatment or other patient management decisions. A negative result may occur with  improper specimen collection/handling, submission of specimen other than nasopharyngeal swab, presence of viral mutation(s) within the areas targeted by this assay, and inadequate number of viral copies (<131 copies/mL). A negative result must be combined with clinical observations, patient history, and epidemiological information. The expected result is Negative. Fact Sheet for Patients:  PinkCheek.be Fact Sheet for Healthcare Providers:  GravelBags.it This test is not yet ap proved or cleared by the [place] FDA and  has been authorized for detection and/or diagnosis of SARS-CoV-2 by FDA under an Emergency Use Authorization (EUA). This EUA will remain  in effect (meaning this test can be used) for the duration of the COVID-19 declaration under Section 564(b)(1) of the Act, 21 U.S.C. section 360bbb-3(b)(1), unless the authorization is terminated or revoked sooner.    Influenza A by PCR NEGATIVE NEGATIVE Final   Influenza B by PCR NEGATIVE NEGATIVE Final    Comment: (NOTE) The Xpert Xpress SARS-CoV-2/FLU/RSV assay is intended as an aid in  the diagnosis of influenza from Nasopharyngeal swab specimens and  should not be used as a sole basis for treatment. Nasal washings and  aspirates are unacceptable for Xpert Xpress SARS-CoV-2/FLU/RSV  testing. Fact Sheet for Patients: PinkCheek.be Fact Sheet for Healthcare Providers: GravelBags.it This test is not yet approved or cleared by the [place] FDA and  has been authorized for detection and/or diagnosis of SARS-CoV-2 by  FDA under an Emergency Use Authorization (EUA). This EUA will remain  in effect (meaning this test can be used) for the duration of the  Covid-19 declaration under Section 564(b)(1) of the Act, 21  U.S.C. section  360bbb-3(b)(1), unless the authorization is  terminated or revoked. Performed at [facility], [address] [place], [place] [address]   Blood Culture (routine x 2)     Status: None (Preliminary result)   Collection Time: 07/01/19  5:51 AM   Specimen: BLOOD  Result Value Ref Range Status   Specimen Description BLOOD LEFT FA  Final   Special Requests   Final    BOTTLES DRAWN AEROBIC AND ANAEROBIC Blood Culture results may not be optimal due to an inadequate volume of blood received in culture bottles   Culture   Final    NO GROWTH 1 DAY Performed at [facility], [address], [place], [place] [address]    Report Status PENDING  Incomplete  Blood Culture (routine x 2)     Status: None (Preliminary result)   Collection Time: 07/01/19  5:51 AM   Specimen: BLOOD  Result Value Ref Range Status   Specimen Description  BLOOD RIGHT AC  Final   Special Requests   Final    BOTTLES DRAWN AEROBIC AND ANAEROBIC Blood Culture adequate volume   Culture   Final    NO GROWTH 1 DAY Performed at [facility], [address] [place], [place] [address]    Report Status PENDING  Incomplete  Urine culture     Status: Abnormal (Preliminary result)   Collection Time: 07/01/19  5:51 AM   Specimen: In/Out Cath Urine  Result Value Ref Range Status   Specimen Description   Final    IN/OUT CATH URINE Performed at [facility], [address], [place], [place] [address]    Special Requests   Final    NONE Performed at [facility], [address] [place], [place] [address]    Culture (A)  Final    >=100,000 COLONIES/mL ESCHERICHIA COLI 60,000 COLONIES/mL PROTEUS MIRABILIS    Report Status PENDING  Incomplete     Studies: CT ABDOMEN PELVIS WO CONTRAST  Result Date: 07/01/2019 CLINICAL DATA:  Flank pain with kidney stone suspected.  Hip trauma EXAM: CT CHEST, ABDOMEN AND PELVIS WITHOUT CONTRAST TECHNIQUE: Multidetector CT imaging of the chest,  abdomen and pelvis was performed following the standard protocol without IV contrast. COMPARISON:  None. FINDINGS: CT CHEST FINDINGS Cardiovascular: Cardiomegaly. No pericardial effusion. Extensive aortic and coronary atherosclerosis. Mediastinum/Nodes: Nodular goiter with large left lobe nodule measuring ~ 6 cm and extending into the superior mediastinum with tracheal mass effect. No tracheal narrowing or regional invasive features. Patulous esophagus without apparent wall thickening. No adenopathy Lungs/Pleura: Trace pleural effusions. Atelectasis in the lower lungs. No pneumothorax or consolidation. Musculoskeletal: Healing anterior right fourth rib fracture. Diffuse degenerative disc narrowing and endplate ridging. CT ABDOMEN PELVIS FINDINGS Hepatobiliary: No focal liver abnormality.No evidence of biliary obstruction or stone. Pancreas: Unremarkable. Spleen: Unremarkable. Adrenals/Urinary Tract: Negative adrenals. Marked right hydroureteronephrosis due to a 8 x 5 mm stone just before the right UVJ. There are 2 additional large right renal calculi measuring up to 12 mm. Atrophic left kidney. Unremarkable bladder. Stomach/Bowel: No obstruction. Extensive distal colonic diverticulosis. No evidence of bowel inflammation. Vascular/Lymphatic: Diffuse atherosclerotic calcification. No mass or adenopathy. Reproductive:No pathologic findings. Other: No ascites or pneumoperitoneum. Musculoskeletal: Advanced lumbar spine degeneration with mild levoscoliosis. IMPRESSION: 1. High-grade urinary obstruction on the right due to a 8 x 5 mm stone just before the UVJ. 2. Two additional large right renal calculi measuring up to 12 mm. 3. History of respiratory illness but only minor atelectasis and trace pleural fluid. Negative for pneumonia or edema. 4. Nodular goiter with mediastinal extension and tracheal mass effect. 5. Gallstones or sludge. Electronically Signed   By: Monte Fantasia M.D.   On: 07/01/2019 07:47   CT Chest Wo  Contrast  Result Date: 07/01/2019 CLINICAL DATA:  Flank pain with kidney stone suspected.  Hip trauma EXAM: CT CHEST, ABDOMEN AND PELVIS WITHOUT CONTRAST TECHNIQUE: Multidetector CT imaging of the chest, abdomen and pelvis was performed following the standard protocol without IV contrast. COMPARISON:  None. FINDINGS: CT CHEST FINDINGS Cardiovascular: Cardiomegaly. No pericardial effusion. Extensive aortic and coronary atherosclerosis. Mediastinum/Nodes: Nodular goiter with large left lobe nodule measuring ~ 6 cm and extending into the superior mediastinum with tracheal mass effect. No tracheal narrowing or regional invasive features. Patulous esophagus without apparent wall thickening. No adenopathy Lungs/Pleura: Trace pleural effusions. Atelectasis in the lower lungs. No pneumothorax or consolidation. Musculoskeletal: Healing anterior right fourth rib fracture. Diffuse degenerative disc narrowing and endplate ridging.  CT ABDOMEN PELVIS FINDINGS Hepatobiliary: No focal liver abnormality.No evidence of biliary obstruction or stone. Pancreas: Unremarkable. Spleen: Unremarkable. Adrenals/Urinary Tract: Negative adrenals. Marked right hydroureteronephrosis due to a 8 x 5 mm stone just before the right UVJ. There are 2 additional large right renal calculi measuring up to 12 mm. Atrophic left kidney. Unremarkable bladder. Stomach/Bowel: No obstruction. Extensive distal colonic diverticulosis. No evidence of bowel inflammation. Vascular/Lymphatic: Diffuse atherosclerotic calcification. No mass or adenopathy. Reproductive:No pathologic findings. Other: No ascites or pneumoperitoneum. Musculoskeletal: Advanced lumbar spine degeneration with mild levoscoliosis. IMPRESSION: 1. High-grade urinary obstruction on the right due to a 8 x 5 mm stone just before the UVJ. 2. Two additional large right renal calculi measuring up to 12 mm. 3. History of respiratory illness but only minor atelectasis and trace pleural fluid. Negative  for pneumonia or edema. 4. Nodular goiter with mediastinal extension and tracheal mass effect. 5. Gallstones or sludge. Electronically Signed   By: Monte Fantasia M.D.   On: 07/01/2019 07:47   CT Hip Right Wo Contrast  Result Date: 07/01/2019 CLINICAL DATA:  Hip fracture suspected and negative x-ray. EXAM: CT OF THE RIGHT HIP WITHOUT CONTRAST TECHNIQUE: Multidetector CT imaging of the right hip was performed according to the standard protocol. Multiplanar CT image reconstructions were also generated. COMPARISON:  Radiography from earlier today FINDINGS: Bones/Joint/Cartilage Remote lower sacral fracture with mild, healed displacement. No acute fracture or dislocation. Ligaments Suboptimally assessed by CT. Muscles and Tendons No evidence of swelling or disruption. Soft tissues Mild subcutaneous reticulation over the coccyx, usually pressure related. IMPRESSION: Negative for acute fracture. Electronically Signed   By: Monte Fantasia M.D.   On: 07/01/2019 07:49   DG Chest Port 1 View  Result Date: 07/01/2019 CLINICAL DATA:  Sepsis EXAM: PORTABLE CHEST 1 VIEW COMPARISON:  Radiograph 10/18/2018 FINDINGS: Mixed interstitial and patchy opacities in both lungs. Suspect a trace left effusion. No pneumothorax. No right effusion. Cardiomegaly with a calcified tortuous aorta is similar to prior. Degenerative changes are present in the imaged spine and shoulders. The osseous structures appear diffusely demineralized which may limit detection of small or nondisplaced fractures. No acute osseous or soft tissue abnormality. Remote posttraumatic deformity left humerus. Telemetry leads overlie the chest. IMPRESSION: Mixed interstitial and patchy opacities in both lungs and likely trace left effusion. Given the basilar predominance and patient history, findings could reflect aspiration or infection. Electronically Signed   By: Lovena Le M.D.   On: 07/01/2019 05:50   DG OR UROLOGY CYSTO IMAGE ([place])  Result Date:  07/01/2019 There is no interpretation for this exam.  This order is for images obtained during a surgical procedure.  Please See "Surgeries" Tab for more information regarding the procedure.   DG HIP UNILAT WITH PELVIS 2-3 VIEWS RIGHT  Result Date: 07/01/2019 CLINICAL DATA:  Fall a few days ago with right hip pain EXAM: DG HIP (WITH OR WITHOUT PELVIS) 2-3V RIGHT COMPARISON:  None. FINDINGS: There is no evidence of hip fracture or dislocation. No focal bone lesion. Generalized osteopenia. Atherosclerotic calcification. Prominent lumbar disc degeneration. IMPRESSION: No acute finding. Electronically Signed   By: Monte Fantasia M.D.   On: 07/01/2019 05:41    Scheduled Meds: . aspirin  81 mg Oral Daily  . azithromycin  250 mg Oral Daily  . calcitRIOL  0.25 mcg Oral Daily  . Chlorhexidine Gluconate Cloth  6 each Topical Daily  . cholecalciferol  2,000 Units Oral Daily  . heparin  5,000 Units Subcutaneous Q8H  .  methimazole  5 mg Oral Daily   Continuous Infusions: . ceFEPime (MAXIPIME) IV 2 g (07/02/19 1009)    Assessment/Plan:  1. Clinical sepsis secondary to obstructing stone and urinary tract infection.  Possible also pneumonia.  Also with acute kidney injury.  Patient given cefepime and azithromycin.  Urine culture growing E. coli and Proteus.  Awaiting sensitivities.  Patient had urgent stent placed yesterday.  Foley came out today.  Urology will follow up as outpatient for stone and stent removal. 2. Acute kidney injury.  Creatinine improved from 2.15 down to 1.56.  Since the patient is 85 years old I will stop IV fluids today.  Check creatinine again tomorrow morning. 3. Lactic acidosis.  Patient received IV fluids. 4. Weakness.  Physical therapy recommended rehab. 5. Hypothyroidism on Tapazole 6. Bilateral first toe ulcerations, present on admission.  Proteus growing out of that wound culture from 6 days ago.  Sensitive to cephalosporins.  Code Status:     Code Status Orders   (From admission, onward)         Start     Ordered   07/01/19 0725  Do not attempt resuscitation (DNR)  Continuous    Question Answer Comment  In the event of cardiac or respiratory ARREST Do not call a "code blue"   In the event of cardiac or respiratory ARREST Do not perform Intubation, CPR, defibrillation or ACLS   In the event of cardiac or respiratory ARREST Use medication by any route, position, wound care, and other measures to relive pain and suffering. May use oxygen, suction and manual treatment of airway obstruction as needed for comfort.   Comments Full treatment including antibiotics and fluids up until the point of respiratory or cardiac arrest.  Verified by son/HCPOA, Myrtie Hawk.      07/01/19 0730        Code Status History    Date Active Date Inactive Code Status Order ID Comments User Context   07/01/2019 0553 07/01/2019 0730 DNR [id number]  Hinda Kehr, MD ED   04/23/2019 1040 04/23/2019 1715 Full Code [id number]  Algernon Huxley, MD Inpatient   03/20/2019 1637 03/20/2019 2131 Full Code [id number]  Algernon Huxley, MD Inpatient   02/25/2019 1435 02/28/2019 2224 Full Code [id number]  Flora Lipps, MD Inpatient   01/05/2019 1327 01/05/2019 1854 Full Code [id number]  Algernon Huxley, MD Inpatient   11/24/2018 1313 11/24/2018 1822 Full Code [id number]  Algernon Huxley, MD Inpatient   10/19/2018 0149 10/21/2018 1719 Full Code [id number]  Mayer Camel, NP ED   04/05/2016 1836 04/06/2016 1723 Full Code [id number]  Dustin Flock, MD Inpatient   09/19/2014 2314 09/25/2014 1757 Full Code [id number]  Hower, Aaron Mose, MD ED   Advance Care Planning Activity    Advance Directive Documentation     Most Recent Value  Type of Advance Directive  Living will, Healthcare Power of Attorney  Pre-existing out of facility DNR order (yellow form or pink MOST form)  --  "MOST" Form in Place?  --     Family Communication: Spoke with son on the phone Disposition Plan: Physical therapy recommended rehab but I  think this son may take home with home health.  Would like to see sensitivities of the urine culture before making a decision on this patient.  Would like to see white blood cell count come down.  Potential disposition tomorrow versus Saturday.   Consultants:  Urology  Procedures:  Stent placement by urology  Antibiotics:  Cefepime  Zithromax  Time spent: 28 minutes  [place]

## 2019-07-02 NOTE — NC FL2 (Signed)
[place] LEVEL OF CARE SCREENING TOOL     IDENTIFICATION  Patient Name: Sandy Roberson Birthdate: February 07, 1916 Sex: female Admission Date (Current Location): 07/01/2019  [facility] and [place] Number:  Engineering geologist and Address:         Provider Number: [id number]  Attending Physician Name and Address:  Loletha Grayer, MD  Relative Name and Phone Number:       Current Level of Care: Hospital Recommended Level of Care: [place] Prior Approval Number:    Date Approved/Denied:   PASRR Number: [id number]  Discharge Plan: SNF    Current Diagnoses: Patient Active Problem List   Diagnosis Date Noted  . Pressure injury of skin 07/02/2019  . Sepsis ([place]) 07/01/2019  . AKI (acute kidney injury) ([place]) 07/01/2019  . Fall at home, initial encounter 07/01/2019  . Hyperthyroidism 07/01/2019  . Anemia of chronic disease 07/01/2019  . Foot infection 02/25/2019  . Cellulitis 10/29/2018  . Food impaction of esophagus   . Stricture and stenosis of esophagus   . Esophageal obstruction due to food impaction 10/19/2018  . Atherosclerosis of native arteries of the extremities with ulceration ([place]) 03/28/2018  . Chronic kidney disease 03/11/2018  . CKD (chronic kidney disease) stage 3, GFR 30-59 ml/min 03/11/2018  . Coronary artery disease 03/11/2018  . Hyperlipidemia 03/11/2018  . Hypertension 03/11/2018  . Thyroid disease 03/11/2018  . SOB (shortness of breath) 04/05/2016  . Flu vaccine need 02/17/2016  . H/O dehydration 08/12/2015  . ITP (idiopathic thrombocytopenic purpura) 09/25/2014  . ARF (acute renal failure) ([place]) 09/25/2014  . Hypokalemia 09/25/2014  . Acute diastolic CHF (congestive heart failure) ([place]) 09/25/2014  . Sepsis due to urinary tract infection ([place]) 09/19/2014  . Hyponatremia 09/19/2014  . Kidney stone 11/12/2012  . Urge incontinence 11/12/2012  . Urinary tract infection, site not specified 11/12/2012     Orientation RESPIRATION BLADDER Height & Weight     Self, Place  Normal Indwelling catheter Weight: 53.5 kg Height:  5\' 5"  (165.1 cm)  BEHAVIORAL SYMPTOMS/MOOD NEUROLOGICAL BOWEL NUTRITION STATUS      Continent Diet(d3 thin liquid)  AMBULATORY STATUS COMMUNICATION OF NEEDS Skin   Extensive Assist Verbally PU Stage and Appropriate Care                       Personal Care Assistance Level of Assistance              Functional Limitations Info  Hearing   Hearing Info: Impaired      SPECIAL CARE FACTORS FREQUENCY  PT (By licensed PT), OT (By licensed OT)                    Contractures Contractures Info: Not present    Additional Factors Info  Code Status, Allergies Code Status Info: DNR Allergies Info: Penicillin           Current Medications (07/02/2019):  This is the current hospital active medication list Current Facility-Administered Medications  Medication Dose Route Frequency Provider Last Rate Last Admin  . acetaminophen (TYLENOL) tablet 650 mg  650 mg Oral Q6H PRN Agbata, Tochukwu, MD       Or  . acetaminophen (TYLENOL) suppository 650 mg  650 mg Rectal Q6H PRN Agbata, Tochukwu, MD      . aspirin chewable tablet 81 mg  81 mg Oral Daily Agbata, Tochukwu, MD   81 mg at 07/02/19 0953  . azithromycin (ZITHROMAX) tablet 250 mg  250 mg  Oral Daily Wieting, Richard, MD      . calcitRIOL (ROCALTROL) capsule 0.25 mcg  0.25 mcg Oral Daily Agbata, Tochukwu, MD   0.25 mcg at 07/02/19 0952  . ceFEPIme (MAXIPIME) 2 g in sodium chloride 0.9 % 100 mL IVPB  2 g Intravenous Q24H Kristeen Miss R, RPH 200 mL/hr at 07/02/19 1009 2 g at 07/02/19 1009  . Chlorhexidine Gluconate Cloth 2 % PADS 6 each  6 each Topical Daily Agbata, Tochukwu, MD   6 each at 07/01/19 2121  . cholecalciferol (VITAMIN D3) tablet 2,000 Units  2,000 Units Oral Daily Agbata, Tochukwu, MD   2,000 Units at 07/02/19 0952  . heparin injection 5,000 Units  5,000 Units Subcutaneous Q8H Agbata,  Tochukwu, MD   5,000 Units at 07/02/19 0505  . methimazole (TAPAZOLE) tablet 5 mg  5 mg Oral Daily Agbata, Tochukwu, MD   5 mg at 07/02/19 0952  . ondansetron (ZOFRAN) tablet 4 mg  4 mg Oral Q6H PRN Agbata, Tochukwu, MD       Or  . ondansetron (ZOFRAN) injection 4 mg  4 mg Intravenous Q6H PRN Agbata, Tochukwu, MD         Discharge Medications: Please see discharge summary for a list of discharge medications.  Relevant Imaging Results:  Relevant Lab Results:   Additional Information ss [id number]  Beverly Sessions, RN

## 2019-07-02 NOTE — Progress Notes (Signed)
Urology Inpatient Progress Note  Subjective: Sandy Roberson is a 86 y.o. female admitted on 07/01/2019 with an obstructing distal right ureteral stone with urosepsis.  She is POD 1 from right ureteral stent placement with Dr. Erlene Quan.  Intraoperative findings notable for a high-grade ureteral obstruction and diffusely erythematous bladder with copious debris.  Urine culture preliminary positive for gram-negative rods.  On antibiotics as below.  WBC count down today, 18.3.  Creatinine down today, 1.56.  She is afebrile, VSS.  Foley catheter in place draining clear, pale yellow urine.  Patient is sleeping this morning and cannot contribute to HPI.  Anti-infectives: Anti-infectives (From admission, onward)   Start     Dose/Rate Route Frequency Ordered Stop   07/02/19 1000  azithromycin (ZITHROMAX) tablet 250 mg     250 mg Oral Daily 07/01/19 0826 07/06/19 0959   07/01/19 1000  azithromycin (ZITHROMAX) tablet 500 mg     500 mg Oral Daily 07/01/19 0826 07/02/19 0959   07/01/19 0900  ceFEPIme (MAXIPIME) 2 g in sodium chloride 0.9 % 100 mL IVPB     2 g 200 mL/hr over 30 Minutes Intravenous Every 24 hours 07/01/19 0826     07/01/19 0745  levofloxacin (LEVAQUIN) IVPB 750 mg  Status:  Discontinued     750 mg 100 mL/hr over 90 Minutes Intravenous  Once 07/01/19 0730 07/01/19 0805   07/01/19 0622  vancomycin variable dose per unstable renal function (pharmacist dosing)  Status:  Discontinued      Does not apply See admin instructions 07/01/19 0622 07/01/19 0826   07/01/19 0615  metroNIDAZOLE (FLAGYL) IVPB 500 mg     500 mg 100 mL/hr over 60 Minutes Intravenous  Once 07/01/19 0602 07/01/19 0732   07/01/19 0600  vancomycin (VANCOCIN) IVPB 1000 mg/200 mL premix     1,000 mg 200 mL/hr over 60 Minutes Intravenous  Once 07/01/19 0550 07/01/19 0804   07/01/19 0545  cefTRIAXone (ROCEPHIN) 2 g in sodium chloride 0.9 % 100 mL IVPB  Status:  Discontinued     2 g 200 mL/hr over 30 Minutes Intravenous  Every 24 hours 07/01/19 0534 07/01/19 0826      Current Facility-Administered Medications  Medication Dose Route Frequency Provider Last Rate Last Admin  . 0.9 %  sodium chloride infusion   Intravenous Continuous Agbata, Tochukwu, MD 125 mL/hr at 07/02/19 0506 New Bag at 07/02/19 0506  . acetaminophen (TYLENOL) tablet 650 mg  650 mg Oral Q6H PRN Agbata, Tochukwu, MD       Or  . acetaminophen (TYLENOL) suppository 650 mg  650 mg Rectal Q6H PRN Agbata, Tochukwu, MD      . aspirin chewable tablet 81 mg  81 mg Oral Daily Agbata, Tochukwu, MD      . azithromycin (ZITHROMAX) tablet 500 mg  500 mg Oral Daily Duncan, Asajah R, RPH       Followed by  . azithromycin (ZITHROMAX) tablet 250 mg  250 mg Oral Daily Duncan, Asajah R, RPH      . calcitRIOL (ROCALTROL) capsule 0.25 mcg  0.25 mcg Oral Daily Agbata, Tochukwu, MD      . ceFEPIme (MAXIPIME) 2 g in sodium chloride 0.9 % 100 mL IVPB  2 g Intravenous Q24H Duncan, Asajah R, RPH 200 mL/hr at 07/01/19 1011 Restarted at 07/01/19 1011  . Chlorhexidine Gluconate Cloth 2 % PADS 6 each  6 each Topical Daily Agbata, Tochukwu, MD   6 each at 07/01/19 2121  . cholecalciferol (VITAMIN D3) tablet 2,000 Units  2,000 Units Oral Daily Agbata, Tochukwu, MD      . heparin injection 5,000 Units  5,000 Units Subcutaneous Q8H Agbata, Tochukwu, MD   5,000 Units at 07/02/19 0505  . methimazole (TAPAZOLE) tablet 5 mg  5 mg Oral Daily Agbata, Tochukwu, MD      . ondansetron (ZOFRAN) tablet 4 mg  4 mg Oral Q6H PRN Agbata, Tochukwu, MD       Or  . ondansetron (ZOFRAN) injection 4 mg  4 mg Intravenous Q6H PRN Agbata, Tochukwu, MD       Objective: Vital signs in last 24 hours: Temp:  [96.9 F (36.1 C)-97.8 F (36.6 C)] 97.8 F (36.6 C) (03/18 0407) Pulse Rate:  [80-109] 109 (03/18 0407) Resp:  [15-27] 16 (03/18 0407) BP: (88-117)/(51-93) 101/65 (03/18 0407) SpO2:  [95 %-100 %] 99 % (03/18 0407)  Intake/Output from previous day: 03/17 0701 - 03/18 0700 In: 2732.1  [I.V.:2420.6; IV Piggyback:311.5] Out: 1050 [Urine:1050] Intake/Output this shift: No intake/output data recorded.  Physical Exam Vitals and nursing note reviewed.  Constitutional:      General: She is sleeping.  HENT:     Head: Normocephalic and atraumatic.  Pulmonary:     Effort: Pulmonary effort is normal. No respiratory distress.  Skin:    General: Skin is warm and dry.    Lab Results:  Recent Labs    07/01/19 0506 07/02/19 0622  WBC 23.5* 18.3*  HGB 10.5* 8.3*  HCT 31.6* 25.7*  PLT 217 154   BMET Recent Labs    07/01/19 0506 07/02/19 0622  NA 137 140  K 4.4 4.5  CL 102 111  CO2 24 21*  GLUCOSE 142* 83  BUN 65* 52*  CREATININE 2.15* 1.56*  CALCIUM 9.5 8.3*   PT/INR Recent Labs    07/02/19 0622  LABPROT 16.8*  INR 1.4*   Assessment & Plan: 86 year old female admitted with urosepsis secondary to an obstructing distal right ureteral calculus, now s/p right ureteral stent placement and on broad-spectrum antibiotics with urine culture preliminary positive for GNR's.  Patient clinically improving today.  Recommendations: -Discontinue Foley today -Continue to follow urine cultures and narrow antibiotics as indicated; recommend discharge on 14 days of culture-appropriate antibiotics -We will schedule her for outpatient definitive stone management in approximately 2 weeks -Urology to sign off at this time, please contact us if patient acutely decompensates  Debroah Loop, PA-C 07/02/2019

## 2019-07-02 NOTE — Evaluation (Addendum)
Clinical/Bedside Swallow Evaluation Patient Details  Name: Sandy Roberson MRN: [id number] Date of Birth: 1915/11/26  Today's Date: 07/02/2019 Time: SLP Start Time (ACUTE ONLY): 0945 SLP Stop Time (ACUTE ONLY): 1045 SLP Time Calculation (min) (ACUTE ONLY): 60 min  Past Medical History:  Past Medical History:  Diagnosis Date  . Coronary artery disease   . Hyperlipidemia   . Hypertension   . Renal disorder    Past Surgical History:  Past Surgical History:  Procedure Laterality Date  . CORONARY STENT PLACEMENT    . CYSTOSCOPY W/ URETERAL STENT PLACEMENT Right 07/01/2019   Procedure: CYSTOSCOPY WITH RETROGRADE PYELOGRAM/URETERAL STENT PLACEMENT;  Surgeon: Hollice Espy, MD;  Location: [facility] ORS;  Service: Urology;  Laterality: Right;  . ESOPHAGOGASTRODUODENOSCOPY (EGD) WITH PROPOFOL N/A 10/20/2018   Procedure: ESOPHAGOGASTRODUODENOSCOPY (EGD) WITH PROPOFOL;  Surgeon: Lucilla Lame, MD;  Location: [facility] ENDOS[facility];  Service: Endoscopy;  Laterality: N/A;  . FRACTURE SURGERY    . LOWER EXTREMITY ANGIOGRAPHY Right 11/24/2018   Procedure: LOWER EXTREMITY ANGIOGRAPHY;  Surgeon: Algernon Huxley, MD;  Location: [place] [facility];  Service: Cardiovascular;  Laterality: Right;  . LOWER EXTREMITY ANGIOGRAPHY Right 01/05/2019   Procedure: LOWER EXTREMITY ANGIOGRAPHY;  Surgeon: Algernon Huxley, MD;  Location: [place] [facility];  Service: Cardiovascular;  Laterality: Right;  . LOWER EXTREMITY ANGIOGRAPHY Left 03/20/2019   Procedure: LOWER EXTREMITY ANGIOGRAPHY;  Surgeon: Algernon Huxley, MD;  Location: [place] [facility];  Service: Cardiovascular;  Laterality: Left;  . LOWER EXTREMITY ANGIOGRAPHY Left 04/23/2019   Procedure: LOWER EXTREMITY ANGIOGRAPHY;  Surgeon: Algernon Huxley, MD;  Location: [place] [facility];  Service: Cardiovascular;  Laterality: Left;  . STENT PLACEMENT RT URETER [facility])     since been removed   . TRANSMETATARSAL AMPUTATION Right 02/26/2019   Procedure: 1st METATARSAL RESECTION;   Surgeon: Albertine Patricia, DPM;  Location: [facility] ORS;  Service: Podiatry;  Laterality: Right;   HPI:  Pt is a 86 y.o. 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  She lives at home with her son and is surprisingly functional for her age.  He brought her to the emergency room for evaluation of vomiting and inability to tolerate any oral intake.  He notes that any time she eats or drinks something it results in immediate vomiting. Son reports that she has been complaining of right-sided pain for a few days, possibly up to a week but initially they were concerned it was related to a fall.  Now in retrospect, it was likely the stone all along.  She is also been vomiting, increased confusion and weakness which has progressed over the past several days. Pt was taken to Surgery by Urology post admit for High-grade ureteral obstruction with minimal to no contrast beyond the level of the stone which was visible on fluoroscopy.; diffusely erythematous bladder with copious debris.  Currently, pt is awake/alert, verbal and eager to work w/ SLP. She endorsed she did not want "much" po's and that she easily gets "full". Noted pt has a long-standing h/o Esophageal Phase Dysmotility w/ food impaction; EGDs and GI f/u.    Assessment / Plan / Recommendation Clinical Impression  Pt appeared to present w/ Adequate oropharyngeal phase swallowing function w/ No overt, clinical s/s of aspiration during/post oral intake. No neuromuscular deficits impacting oropharyngeal swallowing noted. Pt has BASELINE ESOPHAGEAL PHASE  DYSMOTILITY AND DYSPHAGIA --- see EGD in 10/2018 in which "intrinsic moderate stenosis was found at the gastroesophageal junction". Food impaction was removed and dilation performed  then by GI. (pt was not fully aware of this when discussed). Pt was also seen in 02/2019 by this SLP w/ similar Esophageal complaints at [facility].  ANY Esophageal phase dysmotility and Regurgitation can impact the pharyngeal phase of swallowing and increased risk for aspiration of REFLUX/REGUGITATED material.  During this eval, pt consumed trials of thin liquids, purees, and softened solids w/ No overt coughing or other clinical s/s of aspiration during/post intake. Timely swallowing appreciated. Oral phase was [facility] for bolus management and oral clearing b/t trials. OM exam revealed No unilateral weakness. Pt fed self given setup support. Pt preferred/recommended room temp liquids - not too cold for ease on the Esophageal motility. This, and much dicsussion re: Esophageal phase motility and recommendations, were discusseed w/ pt; Handouts given. Recommend a more Minced foods/meats diet w/ thin liquids; more soups in diet, even purees added. This may aid Esophageal clearing. Recommend strict REFLUX precautions; general aspiration precautions. Pills in puree broken small and/or in crushed/liquid forms if any discomfort during the Esophageal phase. Recommend f/u w/ GI for ongoing management and education of the ESOPHAGEAL phase dysmotility.  Pt appears at her baseline per previous assessment by ST services. Discussed session and recommendations w/ MD/NSG who will f/u w/ Son when present again. Handouts given to pt to take home on recommendations.  SLP Visit Diagnosis: Dysphagia, pharyngoesophageal phase (R13.14)(Esophageal phase deficits)    Aspiration Risk  (reduced following precautions)    Diet Recommendation  Mech Soft foods w/ more Minced and Pureed foods for easier Esophageal clearing; Thin liquids. STRICT REFLUX Precautions; general aspiration precautions  Medication Administration: Whole meds with liquid(but one at a time and cut small)    Other  Recommendations Recommended Consults: Consider GI  evaluation;Consider esophageal assessment(Dietician f/u) Oral Care Recommendations: Oral care BID;Oral care before and after PO;Patient independent with oral care;Staff/trained caregiver to provide oral care Other Recommendations: (n/a)   Follow up Recommendations None      Frequency and Duration (n/a)  (n/a)       Prognosis Prognosis for Safe Diet Advancement: Fair Barriers to Reach Goals: Time post onset;Severity of deficits(Esophageal dysmotility)      Swallow Study   General Date of Onset: 07/01/19 HPI: Pt is a 86 y.o. 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  She lives at home with her son and is surprisingly functional for her age.  He brought her to the emergency room for evaluation of vomiting and inability to tolerate any oral intake.  He notes that any time she eats or drinks something it results in immediate vomiting. Son reports that she has been complaining of right-sided pain for a few days, possibly up to a week but initially they were concerned it was related to a fall.  Now in retrospect, it was likely the stone all along.  She is also been vomiting, increased confusion and weakness which has progressed over the past several days. Pt was taken to Surgery by Urology post admit for High-grade ureteral obstruction with minimal to no contrast beyond the level of the stone which was visible on fluoroscopy.; diffusely erythematous bladder with copious debris.  Currently, pt is awake/alert, verbal and eager to work  w/ SLP. She endorsed she did not want "much" po's and that she easily gets "full". Noted pt has a long-standing h/o Esophageal Phase Dysmotility w/ food impaction; EGDs and GI f/u.  Type of Study: Bedside Swallow Evaluation Previous  Swallow Assessment: 02/2019 Diet Prior to this Study: NPO(soft foods at home per pt) Temperature Spikes Noted: No(wbc 18.3 declining since admit) Respiratory Status: Room air History of Recent Intubation: Yes Length of Intubations (days): 1 days(for surgery) Date extubated: 07/01/19 Behavior/Cognition: Alert;Cooperative;Pleasant mood;Confused;Distractible;Requires cueing(but also very HOH) Oral Cavity Assessment: Within Functional Limits;Dry(min) Oral Care Completed by SLP: Yes Oral Cavity - Dentition: Adequate natural dentition Vision: Functional for self-feeding Self-Feeding Abilities: Able to feed self;Needs set up Patient Positioning: Upright in bed(needed min support ) Baseline Vocal Quality: Normal Volitional Cough: Strong Volitional Swallow: Able to elicit    Oral/Motor/Sensory Function Overall Oral Motor/Sensory Function: Within functional limits   Ice Chips Ice chips: Within functional limits Presentation: Spoon(fed; 2 trials)   Thin Liquid Thin Liquid: Within functional limits Presentation: Cup;Self Fed;Straw(~4-5 ozs total)    Nectar Thick Nectar Thick Liquid: Not tested   Honey Thick Honey Thick Liquid: Not tested   Puree Puree: Within functional limits Presentation: Spoon;Self Fed(7 trials)   Solid     Solid: Within functional limits(softened well for esophageal clearing) Presentation: Self Fed;Spoon(4 trials) Other Comments: moistened, broken down        Orinda Kenner, MS, CCC-SLP [place] 07/02/2019,12:01 PM

## 2019-07-02 NOTE — TOC Initial Note (Signed)
Transition of Care [facility]) - Initial/Assessment Note    Patient Details  Name: Sandy Roberson MRN: [id number] Date of Birth: 01/25/1916  [facility]) CM/SW Contact:    Beverly Sessions, RN Phone Number: 07/02/2019, 10:22 AM  Clinical Narrative:                 Patient admitted from home  Assessment completed via phone with son  PCP [place] - [facility] transports [facility]-  Denies issues obtaining medications  Son states at home patient has RW, WC, hospital bed, and 3 in 1.  States that she uses RW at all times for ambulation.  Is able to take her self to the bathroom, feed her self, walks short distances.  Son states he feels like the patient "has been losing muscle mass in her legs".  Would benefit from PT eval.    Patient open with home health services RN though [place].  Corene Cornea with [facility] is aware of admission  Will require resumption of home health orders at discharge   Expected Discharge Plan: [place]     Patient Goals and CMS Choice        Expected Discharge Plan and Services Expected Discharge Plan: [place]   Discharge Planning Services: CM Consult   Living arrangements for the past 2 months: Single Family Home                               Date [place]: 07/02/19   Representative spoke with at [place]: Corene Cornea  Prior Living Arrangements/Services Living arrangements for the past 2 months: Single Family Home Lives with:: Siblings              Current home services: DME, Home RN    Activities of Daily Living      Permission Sought/Granted                  Emotional Assessment       Orientation: : Fluctuating Orientation (Suspected and/or reported [place])      Admission diagnosis:  Fall [W19.XXXA] Elevated troponin level [R77.8] Atypical pneumonia [J18.9] Right hip pain [M25.551] Sepsis ([place]) [A41.9] Acute renal failure,  unspecified acute renal failure type ([place]) [N17.9] Skin ulcer, unspecified ulcer stage ([place]) [L98.499] Sepsis with acute renal failure without septic shock, due to unspecified organism, unspecified acute renal failure type ([place]) [A41.9, R65.20, N17.9] Patient Active Problem List   Diagnosis Date Noted  . Sepsis ([place]) 07/01/2019  . AKI (acute kidney injury) ([place]) 07/01/2019  . Fall at home, initial encounter 07/01/2019  . Hyperthyroidism 07/01/2019  . Anemia of chronic disease 07/01/2019  . Foot infection 02/25/2019  . Cellulitis 10/29/2018  . Food impaction of esophagus   . Stricture and stenosis of esophagus   . Esophageal obstruction due to food impaction 10/19/2018  . Atherosclerosis of native arteries of the extremities with ulceration ([place]) 03/28/2018  . Chronic kidney disease 03/11/2018  . CKD (chronic kidney disease) stage 3, GFR 30-59 ml/min 03/11/2018  . Coronary artery disease 03/11/2018  . Hyperlipidemia 03/11/2018  . Hypertension 03/11/2018  . Thyroid disease 03/11/2018  . SOB (shortness of breath) 04/05/2016  . Flu vaccine need 02/17/2016  . H/O dehydration 08/12/2015  . ITP (idiopathic thrombocytopenic purpura) 09/25/2014  . ARF (acute renal failure) ([place]) 09/25/2014  . Hypokalemia 09/25/2014  . Acute diastolic CHF (congestive heart  failure) ([place]) 09/25/2014  . Sepsis due to urinary tract infection ([place]) 09/19/2014  . Hyponatremia 09/19/2014  . Kidney stone 11/12/2012  . Urge incontinence 11/12/2012  . Urinary tract infection, site not specified 11/12/2012   PCP:  Tracie Harrier, MD Pharmacy:   [id number] [place] [place] - [address] [place] [address] Phone: [phone] Fax: [phone]     Social Determinants of Health (SDOH) Interventions    Readmission Risk Interventions Readmission Risk Prevention Plan 07/02/2019 02/27/2019  Transportation Screening Complete Complete  PCP or Specialist Appt within 5-7 Days -  Complete  Home Care Screening - Complete  Medication Review (RN CM) - Complete  HRI or Home Care Consult Complete -  Medication Review (RN Care Manager) Complete -  Some recent data might be hidden

## 2019-07-02 NOTE — TOC Progression Note (Signed)
Transition of Care [facility]) - Progression Note    Patient Details  Name: Sandy Roberson MRN: [id number] Date of Birth: 1916-02-01  Transition of Care [facility]) CM/SW Contact  Beverly Sessions, RN Phone Number: 07/02/2019, 1:45 PM  Clinical Narrative:     PT has recommended SNF. Spoke with son regarding this recommendation  Son is agreeable to bed search.  Son is pretty confident that he will be taking the patient home, with increased services through [place].  Son wishes to discuss tomorrow with the patient, when he has her hearing aides in.  Then will make the final decision.   PASRR obtained FL2 sent for signature Bed search initiated   Expected Discharge Plan: [place]    Expected Discharge Plan and Services Expected Discharge Plan: [place]   Discharge Planning Services: CM Consult   Living arrangements for the past 2 months: Single Family Home                               Date [place]: 07/02/19   Representative spoke with at [place]: [place] ([place]) Interventions    Readmission Risk Interventions Readmission Risk Prevention Plan 07/02/2019 02/27/2019  Transportation Screening Complete Complete  PCP or Specialist Appt within 5-7 Days - Complete  Home Care Screening - Complete  Medication Review (RN CM) - Complete  HRI or Home Care Consult Complete -  Medication Review (RN Care Manager) Complete -  Some recent data might be hidden

## 2019-07-03 ENCOUNTER — Ambulatory Visit: Payer: Medicare Other | Admitting: Physician Assistant

## 2019-07-03 ENCOUNTER — Inpatient Hospital Stay: Payer: Self-pay

## 2019-07-03 DIAGNOSIS — D638 Anemia in other chronic diseases classified elsewhere: Secondary | ICD-10-CM

## 2019-07-03 DIAGNOSIS — N138 Other obstructive and reflux uropathy: Secondary | ICD-10-CM

## 2019-07-03 LAB — URINE CULTURE: Culture: >=100000 — AB

## 2019-07-03 LAB — CBC
HCT: 26.3 % — ABNORMAL LOW (ref 36.0–46.0)
Hemoglobin: 8.4 g/dL — ABNORMAL LOW (ref 12.0–15.0)
MCH: 30.1 pg (ref 26.0–34.0)
MCHC: 31.9 g/dL (ref 30.0–36.0)
MCV: 94.3 fL (ref 80.0–100.0)
Platelets: 153 10*3/uL (ref 150–400)
RBC: 2.79 MIL/uL — ABNORMAL LOW (ref 3.87–5.11)
RDW: 16.4 % — ABNORMAL HIGH (ref 11.5–15.5)
WBC: 9.9 10*3/uL (ref 4.0–10.5)
nRBC: 0 % (ref 0.0–0.2)

## 2019-07-03 LAB — BASIC METABOLIC PANEL
Anion gap: 9 (ref 5–15)
BUN: 54 mg/dL — ABNORMAL HIGH (ref 8–23)
CO2: 22 mmol/L (ref 22–32)
Calcium: 8.6 mg/dL — ABNORMAL LOW (ref 8.9–10.3)
Chloride: 111 mmol/L (ref 98–111)
Creatinine, Ser: 1.38 mg/dL — ABNORMAL HIGH (ref 0.44–1.00)
GFR calc Af Amer: 36 mL/min — ABNORMAL LOW (ref >60)
GFR calc non Af Amer: 31 mL/min — ABNORMAL LOW (ref >60)
Glucose, Bld: 75 mg/dL (ref 70–99)
Potassium: 4.6 mmol/L (ref 3.5–5.1)
Sodium: 142 mmol/L (ref 135–145)

## 2019-07-03 LAB — SEDIMENTATION RATE: Sed Rate: 71 mm/hr — ABNORMAL HIGH (ref 0–30)

## 2019-07-03 MED ORDER — SODIUM CHLORIDE 0.9% FLUSH: 10.0000 mL | INTRAVENOUS | Status: DC | PRN

## 2019-07-03 MED ORDER — CARVEDILOL 6.25 MG PO TABS: 3.1250 mg | ORAL_TABLET | Freq: Two times a day (BID) | ORAL | Status: DC

## 2019-07-03 MED ORDER — CHLORHEXIDINE GLUCONATE CLOTH 2 % EX PADS: 6.0000 | MEDICATED_PAD | Freq: Every day | CUTANEOUS | Status: DC

## 2019-07-03 MED ORDER — SODIUM CHLORIDE 0.9% FLUSH: 10.0000 mL | Freq: Two times a day (BID) | INTRAVENOUS | Status: DC

## 2019-07-03 MED ORDER — ERTAPENEM IV (FOR PTA / DISCHARGE USE ONLY): 500.0000 mg | INTRAVENOUS | 0 refills | Status: AC

## 2019-07-03 MED ORDER — SODIUM CHLORIDE 0.9 % IV SOLN
500.0000 mg | INTRAVENOUS | Status: DC
  Administered 2019-07-03: 500 mg via INTRAVENOUS
  Filled 2019-07-03 (×2): qty 0.5

## 2019-07-03 MED ORDER — SODIUM CHLORIDE FLUSH 0.9 % IV SOLN: INTRAVENOUS | 0 refills | Status: AC

## 2019-07-03 MED ORDER — SODIUM CHLORIDE 0.9 % IV SOLN: 500.0000 mg | INTRAVENOUS | 0 refills | Status: DC

## 2019-07-03 NOTE — TOC Transition Note (Signed)
Transition of Care [facility]) - CM/SW Discharge Note   Patient Details  Name: Sandy Roberson MRN: [id number] Date of Birth: 1916/01/10  Transition [facility] Care [facility]) CM/SW Contact:  Beverly Sessions, RN Phone Number: 07/03/2019, 4:58 PM   Clinical Narrative:    Patient to discharge today Patient does not have 3 night qualifying stay for SNF  PT has upgraded their recommendation to home health..  Son in agreement  PICC line to be placed today.  Today's dose of antibiotic has already been given  Referral made to [facility] with [facility]. Corene Cornea with [place] aware of discharge. Start of care for tomorrow     Final next level of care: [place]     Patient Goals and CMS Choice        Discharge Placement                       Discharge Plan and Services   Discharge Planning Services: CM Consult                      HH Arranged: RN, PT, Nurse's Aide [facility] Agency: [place] ([facility]) Date HH Agency Contacted: 07/03/19   Representative spoke with at [place]: [place] ([place]) Interventions     Readmission Risk Interventions Readmission Risk Prevention Plan 07/03/2019 07/02/2019 02/27/2019  Transportation Screening Complete Complete Complete  PCP or Specialist Appt within 5-7 Days - - Complete  PCP or Specialist Appt within 3-5 Days Complete - -  Home Care Screening - - Complete  Medication Review (RN CM) - - Complete  HRI or Home Care Consult Complete Complete -  Palliative Care Screening Not Applicable - -  Medication Review (RN Care Manager) Complete Complete -  Some recent data might be hidden

## 2019-07-03 NOTE — Progress Notes (Signed)
PHARMACY CONSULT NOTE FOR:  OUTPATIENT  PARENTERAL ANTIBIOTIC THERAPY (OPAT)  Indication: ESBL E coli UTI Regimen: ertapenem 500 mg IV daily End date: 07/16/2019  Fax labs to PCP at [phone] Weekly CBC/diff and CMP  IV antibiotic discharge orders are pended. To discharging provider:  please sign these orders via discharge navigator,  Select New Orders & click on the button choice - Manage This Unsigned Work.     Thank you for allowing pharmacy to be a part of this patient's care.  Rocky Morel 07/03/2019, 1:28 PM

## 2019-07-03 NOTE — Progress Notes (Signed)
Chella Geurin A and O x2. VSS. Pt tolerating diet well. No complaints of nausea or vomiting. IV removed intact, prescriptions given. Pt voices understanding of discharge instructions with no further questions. Patient discharged via wheelchair with NT  Allergies as of 07/03/2019      Reactions   Penicillins Rash   Did it involve swelling of the face/tongue/throat, SOB, or low BP? No Did it involve sudden or severe rash/hives, skin peeling, or any reaction on the inside of your mouth or nose? No Did you need to seek medical attention at a hospital or doctor's office? No When did it last happen?10 Years If all above answers are "NO", may proceed with cephalosporin use.      Medication List    STOP taking these medications   atorvastatin 10 MG tablet Commonly known as: Lipitor   carvedilol 3.125 MG tablet Commonly known as: Coreg   cefdinir 300 MG capsule Commonly known as: OMNICEF   clopidogrel 75 MG tablet Commonly known as: Plavix   trimethoprim 100 MG tablet Commonly known as: TRIMPEX     TAKE these medications   aspirin 81 MG chewable tablet Commonly known as: Aspirin 81 Chew 1 tablet (81 mg total) by mouth daily.   calcitRIOL 0.25 MCG capsule Commonly known as: ROCALTROL Take 0.25 mcg by mouth daily.   ertapenem  IVPB Commonly known as: INVANZ Inject 500 mg into the vein daily for 12 days. Indication:  ESBL E coli UTI Last Day of Therapy:  07/16/2019 Labs - Once weekly:  CBC/D and CMP, Fax labs to PCP at [phone] Start taking on: July 04, 2019   sodium chloride flush 0.9 % Soln injection 36ml flush before and after antibiotic            Home Infusion Instuctions  (From admission, onward)         Start     Ordered   07/03/19 0000  Home infusion instructions    Question:  Instructions  Answer:  Flushing of vascular access device: 0.9% NaCl pre/post medication administration and prn patency; Heparin 100 u/ml, 70ml for implanted ports and  Heparin 10u/ml, 42ml for all other central venous catheters.   07/03/19 1245   07/03/19 0000  Home infusion instructions    Question:  Instructions  Answer:  Flushing of vascular access device: 0.9% NaCl pre/post medication administration and prn patency; Heparin 100 u/ml, 56ml for implanted ports and Heparin 10u/ml, 75ml for all other central venous catheters.   07/03/19 1615          Vitals:   07/03/19 0540 07/03/19 1500  BP: 130/79 125/77  Pulse: (!) 110 90  Resp: 20 18  Temp: 97.8 F (36.6 C) (!) 97 F (36.1 C)  SpO2: 96% 97%    Sandy Roberson

## 2019-07-03 NOTE — Discharge Instructions (Signed)
Sepsis, Diagnosis, Adult Sepsis is a serious bodily reaction to an infection. The infection that triggers sepsis may be from a bacteria, virus, or fungus. Sepsis can result from an infection in any part of your body. Infections that commonly lead to sepsis include skin, lung, and urinary tract infections. Sepsis is a medical emergency that must be treated right away in a hospital. In severe cases, it can lead to septic shock. Septic shock can weaken your heart and cause your blood pressure to drop. This can cause your central nervous system and your body's organs to stop working. What are the causes? This condition is caused by a severe reaction to infections from bacteria, viruses, or fungus. The germs that most often lead to sepsis include:  Escherichia coli (E. coli) bacteria.  Staphylococcus aureus (staph) bacteria.  Some types of Streptococcus bacteria. The most common infections affect these organs:  The lung (pneumonia).  The kidneys or bladder (urinary tract infection).  The skin (cellulitis).  The bowel, gallbladder, or pancreas. What increases the risk? You are more likely to develop this condition if:  Your body's disease-fighting system (immune system) is weakened.  You are age 65 or older.  You are female.  You had surgery or you have been hospitalized.  You have these devices inserted into your body: ? A small, thin tube (catheter). ? IV line. ? Breathing tube. ? Drainage tube.  You are not getting enough nutrients from food (malnourished).  You have a long-term (chronic) disease, such as cancer, lung disease, kidney disease, or diabetes.  You are African American. What are the signs or symptoms? Symptoms of this condition may include:  Fever.  Chills or feeling very cold.  Confusion or anxiety.  Fatigue.  Muscle aches.  Shortness of breath.  Nausea and vomiting.  Urinating much less than usual.  Fast heart rate (tachycardia).  Rapid  breathing (hyperventilation).  Changes in skin color. Your skin may look blotchy, pale, or blue.  Cool, clammy, or sweaty skin.  Skin rash. Other symptoms depend on the source of your infection. How is this diagnosed? This condition is diagnosed based on:  Your symptoms.  Your medical history.  A physical exam. Other tests may also be done to find out the cause of the infection and how severe the sepsis is. These tests may include:  Blood tests.  Urine tests.  Swabs from other areas of your body that may have an infection. These samples may be tested (cultured) to find out what type of bacteria is causing the infection.  Chest X-ray to check for pneumonia. Other imaging tests, such as a CT scan, may also be done.  Lumbar puncture. This removes a small amount of the fluid that surrounds your brain and spinal cord. The fluid is then examined for infection. How is this treated? This condition must be treated in a hospital. Based on the cause of your infection, you may be given an antibiotic, antiviral, or antifungal medicine. You may also receive:  Fluids through an IV.  Oxygen and breathing assistance.  Medicines to increase your blood pressure.  Kidney dialysis. This process cleans your blood if your kidneys have failed.  Surgery to remove infected tissue.  Blood transfusion if needed.  Medicine to prevent blood clots.  Nutrients to correct imbalances in basic body function (metabolism). You may: ? Receive important salts and minerals (electrolytes) through an IV. ? Have your blood sugar level adjusted. Follow these instructions at home: Medicines   Take over-the-counter and   prescription medicines only as told by your health care provider.  If you were prescribed an antibiotic, antiviral, or antifungal medicine, take it as told by your health care provider. Do not stop taking the medicine even if you start to feel better. General instructions  If you have a  catheter or other indwelling device, ask to have it removed as soon as possible.  Keep all follow-up visits as told by your health care provider. This is important. Contact a health care provider if:  You do not feel like you are getting better or regaining strength.  You are having trouble coping with your recovery.  You frequently feel tired.  You feel worse or do not seem to get better after surgery.  You think you may have an infection after surgery. Get help right away if:  You have any symptoms of sepsis.  You have difficulty breathing.  You have a rapid or skipping heartbeat.  You become confused or disoriented.  You have a high fever.  Your skin becomes blotchy, pale, or blue.  You have an infection that is getting worse or not getting better. These symptoms may represent a serious problem that is an emergency. Do not wait to see if the symptoms will go away. Get medical help right away. Call your local emergency services (911 in the [place] Do not drive yourself to the hospital. Summary  Sepsis is a medical emergency that requires immediate treatment in a hospital.  This condition is caused by a severe reaction to infections from bacteria, viruses, or fungus.  Based on the cause of your infection, you may be given an antibiotic, antiviral, or antifungal medicine.  Treatment may also include IV fluids, breathing assistance, and kidney dialysis. This information is not intended to replace advice given to you by your health care provider. Make sure you discuss any questions you have with your health care provider. Document Revised: 11/08/2017 Document Reviewed: 11/08/2017 Elsevier Patient Education  2020 [employer]  

## 2019-07-03 NOTE — Progress Notes (Signed)
Physical Therapy Treatment Patient Details Name: Sandy Roberson MRN: [id number] DOB: 1915/10/10 Today's Date: 07/03/2019    History of Present Illness 86 y.o. 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  She lives at home with her son and is surprisingly functional for her age.  He brought her to the emergency room for evaluation of vomiting and inability to tolerate any oral intake.  He notes that any time she eats or drinks something it gets caught in her throat resulting in immediate vomiting.  He was concerned that she may be dehydrated.  Patient son also states that she fell several days prior to coming to the hospital and has been complaining of severe pain in her right hip.  Pain is worse movement.  Per patient's son she is still able to ambulate but certain movements elicit pain in the right hip. CT of right hip negative for fracture; Patient diagnosed with sepsis and is s/p right ureteral stent placement on 07/01/19.    PT Comments    Pt did much better with PT today and was able to ambulate ~40 ft with slow, limping but safe cadence.  She was very motivated to go home and actually did quite well with the bout of ambulation.  Her HR increased to 120s, but she did not endorse excessive fatigue or increased pain.  Pt was slow and effortful to get to sitting EOB and then to get LEs back into bed after sitting, but overall needed little to no direct assist t/o the session.     Follow Up Recommendations  Home health PT;Supervision/Assistance - 24 hour     Equipment Recommendations  None recommended by PT    Recommendations for Other Services       Precautions / Restrictions Precautions Precautions: Fall Restrictions Weight Bearing Restrictions: No     Mobility  Bed Mobility Overal bed mobility: Needs Assistance Bed Mobility: Supine to Sit;Sit to Supine     Supine to sit: Min guard Sit to supine: Min guard   General bed mobility comments: Pt needing extra time and effort to get to sitting EOB and back to supine  Transfers Overall transfer level: Needs assistance Equipment used: Rolling walker (2 wheeled) Transfers: Sit to/from [facility] Sit to Stand: Min assist;Min guard         General transfer comment: Pt needed cues to insure she used UEs appropriate to assist to standing, she needed only very light assist to keep weight shifting forward during transition to standing  Ambulation/Gait Ambulation/Gait assistance: Min guard Gait Distance (Feet): 40 Feet Assistive device: Rolling walker (2 wheeled)       General Gait Details: Pt was able to ambulate with slow but stable gait.  She had limp with R WBing/stance phase but with use of walker was able to tolerate; and with cuing to increase L step length she was inconsistently able to do so.  HR increases to the 120s with the effort but pt does not c/o pain or excessive fatigue.  No LOBs or overt safety concerns.   Stairs             Wheelchair Mobility    Modified Rankin (Stroke Patients Only)       Balance Overall balance assessment: Needs assistance   Sitting balance-Leahy Scale: Good Sitting balance - Comments: Pt able to maintain  sitting EOB w/o supervision       Standing balance comment: Pt again with hunched posture but no LOBs or unsteadiness                            Cognition Arousal/Alertness: Awake/alert Behavior During Therapy: WFL for tasks assessed/performed Overall Cognitive Status: History of cognitive impairments - at baseline                                 General Comments: unsure of true baseline, pt pleasantly confused t/o session      Exercises      General Comments         Pertinent Vitals/Pain Pain Assessment: No/denies pain Pain Location: Pt does not v/o pain, though she does have moderate limp with WBing on R    Home Living                      Prior Function            PT Goals (current goals can now be found in the care plan section) Progress towards PT goals: Progressing toward goals    Frequency    Min 2X/week      PT Plan Current plan remains appropriate    Co-evaluation              AM-PAC PT "6 Clicks" Mobility   Outcome Measure  Help needed turning from your back to your side while in a flat bed without using bedrails?: None Help needed moving from lying on your back to sitting on the side of a flat bed without using bedrails?: A Little Help needed moving to and from a bed to a chair (including a wheelchair)?: A Little Help needed standing up from a chair using your arms (e.g., wheelchair or bedside chair)?: A Little Help needed to walk in hospital room?: A Little Help needed climbing 3-5 steps with a railing? : A Little 6 Click Score: 19    End of Session Equipment Utilized During Treatment: Gait belt Activity Tolerance: Patient tolerated treatment well;No increased pain Patient left: with chair alarm set;with bed alarm set(Pt wanting to get back into bed after ambulation) Nurse Communication: Mobility status PT Visit Diagnosis: Unsteadiness on feet (R26.81);Muscle weakness (generalized) (M62.81);History of falling (Z91.81)     Time: 1414-1440 PT Time Calculation (min) (ACUTE ONLY): 26 min  Charges:  $Gait Training: 8-22 mins $Therapeutic Activity: 8-22 mins                     Kreg Shropshire, DPT 07/03/2019, 2:58 PM

## 2019-07-03 NOTE — Discharge Summary (Signed)
[facility] NAME: Sandy Roberson    MR#:  [id number]  DATE OF BIRTH:  July 26, 1915  DATE OF ADMISSION:  07/01/2019 ADMITTING PHYSICIAN: Collier Bullock, MD  DATE OF DISCHARGE: 07/03/2019  PRIMARY CARE PHYSICIAN: Tracie Harrier, MD    ADMISSION DIAGNOSIS:  Fall [W19.XXXA] Elevated troponin level [R77.8] Atypical pneumonia [J18.9] Right hip pain [M25.551] Sepsis ([place]) [A41.9] Acute renal failure, unspecified acute renal failure type ([place]) [N17.9] Skin ulcer, unspecified ulcer stage ([place]) [L98.499] Sepsis with acute renal failure without septic shock, due to unspecified organism, unspecified acute renal failure type ([place]) [A41.9, R65.20, N17.9]  DISCHARGE DIAGNOSIS:  Principal Problem:   Sepsis ([place]) Active Problems:   CKD (chronic kidney disease) stage 3, GFR 30-59 ml/min   AKI (acute kidney injury) ([place])   Fall at home, initial encounter   Hyperthyroidism   Anemia of chronic disease   Pressure injury of skin   Lactic acidosis   Weakness   Ulcer of both feet ([place])   SECONDARY DIAGNOSIS:   Past Medical History:  Diagnosis Date  . Coronary artery disease   . Hyperlipidemia   . Hypertension   . Renal disorder     HOSPITAL COURSE:   1.  Clinical sepsis secondary to obstructing kidney stone and urinary tract infection.  Patient also had acute kidney injury on chronic kidney disease stage IIIb.  Patient was given IV fluids.  Given cefepime and azithromycin.  Urine culture growing E. coli and Proteus with resistance profiles.  ESBL on the E. coli.  Case discussed with son patient and pharmacist to prescribe ertapenem.  PICC line to be placed today.  Urology wanted 14 days of antibiotic.  Patient will get first dose today and 13 more doses with home health as outpatient. 2.  Obstructive kidney stone status post urgent stent upon admission.  Foley removed yesterday.  Follow-up with urology for stone treatment and stent  removal. 3.  Lactic acidosis on presentation.  The patient received IV fluids 4.  Weakness.  Physical therapy initially recommended rehab but then I recommended home with home health.  Son will take the patient home with home health. 5.  Bilateral first toe ulcerations present on admission.  Proteus growing out of that from previous wound culture 7 days ago.  Ertapenem would also cover that.  X-ray at that time did not show any signs of osteomyelitis. 6.  Anemia.  Follow-up as outpatient.  DISCHARGE CONDITIONS:   Satisfactory  CONSULTS OBTAINED:  Treatment Team:  Hollice Espy, MD  DRUG ALLERGIES:   Allergies  Allergen Reactions  . Penicillins Rash    Did it involve swelling of the face/tongue/throat, SOB, or low BP? No Did it involve sudden or severe rash/hives, skin peeling, or any reaction on the inside of your mouth or nose? No Did you need to seek medical attention at a hospital or doctor's office? No When did it last happen?10 Years If all above answers are "NO", may proceed with cephalosporin use.    DISCHARGE MEDICATIONS:   Allergies as of 07/03/2019      Reactions   Penicillins Rash   Did it involve swelling of the face/tongue/throat, SOB, or low BP? No Did it involve sudden or severe rash/hives, skin peeling, or any reaction on the inside of your mouth or nose? No Did you need to seek medical attention at a hospital or doctor's office? No When did it last happen?10 Years If all above answers are "NO", may proceed  with cephalosporin use.      Medication List    STOP taking these medications   atorvastatin 10 MG tablet Commonly known as: Lipitor   carvedilol 3.125 MG tablet Commonly known as: Coreg   cefdinir 300 MG capsule Commonly known as: OMNICEF   clopidogrel 75 MG tablet Commonly known as: Plavix   trimethoprim 100 MG tablet Commonly known as: TRIMPEX     TAKE these medications   aspirin 81 MG chewable tablet Commonly known as:  Aspirin 81 Chew 1 tablet (81 mg total) by mouth daily.   calcitRIOL 0.25 MCG capsule Commonly known as: ROCALTROL Take 0.25 mcg by mouth daily.   ertapenem  IVPB Commonly known as: INVANZ Inject 500 mg into the vein daily for 12 days. Indication:  ESBL E coli UTI Last Day of Therapy:  07/16/2019 Labs - Once weekly:  CBC/D and CMP, Fax labs to PCP at [phone] Start taking on: July 04, 2019   sodium chloride flush 0.9 % Soln injection 72ml flush before and after antibiotic            Home Infusion Instuctions  (From admission, onward)         Start     Ordered   07/03/19 0000  Home infusion instructions    Question:  Instructions  Answer:  Flushing of vascular access device: 0.9% NaCl pre/post medication administration and prn patency; Heparin 100 u/ml, 58ml for implanted ports and Heparin 10u/ml, 25ml for all other central venous catheters.   07/03/19 1245   07/03/19 0000  Home infusion instructions    Question:  Instructions  Answer:  Flushing of vascular access device: 0.9% NaCl pre/post medication administration and prn patency; Heparin 100 u/ml, 87ml for implanted ports and Heparin 10u/ml, 26ml for all other central venous catheters.   07/03/19 1615           DISCHARGE INSTRUCTIONS:   Follow-up with PMD 5 days Follow-up urology 2 weeks  If you experience worsening of your admission symptoms, develop shortness of breath, life threatening emergency, suicidal or homicidal thoughts you must seek medical attention immediately by calling 911 or calling your MD immediately  if symptoms less severe.  You Must read complete instructions/literature along with all the possible adverse reactions/side effects for all the Medicines you take and that have been prescribed to you. Take any new Medicines after you have completely understood and accept all the possible adverse reactions/side effects.   Please note  You were cared for by a hospitalist during your hospital stay. If  you have any questions about your discharge medications or the care you received while you were in the hospital after you are discharged, you can call the unit and asked to speak with the hospitalist on call if the hospitalist that took care of you is not available. Once you are discharged, your primary care physician will handle any further medical issues. Please note that NO REFILLS for any discharge medications will be authorized once you are discharged, as it is imperative that you return to your primary care physician (or establish a relationship with a primary care physician if you do not have one) for your aftercare needs so that they can reassess your need for medications and monitor your lab values.    Today   CHIEF COMPLAINT:   Chief Complaint  Patient presents with  . Hip Pain  . Emesis    HISTORY OF PRESENT ILLNESS:  Sandy Roberson  is a 86 y.o. female came in  with vomiting and found to have sepsis.   VITAL SIGNS:  Blood pressure 125/77, pulse 90, temperature (!) 97 F (36.1 C), temperature source Oral, resp. rate 18, height 5\' 5"  (1.651 m), weight 53.5 kg, SpO2 97 %.   PHYSICAL EXAMINATION:  GENERAL:  86 y.o.-year-old patient lying in the bed with no acute distress.  EYES: Pupils equal, round, reactive to light and accommodation. HEENT: Head atraumatic, normocephalic. Oropharynx and nasopharynx clear.  LUNGS: Normal breath sounds bilaterally, no wheezing, rales,rhonchi or crepitation. No use of accessory muscles of respiration.  CARDIOVASCULAR: S1, S2 normal. No murmurs, rubs, or gallops.  ABDOMEN: Soft, non-tender, non-distended. Bowel sounds present. No organomegaly or mass.  EXTREMITIES: No pedal edema, cyanosis, or clubbing.  NEUROLOGIC: Cranial nerves II through XII are intact.  Hearing much better with her hearing aids. PSYCHIATRIC: The patient is alert and oriented x 3.  SKIN: Left first toe small ulceration seen.  Patient also has a small ulceration on the  right first toe.  DATA REVIEW:   CBC Recent Labs  Lab 07/03/19 0344  WBC 9.9  HGB 8.4*  HCT 26.3*  PLT 153    Chemistries  Recent Labs  Lab 07/01/19 0506 07/02/19 0622 07/03/19 0344  NA 137   < > 142  K 4.4   < > 4.6  CL 102   < > 111  CO2 24   < > 22  GLUCOSE 142*   < > 75  BUN 65*   < > 54*  CREATININE 2.15*   < > 1.38*  CALCIUM 9.5   < > 8.6*  AST 30  --   --   ALT 21  --   --   ALKPHOS 71  --   --   BILITOT 0.8  --   --    < > = values in this interval not displayed.     Microbiology Results  Results for orders placed or performed during the hospital encounter of 07/01/19  Respiratory Panel by RT PCR (Flu A&B, Covid) - Nasopharyngeal Swab     Status: None   Collection Time: 07/01/19  5:06 AM   Specimen: Nasopharyngeal Swab  Result Value Ref Range Status   SARS Coronavirus 2 by RT PCR NEGATIVE NEGATIVE Final    Comment: (NOTE) SARS-CoV-2 target nucleic acids are NOT DETECTED. The SARS-CoV-2 RNA is generally detectable in upper respiratoy specimens during the acute phase of infection. The lowest concentration of SARS-CoV-2 viral copies this assay can detect is 131 copies/mL. A negative result does not preclude SARS-Cov-2 infection and should not be used as the sole basis for treatment or other patient management decisions. A negative result may occur with  improper specimen collection/handling, submission of specimen other than nasopharyngeal swab, presence of viral mutation(s) within the areas targeted by this assay, and inadequate number of viral copies (<131 copies/mL). A negative result must be combined with clinical observations, patient history, and epidemiological information. The expected result is Negative. Fact Sheet for Patients:  PinkCheek.be Fact Sheet for Healthcare Providers:  GravelBags.it This test is not yet ap proved or cleared by the [place] FDA and  has been authorized  for detection and/or diagnosis of SARS-CoV-2 by FDA under an Emergency Use Authorization (EUA). This EUA will remain  in effect (meaning this test can be used) for the duration of the COVID-19 declaration under Section 564(b)(1) of the Act, 21 U.S.C. section 360bbb-3(b)(1), unless the authorization is terminated or revoked sooner.    Influenza A by  PCR NEGATIVE NEGATIVE Final   Influenza B by PCR NEGATIVE NEGATIVE Final    Comment: (NOTE) The Xpert Xpress SARS-CoV-2/FLU/RSV assay is intended as an aid in  the diagnosis of influenza from Nasopharyngeal swab specimens and  should not be used as a sole basis for treatment. Nasal washings and  aspirates are unacceptable for Xpert Xpress SARS-CoV-2/FLU/RSV  testing. Fact Sheet for Patients: PinkCheek.be Fact Sheet for Healthcare Providers: GravelBags.it This test is not yet approved or cleared by the [place] FDA and  has been authorized for detection and/or diagnosis of SARS-CoV-2 by  FDA under an Emergency Use Authorization (EUA). This EUA will remain  in effect (meaning this test can be used) for the duration of the  Covid-19 declaration under Section 564(b)(1) of the Act, 21  U.S.C. section 360bbb-3(b)(1), unless the authorization is  terminated or revoked. Performed at [facility], [address] [place], [place] [address]   Blood Culture (routine x 2)     Status: None (Preliminary result)   Collection Time: 07/01/19  5:51 AM   Specimen: BLOOD  Result Value Ref Range Status   Specimen Description BLOOD LEFT FA  Final   Special Requests   Final    BOTTLES DRAWN AEROBIC AND ANAEROBIC Blood Culture results may not be optimal due to an inadequate volume of blood received in culture bottles   Culture   Final    NO GROWTH 2 DAYS Performed at [facility], [address], [place], [place] [address]    Report Status PENDING  Incomplete  Blood Culture  (routine x 2)     Status: None (Preliminary result)   Collection Time: 07/01/19  5:51 AM   Specimen: BLOOD  Result Value Ref Range Status   Specimen Description BLOOD RIGHT [facility]  Final   Special Requests   Final    BOTTLES DRAWN AEROBIC AND ANAEROBIC Blood Culture adequate volume   Culture   Final    NO GROWTH 2 DAYS Performed at [facility], [address] [place], [place] [address]    Report Status PENDING  Incomplete  Urine culture     Status: Abnormal   Collection Time: 07/01/19  5:51 AM   Specimen: In/Out Cath Urine  Result Value Ref Range Status   Specimen Description   Final    IN/OUT CATH URINE Performed at [facility], [address], [place], [place] [address]    Special Requests   Final    NONE Performed at [facility], [address] [place], [place] [address]    Culture (A)  Final    >=100,000 COLONIES/mL ESCHERICHIA COLI 60,000 COLONIES/mL PROTEUS MIRABILIS the E.coli is a Confirmed Extended Spectrum Beta-Lactamase Producer (ESBL).  In bloodstream infections from ESBL organisms, carbapenems are preferred over piperacillin/tazobactam. They are shown to have a lower risk of mortality.    Report Status 07/03/2019 FINAL  Final   Organism ID, Bacteria ESCHERICHIA COLI (A)  Final   Organism ID, Bacteria PROTEUS MIRABILIS (A)  Final      Susceptibility   Escherichia coli - MIC*    AMPICILLIN >=32 RESISTANT Resistant     CEFAZOLIN >=64 RESISTANT Resistant     CEFTRIAXONE >=64 RESISTANT Resistant     CIPROFLOXACIN >=4 RESISTANT Resistant     GENTAMICIN <=1 SENSITIVE Sensitive     IMIPENEM <=0.25 SENSITIVE Sensitive     NITROFURANTOIN <=16 SENSITIVE Sensitive     TRIMETH/SULFA >=320 RESISTANT Resistant     AMPICILLIN/SULBACTAM 16 INTERMEDIATE  Intermediate     PIP/TAZO 8 SENSITIVE Sensitive     * >=100,000 COLONIES/mL ESCHERICHIA COLI   Proteus mirabilis - MIC*    AMPICILLIN <=2 SENSITIVE Sensitive     CEFAZOLIN <=4 SENSITIVE  Sensitive     CEFTRIAXONE <=0.25 SENSITIVE Sensitive     CIPROFLOXACIN >=4 RESISTANT Resistant     GENTAMICIN 8 INTERMEDIATE Intermediate     IMIPENEM 2 SENSITIVE Sensitive     NITROFURANTOIN 128 RESISTANT Resistant     TRIMETH/SULFA >=320 RESISTANT Resistant     AMPICILLIN/SULBACTAM <=2 SENSITIVE Sensitive     PIP/TAZO <=4 SENSITIVE Sensitive     * 60,000 COLONIES/mL PROTEUS MIRABILIS  Urine Culture     Status: Abnormal (Preliminary result)   Collection Time: 07/01/19 11:18 AM   Specimen: PATH Cytology Urine  Result Value Ref Range Status   Specimen Description   Final    URINE, RANDOM Performed at [facility], [address], [place], [place] [address]    Special Requests   Final    NONE Performed at [facility], [address] [place], [place] [address]    Culture (A)  Final    >=100,000 COLONIES/mL ESCHERICHIA COLI SUSCEPTIBILITIES TO FOLLOW Performed at [facility], [address], [place], [place] [address]    Report Status PENDING  Incomplete    RADIOLOGY:  [place] EKG SITE RITE  Result Date: 07/03/2019 If Site Rite image not attached, placement could not be confirmed due to current cardiac rhythm.    Management plans discussed with the patient, family and they are in agreement.  CODE STATUS:     Code Status Orders  (From admission, onward)         Start     Ordered   07/01/19 0725  Do not attempt resuscitation (DNR)  Continuous    Question Answer Comment  In the event of cardiac or respiratory ARREST Do not call a "code blue"   In the event of cardiac or respiratory ARREST Do not perform Intubation, CPR, defibrillation or ACLS   In the event of cardiac or respiratory ARREST Use medication by any route, position, wound care, and other measures to relive pain and suffering. May use oxygen, suction and manual treatment of airway obstruction as needed for comfort.   Comments Full treatment including antibiotics and fluids up until the  point of respiratory or cardiac arrest.  Verified by son/HCPOA, Myrtie Hawk.      07/01/19 0730        Code Status History    Date Active Date Inactive Code Status Order ID Comments User Context   07/01/2019 0553 07/01/2019 0730 DNR [id number]  Hinda Kehr, MD ED   04/23/2019 1040 04/23/2019 1715 Full Code [id number]  Algernon Huxley, MD Inpatient   03/20/2019 1637 03/20/2019 2131 Full Code [id number]  Algernon Huxley, MD Inpatient   02/25/2019 1435 02/28/2019 2224 Full Code [id number]  Flora Lipps, MD Inpatient   01/05/2019 1327 01/05/2019 1854 Full Code [id number]  Algernon Huxley, MD Inpatient   11/24/2018 1313 11/24/2018 1822 Full Code [id number]  Algernon Huxley, MD Inpatient   10/19/2018 0149 10/21/2018 1719 Full Code [id number]  Mayer Camel, NP ED   04/05/2016 1836 04/06/2016 1723 Full Code [id number]  Dustin Flock, MD Inpatient   09/19/2014 2314 09/25/2014 1757 Full Code [id number]  Hower, Aaron Mose, MD ED   Advance Care Planning Activity    Advance Directive Documentation     Most Recent Value  Type  of Advance Directive  Living will, Healthcare Power of Attorney  Pre-existing out of facility DNR order (yellow form or pink MOST form)  --  "MOST" Form in Place?  --      TOTAL TIME TAKING CARE OF THIS PATIENT: 35 minutes.    Loletha Grayer M.D on 07/03/2019 at 4:17 PM  Between 7am to 6pm - Pager - [phone]  After 6pm go to [email or web address] - password [email or web address]  [facility]  CC: Primary care physician; Tracie Harrier, MD

## 2019-07-03 NOTE — Progress Notes (Signed)
Peripherally Inserted Central Catheter Placement  The IV Nurse has discussed with the patient and/or persons authorized to consent for the patient, the purpose of this procedure and the potential benefits and risks involved with this procedure.  The benefits include less needle sticks, lab draws from the catheter, and the patient may be discharged home with the catheter. Risks include, but not limited to, infection, bleeding, blood clot (thrombus formation), and puncture of an artery; nerve damage and irregular heartbeat and possibility to perform a PICC exchange if needed/ordered by physician.  Alternatives to this procedure were also discussed.  Bard Power PICC patient education guide, fact sheet on infection prevention and patient information card has been provided to patient /or left at bedside.    PICC Placement Documentation  PICC Single Lumen 01/75/10 PICC Right Basilic 36 cm 0 cm (Active)  Indication for Insertion or Continuance of Line Home intravenous therapies (PICC only) 07/03/19 1746  Exposed Catheter (cm) 0 cm 07/03/19 1746  Site Assessment Clean;Dry;Intact 07/03/19 1746  Line Status Flushed;Blood return noted 07/03/19 1746  Dressing Type Transparent 07/03/19 1746  Dressing Status Clean;Dry;Intact;Antimicrobial disc in place;Other (Comment) 07/03/19 1746  Dressing Intervention New dressing 07/03/19 2585  Dressing Change Due 07/10/19 07/03/19 1746   Telephone consent signed by Foye Clock Albarece 07/03/2019, 5:46 PM

## 2019-07-04 LAB — URINE CULTURE: Culture: >=100000 — AB

## 2019-07-06 ENCOUNTER — Other Ambulatory Visit: Payer: Self-pay | Admitting: Radiology

## 2019-07-06 LAB — CULTURE, BLOOD (ROUTINE X 2)
Culture: NO GROWTH
Culture: NO GROWTH
Special Requests: ADEQUATE

## 2019-07-07 ENCOUNTER — Telehealth: Payer: Self-pay | Admitting: Radiology

## 2019-07-07 NOTE — Telephone Encounter (Signed)
Ok thanks for update.  Will reassess at f/u in office prior to surgery.  May be more appropriate for hospice depending on clinical status at that time.    Hollice Espy, MD

## 2019-07-07 NOTE — Telephone Encounter (Signed)
Discussed upcoming surgery with Dr Erlene Quan for ureteral stone. Son, Wille Glaser, reports that patient is declining both mentally & physically day by day. She has a severely decreased appetite, is ~90% bedridden, has regurgitation due to a blocked esophagus and is on IV antibiotics. He does not want to cancel surgery at this time but will call back with status update in a few days.

## 2019-07-08 NOTE — Telephone Encounter (Signed)
Son states he does not feel that surgery is appropriate at this time. Patient is now not eating and drinking very little. He will call if status improves.

## 2019-07-10 ENCOUNTER — Emergency Department
Admission: EM | Admit: 2019-07-10 | Discharge: 2019-07-11 | Disposition: A | Discharge status: Home / Self Care | Payer: Medicare Other | Attending: Emergency Medicine | Admitting: Emergency Medicine

## 2019-07-10 ENCOUNTER — Other Ambulatory Visit: Payer: Self-pay

## 2019-07-10 ENCOUNTER — Ambulatory Visit: Payer: Medicare Other | Admitting: Physician Assistant

## 2019-07-10 DIAGNOSIS — N390 Urinary tract infection, site not specified: Secondary | ICD-10-CM | POA: Diagnosis not present

## 2019-07-10 DIAGNOSIS — R531 Weakness: Secondary | ICD-10-CM

## 2019-07-10 DIAGNOSIS — Z20822 Contact with and (suspected) exposure to covid-19: Secondary | ICD-10-CM | POA: Diagnosis not present

## 2019-07-10 DIAGNOSIS — M6281 Muscle weakness (generalized): Secondary | ICD-10-CM | POA: Diagnosis not present

## 2019-07-10 DIAGNOSIS — I129 Hypertensive chronic kidney disease with stage 1 through stage 4 chronic kidney disease, or unspecified chronic kidney disease: Secondary | ICD-10-CM | POA: Diagnosis not present

## 2019-07-10 DIAGNOSIS — R630 Anorexia: Secondary | ICD-10-CM | POA: Diagnosis present

## 2019-07-10 DIAGNOSIS — B9629 Other Escherichia coli [E. coli] as the cause of diseases classified elsewhere: Secondary | ICD-10-CM | POA: Diagnosis not present

## 2019-07-10 DIAGNOSIS — Z7982 Long term (current) use of aspirin: Secondary | ICD-10-CM | POA: Insufficient documentation

## 2019-07-10 DIAGNOSIS — Z79899 Other long term (current) drug therapy: Secondary | ICD-10-CM | POA: Diagnosis not present

## 2019-07-10 DIAGNOSIS — N183 Chronic kidney disease, stage 3 unspecified: Secondary | ICD-10-CM | POA: Diagnosis not present

## 2019-07-10 LAB — COMPREHENSIVE METABOLIC PANEL
ALT: 17 U/L (ref 0–44)
AST: 24 U/L (ref 15–41)
Albumin: 2.5 g/dL — ABNORMAL LOW (ref 3.5–5.0)
Alkaline Phosphatase: 56 U/L (ref 38–126)
Anion gap: 7 (ref 5–15)
BUN: 24 mg/dL — ABNORMAL HIGH (ref 8–23)
CO2: 26 mmol/L (ref 22–32)
Calcium: 8.3 mg/dL — ABNORMAL LOW (ref 8.9–10.3)
Chloride: 108 mmol/L (ref 98–111)
Creatinine, Ser: 0.84 mg/dL (ref 0.44–1.00)
GFR calc Af Amer: >60 mL/min (ref >60)
GFR calc non Af Amer: 57 mL/min — ABNORMAL LOW (ref >60)
Glucose, Bld: 73 mg/dL (ref 70–99)
Potassium: 3.8 mmol/L (ref 3.5–5.1)
Sodium: 141 mmol/L (ref 135–145)
Total Bilirubin: 0.7 mg/dL (ref 0.3–1.2)
Total Protein: 5.7 g/dL — ABNORMAL LOW (ref 6.5–8.1)

## 2019-07-10 LAB — CBC WITH DIFFERENTIAL/PLATELET
Abs Immature Granulocytes: 0.04 K/uL (ref 0.00–0.07)
Basophils Absolute: 0 K/uL (ref 0.0–0.1)
Basophils Relative: 0 %
Eosinophils Absolute: 0.1 K/uL (ref 0.0–0.5)
Eosinophils Relative: 2 %
HCT: 27.6 % — ABNORMAL LOW (ref 36.0–46.0)
Hemoglobin: 8.9 g/dL — ABNORMAL LOW (ref 12.0–15.0)
Immature Granulocytes: 1 %
Lymphocytes Relative: 11 %
Lymphs Abs: 0.5 K/uL — ABNORMAL LOW (ref 0.7–4.0)
MCH: 30.3 pg (ref 26.0–34.0)
MCHC: 32.2 g/dL (ref 30.0–36.0)
MCV: 93.9 fL (ref 80.0–100.0)
Monocytes Absolute: 0.5 K/uL (ref 0.1–1.0)
Monocytes Relative: 11 %
Neutro Abs: 3.5 K/uL (ref 1.7–7.7)
Neutrophils Relative %: 75 %
Platelets: 163 K/uL (ref 150–400)
RBC: 2.94 MIL/uL — ABNORMAL LOW (ref 3.87–5.11)
RDW: 17.3 % — ABNORMAL HIGH (ref 11.5–15.5)
WBC: 4.6 K/uL (ref 4.0–10.5)
nRBC: 0 % (ref 0.0–0.2)

## 2019-07-10 LAB — MAGNESIUM: Magnesium: 1.7 mg/dL (ref 1.7–2.4)

## 2019-07-10 MED ORDER — ERTAPENEM IV (FOR PTA / DISCHARGE USE ONLY): 500.0000 mg | INTRAVENOUS | Status: DC

## 2019-07-10 MED ORDER — CALCITRIOL 0.25 MCG PO CAPS
0.2500 ug | ORAL_CAPSULE | Freq: Every day | ORAL | Status: DC
  Administered 2019-07-10 – 2019-07-11 (×2): 0.25 ug via ORAL
  Filled 2019-07-10 (×3): qty 1

## 2019-07-10 MED ORDER — LACTATED RINGERS IV BOLUS
1000.0000 mL | Freq: Once | INTRAVENOUS | Status: AC
  Administered 2019-07-10: 1000 mL via INTRAVENOUS

## 2019-07-10 MED ORDER — SODIUM CHLORIDE 0.9 % IV SOLN
1.0000 g | Freq: Two times a day (BID) | INTRAVENOUS | Status: DC
  Administered 2019-07-10 – 2019-07-11 (×3): 1 g via INTRAVENOUS
  Filled 2019-07-10 (×5): qty 1

## 2019-07-10 MED ORDER — ASPIRIN 81 MG PO CHEW
81.0000 mg | CHEWABLE_TABLET | Freq: Every day | ORAL | Status: DC
  Administered 2019-07-10 – 2019-07-11 (×2): 81 mg via ORAL
  Filled 2019-07-10 (×2): qty 1

## 2019-07-10 NOTE — ED Triage Notes (Signed)
Pt arrives from home via [facility]. Per pt family, pt has not been eating as much over the past several days. Pt states "I just haven't felt like eating." Pt has no complaints, A&O x4, NAD

## 2019-07-10 NOTE — ED Notes (Addendum)
Pt with soiled brief. This RN and Arby Barrette, RN cleaned pt. Clean brief and dry sheet placed. Pressure injury noted on pt's sacrum. Purewick placed on pt.

## 2019-07-10 NOTE — ED Notes (Signed)
Pt given [place] sandwich tray. Pt ate 1 slice of rolled up [place] and several potato chips. Pt stated that was enough and she was full. Pt resting comfortably.

## 2019-07-10 NOTE — ED Provider Notes (Signed)
[facility] Emergency Department Provider Note   ____________________________________________   First MD Initiated Contact with Patient 07/10/19 [phone]     (approximate)  I have reviewed the triage vital signs and the nursing notes.   HISTORY  Chief Complaint Anorexia    HPI Sandy Roberson is a 86 y.o. female with past medical history of CAD, hypertension, hyperlipidemia, CKD, and PAD who presents to the ED for decreased appetite.  Patient was recently admitted for sepsis and ESBL E. coli UTI associated with kidney stone.  She has been receiving daily ertapenem via PICC line and previously had her ureteral stent removed.  Patient son states that she has been gradually declining since then with almost no appetite over the past couple of days.  She had a couple sips of water yesterday as well as 2 eggs, but has otherwise not wanted to eat or drink anything.  He has noted her urine output is decreased, but she has not complained of any abdominal pain, nausea, or vomiting.  Patient states "I just have not felt like eating".  She has been receiving her ertapenem via PICC line as prescribed.        Past Medical History:  Diagnosis Date  . Coronary artery disease   . Hyperlipidemia   . Hypertension   . Renal disorder     Patient Active Problem List   Diagnosis Date Noted  . Obstructive nephropathy   . Pressure injury of skin 07/02/2019  . Lactic acidosis   . Weakness   . Ulcer of both feet ([place])   . Sepsis ([place]) 07/01/2019  . AKI (acute kidney injury) ([place]) 07/01/2019  . Fall at home, initial encounter 07/01/2019  . Hyperthyroidism 07/01/2019  . Anemia of chronic disease 07/01/2019  . Foot infection 02/25/2019  . Cellulitis 10/29/2018  . Food impaction of esophagus   . Stricture and stenosis of esophagus   . Esophageal obstruction due to food impaction 10/19/2018  . Atherosclerosis of native arteries of the extremities with ulceration ([place])  03/28/2018  . Chronic kidney disease 03/11/2018  . CKD (chronic kidney disease) stage 3, GFR 30-59 ml/min 03/11/2018  . Coronary artery disease 03/11/2018  . Hyperlipidemia 03/11/2018  . Hypertension 03/11/2018  . Thyroid disease 03/11/2018  . SOB (shortness of breath) 04/05/2016  . Flu vaccine need 02/17/2016  . H/O dehydration 08/12/2015  . ITP (idiopathic thrombocytopenic purpura) 09/25/2014  . ARF (acute renal failure) ([place]) 09/25/2014  . Hypokalemia 09/25/2014  . Acute diastolic CHF (congestive heart failure) ([place]) 09/25/2014  . Sepsis due to urinary tract infection ([place]) 09/19/2014  . Hyponatremia 09/19/2014  . Kidney stone 11/12/2012  . Urge incontinence 11/12/2012  . Urinary tract infection, site not specified 11/12/2012    Past Surgical History:  Procedure Laterality Date  . CORONARY STENT PLACEMENT    . CYSTOSCOPY W/ URETERAL STENT PLACEMENT Right 07/01/2019   Procedure: CYSTOSCOPY WITH RETROGRADE PYELOGRAM/URETERAL STENT PLACEMENT;  Surgeon: Hollice Espy, MD;  Location: [facility] ORS;  Service: Urology;  Laterality: Right;  . ESOPHAGOGASTRODUODENOSCOPY (EGD) WITH PROPOFOL N/A 10/20/2018   Procedure: ESOPHAGOGASTRODUODENOSCOPY (EGD) WITH PROPOFOL;  Surgeon: Lucilla Lame, MD;  Location: [facility] ENDOSCOPY;  Service: Endoscopy;  Laterality: N/A;  . FRACTURE SURGERY    . LOWER EXTREMITY ANGIOGRAPHY Right 11/24/2018   Procedure: LOWER EXTREMITY ANGIOGRAPHY;  Surgeon: Algernon Huxley, MD;  Location: [place] [facility];  Service: Cardiovascular;  Laterality: Right;  . LOWER EXTREMITY ANGIOGRAPHY Right 01/05/2019   Procedure: LOWER EXTREMITY ANGIOGRAPHY;  Surgeon: Lucky Cowboy,  Erskine Squibb, MD;  Location: [place] [facility];  Service: Cardiovascular;  Laterality: Right;  . LOWER EXTREMITY ANGIOGRAPHY Left 03/20/2019   Procedure: LOWER EXTREMITY ANGIOGRAPHY;  Surgeon: Algernon Huxley, MD;  Location: [place] [facility];  Service: Cardiovascular;  Laterality: Left;  . LOWER EXTREMITY ANGIOGRAPHY Left  04/23/2019   Procedure: LOWER EXTREMITY ANGIOGRAPHY;  Surgeon: Algernon Huxley, MD;  Location: [place] [facility];  Service: Cardiovascular;  Laterality: Left;  . STENT PLACEMENT RT URETER [facility] HX)     since been removed   . TRANSMETATARSAL AMPUTATION Right 02/26/2019   Procedure: 1st METATARSAL RESECTION;  Surgeon: Albertine Patricia, DPM;  Location: [facility] ORS;  Service: Podiatry;  Laterality: Right;    Prior to Admission medications   Medication Sig Start Date End Date Taking? Authorizing Provider  aspirin (ASPIRIN 81) 81 MG chewable tablet Chew 1 tablet (81 mg total) by mouth daily. Patient not taking: Reported on 07/10/2019 02/28/19   Kayleen Memos, DO  calcitRIOL (ROCALTROL) 0.25 MCG capsule Take 0.25 mcg by mouth daily.  03/02/19   [provider]  ertapenem (INVANZ) IVPB Inject 500 mg into the vein daily for 12 days. Indication:  ESBL E coli UTI Last Day of Therapy:  07/16/2019 Labs - Once weekly:  CBC/D and CMP, Fax labs to PCP at [phone] 07/04/19 07/16/19  Loletha Grayer, MD  sodium chloride flush 0.9 % SOLN injection 45ml flush before and after antibiotic 07/03/19   Loletha Grayer, MD    Allergies Penicillins  Family History  Problem Relation Age of Onset  . Heart failure Neg Hx     Social History Social History   Tobacco Use  . Smoking status: Never Smoker  . Smokeless tobacco: Never Used  Substance Use Topics  . Alcohol use: No  . Drug use: Never    Review of Systems  Constitutional: No fever/chills.  Positive for decreased appetite. Eyes: No visual changes. ENT: No sore throat. Cardiovascular: Denies chest pain. Respiratory: Denies shortness of breath. Gastrointestinal: No abdominal pain.  No nausea, no vomiting.  No diarrhea.  No constipation. Genitourinary: Negative for dysuria. Musculoskeletal: Negative for back pain. Skin: Negative for rash. Neurological: Negative for headaches, focal weakness or  numbness.  ____________________________________________   PHYSICAL EXAM:  VITAL SIGNS: ED Triage Vitals  Enc Vitals Group     BP 07/10/19 0651 129/76     Pulse Rate 07/10/19 0651 93     Resp 07/10/19 0651 18     Temp 07/10/19 0651 97.6 F (36.4 C)     Temp Source 07/10/19 0651 Oral     SpO2 07/10/19 0651 96 %     Weight 07/10/19 0655 117 lb 15.1 oz (53.5 kg)     Height 07/10/19 0655 5\' 5"  (1.651 m)     Head Circumference --      Peak Flow --      Pain Score 07/10/19 0655 0     Pain Loc --      Pain Edu? --      Excl. in [place]? --     Constitutional: Alert and oriented. Eyes: Conjunctivae are normal. Head: Atraumatic. Nose: No congestion/rhinnorhea. Mouth/Throat: Mucous membranes are dry. Neck: Normal ROM Cardiovascular: Normal rate, regular rhythm. Grossly normal heart sounds.  1+ DP pulses bilaterally.  Right upper extremity PICC line in place with no associated erythema or warmth. Respiratory: Normal respiratory effort.  No retractions. Lungs CTAB. Gastrointestinal: Soft and nontender. No distention. Genitourinary: deferred Musculoskeletal: No lower extremity tenderness nor  edema. Neurologic:  Normal speech and language. No gross focal neurologic deficits are appreciated. Skin: Wounds noted bilaterally to metatarsal phalangeal joints that are in process of healing. Psychiatric: Mood and affect are normal. Speech and behavior are normal.  ____________________________________________   LABS (all labs ordered are listed, but only abnormal results are displayed)  Labs Reviewed  COMPREHENSIVE METABOLIC PANEL - Abnormal; Notable for the following components:      Result Value   BUN 24 (*)    Calcium 8.3 (*)    Total Protein 5.7 (*)    Albumin 2.5 (*)    GFR calc non Af Amer 57 (*)    All other components within normal limits  CBC WITH DIFFERENTIAL/PLATELET - Abnormal; Notable for the following components:   RBC 2.94 (*)    Hemoglobin 8.9 (*)    HCT 27.6 (*)    RDW  17.3 (*)    Lymphs Abs 0.5 (*)    All other components within normal limits  MAGNESIUM  URINALYSIS, COMPLETE (UACMP) WITH MICROSCOPIC   ____________________________________________  EKG  ED ECG REPORT I, Blake Divine, the attending physician, personally viewed and interpreted this ECG.   Date: 07/10/2019  EKG Time: 8:44  Rate: 88  Rhythm: normal sinus rhythm  Axis: Normal  Intervals:none  ST&T Change: None   PROCEDURES  Procedure(s) performed (including Critical Care):  Procedures   ____________________________________________   INITIAL IMPRESSION / ASSESSMENT AND PLAN / ED COURSE       86 year old female with recent admission for ESBL E. coli UTI and associated kidney stone presents to the ED for inability to ambulate and poor appetite over the past couple of days.  Son states that he has been unable to care for her at home due to her increased weakness and inability to walk.  He is also concerned that she has become dehydrated.  We will check electrolytes and hydrate with IV fluids.  Son states he is not quite ready to explore hospice care as he would like to see how she responds to rehab first.  Lab work is unremarkable, doubt worsening UTI or sepsis and there is no evidence of electrolyte abnormality or significant dehydration.  Patient evaluated by social work and physical therapy, also visited by hospice liaison.  At this point, son would like more time to discuss further options with social work.  Patient to receive meropenem here in the ED rather than ertapenem per pharmacy.      ____________________________________________   FINAL CLINICAL IMPRESSION(S) / ED DIAGNOSES  Final diagnoses:  Generalized weakness  UTI due to extended-spectrum beta lactamase (ESBL) producing Escherichia coli     ED Discharge Orders    None       Note:  This document was prepared using Dragon voice recognition software and may include unintentional dictation errors.    Blake Divine, MD 07/10/19 1504

## 2019-07-10 NOTE — TOC Initial Note (Signed)
Transition of Care [facility]) - Initial/Assessment Note    Patient Details  Name: Sandy Roberson MRN: [id number] Date of Birth: Oct 11, 1915  Transition of Care [facility]) CM/SW Contact:    Anselm Pancoast, RN Phone Number: 07/10/2019, 10:22 AM  Clinical Narrative:                 Spoke with son who is concerned about mother and her recovery. Discussed goals of care and what they were for the family/patient in regards to SNF placement compared to Hospice services. Discussed difference in Hospice residential and home hospice. RN CM discussed 3 night inpatient stay for SNF and patient not meeting criteria at this time. RN CM outreached to Santiago Glad with [facility] for possible inpatient Hospice. Santiago Glad states she will go and talk with patient and son today. RN CM updated son on Santiago Glad coming to talk later today.   Expected Discharge Plan: [place]     Patient Goals and CMS Choice Patient states their goals for this hospitalization and ongoing recovery are:: Unsure of goals at this time-Hospice vs Rehab      Expected Discharge Plan and Services Expected Discharge Plan: [place] In-house Referral: Clinical Social Work Discharge Planning Services: CM Consult   Living arrangements for the past 2 months: [place]                                      Prior Living Arrangements/Services Living arrangements for the past 2 months: Single Family Home Lives with:: Adult Children Patient language and need for interpreter reviewed:: Yes Do you feel safe going back to the place where you live?: No   Concerned about ability to care for family at home  Need for Family Participation in Patient Care: Yes (Comment) Care giver support system in place?: Yes (comment) Current home services: DME(walker, cane) Criminal Activity/Legal Involvement Pertinent to Current Situation/Hospitalization: No - Comment as needed  Activities of Daily Living      Permission  Sought/Granted Permission sought to share information with : Case Manager, Customer service manager Permission granted to share information with : Yes, Verbal Permission Granted  Share Information with NAME: Santiago Glad  Permission granted to share info w AGENCY: [facility]        Emotional Assessment Appearance:: Appears stated age Attitude/Demeanor/Rapport: Engaged Affect (typically observed): Accepting Orientation: : Oriented to Self, Oriented to Place, Fluctuating Orientation (Suspected and/or reported Sundowners), Oriented to  Time, Oriented to Situation Alcohol / Substance Use: Never Used Psych Involvement: No (comment)  Admission diagnosis:  Not eating for a week. Patient Active Problem List   Diagnosis Date Noted  . Obstructive nephropathy   . Pressure injury of skin 07/02/2019  . Lactic acidosis   . Weakness   . Ulcer of both feet ([place])   . Sepsis ([place]) 07/01/2019  . AKI (acute kidney injury) ([place]) 07/01/2019  . Fall at home, initial encounter 07/01/2019  . Hyperthyroidism 07/01/2019  . Anemia of chronic disease 07/01/2019  . Foot infection 02/25/2019  . Cellulitis 10/29/2018  . Food impaction of esophagus   . Stricture and stenosis of esophagus   . Esophageal obstruction due to food impaction 10/19/2018  . Atherosclerosis of native arteries of the extremities with ulceration ([place]) 03/28/2018  . Chronic kidney disease 03/11/2018  . CKD (chronic kidney disease) stage 3, GFR 30-59 ml/min 03/11/2018  . Coronary artery disease 03/11/2018  . Hyperlipidemia 03/11/2018  .  Hypertension 03/11/2018  . Thyroid disease 03/11/2018  . SOB (shortness of breath) 04/05/2016  . Flu vaccine need 02/17/2016  . H/O dehydration 08/12/2015  . ITP (idiopathic thrombocytopenic purpura) 09/25/2014  . ARF (acute renal failure) ([place]) 09/25/2014  . Hypokalemia 09/25/2014  . Acute diastolic CHF (congestive heart failure) ([place]) 09/25/2014  . Sepsis due to urinary tract infection ([place])  09/19/2014  . Hyponatremia 09/19/2014  . Kidney stone 11/12/2012  . Urge incontinence 11/12/2012  . Urinary tract infection, site not specified 11/12/2012   PCP:  Tracie Harrier, MD Pharmacy:   [id number] - [place], [place] - [address] [place] [address] Phone: [phone] Fax: [phone]     Social Determinants of Health (SDOH) Interventions    Readmission Risk Interventions Readmission Risk Prevention Plan 07/03/2019 07/02/2019 02/27/2019  Transportation Screening Complete Complete Complete  PCP or Specialist Appt within 5-7 Days - - Complete  PCP or Specialist Appt within 3-5 Days Complete - -  Home Care Screening - - Complete  Medication Review (RN CM) - - Complete  HRI or Home Care Consult Complete Complete -  Palliative Care Screening Not Applicable - -  Medication Review (RN Care Manager) Complete Complete -  Some recent data might be hidden

## 2019-07-10 NOTE — Progress Notes (Signed)
PT Cancellation Note  Patient Details Name: Sandy Roberson MRN: [id number] DOB: [birth year]   Cancelled Treatment:    Reason Eval/Treat Not Completed: Other (comment). PT consult received, chart reviewed. Per chart pt is awaiting conversation with hospice. PT to follow up when POC is established.    Lieutenant Diego PT, DPT 12:52 PM,07/10/19

## 2019-07-10 NOTE — Progress Notes (Signed)
Notified  By [facility] Sandy Roberson of families interest in hospice home services. Writer spoke at length with patient's son Sandy Roberson at bedside, services explained. Questions answered, many different scenarios discussed. Sandy Roberson is very uncertain about what he wants for his mother. He would like to speak with his girlfriend and his mother.  Patient was alert and interactive with Probation officer. She is hard of hearing, but responds to questions. Much emotional support given. Patient would benefit from community Palliative to continue discussions/goals. [facility] notified of visit outcome.  Thank you. Flo Shanks BSN, RN, [place] [phone]

## 2019-07-10 NOTE — ED Notes (Signed)
Pt's hearing aids placed in charging case at bedside.

## 2019-07-10 NOTE — ED Notes (Signed)
Social work at bedside.  

## 2019-07-10 NOTE — TOC Progression Note (Signed)
Transition of Care [facility]) - Progression Note    Patient Details  Name: Sandy Roberson MRN: [id number] Date of Birth: 31-Dec-1915  Transition of [facility]) CM/SW Contact  Anselm Pancoast, RN Phone Number: 07/10/2019, 3:07 PM  Clinical Narrative:    Patient not determined appropriate for Hospice House. Possible transfer to Home with Hospice however son wants to talk with family prior to making decisions.    Expected Discharge Plan: [place]    Expected Discharge Plan and Services Expected Discharge Plan: [place] In-house Referral: Clinical Social Work Discharge Planning Services: CM Consult   Living arrangements for the past 2 months: [place] Determinants of Health (SDOH) Interventions    Readmission Risk Interventions Readmission Risk Prevention Plan 07/03/2019 07/02/2019 02/27/2019  Transportation Screening Complete Complete Complete  PCP or Specialist Appt within 5-7 Days - - Complete  PCP or Specialist Appt within 3-5 Days Complete - -  Home Care Screening - - Complete  Medication Review (RN CM) - - Complete  HRI or Home Care Consult Complete Complete -  Palliative Care Screening Not Applicable - -  Medication Review (RN Care Manager) Complete Complete -  Some recent data might be hidden

## 2019-07-11 DIAGNOSIS — M6281 Muscle weakness (generalized): Secondary | ICD-10-CM | POA: Diagnosis not present

## 2019-07-11 LAB — URINALYSIS, COMPLETE (UACMP) WITH MICROSCOPIC
Bilirubin Urine: NEGATIVE
Glucose, UA: 50 mg/dL — AB
Ketones, ur: 5 mg/dL — AB
Nitrite: NEGATIVE
Protein, ur: 100 mg/dL — AB
Specific Gravity, Urine: 1.019 (ref 1.005–1.030)
pH: 5 (ref 5.0–8.0)

## 2019-07-11 LAB — RESPIRATORY PANEL BY RT PCR (FLU A&B, COVID)
Influenza A by PCR: NEGATIVE
Influenza B by PCR: NEGATIVE
SARS Coronavirus 2 by RT PCR: NEGATIVE

## 2019-07-11 MED ORDER — LORAZEPAM 2 MG/ML IJ SOLN: 1.0000 mg | Freq: Once | INTRAMUSCULAR | Status: DC

## 2019-07-11 NOTE — ED Notes (Signed)
Called [facility] for transport to [place]

## 2019-07-11 NOTE — TOC Progression Note (Signed)
Transition of Care [facility]) - Progression Note    Patient Details  Name: Sandy Roberson MRN: [id number] Date of Birth: [birth year]  Transition of Care [facility]) CM/SW [place], LCSW Phone Number: 07/11/2019, 12:09 PM  Clinical Narrative:   Patient does not meet criteria for hospice facility.  Patient does meet criteria for home with hospice.   SW will process referral with [place]   [id number]: Place call w/[facility] waiting for call back.   Expected Discharge Plan: [place]    Expected Discharge Plan and Services Expected Discharge Plan: [place] In-house Referral: Clinical Social Work Discharge Planning Services: CM Consult   Living arrangements for the past 2 months: [place] Determinants of Health (SDOH) Interventions    Readmission Risk Interventions Readmission Risk Prevention Plan 07/03/2019 07/02/2019 02/27/2019  Transportation Screening Complete Complete Complete  PCP or Specialist Appt within 5-7 Days - - Complete  PCP or Specialist Appt within 3-5 Days Complete - -  Home Care Screening - - Complete  Medication Review (RN CM) - - Complete  HRI or Home Care Consult Complete Complete -  Palliative Care Screening Not Applicable - -  Medication Review (RN Care Manager) Complete Complete -  Some recent data might be hidden

## 2019-07-11 NOTE — ED Notes (Signed)
Pt unable to sign for d/c. Pt's son aware of d/c instructions and have no further questions at this time.

## 2019-07-11 NOTE — ED Notes (Signed)
Educated son on how to properly change brief and how to properly roll pt.

## 2019-07-11 NOTE — ED Notes (Signed)
SW at bedside.

## 2019-07-11 NOTE — Progress Notes (Signed)
PT Cancellation Note  Patient Details Name: Sandy Roberson MRN: [id number] DOB: [birth year]   Cancelled Treatment:    Reason Eval/Treat Not Completed: (Noted plans for discharge home with home hospice services.  EMS called for return home.  No additional PT needs identified at this time; will complete order.  Please re-consult should needs change.)   Jozelyn Kuwahara H. Owens Shark, PT, DPT, NCS 07/11/19, 1:55 PM [phone]

## 2019-07-11 NOTE — ED Provider Notes (Signed)
-----------------------------------------   12:10 PM on 07/11/2019 -----------------------------------------  Patient son has returned to the ED and with further discussion with social work, has opted for discharge home with home hospice.  Orders placed for ambulatory referral to hospice.  Patient remained stable and has received antibiotics for ESBL UTI while here in the ED.  She is appropriate for discharge home with home hospice.   Blake Divine, MD 07/11/19 [phone]

## 2019-07-11 NOTE — ED Notes (Signed)
Spoke with on call SW to speak with pt's son. Pt son at bedside and ready to discuss pt's future plan of care.

## 2019-07-24 ENCOUNTER — Ambulatory Visit: Payer: Self-pay | Admitting: Physician Assistant

## 2019-08-03 ENCOUNTER — Ambulatory Visit: Admit: 2019-08-03 | Payer: Medicare Other | Admitting: Urology

## 2019-08-03 SURGERY — CYSTOSCOPY/URETEROSCOPY/HOLMIUM LASER/STENT PLACEMENT
Anesthesia: General | Laterality: Right

## 2019-08-11 ENCOUNTER — Other Ambulatory Visit (INDEPENDENT_AMBULATORY_CARE_PROVIDER_SITE_OTHER): Payer: Self-pay | Admitting: Nurse Practitioner

## 2019-08-11 DIAGNOSIS — I7025 Atherosclerosis of native arteries of other extremities with ulceration: Secondary | ICD-10-CM

## 2019-08-15 DEATH — deceased

## 2019-08-20 ENCOUNTER — Ambulatory Visit (INDEPENDENT_AMBULATORY_CARE_PROVIDER_SITE_OTHER): Payer: Medicare Other | Admitting: Nurse Practitioner

## 2019-08-20 ENCOUNTER — Encounter (INDEPENDENT_AMBULATORY_CARE_PROVIDER_SITE_OTHER): Payer: PRIVATE HEALTH INSURANCE

## 2021-08-12 IMAGING — CT CT CHEST W/O CM
2 of 4 series · 13 of 36 positions shown, 16 images · non-contrast
Comparison: None.

CLINICAL DATA: Flank pain with kidney stone suspected.  Hip trauma

EXAM:
CT CHEST, ABDOMEN AND PELVIS WITHOUT CONTRAST
TECHNIQUE: Multidetector CT imaging of the chest, abdomen and pelvis was
performed following the standard protocol without IV contrast.

[Series 2: cap wo st · axial · 0.67mm/px · z∈[-1060,-570]mm · 10 of 118 slices shown, 13 images]
[im 10/118  mediastinal]
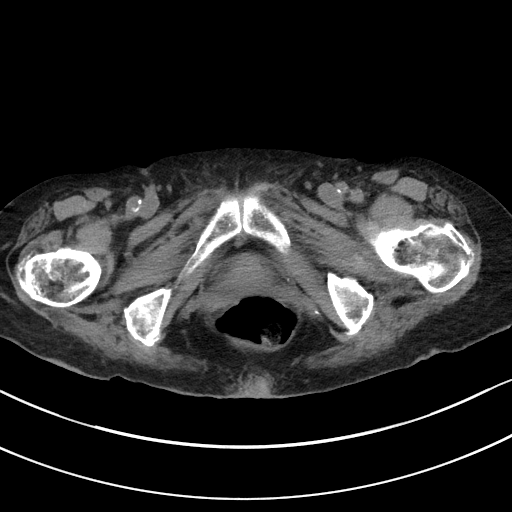
[im 10/118  lung]
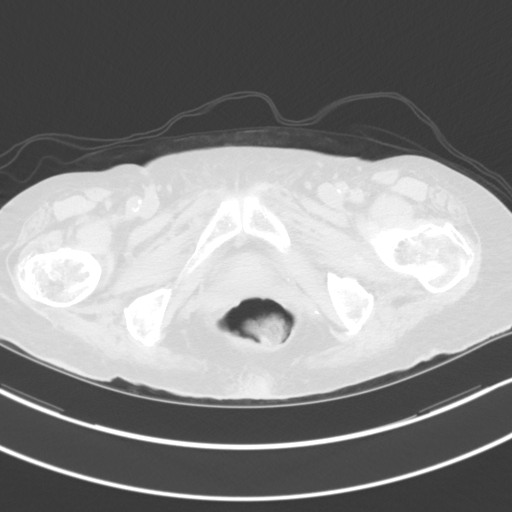
[im 20/118  lung]
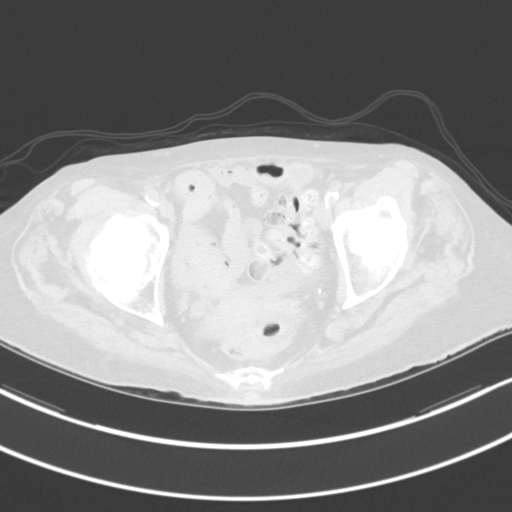
[im 30/118  lung]
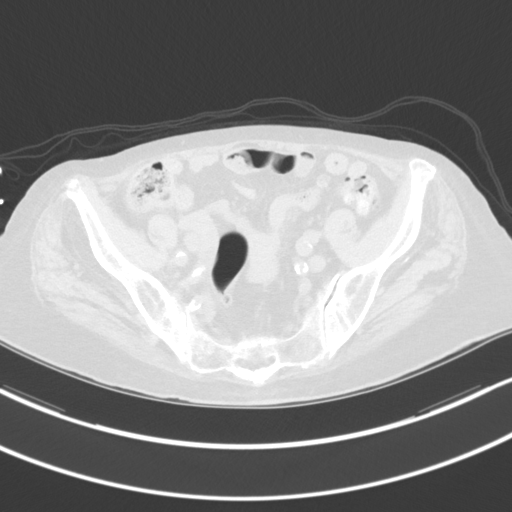
[im 40/118  lung]
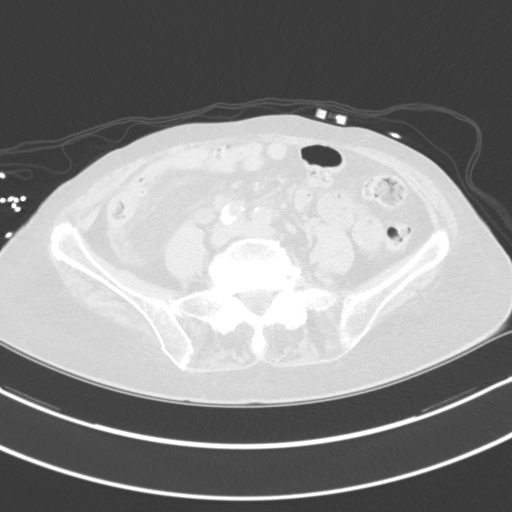
[im 49/118  mediastinal]
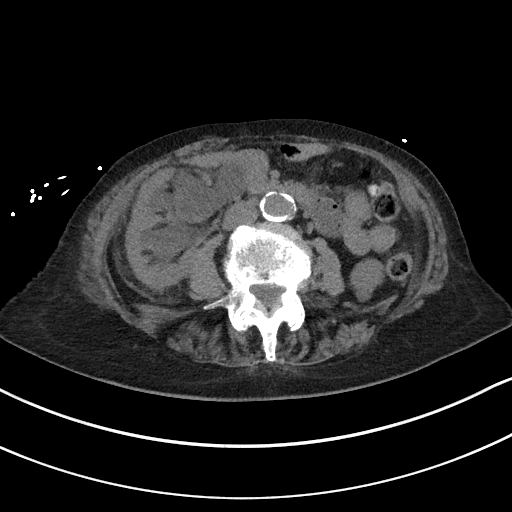
[im 49/118  lung]
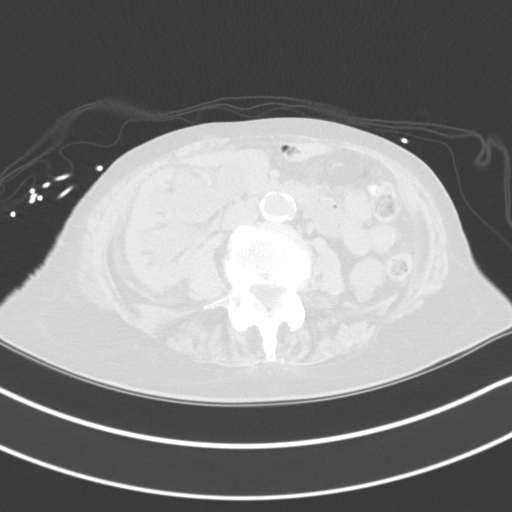
[im 69/118  lung]
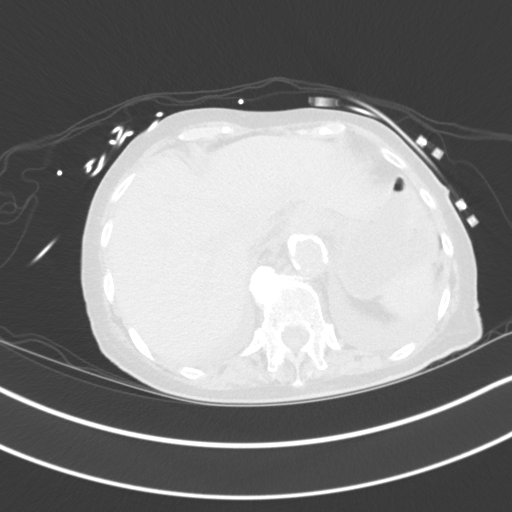
[im 79/118  lung]
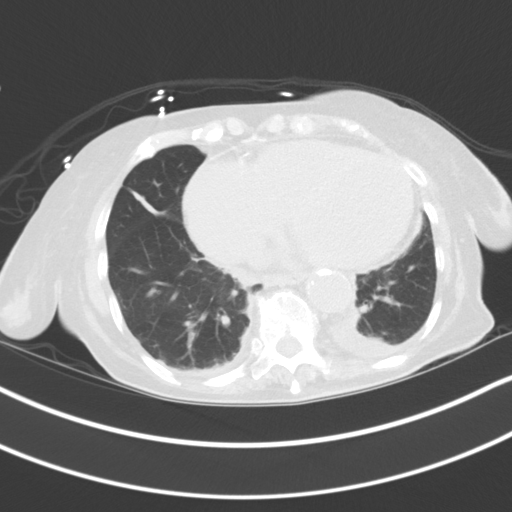
[im 88/118  lung]
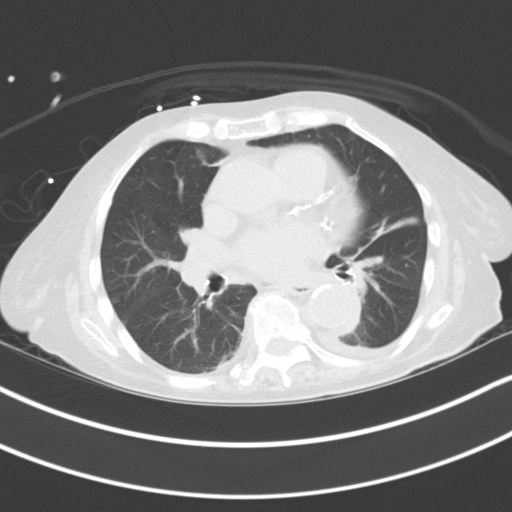
[im 98/118  mediastinal]
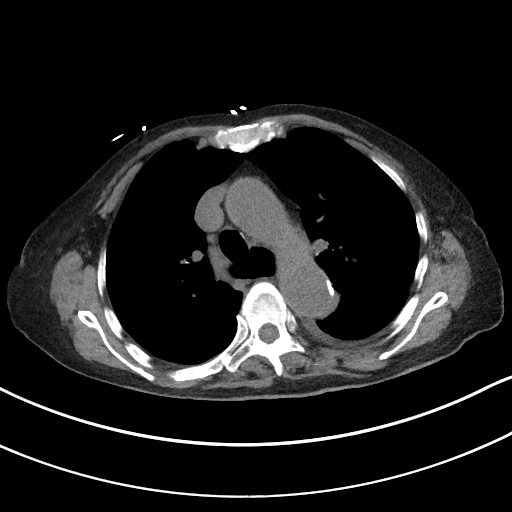
[im 98/118  lung]
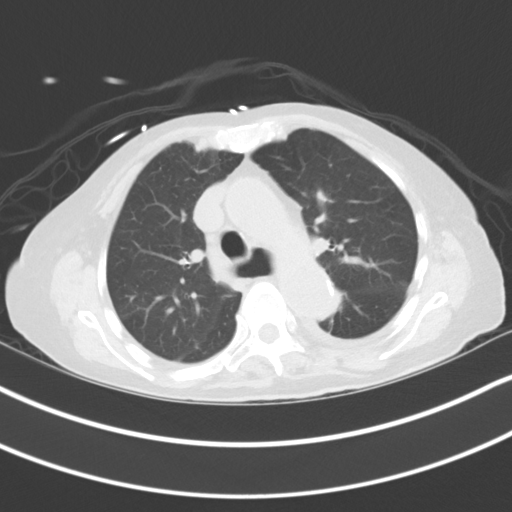
[im 108/118  lung]
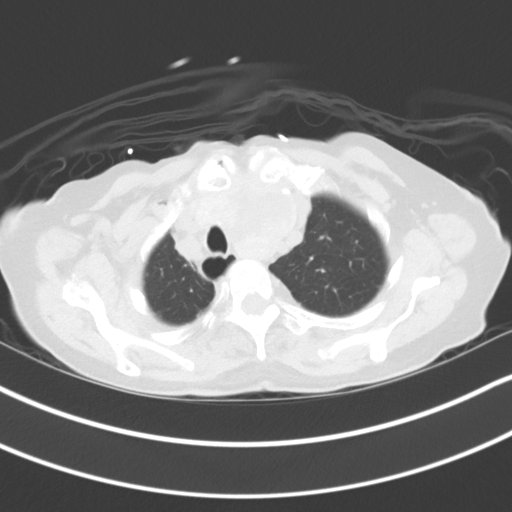

[Series 6: coronal · coronal · 0.64mm/px · 3 of 113 slices shown]
[im 23/113  lung]
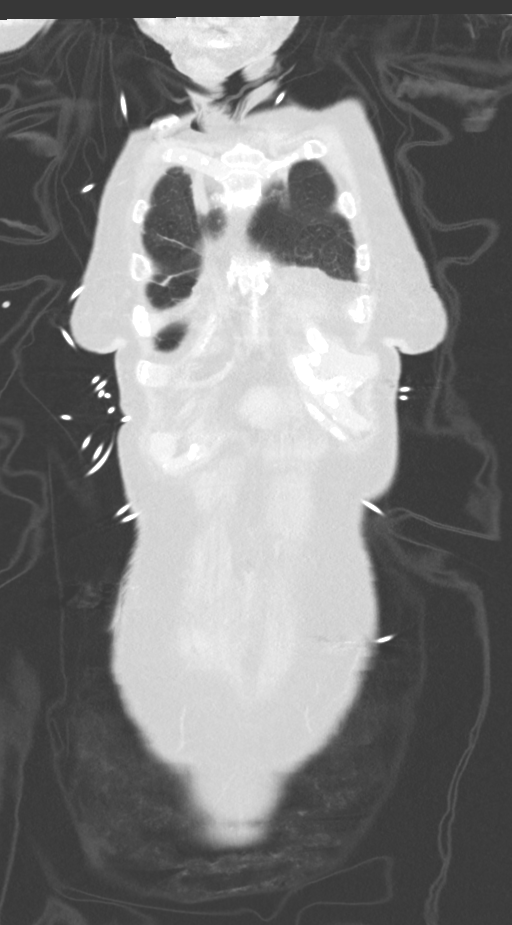
[im 45/113  lung]
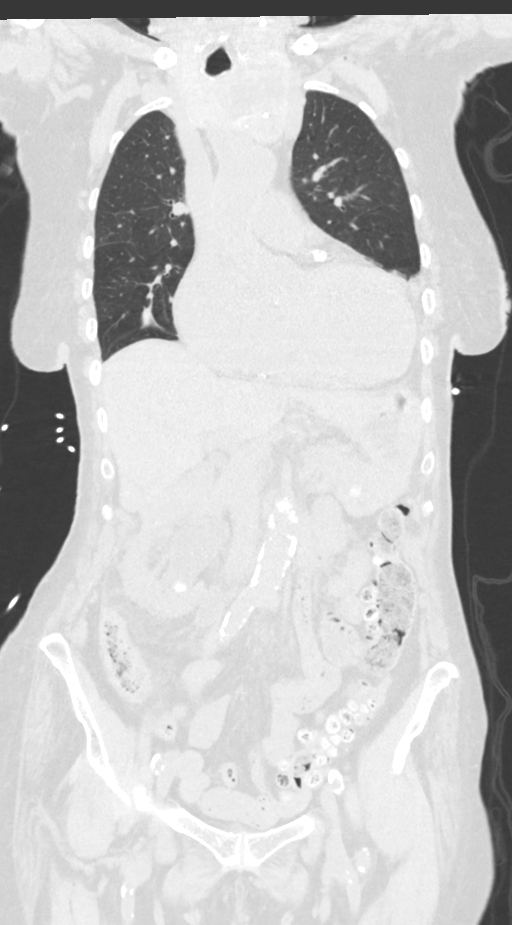
[im 68/113  lung]
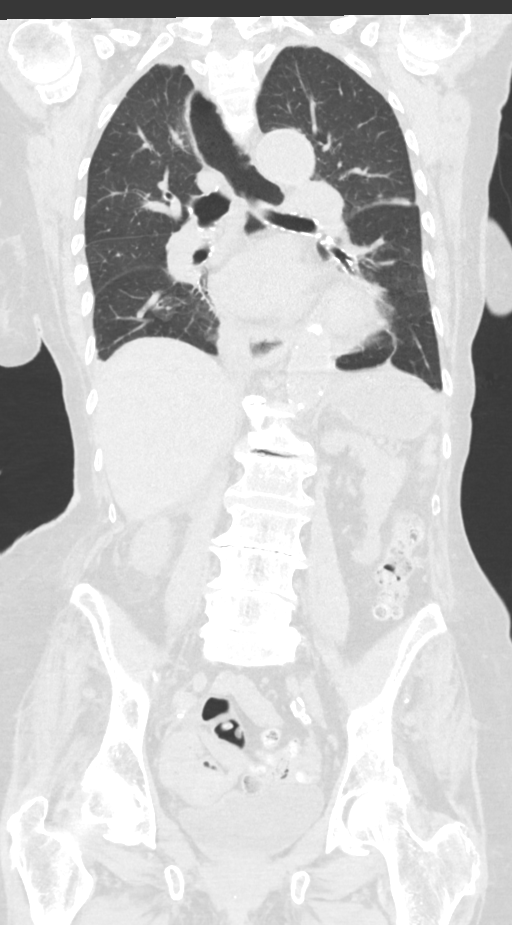

[13 of 36 positions shown; findings below may reference images not displayed]

FINDINGS: CT CHEST FINDINGS

Cardiovascular: Cardiomegaly. No pericardial effusion. Extensive
aortic and coronary atherosclerosis.

Mediastinum/Nodes: Nodular goiter with large left lobe nodule
measuring ~ 6 cm and extending into the superior mediastinum with
tracheal mass effect. No tracheal narrowing or regional invasive
features. Patulous esophagus without apparent wall thickening. No
adenopathy

Lungs/Pleura: Trace pleural effusions. Atelectasis in the lower
lungs. No pneumothorax or consolidation.

Musculoskeletal: Healing anterior right fourth rib fracture. Diffuse
degenerative disc narrowing and endplate ridging.

CT ABDOMEN PELVIS FINDINGS

Hepatobiliary: No focal liver abnormality.No evidence of biliary
obstruction or stone.

Pancreas: Unremarkable.

Spleen: Unremarkable.

Adrenals/Urinary Tract: Negative adrenals. Marked right
hydroureteronephrosis due to a 8 x 5 mm stone just before the right
UVJ. There are 2 additional large right renal calculi measuring up
to 12 mm. Atrophic left kidney. Unremarkable bladder.

Stomach/Bowel: No obstruction. Extensive distal colonic
diverticulosis. No evidence of bowel inflammation.

Vascular/Lymphatic: Diffuse atherosclerotic calcification. No mass
or adenopathy.

Reproductive:No pathologic findings.

Other: No ascites or pneumoperitoneum.

Musculoskeletal: Advanced lumbar spine degeneration with mild
levoscoliosis.
IMPRESSION: 1. High-grade urinary obstruction on the right due to a 8 x 5 mm
stone just before the UVJ.
2. Two additional large right renal calculi measuring up to 12 mm.
3. History of respiratory illness but only minor atelectasis and
trace pleural fluid. Negative for pneumonia or edema.
4. Nodular goiter with mediastinal extension and tracheal mass
effect.
5. Gallstones or sludge.

## 2021-08-12 IMAGING — DX DG CHEST 1V PORT
1 series · 1 of 1 positions shown · non-contrast
Comparison: Radiograph 10/18/2018

CLINICAL DATA: Sepsis

EXAM:
PORTABLE CHEST 1 VIEW

[chest ap]
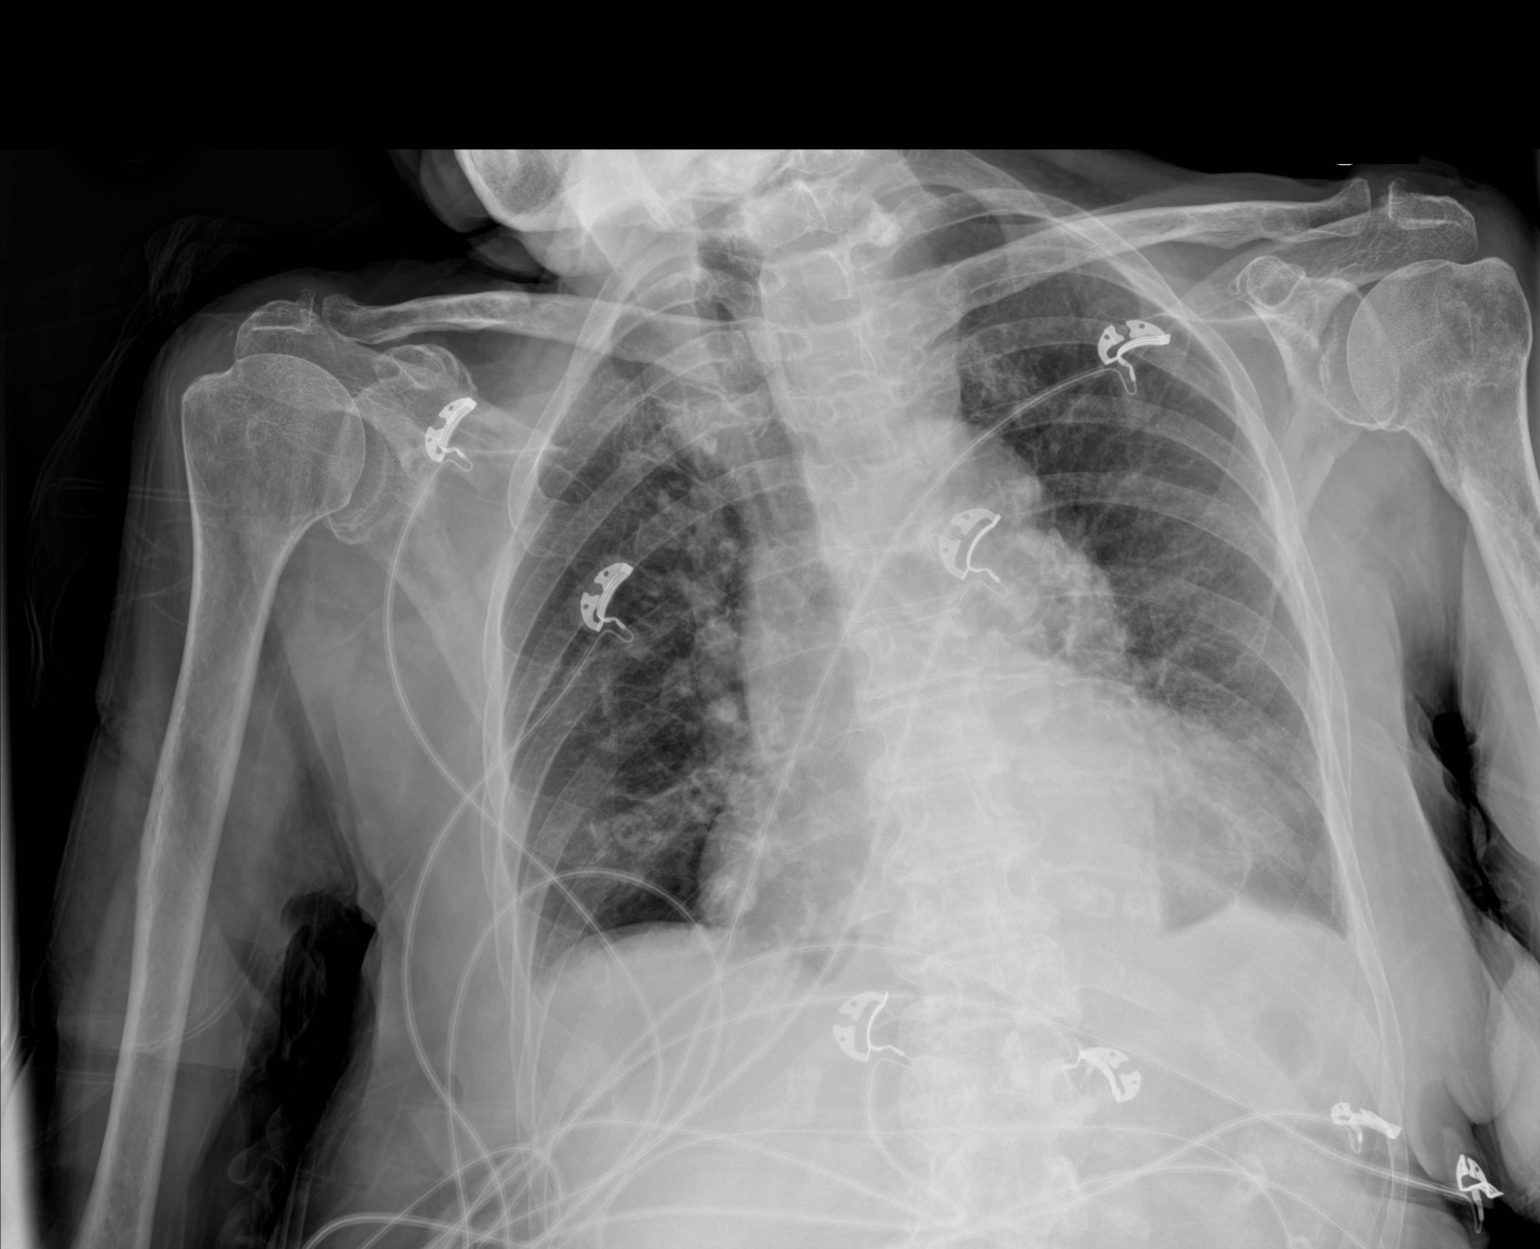

[1 of 1 positions shown; findings below may reference images not displayed]

FINDINGS: Mixed interstitial and patchy opacities in both lungs. Suspect a
trace left effusion. No pneumothorax. No right effusion.
Cardiomegaly with a calcified tortuous aorta is similar to prior.
Degenerative changes are present in the imaged spine and shoulders.
The osseous structures appear diffusely demineralized which may
limit detection of small or nondisplaced fractures. No acute osseous
or soft tissue abnormality. Remote posttraumatic deformity left
humerus. Telemetry leads overlie the chest.
IMPRESSION: Mixed interstitial and patchy opacities in both lungs and likely
trace left effusion. Given the basilar predominance and patient
history, findings could reflect aspiration or infection.
